# Patient Record
Sex: Male | Born: 1935
Health system: Southern US, Community
[De-identification: ages and names within clinical notes are randomized; demographics above are authoritative.]

## PROBLEM LIST (undated history)

## (undated) DIAGNOSIS — K219 Gastro-esophageal reflux disease without esophagitis: Secondary | ICD-10-CM

## (undated) DIAGNOSIS — F32A Depression, unspecified: Secondary | ICD-10-CM

## (undated) DIAGNOSIS — E039 Hypothyroidism, unspecified: Secondary | ICD-10-CM

## (undated) DIAGNOSIS — H269 Unspecified cataract: Secondary | ICD-10-CM

## (undated) DIAGNOSIS — F329 Major depressive disorder, single episode, unspecified: Secondary | ICD-10-CM

## (undated) DIAGNOSIS — Z7189 Other specified counseling: Secondary | ICD-10-CM

## (undated) DIAGNOSIS — E785 Hyperlipidemia, unspecified: Secondary | ICD-10-CM

## (undated) DIAGNOSIS — C73 Malignant neoplasm of thyroid gland: Secondary | ICD-10-CM

## (undated) HISTORY — PX: OTHER SURGICAL HISTORY: SHX169

## (undated) HISTORY — DX: Major depressive disorder, single episode, unspecified: F32.9

## (undated) HISTORY — DX: Unspecified cataract: H26.9

## (undated) HISTORY — PX: COLONOSCOPY W/ POLYPECTOMY: SHX1380

## (undated) HISTORY — DX: Hyperlipidemia, unspecified: E78.5

## (undated) HISTORY — PX: CATARACT EXTRACTION, BILATERAL: SHX1313

## (undated) HISTORY — DX: Other specified counseling: Z71.89

## (undated) HISTORY — DX: Malignant neoplasm of thyroid gland: C73

---

## 1998-06-12 ENCOUNTER — Ambulatory Visit (HOSPITAL_COMMUNITY): Admission: RE | Admit: 1998-06-12 | Discharge: 1998-06-12 | Payer: Self-pay | Admitting: Internal Medicine

## 1999-12-08 HISTORY — PX: THYROIDECTOMY: SHX17

## 2000-02-02 ENCOUNTER — Ambulatory Visit (HOSPITAL_COMMUNITY): Admission: RE | Admit: 2000-02-02 | Discharge: 2000-02-02 | Payer: Self-pay | Admitting: Internal Medicine

## 2000-02-02 ENCOUNTER — Encounter: Payer: Self-pay | Admitting: Internal Medicine

## 2000-02-05 ENCOUNTER — Encounter (INDEPENDENT_AMBULATORY_CARE_PROVIDER_SITE_OTHER): Payer: Self-pay | Admitting: Specialist

## 2000-02-05 ENCOUNTER — Ambulatory Visit (HOSPITAL_COMMUNITY): Admission: RE | Admit: 2000-02-05 | Discharge: 2000-02-05 | Payer: Self-pay | Admitting: Internal Medicine

## 2000-02-05 ENCOUNTER — Encounter: Payer: Self-pay | Admitting: Internal Medicine

## 2000-02-20 ENCOUNTER — Ambulatory Visit (HOSPITAL_COMMUNITY): Admission: RE | Admit: 2000-02-20 | Discharge: 2000-02-20 | Payer: Self-pay

## 2000-02-24 ENCOUNTER — Encounter (INDEPENDENT_AMBULATORY_CARE_PROVIDER_SITE_OTHER): Payer: Self-pay | Admitting: Specialist

## 2000-02-25 ENCOUNTER — Inpatient Hospital Stay (HOSPITAL_COMMUNITY): Admission: AD | Admit: 2000-02-25 | Discharge: 2000-02-26 | Payer: Self-pay

## 2000-04-23 ENCOUNTER — Ambulatory Visit (HOSPITAL_COMMUNITY): Admission: RE | Admit: 2000-04-23 | Discharge: 2000-04-23 | Payer: Self-pay | Admitting: Internal Medicine

## 2000-04-23 ENCOUNTER — Encounter (HOSPITAL_BASED_OUTPATIENT_CLINIC_OR_DEPARTMENT_OTHER): Payer: Self-pay | Admitting: Internal Medicine

## 2000-04-27 ENCOUNTER — Encounter (HOSPITAL_BASED_OUTPATIENT_CLINIC_OR_DEPARTMENT_OTHER): Payer: Self-pay | Admitting: Internal Medicine

## 2000-04-27 ENCOUNTER — Ambulatory Visit (HOSPITAL_COMMUNITY): Admission: RE | Admit: 2000-04-27 | Discharge: 2000-04-27 | Payer: Self-pay | Admitting: Internal Medicine

## 2001-08-30 ENCOUNTER — Encounter (INDEPENDENT_AMBULATORY_CARE_PROVIDER_SITE_OTHER): Payer: Self-pay | Admitting: *Deleted

## 2001-08-30 ENCOUNTER — Ambulatory Visit (HOSPITAL_COMMUNITY): Admission: RE | Admit: 2001-08-30 | Discharge: 2001-08-30 | Payer: Self-pay | Admitting: Gastroenterology

## 2004-08-28 ENCOUNTER — Encounter: Payer: Self-pay | Admitting: Internal Medicine

## 2005-06-02 ENCOUNTER — Ambulatory Visit: Payer: Self-pay | Admitting: Internal Medicine

## 2005-06-05 ENCOUNTER — Ambulatory Visit: Payer: Self-pay | Admitting: Internal Medicine

## 2005-09-25 ENCOUNTER — Ambulatory Visit: Payer: Self-pay | Admitting: Internal Medicine

## 2005-10-09 ENCOUNTER — Ambulatory Visit: Payer: Self-pay | Admitting: Internal Medicine

## 2005-12-21 ENCOUNTER — Ambulatory Visit: Payer: Self-pay | Admitting: Internal Medicine

## 2006-06-14 ENCOUNTER — Ambulatory Visit: Payer: Self-pay | Admitting: Internal Medicine

## 2006-06-21 ENCOUNTER — Ambulatory Visit: Payer: Self-pay | Admitting: Internal Medicine

## 2006-12-13 ENCOUNTER — Ambulatory Visit: Payer: Self-pay | Admitting: Internal Medicine

## 2006-12-13 LAB — CONVERTED CEMR LAB
PSA: 1.16 ng/mL (ref 0.10–4.00)
TSH: 0.05 microintl units/mL — ABNORMAL LOW (ref 0.35–5.50)

## 2006-12-23 ENCOUNTER — Encounter: Payer: Self-pay | Admitting: Internal Medicine

## 2006-12-27 ENCOUNTER — Ambulatory Visit: Payer: Self-pay | Admitting: Internal Medicine

## 2007-06-22 ENCOUNTER — Ambulatory Visit: Payer: Self-pay | Admitting: Internal Medicine

## 2007-06-30 ENCOUNTER — Encounter (INDEPENDENT_AMBULATORY_CARE_PROVIDER_SITE_OTHER): Payer: Self-pay | Admitting: *Deleted

## 2007-06-30 LAB — CONVERTED CEMR LAB
ALT: 27 units/L (ref 0–53)
AST: 31 units/L (ref 0–37)
Cholesterol: 120 mg/dL (ref 0–200)
Glucose, Bld: 109 mg/dL — ABNORMAL HIGH (ref 70–99)
HDL: 35.9 mg/dL — ABNORMAL LOW (ref >39.0)
LDL Cholesterol: 68 mg/dL (ref 0–99)
TSH: 0.03 microintl units/mL — ABNORMAL LOW (ref 0.35–5.50)
Total CHOL/HDL Ratio: 3.3
Triglycerides: 83 mg/dL (ref 0–149)
VLDL: 17 mg/dL (ref 0–40)

## 2007-10-25 ENCOUNTER — Telehealth (INDEPENDENT_AMBULATORY_CARE_PROVIDER_SITE_OTHER): Payer: Self-pay | Admitting: *Deleted

## 2007-11-11 ENCOUNTER — Ambulatory Visit: Payer: Self-pay | Admitting: Internal Medicine

## 2007-11-13 LAB — CONVERTED CEMR LAB
ALT: 32 units/L (ref 0–53)
AST: 35 units/L (ref 0–37)
Glucose, Bld: 117 mg/dL — ABNORMAL HIGH (ref 70–99)
TSH: 0.05 microintl units/mL — ABNORMAL LOW (ref 0.35–5.50)

## 2007-11-14 ENCOUNTER — Encounter (INDEPENDENT_AMBULATORY_CARE_PROVIDER_SITE_OTHER): Payer: Self-pay | Admitting: *Deleted

## 2007-11-21 ENCOUNTER — Ambulatory Visit: Payer: Self-pay | Admitting: Internal Medicine

## 2007-11-21 DIAGNOSIS — C73 Malignant neoplasm of thyroid gland: Secondary | ICD-10-CM | POA: Insufficient documentation

## 2007-11-21 DIAGNOSIS — E785 Hyperlipidemia, unspecified: Secondary | ICD-10-CM | POA: Insufficient documentation

## 2008-03-05 ENCOUNTER — Telehealth: Payer: Self-pay | Admitting: Internal Medicine

## 2008-03-05 ENCOUNTER — Telehealth (INDEPENDENT_AMBULATORY_CARE_PROVIDER_SITE_OTHER): Payer: Self-pay | Admitting: *Deleted

## 2008-03-06 ENCOUNTER — Telehealth (INDEPENDENT_AMBULATORY_CARE_PROVIDER_SITE_OTHER): Payer: Self-pay | Admitting: *Deleted

## 2008-05-14 ENCOUNTER — Ambulatory Visit: Payer: Self-pay | Admitting: Internal Medicine

## 2008-05-14 LAB — CONVERTED CEMR LAB
Bilirubin Urine: NEGATIVE
Blood in Urine, dipstick: NEGATIVE
Glucose, Urine, Semiquant: NEGATIVE
Protein, U semiquant: NEGATIVE
Urobilinogen, UA: 0.2
pH: 5

## 2008-05-16 ENCOUNTER — Telehealth (INDEPENDENT_AMBULATORY_CARE_PROVIDER_SITE_OTHER): Payer: Self-pay | Admitting: *Deleted

## 2008-05-21 ENCOUNTER — Ambulatory Visit: Payer: Self-pay | Admitting: Internal Medicine

## 2008-05-21 LAB — CONVERTED CEMR LAB
AST: 26 units/L (ref 0–37)
Basophils Absolute: 0 10*3/uL (ref 0.0–0.1)
Basophils Relative: 0.8 % (ref 0.0–1.0)
Bilirubin, Direct: 0.1 mg/dL (ref 0.0–0.3)
Chloride: 105 meq/L (ref 96–112)
Cholesterol: 137 mg/dL (ref 0–200)
Creatinine, Ser: 0.9 mg/dL (ref 0.4–1.5)
Eosinophils Absolute: 0.2 10*3/uL (ref 0.0–0.7)
GFR calc non Af Amer: 88 mL/min
HDL goal, serum: 40 mg/dL
HDL: 33.5 mg/dL — ABNORMAL LOW (ref >39.0)
LDL Cholesterol: 83 mg/dL (ref 0–99)
MCHC: 34.6 g/dL (ref 30.0–36.0)
MCV: 93.9 fL (ref 78.0–100.0)
Neutrophils Relative %: 48.7 % (ref 43.0–77.0)
Platelets: 188 10*3/uL (ref 150–400)
Potassium: 4.4 meq/L (ref 3.5–5.1)
RDW: 12.3 % (ref 11.5–14.6)
Sodium: 143 meq/L (ref 135–145)
TSH: 0.02 microintl units/mL — ABNORMAL LOW (ref 0.35–5.50)
Total Bilirubin: 0.9 mg/dL (ref 0.3–1.2)
Triglycerides: 103 mg/dL (ref 0–149)
VLDL: 21 mg/dL (ref 0–40)

## 2008-06-19 ENCOUNTER — Telehealth (INDEPENDENT_AMBULATORY_CARE_PROVIDER_SITE_OTHER): Payer: Self-pay | Admitting: *Deleted

## 2008-07-23 ENCOUNTER — Ambulatory Visit: Payer: Self-pay | Admitting: Internal Medicine

## 2008-07-30 ENCOUNTER — Encounter (INDEPENDENT_AMBULATORY_CARE_PROVIDER_SITE_OTHER): Payer: Self-pay | Admitting: *Deleted

## 2008-07-30 LAB — CONVERTED CEMR LAB: Hgb A1c MFr Bld: 5.7 % (ref 4.6–6.0)

## 2008-12-28 ENCOUNTER — Encounter: Payer: Self-pay | Admitting: Internal Medicine

## 2008-12-28 ENCOUNTER — Ambulatory Visit: Payer: Self-pay | Admitting: Family Medicine

## 2009-01-14 ENCOUNTER — Encounter (INDEPENDENT_AMBULATORY_CARE_PROVIDER_SITE_OTHER): Payer: Self-pay | Admitting: *Deleted

## 2009-04-19 ENCOUNTER — Telehealth (INDEPENDENT_AMBULATORY_CARE_PROVIDER_SITE_OTHER): Payer: Self-pay | Admitting: *Deleted

## 2009-05-09 ENCOUNTER — Ambulatory Visit: Payer: Self-pay | Admitting: Internal Medicine

## 2009-05-09 LAB — CONVERTED CEMR LAB
Glucose, Urine, Semiquant: NEGATIVE
Protein, U semiquant: 30
Urobilinogen, UA: 0.2
WBC Urine, dipstick: NEGATIVE

## 2009-05-13 ENCOUNTER — Ambulatory Visit: Payer: Self-pay | Admitting: Internal Medicine

## 2009-05-13 ENCOUNTER — Encounter (INDEPENDENT_AMBULATORY_CARE_PROVIDER_SITE_OTHER): Payer: Self-pay | Admitting: *Deleted

## 2009-05-13 LAB — CONVERTED CEMR LAB: OCCULT 3: NEGATIVE

## 2009-05-15 LAB — CONVERTED CEMR LAB
Albumin: 4.1 g/dL (ref 3.5–5.2)
Alkaline Phosphatase: 31 units/L — ABNORMAL LOW (ref 39–117)
Basophils Absolute: 0 10*3/uL (ref 0.0–0.1)
Bilirubin, Direct: 0.1 mg/dL (ref 0.0–0.3)
Calcium: 9.3 mg/dL (ref 8.4–10.5)
GFR calc non Af Amer: 87.96 mL/min (ref >60)
HCT: 46.7 % (ref 39.0–52.0)
HDL: 35.7 mg/dL — ABNORMAL LOW (ref >39.00)
LDL Cholesterol: 79 mg/dL (ref 0–99)
Lymphs Abs: 1.8 10*3/uL (ref 0.7–4.0)
MCV: 95.5 fL (ref 78.0–100.0)
Monocytes Absolute: 0.6 10*3/uL (ref 0.1–1.0)
Platelets: 153 10*3/uL (ref 150.0–400.0)
RDW: 12.1 % (ref 11.5–14.6)
Sodium: 140 meq/L (ref 135–145)
Total CHOL/HDL Ratio: 4
Triglycerides: 149 mg/dL (ref 0.0–149.0)

## 2009-05-20 ENCOUNTER — Ambulatory Visit: Payer: Self-pay | Admitting: Internal Medicine

## 2009-05-20 ENCOUNTER — Telehealth: Payer: Self-pay | Admitting: Internal Medicine

## 2009-05-20 DIAGNOSIS — R9431 Abnormal electrocardiogram [ECG] [EKG]: Secondary | ICD-10-CM

## 2009-05-20 DIAGNOSIS — R7301 Impaired fasting glucose: Secondary | ICD-10-CM

## 2009-05-20 DIAGNOSIS — Z85850 Personal history of malignant neoplasm of thyroid: Secondary | ICD-10-CM

## 2009-05-20 DIAGNOSIS — Z86010 Personal history of colonic polyps: Secondary | ICD-10-CM

## 2009-05-21 ENCOUNTER — Encounter: Payer: Self-pay | Admitting: Internal Medicine

## 2009-05-22 ENCOUNTER — Ambulatory Visit: Payer: Self-pay | Admitting: Internal Medicine

## 2009-05-22 ENCOUNTER — Encounter (INDEPENDENT_AMBULATORY_CARE_PROVIDER_SITE_OTHER): Payer: Self-pay | Admitting: *Deleted

## 2009-05-27 ENCOUNTER — Encounter (INDEPENDENT_AMBULATORY_CARE_PROVIDER_SITE_OTHER): Payer: Self-pay | Admitting: *Deleted

## 2009-06-11 ENCOUNTER — Telehealth (INDEPENDENT_AMBULATORY_CARE_PROVIDER_SITE_OTHER): Payer: Self-pay | Admitting: *Deleted

## 2009-06-11 ENCOUNTER — Ambulatory Visit: Payer: Self-pay | Admitting: Internal Medicine

## 2009-06-12 ENCOUNTER — Encounter: Payer: Self-pay | Admitting: Cardiovascular Disease

## 2009-06-12 ENCOUNTER — Ambulatory Visit: Payer: Self-pay

## 2009-06-19 ENCOUNTER — Encounter (INDEPENDENT_AMBULATORY_CARE_PROVIDER_SITE_OTHER): Payer: Self-pay | Admitting: *Deleted

## 2009-07-10 ENCOUNTER — Ambulatory Visit: Payer: Self-pay | Admitting: Internal Medicine

## 2009-10-22 ENCOUNTER — Ambulatory Visit: Payer: Self-pay | Admitting: Internal Medicine

## 2009-10-23 ENCOUNTER — Encounter: Payer: Self-pay | Admitting: Internal Medicine

## 2009-11-22 ENCOUNTER — Encounter: Payer: Self-pay | Admitting: Internal Medicine

## 2009-12-09 ENCOUNTER — Encounter: Payer: Self-pay | Admitting: Internal Medicine

## 2009-12-23 ENCOUNTER — Encounter: Payer: Self-pay | Admitting: Internal Medicine

## 2009-12-27 ENCOUNTER — Encounter: Payer: Self-pay | Admitting: Internal Medicine

## 2010-05-27 ENCOUNTER — Telehealth (INDEPENDENT_AMBULATORY_CARE_PROVIDER_SITE_OTHER): Payer: Self-pay | Admitting: *Deleted

## 2010-06-13 ENCOUNTER — Ambulatory Visit: Payer: Self-pay | Admitting: Internal Medicine

## 2010-06-13 LAB — CONVERTED CEMR LAB
ALT: 25 units/L (ref 0–53)
AST: 31 units/L (ref 0–37)
BUN: 18 mg/dL (ref 6–23)
Basophils Relative: 0.7 % (ref 0.0–3.0)
Bilirubin Urine: NEGATIVE
CO2: 25 meq/L (ref 19–32)
Chloride: 109 meq/L (ref 96–112)
Cholesterol: 182 mg/dL (ref 0–200)
Creatinine, Ser: 0.7 mg/dL (ref 0.4–1.5)
Eosinophils Absolute: 0.2 10*3/uL (ref 0.0–0.7)
Eosinophils Relative: 3.3 % (ref 0.0–5.0)
Glucose, Bld: 115 mg/dL — ABNORMAL HIGH (ref 70–99)
Glucose, Urine, Semiquant: NEGATIVE
Hemoglobin: 16.1 g/dL (ref 13.0–17.0)
Ketones, urine, test strip: NEGATIVE
Lymphocytes Relative: 38.2 % (ref 12.0–46.0)
MCHC: 34.3 g/dL (ref 30.0–36.0)
Monocytes Relative: 11.4 % (ref 3.0–12.0)
Neutro Abs: 2.3 10*3/uL (ref 1.4–7.7)
PSA: 1.22 ng/mL (ref 0.10–4.00)
RBC: 4.83 M/uL (ref 4.22–5.81)
Specific Gravity, Urine: 1.02
Total Protein: 6.8 g/dL (ref 6.0–8.3)
Triglycerides: 151 mg/dL — ABNORMAL HIGH (ref 0.0–149.0)
Urobilinogen, UA: 0.2
WBC: 5 10*3/uL (ref 4.5–10.5)

## 2010-06-17 ENCOUNTER — Encounter: Payer: Self-pay | Admitting: Internal Medicine

## 2010-06-18 ENCOUNTER — Encounter (INDEPENDENT_AMBULATORY_CARE_PROVIDER_SITE_OTHER): Payer: Self-pay | Admitting: *Deleted

## 2010-06-18 ENCOUNTER — Ambulatory Visit: Payer: Self-pay | Admitting: Internal Medicine

## 2010-06-18 LAB — CONVERTED CEMR LAB
OCCULT 1: NEGATIVE
OCCULT 3: NEGATIVE

## 2010-06-20 ENCOUNTER — Ambulatory Visit: Payer: Self-pay | Admitting: Internal Medicine

## 2011-01-08 NOTE — Progress Notes (Signed)
Summary: lab [phone]  Phone Note Call from Patient   Summary of Call: patient is scheduled for cpx [id number] - lab [phone] - need lab order .Marland KitchenOkey Regal Spring  May 27, 2010 9:34 AM   Follow-up for Phone Call         CBCD,BMP,HEP,LIPID,TSH,PSA,STOOL CARDS, U-DIP V70.0,272.4,995.20  Same as listed in phone note from 04/19/2009 (requesting labs order for CPX labs)  **Make sure patient is aware that if he only has MEDICARE he risk being charged for having labs prior to appointment** Follow-up by: Shonna Chock,  May 27, 2010 10:52 AM  Additional Follow-up for Phone Call Additional follow up Details #1::        labb order added to appt .Marland KitchenOkey Regal Spring  May 27, 2010 11:29 AM

## 2011-01-08 NOTE — Letter (Signed)
Summary: [facility]    Imported By: Lanelle Bal 01/06/2010 11:31:55  _____________________________________________________________________  External Attachment:    Type:   Image     Comment:   External Document

## 2011-01-08 NOTE — Assessment & Plan Note (Signed)
Summary: yearly check/cbs   Vital Signs:  Patient profile:   77 year old male Height:      70.75 inches Weight:      210 pounds BMI:     29.60 Temp:     98.3 degrees F oral Pulse rate:   68 / minute Resp:     14 per minute BP sitting:   122 / 74  (left arm) Cuff size:   large  Vitals Entered By: Shonna Chock CMA (June 20, 2010 2:06 PM) CC: Yearly, Lipid Management Comments **Patient refused to updat vaccines: TDap and Pneumonia was recommended**   CC:  Yearly and Lipid Management.  History of Present Illness: Here for Medicare AWV: 1.Risk factors based on Past M, S, F history:PMH of thyroid cancer;dyslipidemia. Chart updated & labs reviewed 2.Physical Activities: see Entry data 3.Depression/mood:denied  4.Hearing:whisper @ 6 ft 5.ADL's: no limitations 6.Fall Risk: none 7.Home Safety: no issues 8.Height, weight, &visual acuity:wall chart read w/o lenses; seen annually for unilateral Macular Degeneration 9.Counseling:none requested  10.Labs ordered based on risk factors: labs reviewed 11.Referral Coordination: none requested 12 Care Plan: see Instructions 13.Cognitive Assessment: oriented X 3, normal memory & recall; serial subtraction of 7 normal Hyperlipidemia Follow-Up      He also  presents for Hyperlipidemia follow-up.  Compliance with medications (by patient report) has been near 100%.  Dietary compliance has been good.  Adjunctive measures currently used by the patient include ASA.                                                                                                                                   Complete ROS for thyroid issues reviewed; PMH of thyroid cancer  Lipid Management History:      Positive NCEP/ATP III risk factors include male age 33 years old or older and HDL cholesterol less than 40.  Negative NCEP/ATP III risk factors include non-diabetic, no family history for ischemic heart disease, non-tobacco-user status, non-hypertensive, no ASHD  (atherosclerotic heart disease), no prior stroke/TIA, and no history of aortic aneurysm.     Preventive Screening-Counseling & Management  Alcohol-Tobacco     Alcohol drinks/day: 1-2     Alcohol type: wine     Smoking Status: quit > 6 months     Year Quit: 1963  Caffeine-Diet-Exercise     Caffeine use/day: 2-4 cups     Diet Comments: none     Does Patient Exercise: yes     Type of exercise: walking     Exercise (avg: min/session): >60     Times/week: <3  Hep-HIV-STD-Contraception     Dental Visit-last 6 months yes     Sun Exposure-Excessive: no  Safety-Violence-Falls     Seat Belt Use: yes     Firearms in the Home: firearms in the home     Firearm Counseling: he has been trained     Psychologist, sport and exercise: yes     Violence in  the Home: no risk noted     Sexual Abuse: no     Fall Risk: none      Sexual History:  currently monogamous.        Drug Use:  never.        Blood Transfusions:  no.        Travel History:  no foreign travel.    Allergies (verified): No Known Drug Allergies  Past History:  Past Medical History: elevated liver enzymes on viamin  A, PMH of   Thyroid Cancer  2001;Annual TSH goal = approx  0.2 for adequate suppression Annual thyroglolbulin antibodies Hyperlipidemia: NMR 08/2004: LDL 181(2753/2118), HDL 25,TG 166. LDL goal =< 100. Fr[place]ham Study LDL goal = < 130. Colonic polyps, hx of Abnormal EKG 2010; negative Nuclear Stress Test 06/2009  Past Surgical History: thyroid cancer, thyroidectomy 2001; S/P RAI fractures  of maxilla  post trauma(fall) colonoscopy polyps, tics09/2002, 08/2004; 2008 , Dr Kinnie Scales (due 2013) fracture of arm as child  Family History: Father: ? bladder cancer, CAD, angioplasty Mother: [place],HTN Siblings: bro: bladder tumor  Social History: Smoking Status:  quit > 6 months Caffeine use/day:  2-4 cups Dental Care w/in 6 mos.:  yes Sun Exposure-Excessive:  no Seat Belt Use:  yes Fall Risk:  none Blood  Transfusions:  no Drug Use:  never Sexual History:  currently monogamous  Review of Systems General:  Complains of sleep disorder; denies chills, fever, sweats, and weight loss; Some difficulty going to sleep. Sleep Hygiene reviewed.. Eyes:  Denies blurring, double vision, and vision loss-both eyes. ENT:  Denies difficulty swallowing, hoarseness, and sore throat. CV:  Denies chest pain or discomfort, leg cramps with exertion, palpitations, swelling of feet, and swelling of hands. Resp:  Denies cough, excessive snoring, hypersomnolence, morning headaches, shortness of breath, and sputum productive. GI:  Denies abdominal pain, bloody stools, dark tarry stools, and indigestion. GU:  Denies discharge, dysuria, and hematuria. MS:  Denies low back pain, mid back pain, and thoracic pain. Derm:  Denies changes in nail beds, dryness, and hair loss. Neuro:  Denies numbness and tingling. Psych:  Denies anxiety, depression, easily angered, easily tearful, and irritability. Endo:  Denies cold intolerance, excessive hunger, excessive thirst, excessive urination, and heat intolerance. Heme:  Denies abnormal bruising and bleeding. Allergy:  Denies itching eyes and sneezing.  Physical Exam  General:  Appears younger than age,well-nourished; alert,appropriate and cooperative throughout examination Head:  Normocephalic and atraumatic without obvious abnormalities. No apparent alopecia Eyes:  No corneal or conjunctival inflammation noted.No lid lag.Perrla. Funduscopic exam benign, without hemorrhages, exudates or papilledema. Vision grossly normal. Ears:  External ear exam shows no significant lesions or deformities.  Otoscopic examination reveals clear canals, tympanic membranes are intact bilaterally without bulging, retraction, inflammation or discharge. Hearing is grossly normal bilaterally. Nose:  External nasal examination shows no deformity or inflammation. Nasal mucosa are pink and moist without  lesions or exudates. Mouth:  Oral mucosa and oropharynx without lesions or exudates.  Teeth in good repair. Neck:  No deformities, masses, or tenderness noted.Thyroid surgically absent Lungs:  Normal respiratory effort, chest expands symmetrically. Lungs are clear to auscultation, no crackles or wheezes. Heart:  Normal rate and regular rhythm. S1 and S2 normal without gallop, murmur, click, rub .S4 Abdomen:  Bowel sounds positive,abdomen soft and non-tender without masses, organomegaly or hernias noted. Rectal:  No external abnormalities noted. Normal sphincter tone. No rectal masses or tenderness. Genitalia:  Testes bilaterally descended without nodularity, tenderness or masses. No scrotal masses  or lesions. No penis lesions or urethral discharge. L varicocele.   Prostate:  Prostate gland firm and smooth, no enlargement, nodularity, tenderness, mass, asymmetry or induration. Pulses:  R and L carotid,radial,dorsalis pedis and posterior tibial pulses are full and equal bilaterally Extremities:  No clubbing, cyanosis, edema, or deformity noted with normal full range of motion of all joints.   Neurologic:  alert & oriented X3, gait normal, and DTRs symmetrical and normal.   Skin:  Rash over buttocks Cervical Nodes:  No lymphadenopathy noted Axillary Nodes:  No palpable lymphadenopathy Psych:  memory intact for recent and remote, normally interactive, good eye contact, not anxious appearing, and not depressed appearing.     Impression & Recommendations:  Problem # 1:  PREVENTIVE HEALTH CARE (ICD-V70.0)  Orders: Subsequent annual wellness visit with prevention plan (V4259)  Problem # 2:  THYROID CANCER, HX OF (ICD-V10.87)  Problem # 3:  HYPERLIPIDEMIA (ICD-272.4)  His updated medication list for this problem includes:    Crestor 20 Mg Tabs (Rosuvastatin calcium) .Marland Kitchen... 1 qd  Orders: EKG w/ Interpretation (93000)  Problem # 4:  NONSPECIFIC ABNORMAL ELECTROCARDIOGRAM  (ICD-794.31)  Orders: EKG w/ Interpretation (93000): stable EKG ; clinically asymptomatic;negative Nuclear Stress Test,PMH of  Problem # 5:  COLONIC POLYPS, HX OF (ICD-V12.72) as per Dr Kinnie Scales  Complete Medication List: 1)  Synthroid 150 Mcg Tabs (Levothyroxine sodium) .Marland Kitchen.. 1 by mouth once daily except none on sat 2)  Basa  .... Qd 3)  Crestor 20 Mg Tabs (Rosuvastatin calcium) .Marland Kitchen.. 1 qd  Lipid Assessment/Plan:      Based on NCEP/ATP III, the patient's risk factor category is "0-1 risk factors".  The patient's lipid goals are as follows: Total cholesterol goal is 200; LDL cholesterol goal is 90; HDL cholesterol goal is 40; Triglyceride goal is 150.  His LDL cholesterol goal has been met.    Patient Instructions: 1)  It is important that you exercise regularly at least 20 minutes 5 times a week. If you develop chest pain, have severe difficulty breathing, or feel very tired , stop exercising immediately and seek medical attention.Take an Aspirin every day. Thyroglobulin antibodies need to be done annually with TSH. Prescriptions: CRESTOR 20 MG  TABS (ROSUVASTATIN CALCIUM) 1 qd  #90 x 3   Entered and Authorized by:   Marga Melnick MD   Signed by:   Marga Melnick MD on 06/20/2010   Method used:   Print then Give to Patient   RxID:   [id number] SYNTHROID 150 MCG  TABS (LEVOTHYROXINE SODIUM) 1 by mouth once daily EXCEPT none on Sat  #90 x 3   Entered and Authorized by:   Marga Melnick MD   Signed by:   Marga Melnick MD on 06/20/2010   Method used:   Print then Give to Patient   RxID:   [id number]

## 2011-01-08 NOTE — Letter (Signed)
Summary: [facility] [place]   Imported By: Lanelle Bal 12/19/2009 09:05:07  _____________________________________________________________________  External Attachment:    Type:   Image     Comment:   External Document

## 2011-01-08 NOTE — Progress Notes (Signed)
Summary: lab appt -[id number]  Phone Note Call from Patient Call back at Home Phone [phone]   Caller: Patient Summary of Call: need lab order - patient wife said patient gets labs once a year ---  Initial call taken by: Okey Regal Spring,  Apr 19, 2009 4:22 PM  Follow-up for Phone Call        ** PLEASE HAVE PATIENT SCHEDULE YEARLY CHECK-UP**  Lab Order: Dean Pruitt, U-DIP V70.0,272.4,995.20 Dean Pruitt  Apr 22, 2009 9:19 AM   Additional Follow-up for Phone Call Additional follow up Details #1::        when i spoke with wife i told her patient needed to schedule cpx -  she said no he just gets labs -  called to schedule lab appt spoke with patient he also said he didnt need cpx -  lab is scheduled [id number] - cpx [id number] Additional Follow-up by: Okey Regal Spring,  Apr 22, 2009 3:00 PM

## 2011-01-08 NOTE — Letter (Signed)
Summary: [facility] [place]   Imported By: Lanelle Bal 01/06/2010 11:32:42  _____________________________________________________________________  External Attachment:    Type:   Image     Comment:   External Document

## 2011-01-08 NOTE — Letter (Signed)
Summary: Results Follow up Letter  [facility] [place]  [address] [place], [place] [address]   Phone: [phone]  Fax: [phone]    06/18/2010 MRN: [id number]  Dean Pruitt [address] [place], [place]  [address]  Dear Dean Pruitt,  The following are the results of your recent test(s):  Test         Result    Pap Smear:        Normal _____  Not Normal _____ Comments: ______________________________________________________ Cholesterol: LDL(Bad cholesterol):         Your goal is less than:         HDL (Good cholesterol):       Your goal is more than: Comments:  ______________________________________________________ Mammogram:        Normal _____  Not Normal _____ Comments:  ___________________________________________________________________ Hemoccult:        Normal _X____  Not normal _______ Comments:    _____________________________________________________________________ Other Tests:    We routinely do not discuss normal results over the telephone.  If you desire a copy of the results, or you have any questions about this information we can discuss them at your next office visit.   Sincerely,

## 2011-04-24 NOTE — Discharge Summary (Signed)
[place]. [facility]  Patient:    Dean Pruitt                      MRN: [id number] Adm. Date:  [id number] Disc. Date: [id number] Attending:  Gennie Alma CC:         Titus Dubin. Alwyn Ren, M.D. [facility]             Robert E. Cheryll Cockayne, M.D.                           Discharge Summary  FINAL DIAGNOSES: 1. 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 2. Intraoperative devascularization of left superior parathyroid gland. 3. Hematoma of neck due to postoperative bleeding, postoperative complication.  HISTORY OF PRESENT ILLNESS:  This 77 year old male patient, who was in excellent health, recently had noticed a nodule on the left lobe of his thyroid when he was palpating his own neck.  He was seen by Dr. Alwyn Ren.  An ultrasound scan of the thyroid revealed two nodules of the left lobe of the thyroid, and radioactive scan revealed the nodules to be cold nodules.  They were biopsied by fine needle aspirate, and this revealed atypical cells compatible with papillary carcinoma of the thyroid.  Physical examination of the neck revealed a hard nodule in the left lobe of the thyroid in the lower pole that felt like it was approximately 1.5 cm in diameter.  HOSPITAL COURSE:  On the day of admission, the patient underwent a total thyroidectomy.  He also had an intraoperative devascularization of the left superior parathyroid gland in order to completely remove the left lobe of the thyroid.  The left superior parathyroid gland was autotransplanted to the left sternocleidomastoid muscle.  The patient tolerated the operation very well.  However, in the 24 hours postoperatively, he developed a large hematoma of his neck which increased in size and began to have airway obstruction.  He was taken back to the operating room emergently on February 25, 2000, for evacuation of a large neck hematoma and re-exploration of the thyroidectomy wound.  He made a dramatic  recovery after that procedure, and was able to be discharged on the next day, at which time there was no further bleeding, and the patient was feeling much better.  His voice was husky, but not hoarse.  He was discharged to further care as an outpatient.  CONDITION ON DISCHARGE:  Much improved.  MEDICATIONS: 1. Tylox 1 every six hours p.r.n. for pain. 2. Tums 500 q.i.d. 3. He also was given a prescription for Synthroid 0.125 mg daily.  FOLLOW-UP:  He was to return to our office on March 01, 2000, for further follow-up. DD:  03/26/00 TD:  03/27/00 Job: [id number]

## 2011-04-24 NOTE — Op Note (Signed)
[place]. [facility]  Patient:    Dean Pruitt, Dean Pruitt                      MRN: [id number] Proc. Date: 02/25/00 Adm. Date:  [id number] Disc. Date: [id number] Attending:  Gennie Alma CC:         Titus Dubin. Alwyn Ren, M.D. [facility]                           Operative Report  CCS# [id number]  PREOPERATIVE DIAGNOSIS:  Hematoma of neck wound, status post total thyroidectomy.  POSTOPERATIVE DIAGNOSIS:  Hematoma of neck wound, status post total thyroidectomy.  OPERATION PERFORMED:  Evacuation of hematoma of neck, re-exploration thyroidectomy wound.  SURGEON:  Milus Mallick, M.D.  ASSISTANT:  Dr. Ezzard Standing.  ANESTHESIA:  General endotracheal.  INDICATIONS FOR PROCEDURE:  This 77 year old male patient underwent a total thyroidectomy yesterday for a T3 carcinoma of the left lobe of the thyroid. The patient tolerated the surgery well and when seen on daily rounds this morning, he patient was complaining of difficulty in swallowing.  He had a mild to moderate  hematoma but no compromise of his breathing and appeared to be quite stable. Over the course of the morning and early afternoon, he developed swelling of his neck and began to have sensation of difficulty breathing.  He was therefore taken emergently to the operating room.  DESCRIPTION OF PROCEDURE:  The patient was brought to the operating room and his neck was prepared and draped.  He was kept in an upright position.  Under local  anesthesia with 1% Xylocaine with 1:200,000 parts of epinephrine, approximately  10 cc, the skin staples were removed from a low collar incision.  Next the sutures were removed from the platysma layer thereby exposing the middle cervical fascia. The middle cervical fascia was tense and straining to disrupt due to increased pressure in the visceral compartment of the neck.  The strap muscles were sutured in the midline.  These sutures were removed at which time, there was  an outpouring of clot and immediate relief of the patient in terms of his ability to breathe. At this point satisfactory general endotracheal anesthesia was initiated.  The patients neck was reprepared and draped in the usual fashion for thyroidectomy nd surgeon assistant scrubbed and gowned.  The strap muscles distracted and the left side was approached first.  Multiple clots were evacuated.  There was an area of active bleeding adjacent to the left side of the trachea and was probably a branch of the inferior thyroid artery.  It was clipped and controlled.  The left side f his neck was irrigated with sterile saline solution until clear.  Next, clots were evacuated from the right paratracheal space and there was no active bleeding on the right side. The neck was copiously irrigated and observed over 10 to 15 minute period and there was no bleeding that occurred.  A half-inch Penrose drain was bivalved and placed on either side of the trachea and brought out the center of the incision.  The strap muscles were approximated in the midline with interrupted sutures of 4-0 Vicryl.  The platysma layer was reapproximated with interrupted sutures of 4-0 Vicryl and the skin was reapproximated with the generic skin stapler 35-W.  Sterile dressings applied.  Estimated blood loss for this procedure was negligible but at least 200 to 300 c of clots were evacuated.  The patient  tolerated the procedure well and left the  operating room in satisfactory condition. DD:  02/25/00 TD:  02/26/00 Job: [id number]

## 2011-04-24 NOTE — Op Note (Signed)
[place]  Patient:    Dean Pruitt, Dean Pruitt                      MRN: [id number] Proc. Date: 02/24/00 Adm. Date:  [id number] Attending:  Gennie Alma CC:         Titus Dubin. Alwyn Ren, M.D. [facility]                           Operative Report  [facility] [place] NUMBER [phone]  PREOPERATIVE DIAGNOSIS:  Massive left lobe of thyroid, probable papillary carcinoma of thyroid.  POSTOPERATIVE DIAGNOSES: 1. Massive left lobe of thyroid, probable papillary carcinoma of thyroid. 2. Devascularized left superior parathyroid gland intraoperatively.  OPERATION:  Total thyroidectomy.  Auto transplantation of left superior parathyroid gland to the left sternocleidomastoid muscle.  SURGEON:  Milus Mallick, M.D.  ASSISTANT:  Rose Phi. Maple Hudson, M.D.  ANESTHESIA:  General endotracheal.  DESCRIPTION OF PROCEDURE:  Under adequate general endotracheal anesthesia, the patients neck was prepared and draped in the usual fashion after his neck was extended over a shoulder roll.  A low collar incision was made and carried down  through the platysma muscle. Superior and inferior platysmal flaps were developed exposing the middle cervical fascia.  A Mahorner self retaining retractor was inserted.  The middle cervical fascia was incised longitudinally in the midline and it was seen immediately that the patient had a large rock hard tumor in the left lobe of the thyroid that was invading the sternothyroid muscle.  The sternohyoid muscle was divided to expose the sternothyroid muscle.  The sternothyroid muscle then was divided above and below its area of attachment to the left lobe of the  thyroid.  The mass in the left lobe of the thyroid was approximately 3 cm in diameter.  Dissection was begun laterally.  The lower pole attachments of the left thyroid  lobe were divided over small hemoclips.  This allowed the lobe to be rotated medially and anteriorly.  The left  inferior parathyroid gland was identified and the dissection proceeded allowing the left inferior parathyroid gland to be vital. Next, the superior pole was approached and the superior pole vessels were controlled over ligatures of 2-0 silk and clip.  The stay side was left with a -0 silk ligature and a clip on it.  The specimen side had just a ligature on it. s we began to mobilize the left lobe of the thyroid, there was a very large area f adipose tissue that was attached to it that came up with it.  It was very unusual in appearance and we were concerned that it might have some lymph nodes in it.  When we explored it, we found a small normal appearing parathyroid gland.  This was devitalized however.  A small portion was sent for frozen section study which confirmed parathyroid tissue and the remainder was saved in saline.  The left recurrent laryngeal nerve was identified and the inferior thyroid artery was divided over small hemoclips taking care not to injure the recurrent laryngeal nerve.  The left lobe was then partially dissected off of the trachea but we found that the tumor appeared to be fairly adherent to the trachea in the superior portion.  For this reason, we then decided to do a total thyroidectomy and leave this portion of the thyroid dissection for the final maneuver when the area was  best visualized.  We then moved  to the right lobe of the thyroid and controlled the superior pole vessels with 2-0 silk ligature and clip.  The right inferior parathyroid gland was found also in a sea of adipose tissue and it was not devitalized.  The inferior pole attachments were divided over small clips.  The  right recurrent laryngeal nerve was identified and the lobe was dissected off of the trachea above it.  The right superior parathyroid gland was not definitely identified.  We then began the dissection of the entire thyroid gland off of the anterior aspect of the  trachea and this was done in some instances over small clips and then other instances just with Bovie electrocoagulation.  This was accomplished and the entire gland was removed from the operative field.  There was no evidence of tracheomalacia or injury of the anterior wall of the trachea that we could determine and it was completely clear of thyroid tissue at the completion of her surgery.  We saw no evidence of any enlarged lymph nodes.  The operative site was irrigated with sterile saline solution until clear.  A few small bleeding small veins were clipped with small hemoclips.  Hemostasis was ascertained.  The left sternohyoid muscle was repaired with interrupted horizontal mattress sutures of 4-0 Vicryl. It was then sutured to the right sternohyoid muscle with interrupted sutures of 4-0 Vicryl.  Next, the left superior parathyroid gland was diced into five small pieces which were inserted into an intramuscular pocket in the left sternocleidomastoid muscle. The pocket was closed with a figure-of-eight suture of 4-0 Vicryl.  The platysmal layer was reapproximated with interrupted sutures of 4-0 Vicryl and the skin was approximated with the generic skin stapler 35-W. Sterile dressing as applied.  Estimated blood loss for the procedure was approximately 200 cc.  The  patient tolerated the procedure well and left the operating room in satisfactory condition. DD:  02/24/00 TD:  02/25/00 Job: [id number]

## 2011-06-20 ENCOUNTER — Encounter: Payer: Self-pay | Admitting: Internal Medicine

## 2011-06-22 ENCOUNTER — Ambulatory Visit (INDEPENDENT_AMBULATORY_CARE_PROVIDER_SITE_OTHER): Payer: Medicare Other | Admitting: Internal Medicine

## 2011-06-22 ENCOUNTER — Encounter: Payer: Self-pay | Admitting: Internal Medicine

## 2011-06-22 VITALS — BP 126/78 | HR 57 | Temp 98.2°F | Resp 14 | Ht 71.25 in | Wt 202.2 lb

## 2011-06-22 DIAGNOSIS — Z85850 Personal history of malignant neoplasm of thyroid: Secondary | ICD-10-CM

## 2011-06-22 DIAGNOSIS — H3530 Unspecified macular degeneration: Secondary | ICD-10-CM

## 2011-06-22 DIAGNOSIS — E785 Hyperlipidemia, unspecified: Secondary | ICD-10-CM

## 2011-06-22 DIAGNOSIS — Z860100 Personal history of colon polyps, unspecified: Secondary | ICD-10-CM

## 2011-06-22 DIAGNOSIS — C73 Malignant neoplasm of thyroid gland: Secondary | ICD-10-CM

## 2011-06-22 DIAGNOSIS — R9431 Abnormal electrocardiogram [ECG] [EKG]: Secondary | ICD-10-CM

## 2011-06-22 DIAGNOSIS — Z0000 Encounter for general adult medical examination without abnormal findings: Secondary | ICD-10-CM

## 2011-06-22 LAB — BASIC METABOLIC PANEL WITH GFR
BUN: 20 mg/dL (ref 6–23)
CO2: 28 meq/L (ref 19–32)
Calcium: 9.3 mg/dL (ref 8.4–10.5)
Chloride: 100 meq/L (ref 96–112)
Creatinine, Ser: 0.9 mg/dL (ref 0.4–1.5)
GFR: 86.34 mL/min
Glucose, Bld: 110 mg/dL — ABNORMAL HIGH (ref 70–99)
Potassium: 4.5 meq/L (ref 3.5–5.1)
Sodium: 137 meq/L (ref 135–145)

## 2011-06-22 LAB — CBC WITH DIFFERENTIAL/PLATELET
Basophils Absolute: 0 10*3/uL (ref 0.0–0.1)
Eosinophils Absolute: 0.1 10*3/uL (ref 0.0–0.7)
Lymphocytes Relative: 32.7 % (ref 12.0–46.0)
MCHC: 33.9 g/dL (ref 30.0–36.0)
Neutrophils Relative %: 52.3 % (ref 43.0–77.0)
Platelets: 176 10*3/uL (ref 150.0–400.0)
RDW: 13.2 % (ref 11.5–14.6)

## 2011-06-22 LAB — HEPATIC FUNCTION PANEL
Alkaline Phosphatase: 42 U/L (ref 39–117)
Bilirubin, Direct: 0.1 mg/dL (ref 0.0–0.3)
Total Bilirubin: 1.5 mg/dL — ABNORMAL HIGH (ref 0.3–1.2)

## 2011-06-22 LAB — LIPID PANEL: Total CHOL/HDL Ratio: 4

## 2011-06-22 LAB — TSH: TSH: 0.14 u[IU]/mL — ABNORMAL LOW (ref 0.35–5.50)

## 2011-06-22 NOTE — Progress Notes (Signed)
Subjective:    Patient ID: Dean Pruitt, male    DOB: 15-May-1934, 77 y.o.   MRN: [id number]  HPI Medicare Wellness Visit:  The following psychosocial & medical history were reviewed as required by Medicare.   Social history: caffeine: 2 cups/ day , alcohol:  1 wine/ day ,  tobacco use : quit 1960  & exercise :2-3X / week < 30 min.   Home & personal  safety / fall risk:no issues , activities of daily living: no limitations , seatbelt use : yes , and smoke alarm employment : yes .  Power of Attorney/Living Will status : yes  Vision ( as recorded per Nurse) & Hearing  evaluation :  Wall chart read @ 6 ft;hearing slightly reduced to whisper on L. Orientation :oriented X3 , memory & recall :normal, spelling or math testing: good,and mood & affect : normal . Depression / anxiety: denied Travel history : [place] , immunization status :shingles needed , transfusion history:  no, and preventive health surveillance ( colonoscopies, BMD , etc as per protocol/ SOC): up to date, Dental care:  annually . Chart reviewed &  Updated. Active issues reviewed & addressed.       Review of Systems Patient reports no   hearing changes,anorexia, weight change, fever ,adenopathy, persistant / recurrent hoarseness, swallowing issues, chest pain,palpitations, edema,persistant / recurrent cough, hemoptysis, dyspnea(rest, exertional, paroxysmal nocturnal), gastrointestinal  bleeding (melena, rectal bleeding), abdominal pain, excessive heart burn, GU symptoms( dysuria, hematuria, pyuria, voiding/incontinence  issues) syncope, focal weakness, memory loss,numbness & tingling, skin/hair/nail changes, abnormal bruising/bleeding,or  musculoskeletal symptoms/signs.      Objective:   Physical Exam Gen.: Healthy and well-nourished in appearance. Alert, appropriate and cooperative throughout exam. Head: Normocephalic without obvious abnormalities;  no alopecia . Appears younger than age Eyes: No corneal or conjunctival  inflammation noted. Pupils equal round reactive to light and accommodation. Fundal exam is benign without hemorrhages, exudate, papilledema. Extraocular motion intact. Vision grossly normal. Ears: External  ear exam reveals no significant lesions or deformities. Canals clear .TMs normal. Hearing is grossly normal bilaterally. Nose: External nasal exam reveals no deformity or inflammation. Nasal mucosa are pink and moist. No lesions or exudates noted. Septum  :no deviation  Mouth: Oral mucosa and oropharynx reveal no lesions or exudates. Teeth in good repair. Neck: No deformities, masses, or tenderness noted. Range of motion normal. Thyroid  Surgically absent. Lungs: Normal respiratory effort; chest expands symmetrically. Lungs are clear to auscultation without rales, wheezes, or increased work of breathing. Heart:Slow rate and regular  rhythm. Normal S1 and S2. No gallop, click, or rub. S4 ; no murmur. Abdomen: Bowel sounds normal; abdomen soft and nontender. No masses, organomegaly or hernias noted. Genitalia/DRE: Varicocele suggested on the right. There is asymmetry of the prostate; the left lobe is small and the right is upper limits of normal. There is no induration or nodularity present.   .                                                                                   Musculoskeletal/extremities: No deformity or scoliosis noted of  the thoracic or lumbar spine. No clubbing, cyanosis, edema, or deformity  noted. Range of motion  normal .Tone & strength  normal.Joints normal. Nail health  good. Vascular: Carotid, radial artery, dorsalis pedis and  posterior tibial pulses are full and equal. No bruits present. Neurologic: Alert and oriented x3. Deep tendon reflexes symmetrical and normal.          Skin: Intact without suspicious lesions or rashes. Lymph: No cervical, axillary, or inguinal lymphadenopathy present. Psych: Mood and affect are normal. Normally interactive                                                                                          Assessment & Plan:  #1 Medicare Wellness Exam; criteria met ; data entered #2 Problem List reviewed ; Assessment/ Recommendations made Plan: see Orders

## 2011-06-22 NOTE — Patient Instructions (Signed)
Preventive Health Care: Exercise at least 30-45 minutes a day,  3-4 days a week.  Eat a low-fat diet with lots of fruits and vegetables, up to 7-9 servings per day. Consume less than 40 grams of sugar per day from foods & drinks with High Fructose Corn Sugar as #2,3 or # 4 on label.   

## 2011-06-23 LAB — HEMOGLOBIN A1C: Hgb A1c MFr Bld: 5.9 % (ref 4.6–6.5)

## 2011-06-23 LAB — THYROGLOBULIN LEVEL: Thyroglobulin: 3.8 ng/mL (ref 0.0–55.0)

## 2011-07-06 ENCOUNTER — Other Ambulatory Visit: Payer: Self-pay | Admitting: Internal Medicine

## 2011-07-06 MED ORDER — LEVOTHYROXINE SODIUM 150 MCG PO TABS: 150.0000 ug | ORAL_TABLET | Freq: Every day | ORAL | Status: DC

## 2011-07-09 ENCOUNTER — Other Ambulatory Visit: Payer: Self-pay | Admitting: *Deleted

## 2011-07-09 MED ORDER — ROSUVASTATIN CALCIUM 20 MG PO TABS: 20.0000 mg | ORAL_TABLET | Freq: Every day | ORAL | Status: DC

## 2011-09-03 ENCOUNTER — Telehealth: Payer: Self-pay | Admitting: Internal Medicine

## 2011-09-03 MED ORDER — DESONIDE 0.05 % EX LOTN: TOPICAL_LOTION | Freq: Two times a day (BID) | CUTANEOUS | Status: DC | PRN

## 2011-09-03 NOTE — Telephone Encounter (Signed)
Pt already made aware per Okey Regal that Rx sent in.

## 2011-09-03 NOTE — Telephone Encounter (Signed)
Patient has poison ivy - he wants rx for verdeso - [facility] - offered appt for this afternoon patient said he has too much to do today

## 2011-09-03 NOTE — Telephone Encounter (Signed)
Desonide lotion 0.05% 60 grams, apply bid prn

## 2011-11-23 ENCOUNTER — Other Ambulatory Visit: Payer: Self-pay | Admitting: Internal Medicine

## 2012-02-18 ENCOUNTER — Telehealth: Payer: Self-pay | Admitting: Internal Medicine

## 2012-02-18 MED ORDER — ZOLPIDEM TARTRATE 10 MG PO TABS: ORAL_TABLET | ORAL | Status: DC

## 2012-02-18 NOTE — Telephone Encounter (Signed)
#   10 , 1 every 3rd days prn only

## 2012-02-18 NOTE — Telephone Encounter (Signed)
Refill for Ambien 10MG  TAB [facility]O Last dispensed 7.15.11 No qty listed

## 2012-02-18 NOTE — Telephone Encounter (Signed)
Rx sent 

## 2012-02-18 NOTE — Telephone Encounter (Signed)
Dr.Hopper please advise, Ambien is not on med list (I checked centricity)

## 2012-06-22 ENCOUNTER — Encounter: Payer: Self-pay | Admitting: Internal Medicine

## 2012-06-22 ENCOUNTER — Ambulatory Visit (INDEPENDENT_AMBULATORY_CARE_PROVIDER_SITE_OTHER): Payer: Medicare Other | Admitting: Internal Medicine

## 2012-06-22 VITALS — BP 110/72 | HR 71 | Temp 97.8°F | Resp 12 | Ht 70.08 in | Wt 198.6 lb

## 2012-06-22 DIAGNOSIS — R9431 Abnormal electrocardiogram [ECG] [EKG]: Secondary | ICD-10-CM

## 2012-06-22 DIAGNOSIS — R7309 Other abnormal glucose: Secondary | ICD-10-CM

## 2012-06-22 DIAGNOSIS — Z86010 Personal history of colonic polyps: Secondary | ICD-10-CM

## 2012-06-22 DIAGNOSIS — Z85850 Personal history of malignant neoplasm of thyroid: Secondary | ICD-10-CM

## 2012-06-22 DIAGNOSIS — E785 Hyperlipidemia, unspecified: Secondary | ICD-10-CM

## 2012-06-22 DIAGNOSIS — Z0000 Encounter for general adult medical examination without abnormal findings: Secondary | ICD-10-CM

## 2012-06-22 LAB — HEPATIC FUNCTION PANEL
AST: 31 U/L (ref 0–37)
Albumin: 4.6 g/dL (ref 3.5–5.2)
Alkaline Phosphatase: 38 U/L — ABNORMAL LOW (ref 39–117)
Total Protein: 7.2 g/dL (ref 6.0–8.3)

## 2012-06-22 LAB — TSH: TSH: 0.07 u[IU]/mL — ABNORMAL LOW (ref 0.35–5.50)

## 2012-06-22 LAB — CBC WITH DIFFERENTIAL/PLATELET
Basophils Relative: 0.6 % (ref 0.0–3.0)
Eosinophils Absolute: 0.1 10*3/uL (ref 0.0–0.7)
HCT: 48.1 % (ref 39.0–52.0)
Hemoglobin: 15.9 g/dL (ref 13.0–17.0)
Lymphocytes Relative: 30.1 % (ref 12.0–46.0)
MCHC: 33 g/dL (ref 30.0–36.0)
Monocytes Relative: 12.1 % — ABNORMAL HIGH (ref 3.0–12.0)
Neutro Abs: 2.8 10*3/uL (ref 1.4–7.7)
RBC: 5.01 Mil/uL (ref 4.22–5.81)

## 2012-06-22 LAB — BASIC METABOLIC PANEL
CO2: 27 mEq/L (ref 19–32)
Calcium: 9.5 mg/dL (ref 8.4–10.5)
Potassium: 3.8 mEq/L (ref 3.5–5.1)
Sodium: 137 mEq/L (ref 135–145)

## 2012-06-22 LAB — HEMOGLOBIN A1C: Hgb A1c MFr Bld: 5.9 % (ref 4.6–6.5)

## 2012-06-22 NOTE — Patient Instructions (Addendum)
Preventive Health Care: Exercise at least 30-45 minutes a day,  3-4 days a week.  Eat a low-fat diet with lots of fruits and vegetables, up to 7-9 servings per day.  Consume less than 40 grams of sugar per day from foods & drinks with High Fructose Corn Sugar as #1,2,3 or # 4 on label. As per the Standard of Care , screening Colonoscopy recommended @ 50 & every 5-10 years thereafter . More frequent monitor would be dictated by family history or findings @ Colonoscopy . Please try to go on My Chart within the next 24 hours to allow me to release the results directly to you.

## 2012-06-22 NOTE — Progress Notes (Signed)
Subjective:    Patient ID: Dean Pruitt, male    DOB: [birth year], 78 y.o.   MRN: [id number]  HPI Medicare Wellness Visit:  The following psychosocial & medical history were reviewed as required by Medicare.   Social history: caffeine: 2-3 cups/ day of coffee , alcohol:  7 wine/ week ,  tobacco use : quit 1960  & exercise : yardwork ,golf.   Home & personal  safety / fall risk: no issues, activities of daily living: no limitations, seatbelt use : yes, and smoke alarm employment : yes .  Power of Attorney/Living Will status : in place  Vision ( as recorded per Nurse) & Hearing  evaluation : Ophth exam 7/15; cataract surgery 7/19.No hearing evaluation Orientation :oriented X 3 , memory & recall :good,  math testing: good,and mood & affect : normal . Depression / anxiety: denied Travel history : [place] 1999 , immunization status :Shingles needed , transfusion history:  no, and preventive health surveillance ( colonoscopies, BMD , etc as per protocol/ [facility]): colonoscopy ? Due 2013, Dental care:  Seen annually . Chart reviewed &  Updated. Active issues reviewed & addressed.      Review of Systems HYPERLIPIDEMIA: Medications: Compliance- yes Chest pain, palpitations-no       Dyspnea- no Lightheadedness,Syncope- no   Edema- no Abd pain, bowel changes-no   Muscle aches- no  FASTING HYPERGLYCEMIA, PMH of:  Polyuria/phagia/dipsia- no       Visual problems- only related to cataracts             Objective:   Physical Exam Gen.: Healthy and well-nourished in appearance. Alert, appropriate and cooperative throughout exam.Appears younger than stated age  Head: Normocephalic without obvious abnormalities  Eyes: No corneal or conjunctival inflammation noted. Pupils equal round reactive to light and accommodation. Fundal exam is benign without hemorrhages, exudate, papilledema. Extraocular motion intact. Vision grossly normal. Ears: External  ear exam reveals no significant lesions or  deformities. Canals clear .TMs normal. Hearing is grossly normal bilaterally. Nose: External nasal exam reveals no deformity or inflammation. Nasal mucosa are pink and moist. No lesions or exudates noted.   Mouth: Oral mucosa and oropharynx reveal no lesions or exudates. Teeth in good repair. Neck: No deformities, masses, or tenderness noted. Range of motion normal. Thyroid absent surgically. Lungs: Normal respiratory effort; chest expands symmetrically. Lungs are clear to auscultation without rales, wheezes, or increased work of breathing. Heart: Normal rate and rhythm. Normal S1 and S2. No gallop, click, or rub. S4 with slurring; no murmur. Abdomen: Bowel sounds normal; abdomen soft and nontender. No masses, organomegaly . Ventral  hernia noted. Genitalia/DRE: Genitalia normal except for left varices . Minimal asymmetry; right lobe slightly larger than the left. No nodules or induration present. Musculoskeletal/extremities: No deformity or scoliosis noted of  the thoracic or lumbar spine. No clubbing, cyanosis, edema, or deformity noted. Range of motion  normal .Tone & strength  normal.Joints normal. Nail health  good. Vascular: Carotid, radial artery, dorsalis pedis and  posterior tibial pulses are full and equal. No bruits present. Neurologic: Alert and oriented x3. Deep tendon reflexes symmetrical and normal.          Skin: Intact without suspicious lesions or rashes. Lymph: No cervical, axillary, or inguinal lymphadenopathy present. Psych: Mood and affect are normal. Normally interactive  Assessment & Plan:  #1 Medicare Wellness Exam; criteria met ; data entered #2 Problem List reviewed ; Assessment/ Recommendations made Plan: see Orders

## 2012-06-24 ENCOUNTER — Encounter: Payer: Self-pay | Admitting: Internal Medicine

## 2012-06-27 ENCOUNTER — Other Ambulatory Visit: Payer: Self-pay | Admitting: *Deleted

## 2012-06-27 MED ORDER — LEVOTHYROXINE SODIUM 150 MCG PO TABS: 150.0000 ug | ORAL_TABLET | Freq: Every day | ORAL | Status: DC

## 2012-06-27 MED ORDER — ROSUVASTATIN CALCIUM 20 MG PO TABS: 20.0000 mg | ORAL_TABLET | Freq: Every day | ORAL | Status: DC

## 2012-06-27 NOTE — Telephone Encounter (Signed)
Pt wife left VM stating that Pt just had his annual done and they access result on my chart and would now like to know how to go about getting refills on his medication.  Called Pt wife back and advise her that we normally ask that Pt contact there pharmacy so that they can send [place] a request. Pt wife states that it is not a refill request it is a new Rx. Tried to explain to Pt wife that it is a refill request because it is a continuation of med. Pt wife was very vocal that this is not what she needed Pt wife was not willing to listen to what was being explain to her and continue to talk and stating she would contact [facility] because it has never been done this way in the past so this must be something new like the my chart.  Pt wife was advise that Rx will be mailed once Alfonse Flavors has signed Rx.

## 2013-01-21 ENCOUNTER — Other Ambulatory Visit: Payer: Self-pay

## 2013-01-30 ENCOUNTER — Telehealth: Payer: Self-pay | Admitting: Internal Medicine

## 2013-01-30 MED ORDER — ZOLPIDEM TARTRATE 10 MG PO TABS: ORAL_TABLET | ORAL | Status: DC

## 2013-01-30 NOTE — Telephone Encounter (Signed)
[facility] Ambien is not currently on medication list, although previously rx'ed, please advise

## 2013-01-30 NOTE — Telephone Encounter (Signed)
refill Ambien 10MG  #10 take one every 3-days PRN, last fill 3.14.13

## 2013-01-30 NOTE — Telephone Encounter (Signed)
RX manually faxed  

## 2013-01-30 NOTE — Telephone Encounter (Signed)
OK # 10 , 1 q 3rd night prn only

## 2013-06-23 ENCOUNTER — Encounter: Payer: Self-pay | Admitting: Lab

## 2013-06-26 ENCOUNTER — Ambulatory Visit (INDEPENDENT_AMBULATORY_CARE_PROVIDER_SITE_OTHER): Payer: Medicare Other | Admitting: Internal Medicine

## 2013-06-26 ENCOUNTER — Encounter: Payer: Self-pay | Admitting: Internal Medicine

## 2013-06-26 VITALS — BP 126/88 | HR 61 | Temp 97.8°F | Resp 12 | Ht 71.0 in | Wt 207.0 lb

## 2013-06-26 DIAGNOSIS — E785 Hyperlipidemia, unspecified: Secondary | ICD-10-CM

## 2013-06-26 DIAGNOSIS — Z86010 Personal history of colonic polyps: Secondary | ICD-10-CM

## 2013-06-26 DIAGNOSIS — Z0000 Encounter for general adult medical examination without abnormal findings: Secondary | ICD-10-CM

## 2013-06-26 DIAGNOSIS — Z85850 Personal history of malignant neoplasm of thyroid: Secondary | ICD-10-CM

## 2013-06-26 LAB — LIPID PANEL
Cholesterol: 147 mg/dL (ref 0–200)
Triglycerides: 145 mg/dL (ref 0.0–149.0)

## 2013-06-26 LAB — CBC WITH DIFFERENTIAL/PLATELET
Basophils Relative: 0.4 % (ref 0.0–3.0)
Eosinophils Relative: 2.9 % (ref 0.0–5.0)
HCT: 47.5 % (ref 39.0–52.0)
Hemoglobin: 16 g/dL (ref 13.0–17.0)
Lymphs Abs: 2 10*3/uL (ref 0.7–4.0)
MCV: 98.1 fl (ref 78.0–100.0)
Monocytes Absolute: 0.7 10*3/uL (ref 0.1–1.0)
Neutro Abs: 2.4 10*3/uL (ref 1.4–7.7)
Platelets: 179 10*3/uL (ref 150.0–400.0)
RBC: 4.84 Mil/uL (ref 4.22–5.81)
WBC: 5.2 10*3/uL (ref 4.5–10.5)

## 2013-06-26 LAB — HEPATIC FUNCTION PANEL
ALT: 24 U/L (ref 0–53)
Albumin: 4.3 g/dL (ref 3.5–5.2)
Total Protein: 7 g/dL (ref 6.0–8.3)

## 2013-06-26 LAB — BASIC METABOLIC PANEL
BUN: 13 mg/dL (ref 6–23)
Chloride: 107 mEq/L (ref 96–112)
Potassium: 4.1 mEq/L (ref 3.5–5.1)

## 2013-06-26 MED ORDER — ROSUVASTATIN CALCIUM 20 MG PO TABS: 20.0000 mg | ORAL_TABLET | Freq: Every day | ORAL | Status: DC

## 2013-06-26 MED ORDER — ZOLPIDEM TARTRATE 10 MG PO TABS: ORAL_TABLET | ORAL | Status: DC

## 2013-06-26 MED ORDER — LEVOTHYROXINE SODIUM 150 MCG PO TABS: 150.0000 ug | ORAL_TABLET | Freq: Every day | ORAL | Status: DC

## 2013-06-26 NOTE — Patient Instructions (Addendum)
Preventive Health Care: Exercise at least 30-45 minutes a day,  3-4 days a week.  Eat a low-fat diet with lots of fruits and vegetables, up to 7-9 servings per day. This would eliminate the need for vitamin supplements. Consume less than 40 grams of sugar (preferably ZERO) per day from foods & drinks with High Fructose Corn Sugar as #1,2,3 or # 4 on label. Share results with all non [facility] medical staff seen  

## 2013-06-26 NOTE — Progress Notes (Signed)
Subjective:    Patient ID: Dean Pruitt, male    DOB: 1934-02-11, 79 y.o.   MRN: [id number]  HPI  Medicare Wellness Visit:  Psychosocial & medical history were reviewed as required by Medicare (abuse,antisocial behavioral risks,firearm risk).  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 Limitations of activities of daily living:no Seatbelt  and smoke alarm use:yes Power of Attorney/Living Will status : in place Ophthalmology exam status :current Hearing evaluation status:not current Orientation :oriented X 3 Memory & recall :good Spelling testing:good Active depression / anxiety:denied Foreign travel history : 2004 [place]  Immunization status for Shingles /Flu/ PNA/ tetanus : he does not take shots (risks discussed) Transfusion history:  no Preventive health surveillance status of colonoscopy as per protocol/ [facility]:[id number] Dental care:  Every 12 mos Chart reviewed &  Updated. Active issues reviewed & addressed.      Review of Systems He is on a modified  heart healthy diet; he exercises as noted above without symptoms. Specifically he denies chest pain, palpitations, dyspnea, or claudication. Family history is negative for premature coronary disease. Advanced cholesterol testing reveals his LDL goal is less than  100.     Objective:   Physical Exam Gen.: Healthy and well-nourished in appearance. Alert, appropriate and cooperative throughout exam.Appears younger than stated age  Head: Normocephalic without obvious abnormalities;  no alopecia  Eyes: No corneal or conjunctival inflammation noted. Pupils equal round reactive to light and accommodation. Fundal exam is benign without hemorrhages, exudate, papilledema. Extraocular motion intact. Vision grossly normal with lenses Ears: External  ear exam reveals no significant lesions or deformities. Canals  clear .TMs normal. Hearing is grossly normal bilaterally. Nose: External nasal exam reveals no deformity or inflammation. Nasal mucosa are pink and moist. No lesions or exudates noted.   Mouth: Oral mucosa and oropharynx reveal no lesions or exudates. Teeth in good repair. Neck: No deformities, masses, or tenderness noted. Range of motion normal. Thyroid surgically absent. Lungs: Normal respiratory effort; chest expands symmetrically. Lungs are clear to auscultation without rales, wheezes, or increased work of breathing. Heart: Normal rate and rhythm. Normal S1 and S2. No gallop, click, or rub. S4 w/o murmur. Abdomen: Bowel sounds normal; abdomen soft and nontender. No masses, organomegaly or hernias noted. Genitalia:deferred Musculoskeletal/extremities: No deformity or scoliosis noted of  the thoracic or lumbar spine.  No clubbing, cyanosis, edema, or significant extremity  deformity noted. Range of motion normal .Tone & strength  Normal. Joints normal . Nail health good. Able to lie down & sit up w/o help. Negative SLR bilaterally Vascular: Carotid, radial artery, dorsalis pedis and  posterior tibial pulses are full and equal. No bruits present. Neurologic: Alert and oriented x3. Deep tendon reflexes symmetrical and normal.  Gait normal  including heel & toe walking .        Skin: Intact without suspicious lesions or rashes. Lymph: No cervical, axillary, or inguinal lymphadenopathy present. Psych: Mood and affect are normal. Normally interactive  Assessment & Plan:  #1 Medicare Wellness Exam; criteria met ; data entered #2 Problem List/Diagnoses reviewed Plan:  Assessments made/ Orders entered  

## 2013-06-27 ENCOUNTER — Ambulatory Visit: Payer: Medicare Other

## 2013-06-27 DIAGNOSIS — R7309 Other abnormal glucose: Secondary | ICD-10-CM

## 2013-06-27 LAB — HEMOGLOBIN A1C: Hgb A1c MFr Bld: 5.8 % (ref 4.6–6.5)

## 2013-06-29 ENCOUNTER — Encounter: Payer: Self-pay | Admitting: Internal Medicine

## 2013-06-30 ENCOUNTER — Encounter: Payer: Self-pay | Admitting: Internal Medicine

## 2013-07-12 ENCOUNTER — Other Ambulatory Visit: Payer: Self-pay

## 2013-07-14 ENCOUNTER — Encounter: Payer: Self-pay | Admitting: Internal Medicine

## 2013-10-12 ENCOUNTER — Other Ambulatory Visit: Payer: Self-pay

## 2013-10-18 ENCOUNTER — Ambulatory Visit (INDEPENDENT_AMBULATORY_CARE_PROVIDER_SITE_OTHER): Payer: Medicare Other | Admitting: Nurse Practitioner

## 2013-10-18 ENCOUNTER — Encounter: Payer: Self-pay | Admitting: Nurse Practitioner

## 2013-10-18 VITALS — BP 120/80 | HR 69 | Temp 98.1°F | Ht 71.0 in | Wt 204.2 lb

## 2013-10-18 DIAGNOSIS — J029 Acute pharyngitis, unspecified: Secondary | ICD-10-CM

## 2013-10-18 NOTE — Progress Notes (Signed)
  Subjective:    Patient ID: Dean Pruitt, male    DOB: 08/27/1934, 79 y.o.   MRN: [id number]  Sore Throat  This is a new problem. The current episode started in the past 7 days (3d). The problem has been unchanged. The pain is worse on the right side. There has been no fever. The pain is moderate (worse at night). Associated symptoms include a hoarse voice. Pertinent negatives include no abdominal pain, congestion, coughing, diarrhea, ear pain, headaches, shortness of breath, swollen glands or trouble swallowing. He has had no exposure to strep. He has tried nothing for the symptoms.      Review of Systems  Constitutional: Negative for fever, activity change, appetite change and fatigue.  HENT: Positive for hoarse voice and voice change. Negative for congestion, ear pain, postnasal drip and trouble swallowing.   Eyes: Negative for redness.  Respiratory: Negative for cough, chest tightness, shortness of breath and wheezing.   Cardiovascular: Negative for chest pain.  Gastrointestinal: Negative for nausea, abdominal pain and diarrhea.  Musculoskeletal: Negative for back pain.  Neurological: Negative for headaches.  Hematological: Negative for adenopathy.       Objective:   Physical Exam  Vitals reviewed. Constitutional: He is oriented to person, place, and time. He appears well-developed and well-nourished.  HENT:  Head: Normocephalic and atraumatic.  Right Ear: External ear normal. Tympanic membrane is retracted. Tympanic membrane is not injected and not erythematous. A middle ear effusion is present.  Left Ear: External ear normal. Tympanic membrane is retracted. Tympanic membrane is not injected and not erythematous. A middle ear effusion is present.  Mouth/Throat: Posterior oropharyngeal erythema (petechiae  posterior soft palate) present. No oropharyngeal exudate or posterior oropharyngeal edema.  Eyes: Conjunctivae are normal. Right eye exhibits no discharge. Left eye  exhibits no discharge.  Neck: Normal range of motion. Neck supple. No thyromegaly present.  Cardiovascular: Normal rate, regular rhythm and normal heart sounds.   No murmur heard. Pulmonary/Chest: Effort normal and breath sounds normal. No respiratory distress. He has no wheezes.  Lymphadenopathy:    He has no cervical adenopathy.  Neurological: He is alert and oriented to person, place, and time.  Skin: Skin is warm and dry.  Psychiatric: He has a normal mood and affect. His behavior is normal. Thought content normal.          Assessment & Plan:  1. Sore throat Petechiae soft palate - POCT rapid strep A-neg - Upper Respiratory Culture-pending See pt instructions.

## 2013-10-18 NOTE — Progress Notes (Signed)
Pre-visit discussion using our clinic review tool. No additional management support is needed unless otherwise documented below in the visit note.  

## 2013-10-18 NOTE — Patient Instructions (Signed)
Our office will call you with throat culture results. I will call in antibiotic if you have a bacterial infection. In the meantime, you may use benzocaine throat lozenges for comfort. Pleasure to meet you!

## 2013-10-19 ENCOUNTER — Telehealth: Payer: Self-pay | Admitting: *Deleted

## 2013-10-19 NOTE — Telephone Encounter (Signed)
10/19/2013  Pt wife called to see if we have received any results from strep test done at his appt 10/18/2013 with Maximino Sarin.  Please advise.  bw

## 2013-10-21 LAB — CULTURE, UPPER RESPIRATORY: Organism ID, Bacteria: NORMAL

## 2013-10-27 NOTE — Telephone Encounter (Signed)
Pt was informed of throat culture results on 10-20-13.//AB/CMA

## 2014-03-06 ENCOUNTER — Telehealth: Payer: Self-pay | Admitting: *Deleted

## 2014-03-06 ENCOUNTER — Other Ambulatory Visit: Payer: Self-pay | Admitting: Internal Medicine

## 2014-03-06 MED ORDER — TAMSULOSIN HCL 0.4 MG PO CAPS: 0.4000 mg | ORAL_CAPSULE | Freq: Every day | ORAL | Status: DC

## 2014-03-06 NOTE — Telephone Encounter (Signed)
pts wife called requesting a Flomax refill.  Medication is not on med list.  Please advise

## 2014-03-06 NOTE — Telephone Encounter (Signed)
Who Rxed this & why? Prostate & BP monitor indicated if on this agent

## 2014-03-06 NOTE — Telephone Encounter (Signed)
Spoke with [name], pts wife, she states you, Dr Linna Darner, prescribed this medication to pt in 2013 for frequent urination.  Please advise

## 2014-03-06 NOTE — Telephone Encounter (Signed)
Rx sent to [facility]

## 2014-03-06 NOTE — Telephone Encounter (Signed)
Spoke with Dean Pruitt advised Rx sent

## 2014-03-15 ENCOUNTER — Other Ambulatory Visit: Payer: Self-pay | Admitting: Internal Medicine

## 2014-03-19 NOTE — Telephone Encounter (Signed)
1 every 3 rd night prn only #10

## 2014-03-19 NOTE — Telephone Encounter (Signed)
Last ov 06/26/13

## 2014-03-22 ENCOUNTER — Other Ambulatory Visit: Payer: Self-pay

## 2014-03-22 MED ORDER — LEVOTHYROXINE SODIUM 150 MCG PO TABS: 150.0000 ug | ORAL_TABLET | Freq: Every day | ORAL | Status: DC

## 2014-03-22 MED ORDER — ROSUVASTATIN CALCIUM 20 MG PO TABS: 20.0000 mg | ORAL_TABLET | Freq: Every day | ORAL | Status: DC

## 2014-05-15 ENCOUNTER — Other Ambulatory Visit: Payer: Self-pay

## 2014-05-15 MED ORDER — TAMSULOSIN HCL 0.4 MG PO CAPS: 0.4000 mg | ORAL_CAPSULE | Freq: Every day | ORAL | Status: DC

## 2014-06-28 ENCOUNTER — Encounter: Payer: Self-pay | Admitting: Internal Medicine

## 2014-06-28 ENCOUNTER — Encounter: Payer: Self-pay | Admitting: *Deleted

## 2014-06-28 ENCOUNTER — Other Ambulatory Visit (INDEPENDENT_AMBULATORY_CARE_PROVIDER_SITE_OTHER): Payer: Medicare Other

## 2014-06-28 ENCOUNTER — Ambulatory Visit (INDEPENDENT_AMBULATORY_CARE_PROVIDER_SITE_OTHER): Payer: Medicare Other | Admitting: Internal Medicine

## 2014-06-28 ENCOUNTER — Encounter: Payer: Medicare Other | Admitting: Internal Medicine

## 2014-06-28 VITALS — BP 150/96 | HR 61 | Temp 98.0°F | Ht 70.5 in | Wt 205.4 lb

## 2014-06-28 DIAGNOSIS — E785 Hyperlipidemia, unspecified: Secondary | ICD-10-CM

## 2014-06-28 DIAGNOSIS — R030 Elevated blood-pressure reading, without diagnosis of hypertension: Secondary | ICD-10-CM

## 2014-06-28 DIAGNOSIS — Z860100 Personal history of colon polyps, unspecified: Secondary | ICD-10-CM

## 2014-06-28 DIAGNOSIS — Z85850 Personal history of malignant neoplasm of thyroid: Secondary | ICD-10-CM

## 2014-06-28 DIAGNOSIS — R7309 Other abnormal glucose: Secondary | ICD-10-CM

## 2014-06-28 DIAGNOSIS — Z0000 Encounter for general adult medical examination without abnormal findings: Secondary | ICD-10-CM

## 2014-06-28 LAB — HEPATIC FUNCTION PANEL
ALT: 32 U/L (ref 0–53)
AST: 36 U/L (ref 0–37)
Albumin: 4.4 g/dL (ref 3.5–5.2)
Alkaline Phosphatase: 39 U/L (ref 39–117)
Bilirubin, Direct: 0.1 mg/dL (ref 0.0–0.3)
TOTAL PROTEIN: 7.1 g/dL (ref 6.0–8.3)
Total Bilirubin: 1.3 mg/dL — ABNORMAL HIGH (ref 0.2–1.2)

## 2014-06-28 LAB — TSH: TSH: 0.03 u[IU]/mL — ABNORMAL LOW (ref 0.35–4.50)

## 2014-06-28 LAB — LIPID PANEL
CHOLESTEROL: 151 mg/dL (ref 0–200)
HDL: 39.6 mg/dL (ref >39.00)
LDL Cholesterol: 83 mg/dL (ref 0–99)
NonHDL: 111.4
TRIGLYCERIDES: 144 mg/dL (ref 0.0–149.0)
Total CHOL/HDL Ratio: 4
VLDL: 28.8 mg/dL (ref 0.0–40.0)

## 2014-06-28 LAB — BASIC METABOLIC PANEL
BUN: 27 mg/dL — ABNORMAL HIGH (ref 6–23)
CALCIUM: 9.5 mg/dL (ref 8.4–10.5)
CO2: 28 meq/L (ref 19–32)
Chloride: 105 mEq/L (ref 96–112)
Creatinine, Ser: 0.8 mg/dL (ref 0.4–1.5)
GFR: 97.97 mL/min (ref >60.00)
Glucose, Bld: 104 mg/dL — ABNORMAL HIGH (ref 70–99)
Potassium: 4.7 mEq/L (ref 3.5–5.1)
Sodium: 139 mEq/L (ref 135–145)

## 2014-06-28 LAB — CBC WITH DIFFERENTIAL/PLATELET
BASOS PCT: 0.4 % (ref 0.0–3.0)
Basophils Absolute: 0 10*3/uL (ref 0.0–0.1)
EOS ABS: 0.2 10*3/uL (ref 0.0–0.7)
Eosinophils Relative: 3.2 % (ref 0.0–5.0)
HEMATOCRIT: 46.9 % (ref 39.0–52.0)
Hemoglobin: 16.1 g/dL (ref 13.0–17.0)
LYMPHS ABS: 2 10*3/uL (ref 0.7–4.0)
Lymphocytes Relative: 36.9 % (ref 12.0–46.0)
MCHC: 34.4 g/dL (ref 30.0–36.0)
MCV: 94 fl (ref 78.0–100.0)
MONO ABS: 0.6 10*3/uL (ref 0.1–1.0)
Monocytes Relative: 11.7 % (ref 3.0–12.0)
NEUTROS PCT: 47.8 % (ref 43.0–77.0)
Neutro Abs: 2.6 10*3/uL (ref 1.4–7.7)
Platelets: 167 10*3/uL (ref 150.0–400.0)
RBC: 4.99 Mil/uL (ref 4.22–5.81)
RDW: 12.9 % (ref 11.5–15.5)
WBC: 5.4 10*3/uL (ref 4.0–10.5)

## 2014-06-28 LAB — HEMOGLOBIN A1C: HEMOGLOBIN A1C: 5.8 % (ref 4.6–6.5)

## 2014-06-28 NOTE — Patient Instructions (Addendum)
Your next office appointment will be determined based upon review of your pending labs . Those instructions will be transmitted to you through My Chart.   Minimal Blood Pressure Goal= AVERAGE < 140/90;  Ideal is an AVERAGE < 135/85. This AVERAGE should be calculated from @ least 5-7 BP readings taken @ different times of day on different days of week. You should not respond to isolated BP readings , but rather the AVERAGE for that week .Please bring your  blood pressure cuff to office visits to verify that it is reliable.It  can also be checked against the blood pressure device at the pharmacy. Finger or wrist cuffs are not dependable; an arm cuff is.

## 2014-06-28 NOTE — Progress Notes (Signed)
Pre visit review using our clinic review tool, if applicable. No additional management support is needed unless otherwise documented below in the visit note. 

## 2014-06-28 NOTE — Assessment & Plan Note (Signed)
Lipids, LFTs, TSH  

## 2014-06-28 NOTE — Assessment & Plan Note (Signed)
TSH 

## 2014-06-28 NOTE — Assessment & Plan Note (Signed)
TSH; goal= < 0.35

## 2014-06-28 NOTE — Progress Notes (Signed)
Subjective:    Patient ID: Dean Pruitt, male    DOB: 11-25-1934, 80 y.o.   MRN: [id number]  HPIUHC/Medicare Wellness Visit: Psychosocial and medical history were reviewed as required by Medicare (history related to abuse, antisocial behavior , firearm risk). Social history: Caffeine: 1-2 cups /day , Alcohol:7 glasses  wine /week  , Tobacco HQP:RFFM 1960 Exercise:no regular program Personal safety/fall risk:no Limitations of activities of daily living:no Seatbelt/ smoke alarm use:yes Healthcare Power of Attorney/Living Will status: UTD Ophthalmologic exam status:UTD Hearing evaluation status:not UTD Orientation: Oriented X 3 Memory and recall: good Math testing: good Depression/anxiety assessment: no Foreign travel history: remotely [place] Immunization status for influenza/pneumonia/ shingles /tetanus:No shingles or Prevnar (I don't take any I don't need! SOC discussed) Transfusion history:no Preventive health care maintenance status: Colonoscopy as per protocol/standard care:UTD Dental care:every 12 mos Chart reviewed and updated. Active issues reviewed and addressed as documented below.    Review of Systems A heart healthy diet is followed; exercise only yard work  Family history is neg for premature coronary disease. Advanced cholesterol testing reveals  LDL goal is less than 100 ; ideally < 70 . There is medication compliance with the statin.  Full dose ASA taken Specifically denied are  chest pain, palpitations, dyspnea, or claudication.  Significant abdominal symptoms, memory deficit, or myalgias not present. He has BP cuff; no monitor.    Objective:   Physical Exam Gen.: Healthy and well-nourished in appearance. Alert, appropriate and cooperative throughout exam. Appears younger than stated age  Head: Normocephalic without obvious abnormalities;  noalopecia  Eyes: No corneal or conjunctival inflammation noted. Pupils equal round reactive to light and  accommodation. Extraocular motion intact. Ptosis OD Ears: External  ear exam reveals no significant lesions or deformities. Canals clear .TMs normal. Hearing is grossly normal bilaterally. Nose: External nasal exam reveals no deformity or inflammation. Nasal mucosa are pink and moist. No lesions or exudates noted.   Mouth: Oral mucosa and oropharynx reveal no lesions or exudates. Teeth in good repair. Neck: No deformities, masses, or tenderness noted. Range of motion normal. Thyroid absent. Lungs: Normal respiratory effort; chest expands symmetrically. Lungs are clear to auscultation without rales, wheezes, or increased work of breathing. Heart: Normal rate and rhythm. Normal S1; accentuated S2. No gallop, click, or rub. No murmur. Abdomen: Bowel sounds normal; abdomen soft and nontender. No masses, organomegaly or hernias noted. Genitalia: deferred due to age                   Musculoskeletal/extremities: No deformity or scoliosis noted of  the thoracic or lumbar spine. No clubbing, cyanosis, edema, or significant extremity  deformity noted. Range of motion normal .Tone & strength normal. Hand joints normal.  Fingernail health good. Able to lie down & sit up w/o help. Negative SLR bilaterally Vascular: Carotid, radial artery, dorsalis pedis and  posterior tibial pulses are full and equal. No bruits present. Neurologic: Alert and oriented x3. Deep tendon reflexes symmetrical and normal.  Gait normal .       Skin: Intact without suspicious lesions or rashes. Lymph: No cervical, axillary lymphadenopathy present. Psych: Mood and affect are normal. Normally interactive  Assessment & Plan:  See Current Assessment & Plan in Problem List under specific DiagnosisThe labs will be reviewed and risks and options assessed. Written recommendations will be provided by mail or directly through My Chart.Further  evaluation or change in medical therapy will be directed by those results.

## 2014-06-28 NOTE — Assessment & Plan Note (Signed)
CBC

## 2014-06-28 NOTE — Assessment & Plan Note (Signed)
A1c

## 2014-06-28 NOTE — Progress Notes (Signed)
This encounter was created in error - please disregard.

## 2014-06-28 NOTE — Progress Notes (Signed)
Patient here for annual wellness visit, patient reports: Risk Factors/Conditions needing evaluation or treatment: Pt does not have any new risk factors that need evaluation.  Home Safety: Pt lives at home with his wife, in a 2 story home.  Pt reports smoke alarms and no adaptive equipment.  Other Information: Corrective lens: Pt does not have corrective lens, has regular annual eye exams. Dentures: Pt does not have dentures, has regular annual dental exams. Memory: Pt denies memory problems.  Patient's Mini Mental Score (recorded in doc. flowsheet):  Bladder:  Pt denies problems with bladder control.  BMI/Exercise: Pt does not report regular exercise.    ADL/IADL:  Pt reports independence in all ADLS/functions. Balance/Gait: Pt reports 1 falls in the past year with no injury involved. We discussed home safety and fall prevention.     Annual Wellness Visit Requirements Recorded Today In  Medical, family, social history Past Medical, Family, Social History Section  Current providers Care team  Current medications Medications  Wt, BP, Ht, BMI Vital signs  Visual acuity (welcome visit) Not recorded  Hearing assessment (welcome visit) Not recorded  Tobacco, alcohol, illicit drug use History  ADL Nurse Assessment  Depression Screening Nurse Assessment  Cognitive impairment Document Flowsheet  Mini Mental Status Document Flowsheet  Fall Risk Fall/Depression  Home Safety Progress Note  End of Life Planning (welcome visit) Social Documentation  Medicare preventative services Health Maintenance  Risk factors/conditions needing evaluation/treatment Progress Note  Personalized health advice Patient Instructions, goals, letter  Diet & Exercise Social Documentation  Emergency Contact Social Documentation  Seat Belts Social Documentation  Sun exposure/protection Social Documentation   2

## 2014-06-29 LAB — THYROGLOBULIN ANTIBODY: Thyroglobulin Ab: <20 IU/mL (ref <40.0)

## 2014-06-30 ENCOUNTER — Other Ambulatory Visit: Payer: Self-pay | Admitting: Internal Medicine

## 2014-06-30 DIAGNOSIS — Z85850 Personal history of malignant neoplasm of thyroid: Secondary | ICD-10-CM

## 2014-07-14 ENCOUNTER — Other Ambulatory Visit: Payer: Self-pay | Admitting: Internal Medicine

## 2014-09-26 ENCOUNTER — Telehealth: Payer: Self-pay | Admitting: *Deleted

## 2014-09-26 ENCOUNTER — Other Ambulatory Visit: Payer: Self-pay | Admitting: Internal Medicine

## 2014-09-26 DIAGNOSIS — R350 Frequency of micturition: Secondary | ICD-10-CM

## 2014-09-26 NOTE — Telephone Encounter (Signed)
Called pt spoke with his wife gave her md response. Once referral has been set-up they will receive call with appt, date and time...Dean Pruitt

## 2014-09-26 NOTE — Telephone Encounter (Signed)
Left msg on triage stating husband been taking generic flomax, but med is not working for him. Wanting md to rx something else. Will be going on a cruise in abt 3 weeks and he don't want to be running to the bathroom so often...Johny Chess

## 2014-09-26 NOTE — Telephone Encounter (Signed)
Recommend Urology referral; other options very costly

## 2014-10-01 ENCOUNTER — Telehealth: Payer: Self-pay | Admitting: Internal Medicine

## 2014-10-01 NOTE — Telephone Encounter (Signed)
Notified pt wife with md response. Left in cabinet for pick-up...Johny Chess

## 2014-10-01 NOTE — Telephone Encounter (Signed)
He can pick up Myrbetriq samples  Flomax is generic, no coupons

## 2014-10-01 NOTE — Telephone Encounter (Signed)
Patient was unable to get appt w/urology until 11/07/14. His spouse would like to know if Dr. Linna Darner has any suggestions for the patient to get him through his cruise. She asked about samples/coupons for flomax. I advised her we no longer carry samples, however there may be coupons available.

## 2014-10-29 ENCOUNTER — Telehealth: Payer: Self-pay | Admitting: Internal Medicine

## 2014-10-29 MED ORDER — MIRABEGRON ER 25 MG PO TB24: 25.0000 mg | ORAL_TABLET | Freq: Every day | ORAL | Status: DC

## 2014-10-29 NOTE — Telephone Encounter (Signed)
Patient 's wife advised we do note have samples. She is requesting a 30 day supply be sent to [facility]

## 2014-10-29 NOTE — Telephone Encounter (Signed)
OK but he should price it before picking it up If expensive talk to Pharmacist as to options

## 2014-10-29 NOTE — Telephone Encounter (Signed)
Patient's wife called requesting more samples of Myrbetriq. She states the pt sees Dr. Gaynelle Arabian on 12/2. She is aware we no longer accept samples.  CB# [phone]

## 2014-10-29 NOTE — Telephone Encounter (Signed)
[facility]. Patient's wife has been notified

## 2014-10-31 ENCOUNTER — Telehealth: Payer: Self-pay

## 2014-10-31 NOTE — Telephone Encounter (Signed)
Last labs and office note have been faxed to [facility] for up coming appointment

## 2014-11-21 ENCOUNTER — Other Ambulatory Visit: Payer: Self-pay | Admitting: Internal Medicine

## 2014-11-21 NOTE — Telephone Encounter (Signed)
#  10 1 q 3rd night prn

## 2014-11-22 ENCOUNTER — Other Ambulatory Visit: Payer: Self-pay | Admitting: Internal Medicine

## 2014-11-22 NOTE — Telephone Encounter (Signed)
Zolpidem has been called to [facility] on [address]

## 2014-12-24 ENCOUNTER — Other Ambulatory Visit: Payer: Self-pay

## 2014-12-24 MED ORDER — MIRABEGRON ER 25 MG PO TB24: 25.0000 mg | ORAL_TABLET | Freq: Every day | ORAL | Status: DC

## 2014-12-31 ENCOUNTER — Telehealth: Payer: Self-pay | Admitting: Internal Medicine

## 2014-12-31 NOTE — Telephone Encounter (Signed)
Pt wife called in and said that the med that was given needs PA.  Just be on look at for it.     The med used for Bladder leakage.

## 2014-12-31 NOTE — Telephone Encounter (Signed)
I spoke to patient's wife to get updated insurance info. PA form has been submitted on cover my meds

## 2015-01-02 NOTE — Telephone Encounter (Signed)
Received a call from [facility] regarding prior authorization. Additional questions need to be answered. I called Colletta [place] back and left a message with our fax number so she can fax the information needed.

## 2015-01-03 NOTE — Telephone Encounter (Signed)
Did not receive a call back or fax from [place].  Butch Penny called today [phone]) and left a message for me to call back to answer additional questions. I called Butch Penny back and completed those questions. Still waiting on response.

## 2015-01-03 NOTE — Telephone Encounter (Signed)
Received a fax from patient's insurance stating Myrbetriq 25 mg has been approved effective 01/03/15 until 12/07/15

## 2015-04-29 ENCOUNTER — Other Ambulatory Visit: Payer: Self-pay | Admitting: Internal Medicine

## 2015-05-15 ENCOUNTER — Telehealth: Payer: Self-pay | Admitting: Internal Medicine

## 2015-05-15 ENCOUNTER — Other Ambulatory Visit: Payer: Self-pay

## 2015-05-15 MED ORDER — MIRABEGRON ER 25 MG PO TB24: 25.0000 mg | ORAL_TABLET | Freq: Every day | ORAL | Status: DC

## 2015-05-15 NOTE — Telephone Encounter (Signed)
Pt wife called in and said that she wanted the script printed and mailed to her because she is going to have to mail it to [place] to get it filled.  She [facility] not [facility]  .  50 mg Tab of the Myrbetriq    Best number [phone]

## 2015-05-15 NOTE — Telephone Encounter (Signed)
Patient's last office visit was 06/2014---you ok with refilling---please advise, thanks

## 2015-05-15 NOTE — Telephone Encounter (Signed)
#  90 but needs OV for any more

## 2015-05-15 NOTE — Telephone Encounter (Signed)
Would like script for myrbetriq mailed to them.  States she is going to send to [place] to get filled.

## 2015-05-15 NOTE — Telephone Encounter (Signed)
myrbetriq rx sent to [facility]

## 2015-05-16 ENCOUNTER — Other Ambulatory Visit: Payer: Self-pay

## 2015-05-16 NOTE — Telephone Encounter (Signed)
50 mg 1/2 qd #45

## 2015-05-16 NOTE — Telephone Encounter (Signed)
Left message for wife, [name], to call tamara back---i need to talk with patient about '50mg'$  myrbetriq she is requesting

## 2015-05-16 NOTE — Telephone Encounter (Signed)
Patients '25mg'$  myrbetriq is now costing over $300, she wants to know if you could prescribe '50mg'$  instead, so that patient can order thru mail order in [place] and 1/2 pills (equally '25mg'$ ) daily, so that cost would be better---patient states she can't afford med now, but med is only med that she has found to work well with her husband----please advise, i will call her---patient wants to pick up rx here at office, it is ok to leave message on her answering machine---thanks

## 2015-05-17 ENCOUNTER — Other Ambulatory Visit: Payer: Self-pay | Admitting: Emergency Medicine

## 2015-05-17 MED ORDER — ALFUZOSIN HCL ER 10 MG PO TB24: 10.0000 mg | ORAL_TABLET | Freq: Every day | ORAL | Status: DC

## 2015-05-17 NOTE — Telephone Encounter (Signed)
Looks like patient is willing to try something else, without going through mail order from [facility] advise with these 2 meds, thanks

## 2015-05-17 NOTE — Telephone Encounter (Signed)
Patient's wife, [name] has been advised that med sent to [facility]

## 2015-05-17 NOTE — Telephone Encounter (Signed)
Rx sent to [facility] per pt request

## 2015-05-17 NOTE — Telephone Encounter (Signed)
Patient's wife called back and insurance recommended Bethanechol and Alfuzosin. Pharmacy is [facility]  on [address].

## 2015-05-17 NOTE — Telephone Encounter (Signed)
Spoke to patient's wife. She is upset that the [facility]. She asks that it be sent to [facility]. [facility]. Please send and advise me. I will call her to advise.

## 2015-05-17 NOTE — Telephone Encounter (Signed)
Medication sent to pharmacy  

## 2015-06-03 ENCOUNTER — Other Ambulatory Visit: Payer: Self-pay

## 2015-06-04 ENCOUNTER — Telehealth: Payer: Self-pay | Admitting: Internal Medicine

## 2015-06-04 ENCOUNTER — Other Ambulatory Visit: Payer: Self-pay

## 2015-06-04 MED ORDER — ALFUZOSIN HCL ER 10 MG PO TB24: 10.0000 mg | ORAL_TABLET | Freq: Every day | ORAL | Status: DC

## 2015-06-04 NOTE — Telephone Encounter (Signed)
Patient need refill of Alfuzosin Hcl Er 10 mg

## 2015-06-04 NOTE — Telephone Encounter (Signed)
Pts last OV 7/15, last refill sent 06/16. Please advise

## 2015-06-04 NOTE — Telephone Encounter (Signed)
Refill 1 month Needs OV for refills

## 2015-07-01 ENCOUNTER — Encounter: Payer: Self-pay | Admitting: Internal Medicine

## 2015-07-01 ENCOUNTER — Other Ambulatory Visit (INDEPENDENT_AMBULATORY_CARE_PROVIDER_SITE_OTHER): Payer: PPO

## 2015-07-01 ENCOUNTER — Ambulatory Visit (INDEPENDENT_AMBULATORY_CARE_PROVIDER_SITE_OTHER): Payer: PPO | Admitting: Internal Medicine

## 2015-07-01 VITALS — BP 138/92 | HR 60 | Temp 97.8°F | Resp 18 | Ht 70.0 in | Wt 210.0 lb

## 2015-07-01 DIAGNOSIS — E785 Hyperlipidemia, unspecified: Secondary | ICD-10-CM

## 2015-07-01 DIAGNOSIS — R030 Elevated blood-pressure reading, without diagnosis of hypertension: Secondary | ICD-10-CM

## 2015-07-01 DIAGNOSIS — Z86010 Personal history of colonic polyps: Secondary | ICD-10-CM

## 2015-07-01 DIAGNOSIS — Z85850 Personal history of malignant neoplasm of thyroid: Secondary | ICD-10-CM

## 2015-07-01 DIAGNOSIS — R7301 Impaired fasting glucose: Secondary | ICD-10-CM

## 2015-07-01 LAB — BASIC METABOLIC PANEL
BUN: 14 mg/dL (ref 6–23)
CHLORIDE: 104 meq/L (ref 96–112)
CO2: 27 meq/L (ref 19–32)
Calcium: 9.5 mg/dL (ref 8.4–10.5)
Creatinine, Ser: 0.97 mg/dL (ref 0.40–1.50)
GFR: 79.36 mL/min (ref >60.00)
GLUCOSE: 111 mg/dL — AB (ref 70–99)
Potassium: 4.7 mEq/L (ref 3.5–5.1)
SODIUM: 139 meq/L (ref 135–145)

## 2015-07-01 LAB — CBC WITH DIFFERENTIAL/PLATELET
BASOS ABS: 0 10*3/uL (ref 0.0–0.1)
BASOS PCT: 0.7 % (ref 0.0–3.0)
Eosinophils Absolute: 0.1 10*3/uL (ref 0.0–0.7)
Eosinophils Relative: 2.3 % (ref 0.0–5.0)
HCT: 49.4 % (ref 39.0–52.0)
Hemoglobin: 16.8 g/dL (ref 13.0–17.0)
LYMPHS ABS: 1.8 10*3/uL (ref 0.7–4.0)
Lymphocytes Relative: 33.5 % (ref 12.0–46.0)
MCHC: 33.9 g/dL (ref 30.0–36.0)
MCV: 95.5 fl (ref 78.0–100.0)
Monocytes Absolute: 0.6 10*3/uL (ref 0.1–1.0)
Monocytes Relative: 11.1 % (ref 3.0–12.0)
NEUTROS ABS: 2.8 10*3/uL (ref 1.4–7.7)
NEUTROS PCT: 52.4 % (ref 43.0–77.0)
PLATELETS: 177 10*3/uL (ref 150.0–400.0)
RBC: 5.18 Mil/uL (ref 4.22–5.81)
RDW: 12.9 % (ref 11.5–15.5)
WBC: 5.4 10*3/uL (ref 4.0–10.5)

## 2015-07-01 LAB — HEPATIC FUNCTION PANEL
ALT: 29 U/L (ref 0–53)
AST: 33 U/L (ref 0–37)
Albumin: 4.3 g/dL (ref 3.5–5.2)
Alkaline Phosphatase: 37 U/L — ABNORMAL LOW (ref 39–117)
BILIRUBIN DIRECT: 0.2 mg/dL (ref 0.0–0.3)
BILIRUBIN TOTAL: 1 mg/dL (ref 0.2–1.2)
Total Protein: 6.5 g/dL (ref 6.0–8.3)

## 2015-07-01 LAB — LIPID PANEL
CHOLESTEROL: 161 mg/dL (ref 0–200)
HDL: 43.3 mg/dL (ref >39.00)
LDL CALC: 95 mg/dL (ref 0–99)
NONHDL: 117.7
Total CHOL/HDL Ratio: 4
Triglycerides: 115 mg/dL (ref 0.0–149.0)
VLDL: 23 mg/dL (ref 0.0–40.0)

## 2015-07-01 LAB — HEMOGLOBIN A1C: Hgb A1c MFr Bld: 5.7 % (ref 4.6–6.5)

## 2015-07-01 LAB — TSH: TSH: 2.03 u[IU]/mL (ref 0.35–4.50)

## 2015-07-01 MED ORDER — ROSUVASTATIN CALCIUM 20 MG PO TABS: 20.0000 mg | ORAL_TABLET | Freq: Every day | ORAL | Status: DC

## 2015-07-01 NOTE — Assessment & Plan Note (Signed)
CBC and differential

## 2015-07-01 NOTE — Assessment & Plan Note (Signed)
Blood pressure goals reviewed. BMET 

## 2015-07-01 NOTE — Progress Notes (Signed)
Pre visit review using our clinic review tool, if applicable. No additional management support is needed unless otherwise documented below in the visit note. 

## 2015-07-01 NOTE — Assessment & Plan Note (Signed)
A1c

## 2015-07-01 NOTE — Progress Notes (Signed)
   Subjective:    Patient ID: Dean Pruitt, male    DOB: 05-10-1934, 81 y.o.   MRN: [id number]  HPI The patient is here to assess status of active health conditions.  PMH, FH, & Social History reviewed & updated.No change in [place] as recorded.  He has been compliant with his medicines without adverse effects. He wishes to have generic Crestor Rxed because of cost. He is on modified heart healthy, low-salt diet. Usually he walks 3 miles per day 2-3 times per week if it is not excessively hot. He also plays golf & does yardwork. None of these activities are associated with cardiopulmonary symptoms.  He's taking his thyroid daily except none on Tuesdays and Thursdays. He states he does vary how he takes it intermittently.  Colonoscopy was performed 2014 by Dr. Earlean Shawl. He would be due 2019. He has no active GI symptoms.  He has macular degeneration and is followed annually by his ophthalmologist.  He saw urologist last year. Generic Uroxatral is as effective Myrbetriq but cost only $4 compared to over $300 a month.  He has nocturia on average once nightly or more . He also has cold intolerance.    Review of Systems  Chest pain, palpitations, tachycardia, exertional dyspnea, paroxysmal nocturnal dyspnea, claudication or edema are absent. No unexplained weight loss, abdominal pain, significant dyspepsia, dysphagia, melena, rectal bleeding, or persistently small caliber stools. Dysuria, pyuria, hematuria, frequency, or polyuria are denied. Change in hair, skin, nails denied. No bowel changes of constipation or diarrhea. No intolerance to heat     Objective:   Physical Exam  Pertinent or positive findings include: Pattern alopecia is present. Bilateral ptosis is noted. Tympanic membranes are dull. Thyroid is surgically absent. He has a small-moderately sized ventral hernia. He has mild crepitus in the knees. Genitourinary evaluation was deferred to Dr. Gaynelle Arabian.  General appearance  :adequately nourished; in no distress.  Eyes: No conjunctival inflammation or scleral icterus is present.  Oral exam:  Lips and gums are healthy appearing.There is no oropharyngeal erythema or exudate noted. Dental hygiene is good.  Heart:  Normal rate and regular rhythm. S1 and S2 normal without gallop, murmur, click, rub or other extra sounds    Lungs:Chest clear to auscultation; no wheezes, rhonchi,rales ,or rubs present.No increased work of breathing.   Abdomen: bowel sounds normal, soft and non-tender without masses, organomegaly or hernias noted.  No guarding or rebound.  Vascular : all pulses equal ; no bruits present.  Skin:Warm & dry.  Intact without suspicious lesions or rashes ; no tenting or jaundice   Lymphatic: No lymphadenopathy is noted about the head, neck, axilla   Neuro: Strength, tone & DTRs normal.        Assessment & Plan:  See Current Assessment & Plan in Problem List under specific Diagnosis

## 2015-07-01 NOTE — Assessment & Plan Note (Addendum)
He states that usually takes thyroid 1 pill except none on Tuesdays and Thursdays.  TSH and thyroglobulin antibodies

## 2015-07-01 NOTE — Patient Instructions (Signed)
  Your next office appointment will be determined based upon review of your pending labs  . Those written interpretation of the lab results and instructions will be transmitted to you by My Chart  Critical results will be called.   Followup as needed for any active or acute issue. Please report any significant change in your symptoms.  Minimal Blood Pressure Goal= AVERAGE < 140/90;  Ideal is an AVERAGE < 135/85. This AVERAGE should be calculated from @ least 5-7 BP readings taken @ different times of day on different days of week. You should not respond to isolated BP readings , but rather the AVERAGE for that week .Please bring your  blood pressure cuff to office visits to verify that it is reliable.It  can also be checked against the blood pressure device at the pharmacy. Finger or wrist cuffs are not dependable; an arm cuff is.  Colonoscopy due 2019

## 2015-07-01 NOTE — Assessment & Plan Note (Signed)
Lipids, LFTs, TSH   Generic Crestor

## 2015-07-02 ENCOUNTER — Other Ambulatory Visit: Payer: Self-pay | Admitting: Internal Medicine

## 2015-07-02 DIAGNOSIS — Z85850 Personal history of malignant neoplasm of thyroid: Secondary | ICD-10-CM

## 2015-07-02 LAB — THYROGLOBULIN ANTIBODY

## 2015-07-22 ENCOUNTER — Other Ambulatory Visit: Payer: Self-pay | Admitting: Emergency Medicine

## 2015-07-22 MED ORDER — ALFUZOSIN HCL ER 10 MG PO TB24: 10.0000 mg | ORAL_TABLET | Freq: Every day | ORAL | Status: DC

## 2015-07-30 ENCOUNTER — Telehealth: Payer: Self-pay | Admitting: *Deleted

## 2015-07-30 MED ORDER — ZOLPIDEM TARTRATE 10 MG PO TABS: ORAL_TABLET | ORAL | Status: DC

## 2015-07-30 NOTE — Telephone Encounter (Signed)
Left msg on triage stating [facility] needing a new rx for [facility]pidem [facility] [address]...Dean Pruitt

## 2015-07-30 NOTE — Telephone Encounter (Signed)
#  10; no refill 1 q 3rd night prn only

## 2015-07-30 NOTE — Telephone Encounter (Signed)
Called [facility] had to leave on pharmacy vm. notified pt wife med has been call in to [facility]...Dean Pruitt

## 2015-10-12 ENCOUNTER — Other Ambulatory Visit: Payer: Self-pay | Admitting: Internal Medicine

## 2015-10-14 ENCOUNTER — Other Ambulatory Visit: Payer: Self-pay | Admitting: Emergency Medicine

## 2015-10-14 MED ORDER — LEVOTHYROXINE SODIUM 150 MCG PO TABS: ORAL_TABLET | ORAL | Status: DC

## 2015-12-17 ENCOUNTER — Encounter: Payer: Self-pay | Admitting: Internal Medicine

## 2015-12-17 ENCOUNTER — Ambulatory Visit (INDEPENDENT_AMBULATORY_CARE_PROVIDER_SITE_OTHER): Payer: PPO | Admitting: Internal Medicine

## 2015-12-17 VITALS — BP 138/90 | HR 73 | Temp 97.8°F | Ht 70.0 in | Wt 211.0 lb

## 2015-12-17 DIAGNOSIS — R030 Elevated blood-pressure reading, without diagnosis of hypertension: Secondary | ICD-10-CM

## 2015-12-17 DIAGNOSIS — R7301 Impaired fasting glucose: Secondary | ICD-10-CM | POA: Diagnosis not present

## 2015-12-17 DIAGNOSIS — R05 Cough: Secondary | ICD-10-CM | POA: Insufficient documentation

## 2015-12-17 DIAGNOSIS — R059 Cough, unspecified: Secondary | ICD-10-CM | POA: Insufficient documentation

## 2015-12-17 MED ORDER — HYDROCODONE-HOMATROPINE 5-1.5 MG/5ML PO SYRP: 5.0000 mL | ORAL_SOLUTION | Freq: Four times a day (QID) | ORAL | Status: DC | PRN

## 2015-12-17 MED ORDER — LEVOFLOXACIN 250 MG PO TABS: 250.0000 mg | ORAL_TABLET | Freq: Every day | ORAL | Status: DC

## 2015-12-17 NOTE — Assessment & Plan Note (Signed)
stable overall by history and exam, recent data reviewed with pt, and pt to continue medical treatment as before,  to f/u any worsening symptoms or concerns Lab Results  Component Value Date   HGBA1C 5.7 07/01/2015

## 2015-12-17 NOTE — Patient Instructions (Addendum)
Please take all new medication as prescribed - the antibiotic, and cough medicine as needed  Please continue all other medications as before, and refills have been done if requested.  Please have the pharmacy call with any other refills you may need.  Please keep your appointments with your specialists as you may have planned  Please return in 3 months, or sooner if needed

## 2015-12-17 NOTE — Assessment & Plan Note (Signed)
Mild to mod, c/w sinusitis acute,m for antibx course,  to f/u any worsening symptoms or concerns

## 2015-12-17 NOTE — Assessment & Plan Note (Signed)
Borderline today, cont to monitor closely, BP Readings from Last 3 Encounters:  12/17/15 138/90  07/01/15 138/92  06/28/14 150/96

## 2015-12-17 NOTE — Progress Notes (Signed)
Subjective:    Patient ID: Dean Pruitt, male    DOB: 1934-03-19, 82 y.o.   MRN: [id number]  HPI  Here with 2-3 days acute onset fever, facial pain, pressure, headache, general weakness and malaise, and greenish d/c, with mild ST and cough, but pt denies chest pain, wheezing, increased sob or doe, orthopnea, PND, increased LE swelling, palpitations, dizziness or syncope.   Pt denies polydipsia, polyuria.  Pt denies new neurological symptoms such as new headache, or facial or extremity weakness or numbness Past Medical History  Diagnosis Date  . Thyroid cancer [facility])     PMH of; on supressive therapy  . Hyperlipidemia   . Cataract    Past Surgical History  Procedure Laterality Date  . Colon polyps  2002 & 2005    negative 2008; Dr Earlean Shawl  . Thyroidectomy  2001    S/P RAI  . Cataract extraction, bilateral  2013 & 2014    Dr Celene Squibb    reports that he quit smoking about 57 years ago. He has never used smokeless tobacco. He reports that he drinks about 4.2 oz of alcohol per week. He reports that he does not use illicit drugs. family history includes Alzheimer's disease in his mother; Cancer in his father; Heart disease in his father; Hypertension in his mother; Other in his brother. There is no history of Diabetes or Stroke. No Known Allergies Current Outpatient Prescriptions on File Prior to Visit  Medication Sig Dispense Refill  . alfuzosin (UROXATRAL) 10 MG 24 hr tablet Take 1 tablet (10 mg total) by mouth daily with breakfast. 90 tablet 1  . aspirin 325 MG tablet Take 81 mg by mouth daily.     Marland Kitchen levothyroxine (SYNTHROID, LEVOTHROID) 150 MCG tablet 07/02/15 : 1 qd EXCEPT 1/2 on Tues & Thurs 90 tablet 1  . rosuvastatin (CRESTOR) 20 MG tablet Take 1 tablet (20 mg total) by mouth daily. 90 tablet 3  . zolpidem (AMBIEN) 10 MG tablet TAKE 1 TABLET BY MOUTH EVERY 3RD NIGHT AS NEEDED 10 tablet 0  . tamsulosin (FLOMAX) 0.4 MG CAPS capsule Take 1 capsule (0.4 mg total) by mouth daily.  (Patient not taking: Reported on 12/17/2015) 90 capsule 0   No current facility-administered medications on file prior to visit.    Review of Systems  Constitutional: Negative for unusual diaphoresis or night sweats HENT: Negative for ringing in ear or discharge Eyes: Negative for double vision or worsening visual disturbance.  Respiratory: Negative for choking and stridor.   Gastrointestinal: Negative for vomiting or other signifcant bowel change Genitourinary: Negative for hematuria or change in urine volume.  Musculoskeletal: Negative for other MSK pain or swelling Skin: Negative for color change and worsening wound.  Neurological: Negative for tremors and numbness other than noted  Psychiatric/Behavioral: Negative for decreased concentration or agitation other than above       Objective:   Physical Exam BP 138/90 mmHg  Pulse 73  Temp(Src) 97.8 F (36.6 C) (Oral)  Ht '5\' 10"'$  (1.778 m)  Wt 211 lb (95.709 kg)  BMI 30.28 kg/m2  SpO2 96% VS noted, mild ill Constitutional: Pt appears in no significant distress HENT: Head: NCAT.  Right Ear: External ear normal.  Left Ear: External ear normal.  Bilat tm's with mild erythema.  Max sinus areas mild tender.  Pharynx with mild erythema, no exudate Eyes: . Pupils are equal, round, and reactive to light. Conjunctivae and EOM are normal Neck: Normal range of motion. Neck supple.  Cardiovascular: Normal  rate and regular rhythm.   Pulmonary/Chest: Effort normal and breath sounds without rales or wheezing.  Neurological: Pt is alert. Not confused , motor grossly intact Skin: Skin is warm. No rash, no LE edema Psychiatric: Pt behavior is normal. No agitation.     Assessment & Plan:

## 2015-12-17 NOTE — Progress Notes (Signed)
Pre visit review using our clinic review tool, if applicable. No additional management support is needed unless otherwise documented below in the visit note. 

## 2015-12-18 NOTE — Addendum Note (Signed)
Addended by: Biagio Borg on: 12/18/2015 08:46 PM   Modules accepted: Miquel Dunn

## 2015-12-31 ENCOUNTER — Ambulatory Visit (INDEPENDENT_AMBULATORY_CARE_PROVIDER_SITE_OTHER)
Admission: RE | Admit: 2015-12-31 | Discharge: 2015-12-31 | Disposition: A | Discharge status: Home / Self Care | Payer: PPO | Source: Ambulatory Visit | Attending: Internal Medicine | Admitting: Internal Medicine

## 2015-12-31 ENCOUNTER — Ambulatory Visit (INDEPENDENT_AMBULATORY_CARE_PROVIDER_SITE_OTHER): Payer: PPO | Admitting: Internal Medicine

## 2015-12-31 ENCOUNTER — Encounter: Payer: Self-pay | Admitting: Internal Medicine

## 2015-12-31 VITALS — BP 132/82 | HR 65 | Temp 97.8°F | Resp 20 | Wt 212.0 lb

## 2015-12-31 DIAGNOSIS — R7301 Impaired fasting glucose: Secondary | ICD-10-CM

## 2015-12-31 DIAGNOSIS — R05 Cough: Secondary | ICD-10-CM

## 2015-12-31 DIAGNOSIS — R059 Cough, unspecified: Secondary | ICD-10-CM

## 2015-12-31 DIAGNOSIS — R030 Elevated blood-pressure reading, without diagnosis of hypertension: Secondary | ICD-10-CM | POA: Diagnosis not present

## 2015-12-31 MED ORDER — HYDROCODONE-HOMATROPINE 5-1.5 MG/5ML PO SYRP: 5.0000 mL | ORAL_SOLUTION | Freq: Four times a day (QID) | ORAL | Status: DC | PRN

## 2015-12-31 MED ORDER — AZITHROMYCIN 250 MG PO TABS
ORAL_TABLET | ORAL | Status: DC
Start: 2015-12-31 — End: 2016-01-23

## 2015-12-31 NOTE — Progress Notes (Signed)
Subjective:    Patient ID: Dean Pruitt, male    DOB: 07-14-1934, 82 y.o.   MRN: [id number]  HPI  Here with acute onset mild to mod 2-3 days ST, HA, general weakness and malaise, with prod cough greenish sputum, but Pt denies chest pain, increased sob or doe, wheezing, orthopnea, PND, increased LE swelling, palpitations, dizziness or syncope.   Pt denies polydipsia, polyuria.  Pt denies new neurological symptoms such as new headache, or facial or extremity weakness or numbness Past Medical History  Diagnosis Date  . Thyroid cancer [facility])     PMH of; on supressive therapy  . Hyperlipidemia   . Cataract    Past Surgical History  Procedure Laterality Date  . Colon polyps  2002 & 2005    negative 2008; Dr Earlean Shawl  . Thyroidectomy  2001    S/P RAI  . Cataract extraction, bilateral  2013 & 2014    Dr Celene Squibb    reports that he quit smoking about 57 years ago. He has never used smokeless tobacco. He reports that he drinks about 4.2 oz of alcohol per week. He reports that he does not use illicit drugs. family history includes Alzheimer's disease in his mother; Cancer in his father; Heart disease in his father; Hypertension in his mother; Other in his brother. There is no history of Diabetes or Stroke. No Known Allergies Current Outpatient Prescriptions on File Prior to Visit  Medication Sig Dispense Refill  . alfuzosin (UROXATRAL) 10 MG 24 hr tablet Take 1 tablet (10 mg total) by mouth daily with breakfast. 90 tablet 1  . aspirin 325 MG tablet Take 81 mg by mouth daily.     Marland Kitchen levothyroxine (SYNTHROID, LEVOTHROID) 150 MCG tablet 07/02/15 : 1 qd EXCEPT 1/2 on Tues & Thurs 90 tablet 1  . omeprazole (PRILOSEC) 20 MG capsule Take 20 mg by mouth daily.    . rosuvastatin (CRESTOR) 20 MG tablet Take 1 tablet (20 mg total) by mouth daily. 90 tablet 3  . zolpidem (AMBIEN) 10 MG tablet TAKE 1 TABLET BY MOUTH EVERY 3RD NIGHT AS NEEDED 10 tablet 0   No current facility-administered medications on  file prior to visit.   Review of Systems  Constitutional: Negative for unusual diaphoresis or night sweats HENT: Negative for ringing in ear or discharge Eyes: Negative for double vision or worsening visual disturbance.  Respiratory: Negative for choking and stridor.   Gastrointestinal: Negative for vomiting or other signifcant bowel change Genitourinary: Negative for hematuria or change in urine volume.  Musculoskeletal: Negative for other MSK pain or swelling Skin: Negative for color change and worsening wound.  Neurological: Negative for tremors and numbness other than noted  Psychiatric/Behavioral: Negative for decreased concentration or agitation other than above       Objective:   Physical Exam BP 132/82 mmHg  Pulse 65  Temp(Src) 97.8 F (36.6 C) (Oral)  Resp 20  Wt 212 lb (96.163 kg)  SpO2 97% VS noted, mild ill Constitutional: Pt appears in no significant distress HENT: Head: NCAT.  Right Ear: External ear normal.  Left Ear: External ear normal.  Bilat tm's with mild erythema.  Max sinus areas non tender.  Pharynx with mild erythema, no exudate Eyes: . Pupils are equal, round, and reactive to light. Conjunctivae and EOM are normal Neck: Normal range of motion. Neck supple.  Cardiovascular: Normal rate and regular rhythm.   Pulmonary/Chest: Effort normal and breath sounds without rales or wheezing.  Neurological: Pt is alert. Not  confused , motor grossly intact Skin: Skin is warm. No rash, no LE edema Psychiatric: Pt behavior is normal. No agitation.     Assessment & Plan:

## 2015-12-31 NOTE — Progress Notes (Signed)
Pre visit review using our clinic review tool, if applicable. No additional management support is needed unless otherwise documented below in the visit note. 

## 2015-12-31 NOTE — Patient Instructions (Signed)
Please take all new medication as prescribed - the antibiotic  Please continue all other medications as before, including the cough medicine  Please have the pharmacy call with any other refills you may need..  Please keep your appointments with your specialists as you may have planned  Please go to the XRAY Department in the Basement (go straight as you get off the elevator) for the x-ray testing  You will be contacted by phone if any changes need to be made immediately.  Otherwise, you will receive a letter about your results with an explanation, but please check with MyChart first.  Please remember to sign up for MyChart if you have not done so, as this will be important to you in the future with finding out test results, communicating by private email, and scheduling acute appointments online when needed.

## 2016-01-04 NOTE — Assessment & Plan Note (Signed)
stable overall by history and exam, recent data reviewed with pt, and pt to continue medical treatment as before,  to f/u any worsening symptoms or concerns Lab Results  Component Value Date   HGBA1C 5.7 07/01/2015   Pt to call for onset polys or cbg > 200 with illness

## 2016-01-04 NOTE — Assessment & Plan Note (Addendum)
Mild to mod, c/w bronchitis vs pna, for cxr, for antibx course,  to f/u any worsening symptoms or concerns 

## 2016-01-04 NOTE — Assessment & Plan Note (Signed)
stable overall by history and exam, recent data reviewed with pt, and pt to continue medical treatment as before,  to f/u any worsening symptoms or concerns BP Readings from Last 3 Encounters:  12/31/15 132/82  12/17/15 138/90  07/01/15 138/92

## 2016-01-13 ENCOUNTER — Telehealth: Payer: Self-pay | Admitting: Internal Medicine

## 2016-01-13 NOTE — Telephone Encounter (Signed)
Please advise 

## 2016-01-13 NOTE — Telephone Encounter (Signed)
Pt's wife called stating Dr. Linna Darner had him on mirabegron ER (MYRBETRIQ) 25 MG TB24 tablet, then it became too expensive so he was put on another medication. She states its not working well and wants to go back on the Myrbetriq. She's wondering if there is a way to put him on 50 mg so he could maybe take every other day or split them in half to make them last longer.  Pharmacy is [facility] on [address]  Pt is aware Dr. Jenny Reichmann is out today and it may be a couple days

## 2016-01-15 ENCOUNTER — Telehealth: Payer: Self-pay | Admitting: Internal Medicine

## 2016-01-15 DIAGNOSIS — R413 Other amnesia: Secondary | ICD-10-CM

## 2016-01-15 MED ORDER — MIRABEGRON ER 50 MG PO TB24: ORAL_TABLET | ORAL | Status: DC

## 2016-01-15 NOTE — Telephone Encounter (Signed)
Please advise 

## 2016-01-15 NOTE — Telephone Encounter (Signed)
Pt's wife called stating he has been having some memory lapses and his mother died from Alzheimer's. She's wanting him to go to a neurologist and she was recommended to Dr. Willeen Niece. She was hoping you can send a referral over.  She's afraid if he come's to see you first he will back out of going to see her. Please advise

## 2016-01-15 NOTE — Telephone Encounter (Signed)
[place] for the 50 mg -   I am not sure if this can be taken as half tablet, I would consult pharmacy

## 2016-01-15 NOTE — Telephone Encounter (Signed)
Referral done

## 2016-01-16 NOTE — Telephone Encounter (Signed)
Pt informed

## 2016-01-23 ENCOUNTER — Telehealth: Payer: Self-pay | Admitting: *Deleted

## 2016-01-23 MED ORDER — AZITHROMYCIN 250 MG PO TABS: ORAL_TABLET | ORAL | Status: DC

## 2016-01-23 NOTE — Telephone Encounter (Signed)
Notified pt with md response.../lmb 

## 2016-01-23 NOTE — Telephone Encounter (Signed)
Left msg on triage stating husband had pneumonia last month, and don't want him to get it again. He still have some chest congestion and cough symptoms. Would like to have another Zpac...Johny Chess

## 2016-01-23 NOTE — Telephone Encounter (Signed)
Ok this time only, but would need to see if worsens again

## 2016-02-13 ENCOUNTER — Ambulatory Visit (INDEPENDENT_AMBULATORY_CARE_PROVIDER_SITE_OTHER): Payer: PPO | Admitting: Neurology

## 2016-02-13 ENCOUNTER — Encounter: Payer: Self-pay | Admitting: Neurology

## 2016-02-13 ENCOUNTER — Other Ambulatory Visit (INDEPENDENT_AMBULATORY_CARE_PROVIDER_SITE_OTHER): Payer: PPO

## 2016-02-13 VITALS — BP 102/72 | HR 95 | Ht 71.0 in | Wt 203.0 lb

## 2016-02-13 DIAGNOSIS — R4189 Other symptoms and signs involving cognitive functions and awareness: Secondary | ICD-10-CM | POA: Diagnosis not present

## 2016-02-13 LAB — VITAMIN B12: Vitamin B-12: 326 pg/mL (ref 211–911)

## 2016-02-13 NOTE — Progress Notes (Signed)
NEUROLOGY CONSULTATION NOTE  Dean Pruitt MRN: [id number] DOB: 11/03/1934  Referring provider: Dr. Jenny Reichmann Primary care provider: Dr. Jenny Reichmann  Reason for consult:  Memory problems  HISTORY OF PRESENT ILLNESS: Dean Pruitt is a 82 year old rightt-handed male with hyperlipidemia, elevated blood pressure and history of thyroid cancer s/p thyroidectomy who presents for memory deficits.  History obtained by patient and patient's wife.  His wife started noticing problems with memory about 6 or 7 months ago.  He recognizes memory problems too, but is not as concerned as his wife.  He started to become disoriented driving on familiar routes.  From church, he would be unsure how to get to known restaurants.  He had trouble driving to his daughter's house, as well.  Once in a while, hew would misplace things, such as his checkbook.  Over the span of a few months, he forgot to pay some pills three times.  He has since set up auto-payments.  This past year, he began to struggle with paying his taxes.  He sets up his pillbox for the week every week, which works out well.  He once saw a close friend from church whom he hasn't seen for several years.  He recognized him but couldn't remember his name.  Sleeping is poor.  He drinks 2 glasses of wine a day.  He does not exercise.  He denies depression, but he has some family stressors.  He has two adult children who are ill and he has a grandson with drug problems.   He is a Programmer, systems. His mother had Alzheimer's disease.   Labs from July:  TSH 2.03  PAST MEDICAL HISTORY: Past Medical History  Diagnosis Date  . Thyroid cancer [facility])     PMH of; on supressive therapy  . Hyperlipidemia   . Cataract     PAST SURGICAL HISTORY: Past Surgical History  Procedure Laterality Date  . Colon polyps  2002 & 2005    negative 2008; Dr Earlean Shawl  . Thyroidectomy  2001    S/P RAI  . Cataract extraction, bilateral  2013 & 2014    Dr Celene Squibb     MEDICATIONS: Current Outpatient Prescriptions on File Prior to Visit  Medication Sig Dispense Refill  . levothyroxine (SYNTHROID, LEVOTHROID) 150 MCG tablet 07/02/15 : 1 qd EXCEPT 1/2 on Tues & Thurs 90 tablet 1  . mirabegron ER (MYRBETRIQ) 50 MG TB24 tablet 1/2- 1 tab by mouth daily 90 tablet 3  . omeprazole (PRILOSEC) 20 MG capsule Take 20 mg by mouth daily.    . rosuvastatin (CRESTOR) 20 MG tablet Take 1 tablet (20 mg total) by mouth daily. 90 tablet 3  . zolpidem (AMBIEN) 10 MG tablet TAKE 1 TABLET BY MOUTH EVERY 3RD NIGHT AS NEEDED (Patient taking differently: Take 1/2 tablet as needed) 10 tablet 0   No current facility-administered medications on file prior to visit.    ALLERGIES: No Known Allergies  FAMILY HISTORY: Family History  Problem Relation Age of Onset  . Alzheimer's disease Mother   . Hypertension Mother   . Heart disease Father     CAD and angioplasty in 50s  . Cancer Father     Bladder  . Diabetes Neg Hx   . Stroke Neg Hx   . Other Brother     valvular heart disease    SOCIAL HISTORY: Drinks 2 glasses of wine daily He does not exercise  REVIEW OF SYSTEMS: Constitutional: No fevers, chills, or sweats, no  generalized fatigue, change in appetite Eyes: No visual changes, double vision, eye pain Ear, nose and throat: No hearing loss, ear pain, nasal congestion, sore throat Cardiovascular: No chest pain, palpitations Respiratory:  No shortness of breath at rest or with exertion, wheezes GastrointestinaI: No nausea, vomiting, diarrhea, abdominal pain, fecal incontinence Genitourinary:  No dysuria, urinary retention or frequency Musculoskeletal:  No neck pain, back pain Integumentary: No rash, pruritus, skin lesions Neurological: as above Psychiatric: No depression, insomnia, anxiety Endocrine: No palpitations, fatigue, diaphoresis, mood swings, change in appetite, change in weight, increased thirst Hematologic/Lymphatic:  No anemia, purpura,  petechiae. Allergic/Immunologic: no itchy/runny eyes, nasal congestion, recent allergic reactions, rashes  PHYSICAL EXAM: Filed Vitals:   02/13/16 1326  BP: 102/72  Pulse: 95   General: No acute distress.  Patient appears well-groomed.  Head:  Normocephalic/atraumatic Eyes:  fundi unremarkable, without vessel changes, exudates, hemorrhages or papilledema. Neck: supple, no paraspinal tenderness, full range of motion Back: No paraspinal tenderness Heart: regular rate and rhythm Lungs: Clear to auscultation bilaterally. Vascular: No carotid bruits. Neurological Exam: Mental status: alert and oriented to person, place, and time, delayed recall poor, remote memory intact, fund of knowledge intact, attention and concentration intact, speech fluent and not dysarthric, language intact.  Able to complete the trail making test but unable to correctly copy a cube or draw a clock. Montreal Cognitive Assessment  02/13/2016  Visuospatial/ Executive (0/5) 2  Naming (0/3) 3  Attention: Read list of digits (0/2) 2  Attention: Read list of letters (0/1) 1  Attention: Serial 7 subtraction starting at 100 (0/3) 3  Language: Repeat phrase (0/2) 2  Language : Fluency (0/1) 0  Abstraction (0/2) 0  Delayed Recall (0/5) 0  Orientation (0/6) 6  Total 19  Adjusted Score (based on education) 20   Cranial nerves: CN I: not tested CN II: pupils equal, round and reactive to light, visual fields intact, fundi unremarkable, without vessel changes, exudates, hemorrhages or papilledema. CN III, IV, VI:  full range of motion, no nystagmus, no ptosis CN V: facial sensation intact CN VII: upper and lower face symmetric CN VIII: hearing intact CN IX, X: gag intact, uvula midline CN XI: sternocleidomastoid and trapezius muscles intact CN XII: tongue midline Bulk & Tone: normal, no fasciculations. Motor:  5/5 throughout  Sensation: temperature and vibration sensation intact. Deep Tendon Reflexes:  2+ throughout,  toes downgoing.  Finger to nose testing:  Without dysmetria.  Heel to shin:  Without dysmetria.  Gait:  Normal station and stride.  Able to turn and tandem walk. Romberg negative.  IMPRESSION: Cognitive impairment, given the problems with managing finances and some disorientation while driving familiar routes, consider early-stage Alzheimer's  PLAN: 1.  Will check MRI of brain and B12 2.  If unremarkable, will send for formal neuropsychological testing 3.  Follow up after testing.  Thank you for allowing me to take part in the care of this patient.  Metta Clines, DO  CC:  Cathlean Cower, MD

## 2016-02-13 NOTE — Patient Instructions (Signed)
1.  First we will check B12 level and MRI of brain 2.  If unremarkable, will send you for neuropsychological testing 3.  Follow up after testing.

## 2016-02-14 ENCOUNTER — Telehealth: Payer: Self-pay

## 2016-02-14 NOTE — Telephone Encounter (Signed)
-----   Message from Pieter Partridge, DO sent at 02/13/2016  6:58 PM EST ----- B12 level is okay

## 2016-02-14 NOTE — Telephone Encounter (Signed)
B12 results sent to pt via Mychart.

## 2016-02-26 ENCOUNTER — Ambulatory Visit (HOSPITAL_COMMUNITY)
Admission: RE | Admit: 2016-02-26 | Discharge: 2016-02-26 | Disposition: A | Discharge status: Home / Self Care | Payer: PPO | Source: Ambulatory Visit | Attending: Neurology | Admitting: Neurology

## 2016-02-26 DIAGNOSIS — Z9889 Other specified postprocedural states: Secondary | ICD-10-CM | POA: Insufficient documentation

## 2016-02-26 DIAGNOSIS — R9089 Other abnormal findings on diagnostic imaging of central nervous system: Secondary | ICD-10-CM | POA: Diagnosis not present

## 2016-02-26 DIAGNOSIS — R4189 Other symptoms and signs involving cognitive functions and awareness: Secondary | ICD-10-CM | POA: Insufficient documentation

## 2016-02-26 DIAGNOSIS — I10 Essential (primary) hypertension: Secondary | ICD-10-CM | POA: Diagnosis not present

## 2016-02-27 ENCOUNTER — Telehealth: Payer: Self-pay

## 2016-02-27 DIAGNOSIS — R413 Other amnesia: Secondary | ICD-10-CM

## 2016-02-27 NOTE — Telephone Encounter (Signed)
-----   Message from Pieter Partridge, DO sent at 02/27/2016  7:31 AM EDT ----- MRI of brain is unrevealing overall for specific cause of cognitive problems.  I would recommend proceeding with neuropsychological testing, as we discussed.

## 2016-02-27 NOTE — Telephone Encounter (Signed)
Wife aware! Order placed. Added to list for neuropsy. Testing.

## 2016-03-13 ENCOUNTER — Telehealth: Payer: Self-pay | Admitting: Neurology

## 2016-03-13 NOTE — Telephone Encounter (Signed)
PT has a question in regards to an appointment with the neuro phycologist/Dawn [phone]

## 2016-03-13 NOTE — Telephone Encounter (Signed)
Wife aware that we have not yet started to schedule those exams.

## 2016-04-02 ENCOUNTER — Ambulatory Visit (INDEPENDENT_AMBULATORY_CARE_PROVIDER_SITE_OTHER): Payer: PPO | Admitting: Psychology

## 2016-04-02 DIAGNOSIS — R413 Other amnesia: Secondary | ICD-10-CM | POA: Diagnosis not present

## 2016-04-02 DIAGNOSIS — R4189 Other symptoms and signs involving cognitive functions and awareness: Secondary | ICD-10-CM | POA: Diagnosis not present

## 2016-04-02 NOTE — Progress Notes (Signed)
NEUROPSYCHOLOGICAL INTERVIEW (CPT: [id number])  Name: Dean Pruitt Date of Birth: March 14, 1934 Date of Interview: 04/02/2016  Reason for Referral:  Dean Pruitt is a 82 y.o. male who is referred for neuropsychological evaluation by Dr. Tomi Likens of [facility] due to memory concerns. This patient is accompanied in the office by his spouse who supplements the history.  History of Presenting Problem:  Dean Pruitt denied having any concerns about his memory or cognitive abilities. He reported that any changes he has noticed he attributes to normal aging. Meanwhile, his wife reported concerns about his memory. She reported forgetfulness for recent conversations and events. The patient admitted that this does happen. The patient and his wife denied language (expressive and receptive) deficits, inattention or distractibility. They denied executive dysfunction. They reported a change in his driving ability characterized by difficulty "judging distances".   The patient's wife reported she became concerned about the patient's memory in December 2016/January 2017. She noted that the patient was sick with pneumonia at that time. It took him over 4 weeks to recover. Once he recovered from the pneumonia, she felt his memory did improve.   His wife also noted that she and the patient have been under a great deal of stress for the past two years, as they have two adult children who have serious medical conditions. Additionally, they have a grandson with chronic substance use problems. The patient's wife thinks that he seems stressed and depressed. She reported that he is more easily aggravated. Dean Pruitt denies depression but admits that he does feel "disturbed" by his stressors frequently. He denied suicidal ideation or intention. No imminent risk of self-harm was identified.  Dean Pruitt reported poor sleep with chronic insomnia. He reported he does not take medication for sleep. He reported it takes  him a long time to fall asleep, probably because he has too much "whirling" in his mind.   An MRI of the brain was completed on 02/26/2016. Per the MRI report, there was no acute intracranial abnormality. Generalized cerebral volume loss, within normal limits for age, was noted. Mild for age nonspecific cerebral white matter signal changes were also noted.   Current Functioning: Dean Pruitt lives with his wife in their own home. He is retired (since 1999). He continues to drive but admitted to more difficulty with directions as well as trouble judging distances. He uses his GPS more often. He denied any problems with managing his medications. His wife noted that he does not take them as prescribed (reported that he is supposed to take them at different times throughout the day but that he takes them all at one time). Dean Pruitt denied any difficulty managing appointments. He also takes care of all the finances, including for their rental properties, and he does find this overwhelming.   Psychiatric History: Denied history of depression, anxiety, other MH disorder Denied history of MH treatment Denied history of substance dependence/treatment  Social History: Born/Raised: [employer] Education: Some college (approximately one year) Occupational history: Press photographer, retired 1999 Marital history: Married to his current wife for 22 years. The patient has two adult children from a previous marriage. Alcohol/Tobacco/Substances: He reported that he drinks a couple glasses of wine most nights. Denied history of heavy drinking. Denied substance abuse.  Medical History: Past Medical History  Diagnosis Date  . Thyroid cancer [facility])     PMH of; on supressive therapy  . Hyperlipidemia   . Cataract     Current Medications:  Outpatient Encounter Prescriptions  as of 04/02/2016  Medication Sig  . levothyroxine (SYNTHROID, LEVOTHROID) 150 MCG tablet 07/02/15 : 1 qd EXCEPT 1/2 on Tues & Thurs  . mirabegron  ER (MYRBETRIQ) 50 MG TB24 tablet 1/2- 1 tab by mouth daily  . omeprazole (PRILOSEC) 20 MG capsule Take 20 mg by mouth daily.  . rosuvastatin (CRESTOR) 20 MG tablet Take 1 tablet (20 mg total) by mouth daily.  Marland Kitchen zolpidem (AMBIEN) 10 MG tablet TAKE 1 TABLET BY MOUTH EVERY 3RD NIGHT AS NEEDED (Patient taking differently: Take 1/2 tablet as needed)   No facility-administered encounter medications on file as of 04/02/2016.    Behavioral Observations:   Appearance: Neat, appropriately dressed Gait: Ambulated independently, no gait abnormalities observed Speech: Fluent; normal rate, rhythm and volume Thought process: Linear, goal directed Affect: Full, euthymic, appropriate Task persistence: Good Interpersonal: Pleasant, appropriate Orientation: Oriented to person, place and most aspects of time; stated year as 2018  TESTING: There was medical necessity to proceed with neuropsychological assessment as the results will be used to aid in differential diagnosis and clinical decision-making and to inform specific treatment recommendations. Per the patient, his wife and medical records reviewed, there has been a change in cognitive functioning and a reasonable suspicion of dementia.  Following the clinical interview, the patient completed 75 minutes of neuropsychological testing.   Total face to face time spent in clinical interview: 30 minutes (CPT: [phone]) Total face to face time spent administering neuropsychological tests: 75 minutes  PLAN: The patient will be scheduled for a follow-up session with this provider at which time his test performances and my impressions and treatment recommendations will be reviewed in detail.   Full neuropsychological evaluation report to follow.

## 2016-04-14 ENCOUNTER — Ambulatory Visit (INDEPENDENT_AMBULATORY_CARE_PROVIDER_SITE_OTHER): Payer: PPO | Admitting: Psychology

## 2016-04-14 DIAGNOSIS — R413 Other amnesia: Secondary | ICD-10-CM

## 2016-04-14 DIAGNOSIS — F4323 Adjustment disorder with mixed anxiety and depressed mood: Secondary | ICD-10-CM

## 2016-04-14 NOTE — Progress Notes (Signed)
NEUROPSYCHOLOGICAL EVALUATION   Name:    Dean Pruitt  Date of Birth:   Feb 06, 1934 Date of Evaluation:  04/02/2016        Background Information:  Reason for Referral:  Dean Pruitt is a 81 y.o. male who is referred for neuropsychological evaluation by Dr. Tomi Likens of [facility] due to memory concerns. This patient is accompanied in the office by his spouse who supplements the history.  History of Presenting Problem:  Dean Pruitt denied having any concerns about his memory or cognitive abilities. He reported that any changes he has noticed he attributes to normal aging. Meanwhile, his wife reported concerns about his memory. She reported forgetfulness for recent conversations and events. The patient admitted that this does happen. The patient and his wife denied language (expressive and receptive) deficits, inattention or distractibility. They denied executive dysfunction. They reported a change in his driving ability characterized by difficulty "judging distances".   The patient's wife reported she became concerned about the patient's memory in December 2016/January 2017. She noted that the patient was sick with pneumonia at that time. It took him over 4 weeks to recover. Once he recovered from the pneumonia, she felt his memory did improve.   His wife also noted that she and the patient have been under a great deal of stress for the past two years, as they have two adult children who have serious medical conditions. Additionally, they have a grandson with chronic substance use problems. The patient's wife thinks that he seems stressed and depressed. She reported that he is more easily aggravated. Dean Pruitt denies depression but admits that he does feel "disturbed" by his stressors frequently. He denied suicidal ideation or intention. No imminent risk of self-harm was identified.  Dean Pruitt reported poor sleep with chronic insomnia. He reported he does not take  medication for sleep. He reported it takes him a long time to fall asleep, probably because he has too much "whirling" in his mind.   An MRI of the brain was completed on 02/26/2016. Per the MRI report, there was no acute intracranial abnormality. Generalized cerebral volume loss, within normal limits for age, was noted. Mild for age nonspecific cerebral white matter signal changes were also noted.  Current Functioning: Dean Pruitt lives with his wife in their own home. He is retired (since 1999). He continues to drive but admitted to more difficulty with directions as well as trouble judging distances. He uses his GPS more often. He denied any problems with managing his medications. His wife noted that he does not take them as prescribed (reported that he is supposed to take them at different times throughout the day but that he takes them all at one time). Dean Pruitt denied any difficulty managing appointments. He also takes care of all the finances, including for their rental properties, and he does find this overwhelming.   Psychiatric History: Denied history of depression, anxiety, other MH disorder Denied history of MH treatment Denied history of substance dependence/treatment  Medical History:  Past Medical History  Diagnosis Date  . Thyroid cancer [facility])     PMH of; on supressive therapy  . Hyperlipidemia   . Cataract    Current medications:  Outpatient Encounter Prescriptions as of 04/14/2016  Medication Sig  . levothyroxine (SYNTHROID, LEVOTHROID) 150 MCG tablet 07/02/15 : 1 qd EXCEPT 1/2 on Tues & Thurs  . mirabegron ER (MYRBETRIQ) 50 MG TB24 tablet 1/2- 1 tab by mouth daily  . omeprazole (PRILOSEC)  20 MG capsule Take 20 mg by mouth daily.  . rosuvastatin (CRESTOR) 20 MG tablet Take 1 tablet (20 mg total) by mouth daily.  Marland Kitchen zolpidem (AMBIEN) 10 MG tablet TAKE 1 TABLET BY MOUTH EVERY 3RD NIGHT AS NEEDED (Patient taking differently: Take 1/2 tablet as needed)   No  facility-administered encounter medications on file as of 04/14/2016.   Social History: Born/Raised: [employer] Education: Some college (approximately one year) Occupational history: Sales, retired 1999 Marital history: Married to his current wife for 56 years. The patient has two adult children from a previous marriage. Alcohol/Tobacco/Substances: He reported that he drinks a couple glasses of wine most nights. Denied history of heavy drinking. Denied substance abuse.  Current Examination:  Behavioral Observations:  Appearance: Neat, appropriately dressed Gait: Ambulated independently, no gait abnormalities observed Speech: Fluent; normal rate, rhythm and volume Thought process: Linear, goal directed Affect: Full, euthymic, appropriate Task persistence: Good Interpersonal: Pleasant, appropriate Orientation: Oriented to person, place and most aspects of time; stated year as 2018  Tests Administered: . Test of Premorbid Functioning (TOPF) . Repeatable Battery for the Assessment of Neuropsychological Status (RBANS) Form A: Figure Copy and Recall subtests, Story Memory and Recall subtests . Wechsler Adult Intelligence Scale-Fourth Edition (WAIS-IV): Similarities, Coding and Digit Span subtests . Neuropsychological Assessment Battery (NAB) Language Module, Form 1: Naming Subtest . Controlled Oral Word Association Test (COWAT) . Trail Making Test A and B . [employer] Verbal Learning Test - 2nd Edition (CVLT-2) Short Form . Clock drawing test . Geriatric Depression Scale (GDS) 15 Item . Generalized Anxiety Disorder - 7 item screener (GAD-7)  Test Results: Note: Standardized scores are presented only for use by appropriately trained professionals and to allow for any future test-retest comparison. These scores should not be interpreted without consideration of all the information that is contained in the rest of the report. The most recent standardization samples from the test publisher  or other sources were used whenever possible to derive standard scores; scores were corrected for age, gender, ethnicity and education when available.   Test Scores:  Test Name Standardized Score Descriptor  TOPF SS=101 Average  WAIS-IV Subtests    Similarities ss=11 Average  Coding ss=9 Average  Digit Span Forward ss=13 High average  Digit Span Backward ss=10 Average  RBANS Subtests    Figure Copy Z=-1.0 Low average  Figure Recall Z=-1.31 Low average  Story Memory Z=0.72 High average  Story Recall Z=-0.91 Low average  CVLT-II Scores    Trial 1 Z=0 Average  Trial 4 Z=0 Average  Trials 1-4 total T=51 Average  SD Free Recall Z=-1.0 Low average  LD Free Recall Z=0 Average  LD Cued Recall Z=0 Average  Recognition Discriminability (8/9 hits, 0 false positives) Z=1.0 High average  NAB Naming T=59 High average  COWAT-FAS T=38 Low average  COWAT-Animals T=33 Borderline impaired  Trail Making Test A 0 errors T=55 Average  Trail Making Test B 2 errors T=34 Borderline impaired  Clock Drawing  Impaired   GDS-15  WNL (0/15)  GAD-7  WNL (4/21)   Description of Test Results:  Premorbid verbal intellectual abilities were estimated to have been within the average range based on a test of word reading. Psychomotor processing speed was average. Auditory attention and working memory were high average to average, respectively. Visual-spatial construction was low average. Qualitatively, he omitted some details. Language abilities were variable. Specifically, confrontation naming was high average while semantic verbal fluency was borderline impaired. With regard to verbal memory, encoding and acquisition  of non-contextual information (i.e., word list) was average across four learning trials. After a brief distracter task, free recall was low average. After a delay, both free and cued recall were average. Performance on a yes/no recognition task was high average. On another verbal memory test, encoding  and acquisition of contextual auditory information (i.e., short story) was high average across two learning trials. After a delay, free recall was low average. With regard to non-verbal memory, delayed free recall of visual information was low average. Executive functioning was somewhat variable. Mental flexibility and set-shifting were borderline impaired on Trails B. Verbal fluency with phonemic search restrictions was low average. Performance on a clock drawing task was impaired due to incorrect time placement. Meanwhile, verbal abstract reasoning was average. On self-report questionnaires, the patient's responses were not indicative of clinically significant depression or anxiety at the present time.   Clinical Impressions: Non-amnestic mild cognitive impairment (MCI). Results of the current cognitive evaluation are largely within normal limits, with most areas of function consistent with estimated premorbid intellectual abilities. However, there is evidence of mild executive dysfunction (e.g., mental flexibility and set shifting; verbal fluency; clock drawing). His cognitive profile is not consistent with hippocampal consolidation dysfunction or medial temporal lobe involvement at the present time. Additionally, his cognitive profile and current level of functioning do not warrant a diagnosis of dementia. Based on the history provided, it appears that psychosocial stressors and recent illness have likely contributed to cognitive impairment in daily life. However, I do think there is underlying MCI. Given the mild executive dysfunction noted on this evaluation, a subcortical etiology is most likely. His recent brain MRI did reveal mild white matter signal changes which could represent mild microvascular ischemia and could be the etiology of his MCI.  Recommendations: Based on the findings of the present evaluation, the following recommendations are offered:  1. Optimal control of vascular risk factors is  encouraged in order to reduce the risk of further cognitive decline.  2. Stress management: The patient may benefit from seeing a therapist to address family stress as well as sleep issues. He may also benefit from antidepressant (SSRI) to assist in depression/anxiety related to stressors. At his feedback appointment, the patient reported that he is interested in medication but declined a referral to a therapist.  3. Sleep hygiene is encouraged in order to better manage insomnia, which could also be contributing to cognitive deficits in daily life. 4. The patient should continue to participate in activities which provide mental stimulation and social interaction in order to maintain QOL and maximize cognitive functioning. 5. Neuropsychological re-assessment in one year (or sooner if there is a change in cognition or behavior) is recommended in order to monitor cognitive status, track any progression of symptoms and further assist with treatment planning.   A total of six hours (6 units of CPT [id number]) were spent reviewing medical records, administrating and scoring neuropsychological tests, interpreting test results, preparing this report and providing results to the patient and his wife.     Thank you for your referral of Dean Pruitt. Please feel free to contact me if you have any questions or concerns regarding this report.

## 2016-04-14 NOTE — Progress Notes (Signed)
   Neuropsychology Feedback Appointment  Dean Pruitt and his wife returned for a feedback appointment today to review the results of his recent neuropsychological evaluation with this provider. 30 minutes face-to-face time was spent reviewing his test results, my impressions and my recommendations as detailed in his report. Education was provided about strategies to maintain brain health, as well as the impact of stress on cognitive functioning. The patient and his wife were given the opportunity to ask questions, and I did my best to answer these to their satisfaction. The patient is interested in starting antidepressant medication and will discuss this with Dr. Tomi Likens at his next neurology appointment.  Total time spent on this patient's case: 90791x1 unit; 96118x6 units including medical record review, interpretation, report writing, and feedback.

## 2016-04-22 ENCOUNTER — Ambulatory Visit (INDEPENDENT_AMBULATORY_CARE_PROVIDER_SITE_OTHER): Payer: PPO | Admitting: Neurology

## 2016-04-22 ENCOUNTER — Encounter: Payer: Self-pay | Admitting: Neurology

## 2016-04-22 VITALS — BP 118/84 | HR 74 | Ht 71.0 in | Wt 202.0 lb

## 2016-04-22 DIAGNOSIS — F4323 Adjustment disorder with mixed anxiety and depressed mood: Secondary | ICD-10-CM

## 2016-04-22 DIAGNOSIS — G3184 Mild cognitive impairment, so stated: Secondary | ICD-10-CM | POA: Diagnosis not present

## 2016-04-22 MED ORDER — ESCITALOPRAM OXALATE 10 MG PO TABS: 10.0000 mg | ORAL_TABLET | Freq: Every day | ORAL | Status: DC

## 2016-04-22 NOTE — Progress Notes (Signed)
NEUROLOGY FOLLOW UP OFFICE NOTE  [place] [id number]  HISTORY OF PRESENT ILLNESS: Dean Pruitt is a 82 year old rightt-handed male with hyperlipidemia, elevated blood pressure and history of thyroid cancer s/p thyroidectomy who follows up for cognitive impairment.  UPDATE: B12 from 02/13/16 was 326. MRI of brain from 02/26/16 revealed age-related generalized cerebral volume loss and mild nonspecific white matter changes.  Dean Pruitt underwent neuropsychological testing on 04/14/16.  Results were overall within normal limits, however there was evidence of mild executive dysfunction, as demonstrated with abnormalities in mental flexibility and set shifting, verbal fluency and clock drawing.  Although stress was likely the primary cause of his memory problems, these findings also suggest an underlying non-amnestic mild cognitive impairment.    HISTORY: His wife started noticing problems with memory about 6 or 7 months ago.  He recognizes memory problems too, but is not as concerned as his wife.  He started to become disoriented driving on familiar routes.  From church, he would be unsure how to get to known restaurants.  He had trouble driving to his daughter's house, as well.  Once in a while, hew would misplace things, such as his checkbook.  Over the span of a few months, he forgot to pay some pills three times.  He has since set up auto-payments.  This past year, he began to struggle with paying his taxes.  He sets up his pillbox for the week every week, which works out well.  He once saw a close friend from church whom he hasn't seen for several years.  He recognized him but couldn't remember his name.  Sleeping is poor.  He drinks 2 glasses of wine a day.  He does not exercise.  He denies depression, but he has some family stressors.  He has two adult children who are ill and he has a grandson with drug problems.   He is a Programmer, systems. His mother had Alzheimer's  disease.  PAST MEDICAL HISTORY: Past Medical History  Diagnosis Date  . Thyroid cancer [facility])     PMH of; on supressive therapy  . Hyperlipidemia   . Cataract     MEDICATIONS: Current Outpatient Prescriptions on File Prior to Visit  Medication Sig Dispense Refill  . levothyroxine (SYNTHROID, LEVOTHROID) 150 MCG tablet 07/02/15 : 1 qd EXCEPT 1/2 on Tues & Thurs 90 tablet 1  . omeprazole (PRILOSEC) 20 MG capsule Take 20 mg by mouth daily.    . rosuvastatin (CRESTOR) 20 MG tablet Take 1 tablet (20 mg total) by mouth daily. 90 tablet 3  . zolpidem (AMBIEN) 10 MG tablet TAKE 1 TABLET BY MOUTH EVERY 3RD NIGHT AS NEEDED (Patient taking differently: Take 1/2 tablet as needed) 10 tablet 0   No current facility-administered medications on file prior to visit.    ALLERGIES: No Known Allergies  FAMILY HISTORY: Family History  Problem Relation Age of Onset  . Alzheimer's disease Mother   . Hypertension Mother   . Heart disease Father     CAD and angioplasty in 42s  . Cancer Father     Bladder  . Diabetes Neg Hx   . Stroke Neg Hx   . Other Brother     valvular heart disease    SOCIAL HISTORY: Married Former smoker 1 glass of wine a night  REVIEW OF SYSTEMS: Constitutional: No fevers, chills, or sweats, no generalized fatigue, change in appetite Eyes: No visual changes, double vision, eye pain Ear, nose and throat:  No hearing loss, ear pain, nasal congestion, sore throat Cardiovascular: No chest pain, palpitations Respiratory:  No shortness of breath at rest or with exertion, wheezes GastrointestinaI: No nausea, vomiting, diarrhea, abdominal pain, fecal incontinence Genitourinary:  No dysuria, urinary retention or frequency Musculoskeletal:  No neck pain, back pain Integumentary: No rash, pruritus, skin lesions Neurological: as above Psychiatric: No depression, insomnia, anxiety Endocrine: No palpitations, fatigue, diaphoresis, mood swings, change in appetite, change in  weight, increased thirst Hematologic/Lymphatic:  No purpura, petechiae. Allergic/Immunologic: no itchy/runny eyes, nasal congestion, recent allergic reactions, rashes  PHYSICAL EXAM: Filed Vitals:   04/22/16 1310  BP: 118/84  Pulse: 74   General: No acute distress.  Patient appears well-groomed.  normal body habitus.  IMPRESSION: Non-amnestic mild cognitive impairment Adjustment disorder with depressed mood  PLAN: 1.  Routine exercise 2.  Mediterranean diet 3.  Improve sleep hygiene (info provided) 4.  Lexapro '10mg'$  at bedtime for mood and to help sleep 5.  Crestor 6.  Follow up in 6 months.  Repeat neuropsych testing in one year  22 minutes spent face to face with patient, 100% spent discussing diagnosis, test results and plan.  Metta Clines, DO  CC:  Billey Gosling, MD

## 2016-04-22 NOTE — Patient Instructions (Addendum)
You don't have dementia/Alzheimer's.  Stress is probably playing a major role.  1.  To help with stress and sleep, we will start escitalopram '10mg'$  at bedtime.  2.  Start routine exercise.  Continue playing golf.  However, go out for a walk on days that you do not play golf. 3.  Try to follow the sleep hygiene sheet provided. 4.  Recommend Mediterranean diet    Why follow it? Research shows. . Those who follow the Mediterranean diet have a reduced risk of heart disease  . The diet is associated with a reduced incidence of Parkinson's and Alzheimer's diseases . People following the diet may have longer life expectancies and lower rates of chronic diseases  . The Dietary Guidelines for Americans recommends the Mediterranean diet as an eating plan to promote health and prevent disease  What Is the Mediterranean Diet?  . Healthy eating plan based on typical foods and recipes of Mediterranean-style cooking . The diet is primarily a plant based diet; these foods should make up a majority of meals   Starches - Plant based foods should make up a majority of meals - They are an important sources of vitamins, minerals, energy, antioxidants, and fiber - Choose whole grains, foods high in fiber and minimally processed items  - Typical grain sources include wheat, oats, barley, corn, brown rice, bulgar, farro, millet, polenta, couscous  - Various types of beans include chickpeas, lentils, fava beans, black beans, white beans   Fruits  Veggies - Large quantities of antioxidant rich fruits & veggies; 6 or more servings  - Vegetables can be eaten raw or lightly drizzled with oil and cooked  - Vegetables common to the traditional Mediterranean Diet include: artichokes, arugula, beets, broccoli, brussel sprouts, cabbage, carrots, celery, collard greens, cucumbers, eggplant, kale, leeks, lemons, lettuce, mushrooms, okra, onions, peas, peppers, potatoes, pumpkin, radishes, rutabaga, shallots, spinach, sweet  potatoes, turnips, zucchini - Fruits common to the Mediterranean Diet include: apples, apricots, avocados, cherries, clementines, dates, figs, grapefruits, grapes, melons, nectarines, oranges, peaches, pears, pomegranates, strawberries, tangerines  Fats - Replace butter and margarine with healthy oils, such as olive oil, canola oil, and tahini  - Limit nuts to no more than a handful a day  - Nuts include walnuts, almonds, pecans, pistachios, pine nuts  - Limit or avoid candied, honey roasted or heavily salted nuts - Olives are central to the [employer] - can be eaten whole or used in a variety of dishes   Meats Protein - Limiting red meat: no more than a few times a month - When eating red meat: choose lean cuts and keep the portion to the size of deck of cards - Eggs: approx. 0 to 4 times a week  - Fish and lean poultry: at least 2 a week  - Healthy protein sources include, chicken, [place], lean beef, lamb - Increase intake of seafood such as tuna, salmon, trout, mackerel, shrimp, scallops - Avoid or limit high fat processed meats such as sausage and bacon  Dairy - Include moderate amounts of low fat dairy products  - Focus on healthy dairy such as fat free yogurt, skim milk, low or reduced fat cheese - Limit dairy products higher in fat such as whole or 2% milk, cheese, ice cream  Alcohol - Moderate amounts of red wine is ok  - No more than 5 oz daily for women (all ages) and men older than age 67  - No more than 10 oz of wine daily for men  younger than 30  Other - Limit sweets and other desserts  - Use herbs and spices instead of salt to flavor foods  - Herbs and spices common to the traditional Mediterranean Diet include: basil, bay leaves, chives, cloves, cumin, fennel, garlic, lavender, marjoram, mint, oregano, parsley, pepper, rosemary, sage, savory, sumac, tarragon, thyme   It's not just a diet, it's a lifestyle:  . The Mediterranean diet includes lifestyle factors typical of  those in the region  . Foods, drinks and meals are best eaten with others and savored . Daily physical activity is important for overall good health . This could be strenuous exercise like running and aerobics . This could also be more leisurely activities such as walking, housework, yard-work, or taking the stairs . Moderation is the key; a balanced and healthy diet accommodates most foods and drinks . Consider portion sizes and frequency of consumption of certain foods   Meal Ideas & Options:  . Breakfast:  o Whole wheat toast or whole wheat English muffins with peanut butter & hard boiled egg o Steel cut oats topped with apples & cinnamon and skim milk  o Fresh fruit: banana, strawberries, melon, berries, peaches  o Smoothies: strawberries, bananas, greek yogurt, peanut butter o Low fat greek yogurt with blueberries and granola  o Egg white omelet with spinach and mushrooms o Breakfast couscous: whole wheat couscous, apricots, skim milk, cranberries  . Sandwiches:  o Hummus and grilled vegetables (peppers, zucchini, squash) on whole wheat bread   o Grilled chicken on whole wheat pita with lettuce, tomatoes, cucumbers or tzatziki  o Tuna salad on whole wheat bread: tuna salad made with greek yogurt, olives, red peppers, capers, green onions o Garlic rosemary lamb pita: lamb sauted with garlic, rosemary, salt & pepper; add lettuce, cucumber, greek yogurt to pita - flavor with lemon juice and black pepper  . Seafood:  o Mediterranean grilled salmon, seasoned with garlic, basil, parsley, lemon juice and black pepper o Shrimp, lemon, and spinach whole-grain pasta salad made with low fat greek yogurt  o Seared scallops with lemon orzo  o Seared tuna steaks seasoned salt, pepper, coriander topped with tomato mixture of olives, tomatoes, olive oil, minced garlic, parsley, green onions and cappers  . Meats:  o Herbed greek chicken salad with kalamata olives, cucumber, feta  o Red bell peppers  stuffed with spinach, bulgur, lean ground beef (or lentils) & topped with feta   o Kebabs: skewers of chicken, tomatoes, onions, zucchini, squash  o [place] burgers: made with red onions, mint, dill, lemon juice, feta cheese topped with roasted red peppers . Vegetarian o Cucumber salad: cucumbers, artichoke hearts, celery, red onion, feta cheese, tossed in olive oil & lemon juice  o Hummus and whole grain pita points with a greek salad (lettuce, tomato, feta, olives, cucumbers, red onion) o Lentil soup with celery, carrots made with vegetable broth, garlic, salt and pepper  o Tabouli salad: parsley, bulgur, mint, scallions, cucumbers, tomato, radishes, lemon juice, olive oil, salt and pepper. 5.  Follow up with me in 6 months.  I would like you to see Dr. Si Raider in one year and then follow up with me again soon afterwards.

## 2016-05-12 ENCOUNTER — Other Ambulatory Visit: Payer: Self-pay

## 2016-05-12 MED ORDER — ESCITALOPRAM OXALATE 10 MG PO TABS: 10.0000 mg | ORAL_TABLET | Freq: Every day | ORAL | Status: DC

## 2016-06-13 ENCOUNTER — Other Ambulatory Visit: Payer: Self-pay | Admitting: Internal Medicine

## 2016-07-02 ENCOUNTER — Encounter: Payer: PPO | Admitting: Internal Medicine

## 2016-07-03 ENCOUNTER — Encounter: Payer: Self-pay | Admitting: Internal Medicine

## 2016-07-03 ENCOUNTER — Ambulatory Visit (INDEPENDENT_AMBULATORY_CARE_PROVIDER_SITE_OTHER): Payer: PPO | Admitting: Internal Medicine

## 2016-07-03 ENCOUNTER — Other Ambulatory Visit (INDEPENDENT_AMBULATORY_CARE_PROVIDER_SITE_OTHER): Payer: PPO

## 2016-07-03 VITALS — BP 104/72 | HR 74 | Temp 98.2°F | Resp 16 | Wt 189.0 lb

## 2016-07-03 DIAGNOSIS — Z0000 Encounter for general adult medical examination without abnormal findings: Secondary | ICD-10-CM | POA: Diagnosis not present

## 2016-07-03 DIAGNOSIS — R7301 Impaired fasting glucose: Secondary | ICD-10-CM | POA: Diagnosis not present

## 2016-07-03 DIAGNOSIS — M791 Myalgia, unspecified site: Secondary | ICD-10-CM

## 2016-07-03 DIAGNOSIS — Z23 Encounter for immunization: Secondary | ICD-10-CM

## 2016-07-03 DIAGNOSIS — E785 Hyperlipidemia, unspecified: Secondary | ICD-10-CM

## 2016-07-03 DIAGNOSIS — G3184 Mild cognitive impairment, so stated: Secondary | ICD-10-CM | POA: Diagnosis not present

## 2016-07-03 DIAGNOSIS — Z85850 Personal history of malignant neoplasm of thyroid: Secondary | ICD-10-CM

## 2016-07-03 DIAGNOSIS — F329 Major depressive disorder, single episode, unspecified: Secondary | ICD-10-CM | POA: Insufficient documentation

## 2016-07-03 DIAGNOSIS — F4321 Adjustment disorder with depressed mood: Secondary | ICD-10-CM

## 2016-07-03 DIAGNOSIS — F32A Depression, unspecified: Secondary | ICD-10-CM | POA: Insufficient documentation

## 2016-07-03 LAB — COMPREHENSIVE METABOLIC PANEL
ALBUMIN: 4.2 g/dL (ref 3.5–5.2)
ALT: 22 U/L (ref 0–53)
AST: 34 U/L (ref 0–37)
Alkaline Phosphatase: 72 U/L (ref 39–117)
BUN: 14 mg/dL (ref 6–23)
CALCIUM: 9.7 mg/dL (ref 8.4–10.5)
CHLORIDE: 102 meq/L (ref 96–112)
CO2: 28 mEq/L (ref 19–32)
Creatinine, Ser: 0.92 mg/dL (ref 0.40–1.50)
GFR: 84.15 mL/min (ref >60.00)
Glucose, Bld: 100 mg/dL — ABNORMAL HIGH (ref 70–99)
POTASSIUM: 4.3 meq/L (ref 3.5–5.1)
SODIUM: 136 meq/L (ref 135–145)
Total Bilirubin: 1.3 mg/dL — ABNORMAL HIGH (ref 0.2–1.2)
Total Protein: 6.9 g/dL (ref 6.0–8.3)

## 2016-07-03 LAB — CBC WITH DIFFERENTIAL/PLATELET
BASOS PCT: 0.3 % (ref 0.0–3.0)
Basophils Absolute: 0 10*3/uL (ref 0.0–0.1)
EOS ABS: 0.1 10*3/uL (ref 0.0–0.7)
Eosinophils Relative: 1 % (ref 0.0–5.0)
HEMATOCRIT: 48.5 % (ref 39.0–52.0)
HEMOGLOBIN: 16.5 g/dL (ref 13.0–17.0)
Lymphocytes Relative: 24.1 % (ref 12.0–46.0)
Lymphs Abs: 1.5 10*3/uL (ref 0.7–4.0)
MCHC: 34 g/dL (ref 30.0–36.0)
MCV: 92.7 fl (ref 78.0–100.0)
MONO ABS: 0.6 10*3/uL (ref 0.1–1.0)
Monocytes Relative: 10.2 % (ref 3.0–12.0)
NEUTROS PCT: 64.4 % (ref 43.0–77.0)
Neutro Abs: 4.1 10*3/uL (ref 1.4–7.7)
Platelets: 203 10*3/uL (ref 150.0–400.0)
RBC: 5.23 Mil/uL (ref 4.22–5.81)
RDW: 13.1 % (ref 11.5–15.5)
WBC: 6.4 10*3/uL (ref 4.0–10.5)

## 2016-07-03 LAB — LIPID PANEL
CHOLESTEROL: 144 mg/dL (ref 0–200)
HDL: 39.3 mg/dL (ref >39.00)
LDL CALC: 86 mg/dL (ref 0–99)
NonHDL: 105.14
TRIGLYCERIDES: 95 mg/dL (ref 0.0–149.0)
Total CHOL/HDL Ratio: 4
VLDL: 19 mg/dL (ref 0.0–40.0)

## 2016-07-03 LAB — SEDIMENTATION RATE: Sed Rate: 1 mm/hr (ref 0–20)

## 2016-07-03 LAB — CK: Total CK: 34 U/L (ref 7–232)

## 2016-07-03 LAB — TSH: TSH: 0.28 u[IU]/mL — ABNORMAL LOW (ref 0.35–4.50)

## 2016-07-03 MED ORDER — OMEPRAZOLE 20 MG PO CPDR: 20.0000 mg | DELAYED_RELEASE_CAPSULE | Freq: Every day | ORAL | 3 refills | Status: DC

## 2016-07-03 MED ORDER — LEVOTHYROXINE SODIUM 150 MCG PO TABS: ORAL_TABLET | ORAL | 3 refills | Status: DC

## 2016-07-03 NOTE — Patient Instructions (Addendum)
  Mr. Stimpson , Thank you for taking time to come for your Medicare Wellness Visit. I appreciate your ongoing commitment to your health goals. Please review the following plan we discussed and let me know if I can assist you in the future.   These are the goals we discussed: Goals    None      This is a list of the screening recommended for you and due dates:  Health Maintenance  Topic Date Due  . Tetanus Vaccine  07/22/1955  . Shingles Vaccine  07/21/1996  . Pneumonia vaccines (1 of 2 - PCV13) Given today  . Flu Shot  07/07/2016     Test(s) ordered today. Your results will be released to [place] (or called to you) after review, usually within 72hours after test completion. If any changes need to be made, you will be notified at that same time.  All other Health Maintenance issues reviewed.   All recommended immunizations and age-appropriate screenings are up-to-date or discussed.  prevnar vaccine administered today.   Medications reviewed and updated.  Changes include trying the crestor 20 mg every other day to see if that helps to decease your leg pain.  We can adjust the dose if needed if it helps.   Your prescription(s) have been submitted to your pharmacy. Please take as directed and contact our office if you believe you are having problem(s) with the medication(s).   Please followup in one year for a physical, sooner if needed

## 2016-07-03 NOTE — Progress Notes (Signed)
Pre visit review using our clinic review tool, if applicable. No additional management support is needed unless otherwise documented below in the visit note. 

## 2016-07-03 NOTE — Progress Notes (Signed)
Subjective:    Patient ID: Dean Pruitt, male    DOB: 29-May-1934, 82 y.o.   MRN: [id number]  HPI Here for medicare wellness exam.   He has joint and muscle pain in his legs and wonders if it is related to the crestor.  He wonders if he can try decreasing the dose.   He has intentionally lost weight.  He has decrease how much he is eating.   I have personally reviewed and have noted 1.The patient's medical and social history 2.Their use of alcohol, tobacco or illicit drugs 3.Their current medications and supplements 4.The patient's functional ability including ADL's, fall risks, home safety risks and                 hearing or visual impairment. 5.Diet and physical activities 6.Evidence for depression or mood disorders 7.Care team reviewed and updated  - neurology - Dr Tomi Likens   Are there smokers in your home (other than you)? No  Risk Factors Exercise:  Yard work, typically walks, but only in good weather Dietary issues discussed: eats a lot soup, well balanced  Cardiac risk factors: advanced age, hyperlipidemia  Depression Screen  Have you felt down, depressed or hopeless? No  Have you felt little interest or pleasure in doing things?  No  Activities of Daily Living In your present state of health, do you have any difficulty performing the following activities?:  Driving? No Managing money?  No Feeding yourself? No Getting from bed to chair? No Climbing a flight of stairs? No Preparing food and eating?: No Bathing or showering? No Getting dressed: No Getting to/using the toilet? No Moving around from place to place: No In the past year have you fallen or had a near fall?: No   Are you sexually active?  No  Do you have more than one partner?  N/A  Hearing Difficulties: No Do you often ask people to speak up or repeat themselves? No Do you experience ringing or noises in your ears? No Do you  have difficulty understanding soft or whispered voices? No Vision:              Any change in vision:  No              Up to date with eye exam: Up to date  Memory:  Do you feel that you have a problem with memory? Yes, already seeing neurology    Advanced Directives have been discussed with the patient? Yes   Medications and allergies reviewed with patient and updated if appropriate.  Patient Active Problem List   Diagnosis Date Noted  . Myalgia 07/04/2016  . Mild cognitive impairment 07/03/2016  . Adjustment disorder with depressed mood 07/03/2016  . Frequency of urination 09/26/2014  . Macular degeneration 06/22/2011  . Fasting hyperglycemia 05/20/2009  . NONSPECIFIC ABNORMAL ELECTROCARDIOGRAM 05/20/2009  . THYROID CANCER, HX OF 05/20/2009  . History of colonic polyps 05/20/2009  . Hyperlipidemia 11/21/2007    Current Outpatient Prescriptions on File Prior to Visit  Medication Sig Dispense Refill  . escitalopram (LEXAPRO) 10 MG tablet Take 1 tablet (10 mg total) by mouth at bedtime. 90 tablet 1  . rosuvastatin (CRESTOR) 20 MG tablet Take 1 tablet (20 mg total) by mouth daily. 90 tablet 3   No current facility-administered medications on file prior to visit.     Past Medical History:  Diagnosis Date  . Cataract   . Hyperlipidemia   . Thyroid cancer ([place])  PMH of; on supressive therapy    Past Surgical History:  Procedure Laterality Date  . CATARACT EXTRACTION, BILATERAL  2013 & 2014   Dr Celene Squibb  . colon polyps  2002 & 2005   negative 2008; Dr Earlean Shawl  . THYROIDECTOMY  2001   S/P RAI    Social History   Social History  . Marital status: Married    Spouse name: N/A  . Number of children: 2  . Years of education: N/A   Occupational History  . Retired    Social History Main Topics  . Smoking status: Former Smoker    Quit date: 12/07/1958  . Smokeless tobacco: Never Used     Comment: smoked 1956-1960, up 1 ppd  . Alcohol use 8.4 oz/week    14 Glasses  of wine per week  . Drug use: No  . Sexual activity: Not Asked   Other Topics Concern  . None   Social History Narrative   Exercise:  Yard work, walks on occasion                   Family History  Problem Relation Age of Onset  . Alzheimer's disease Mother   . Hypertension Mother   . Heart disease Father     CAD and angioplasty in 51s  . Cancer Father     Bladder  . Other Brother     valvular heart disease  . Diabetes Neg Hx   . Stroke Neg Hx     Review of Systems  Constitutional: Negative for appetite change, chills, fatigue, fever and unexpected weight change (intentional weight loss - has cut down on portions).  HENT: Negative for hearing loss and tinnitus.   Eyes: Negative for visual disturbance.  Respiratory: Negative for cough, shortness of breath and wheezing.   Cardiovascular: Negative for chest pain, palpitations and leg swelling.  Gastrointestinal: Negative for abdominal pain, blood in stool, constipation, diarrhea and nausea.       Gerd controlled; occasional fecal incontinence  Genitourinary: Negative for dysuria and hematuria.  Musculoskeletal:       Pain in knees and muscles in legs  Skin: Negative for color change and rash.  Neurological: Negative for dizziness, light-headedness and headaches.  Psychiatric/Behavioral: Negative for dysphoric mood. The patient is not nervous/anxious.        Objective:   Vitals:   07/03/16 1531  BP: 104/72  Pulse: 74  Resp: 16  Temp: 98.2 F (36.8 C)   Filed Weights   07/03/16 1531  Weight: 189 lb (85.7 kg)   Body mass index is 26.36 kg/m.   Physical Exam Constitutional: He appears well-developed and well-nourished. No distress.  HENT:  Head: Normocephalic and atraumatic.  Right Ear: External ear normal.  Left Ear: External ear normal.  Mouth/Throat: Oropharynx is clear and moist.  Normal ear canals and TM b/l  Eyes: Conjunctivae and EOM are normal.  Neck: Neck supple. No tracheal deviation present.  No thyroid present.  No carotid bruit  Cardiovascular: Normal rate, regular rhythm, normal heart sounds and intact distal pulses.   No murmur heard. Pulmonary/Chest: Effort normal and breath sounds normal. No respiratory distress. He has no wheezes. He has no rales.  Abdominal: Soft. Bowel sounds are normal. He exhibits no distension. There is no tenderness.  Genitourinary: deferred  Musculoskeletal: He exhibits no edema.  Lymphadenopathy:    He has no cervical adenopathy.  Skin: Skin is warm and dry. He is not diaphoretic.  Psychiatric: He has a  normal mood and affect. His behavior is normal.         Assessment & Plan:   Wellness Exam: Immunizations prevnar today Colonoscopy    No longer needed at his age Eye exam  Up to date  Hearing loss  - none Memory concerns/difficulties - yes, following with neurology Independent of ADLs  -  fully Stressed the importance of regular exercise   Patient received copy of preventative screening tests/immunizations recommended for the next 5-10 years.

## 2016-07-03 NOTE — Assessment & Plan Note (Signed)
Taking lexapro at night - prescribed by Dr Tomi Likens

## 2016-07-04 DIAGNOSIS — M791 Myalgia, unspecified site: Secondary | ICD-10-CM | POA: Insufficient documentation

## 2016-07-04 NOTE — Assessment & Plan Note (Signed)
And legs, also associated with knee pain He is concerned this may be related to the statin-we'll decrease dose Check CMP, CBC, ESR, CK

## 2016-07-04 NOTE — Assessment & Plan Note (Signed)
Cancer was over 10 years ago, no signs of recurrence Will adjust medication if needed

## 2016-07-04 NOTE — Assessment & Plan Note (Signed)
Currently taking Crestor 20 mg daily He does experience like muscle pain and knee pain-wonders if this is related to the Crestor Check lipid panel now, check CMP, ESR Decrease Crestor to 20 mg every other day-may need to use further to see if it is causing the muscle pain

## 2016-07-04 NOTE — Assessment & Plan Note (Signed)
Has lost weight so this is likely not an issue Check CMP today-we will add A1c if needed

## 2016-07-04 NOTE — Assessment & Plan Note (Signed)
Following with neurology

## 2016-07-05 ENCOUNTER — Other Ambulatory Visit: Payer: Self-pay | Admitting: Internal Medicine

## 2016-07-05 MED ORDER — LEVOTHYROXINE SODIUM 150 MCG PO TABS: ORAL_TABLET | ORAL | 3 refills | Status: DC

## 2016-07-07 ENCOUNTER — Telehealth: Payer: Self-pay | Admitting: Emergency Medicine

## 2016-07-07 MED ORDER — LEVOTHYROXINE SODIUM 150 MCG PO TABS: ORAL_TABLET | ORAL | 1 refills | Status: DC

## 2016-07-07 MED ORDER — LEVOTHYROXINE SODIUM 75 MCG PO TABS: ORAL_TABLET | ORAL | 0 refills | Status: DC

## 2016-07-07 NOTE — Telephone Encounter (Signed)
Yes, that's good. Thanks.  Med list updated

## 2016-07-07 NOTE — Telephone Encounter (Signed)
Pts wife requested two prescriptions of levothyroxine so pt would not have to cut tablets in haldf. Rxs sent to [facility].

## 2016-07-07 NOTE — Telephone Encounter (Signed)
Pts wife called back to inform that pt was already taking 1/2 tab of 150 mcg 3 times and week. Informed that he should change to 4 times a week. Is this okay?

## 2016-07-15 DIAGNOSIS — R197 Diarrhea, unspecified: Secondary | ICD-10-CM | POA: Diagnosis not present

## 2016-07-25 ENCOUNTER — Other Ambulatory Visit: Payer: Self-pay | Admitting: Internal Medicine

## 2016-07-27 NOTE — Telephone Encounter (Signed)
States needs medication changed to 90 day supply with '150mg'$  of levothyroxine.  Patient cant tell the difference between the 150 and '75mg'$ .  Patient will split the 150 in half.  Can call wife at [place] at [phone] with questions.

## 2016-08-13 ENCOUNTER — Ambulatory Visit (INDEPENDENT_AMBULATORY_CARE_PROVIDER_SITE_OTHER): Payer: PPO | Admitting: Internal Medicine

## 2016-08-13 ENCOUNTER — Ambulatory Visit (INDEPENDENT_AMBULATORY_CARE_PROVIDER_SITE_OTHER)
Admission: RE | Admit: 2016-08-13 | Discharge: 2016-08-13 | Disposition: A | Discharge status: Home / Self Care | Payer: PPO | Source: Ambulatory Visit | Attending: Internal Medicine | Admitting: Internal Medicine

## 2016-08-13 ENCOUNTER — Encounter: Payer: Self-pay | Admitting: Internal Medicine

## 2016-08-13 VITALS — BP 106/84 | HR 64 | Temp 97.8°F | Resp 16 | Wt 184.0 lb

## 2016-08-13 DIAGNOSIS — M79675 Pain in left toe(s): Secondary | ICD-10-CM

## 2016-08-13 DIAGNOSIS — M79672 Pain in left foot: Secondary | ICD-10-CM | POA: Diagnosis not present

## 2016-08-13 DIAGNOSIS — M7989 Other specified soft tissue disorders: Secondary | ICD-10-CM | POA: Diagnosis not present

## 2016-08-13 NOTE — Progress Notes (Signed)
Pre visit review using our clinic review tool, if applicable. No additional management support is needed unless otherwise documented below in the visit note. 

## 2016-08-13 NOTE — Assessment & Plan Note (Signed)
Toe injury after fall 3 weeks ago Check xrays to rule out fracture Symptoms are improving slowly and most likely will continue to improve

## 2016-08-13 NOTE — Patient Instructions (Signed)
Have your xray done today and we will call you with the results.

## 2016-08-13 NOTE — Progress Notes (Signed)
Subjective:    Patient ID: Dean Pruitt, male    DOB: 1934-11-06, 82 y.o.   MRN: [id number]  HPI He is here for an acute visit.   Left toe pain:  Three weeks ago he was mowing the lawn and his left second toe got stuck in the ground and he fell forward.  It was swollen and painful and those symptoms have improved.  He now only has mild pain.  He denies numbness/tingling.  He can move the toe, but has difficulty lifting the toe upward.  This has caused him to fall a couple of times.  He thought his symptoms would have improved quicker than they have.   He did get bruising on his face when he fell and some other minor injuries which have improved.  He denies back pain, left pain/numbness/tingling or weakness.  His foot and ankle are fine and he denies any decreased ROM or pain in them.         Medications and allergies reviewed with patient and updated if appropriate.  Patient Active Problem List   Diagnosis Date Noted  . Myalgia 07/04/2016  . Mild cognitive impairment 07/03/2016  . Adjustment disorder with depressed mood 07/03/2016  . Frequency of urination 09/26/2014  . Macular degeneration 06/22/2011  . Fasting hyperglycemia 05/20/2009  . NONSPECIFIC ABNORMAL ELECTROCARDIOGRAM 05/20/2009  . THYROID CANCER, HX OF 05/20/2009  . History of colonic polyps 05/20/2009  . Hyperlipidemia 11/21/2007    Current Outpatient Prescriptions on File Prior to Visit  Medication Sig Dispense Refill  . escitalopram (LEXAPRO) 10 MG tablet Take 1 tablet (10 mg total) by mouth at bedtime. 90 tablet 1  . levothyroxine (SYNTHROID, LEVOTHROID) 150 MCG tablet TAKE 1 TABLET (150 mcg) BY MOUTH 3 days a week, 75 mcg 4 days a week 45 tablet 1  . rosuvastatin (CRESTOR) 20 MG tablet Take 1 tablet (20 mg total) by mouth daily. 90 tablet 3  . omeprazole (PRILOSEC) 20 MG capsule Take 1 capsule (20 mg total) by mouth daily. (Patient not taking: Reported on 08/13/2016) 90 capsule 3   No current  facility-administered medications on file prior to visit.     Past Medical History:  Diagnosis Date  . Cataract   . Hyperlipidemia   . Thyroid cancer ([place])    PMH of; on supressive therapy    Past Surgical History:  Procedure Laterality Date  . CATARACT EXTRACTION, BILATERAL  2013 & 2014   Dr Celene Squibb  . colon polyps  2002 & 2005   negative 2008; Dr Earlean Shawl  . THYROIDECTOMY  2001   S/P RAI    Social History   Social History  . Marital status: Married    Spouse name: N/A  . Number of children: 2  . Years of education: N/A   Occupational History  . Retired    Social History Main Topics  . Smoking status: Former Smoker    Quit date: 12/07/1958  . Smokeless tobacco: Never Used     Comment: smoked 1956-1960, up 1 ppd  . Alcohol use 8.4 oz/week    14 Glasses of wine per week  . Drug use: No  . Sexual activity: Not on file   Other Topics Concern  . Not on file   Social History Narrative   Exercise:  Yard work, walks on occasion                   Family History  Problem Relation Age of Onset  .  Alzheimer's disease Mother   . Hypertension Mother   . Heart disease Father     CAD and angioplasty in 47s  . Cancer Father     Bladder  . Other Brother     valvular heart disease  . Diabetes Neg Hx   . Stroke Neg Hx     Review of Systems  Musculoskeletal: Negative for back pain and myalgias.  Neurological: Negative for weakness and numbness.       Objective:   Vitals:   08/13/16 1127  BP: 106/84  Pulse: 64  Resp: 16  Temp: 97.8 F (36.6 C)   Filed Weights   08/13/16 1127  Weight: 184 lb (83.5 kg)   Body mass index is 25.66 kg/m.   Physical Exam  Constitutional: He appears well-developed and well-nourished. No distress.  HENT:  Bruising right orbit  Musculoskeletal: He exhibits no edema.  Left second toe with mild bruising, mild swelling at base, tenderness around MTP, no deformity  Skin: He is not diaphoretic.          Assessment &  Plan:   See Problem List for Assessment and Plan of chronic medical problems.

## 2016-09-02 ENCOUNTER — Other Ambulatory Visit: Payer: Self-pay | Admitting: Emergency Medicine

## 2016-09-02 ENCOUNTER — Telehealth: Payer: Self-pay | Admitting: Internal Medicine

## 2016-09-02 DIAGNOSIS — E785 Hyperlipidemia, unspecified: Secondary | ICD-10-CM

## 2016-09-02 MED ORDER — ROSUVASTATIN CALCIUM 20 MG PO TABS: 20.0000 mg | ORAL_TABLET | Freq: Every day | ORAL | 3 refills | Status: DC

## 2016-09-02 NOTE — Telephone Encounter (Signed)
Pt wife called in very upset that a refill was suppose to be e-scribe to [place] on Monday.  I did not see any med was e scribe .  Pt needs refill TODAY because he is going out of town and does not have enough to make it.  Will you please send this to [facility]    rosuvastatin (CRESTOR) 20 MG tablet [[id number]]

## 2016-09-02 NOTE — Telephone Encounter (Signed)
Refill has been sent.  °

## 2016-09-30 ENCOUNTER — Encounter: Payer: Self-pay | Admitting: Internal Medicine

## 2016-09-30 ENCOUNTER — Ambulatory Visit (INDEPENDENT_AMBULATORY_CARE_PROVIDER_SITE_OTHER): Payer: PPO | Admitting: Internal Medicine

## 2016-09-30 ENCOUNTER — Other Ambulatory Visit (INDEPENDENT_AMBULATORY_CARE_PROVIDER_SITE_OTHER): Payer: PPO

## 2016-09-30 VITALS — BP 130/84 | HR 65 | Temp 97.9°F | Resp 16 | Ht 71.0 in | Wt 187.0 lb

## 2016-09-30 DIAGNOSIS — M21372 Foot drop, left foot: Secondary | ICD-10-CM | POA: Diagnosis not present

## 2016-09-30 DIAGNOSIS — R7303 Prediabetes: Secondary | ICD-10-CM

## 2016-09-30 DIAGNOSIS — Z85850 Personal history of malignant neoplasm of thyroid: Secondary | ICD-10-CM

## 2016-09-30 DIAGNOSIS — E785 Hyperlipidemia, unspecified: Secondary | ICD-10-CM

## 2016-09-30 DIAGNOSIS — M7072 Other bursitis of hip, left hip: Secondary | ICD-10-CM | POA: Diagnosis not present

## 2016-09-30 LAB — COMPREHENSIVE METABOLIC PANEL
ALK PHOS: 78 U/L (ref 39–117)
ALT: 20 U/L (ref 0–53)
AST: 28 U/L (ref 0–37)
Albumin: 4 g/dL (ref 3.5–5.2)
BUN: 10 mg/dL (ref 6–23)
CO2: 28 meq/L (ref 19–32)
Calcium: 9.4 mg/dL (ref 8.4–10.5)
Chloride: 103 mEq/L (ref 96–112)
Creatinine, Ser: 0.81 mg/dL (ref 0.40–1.50)
GFR: 97.4 mL/min (ref >60.00)
GLUCOSE: 99 mg/dL (ref 70–99)
POTASSIUM: 4 meq/L (ref 3.5–5.1)
SODIUM: 139 meq/L (ref 135–145)
TOTAL PROTEIN: 6.8 g/dL (ref 6.0–8.3)
Total Bilirubin: 1.2 mg/dL (ref 0.2–1.2)

## 2016-09-30 LAB — LIPID PANEL
CHOL/HDL RATIO: 3
Cholesterol: 147 mg/dL (ref 0–200)
HDL: 46.8 mg/dL (ref >39.00)
LDL Cholesterol: 80 mg/dL (ref 0–99)
NONHDL: 100.45
Triglycerides: 101 mg/dL (ref 0.0–149.0)
VLDL: 20.2 mg/dL (ref 0.0–40.0)

## 2016-09-30 LAB — T4, FREE: FREE T4: 1.28 ng/dL (ref 0.60–1.60)

## 2016-09-30 LAB — HEMOGLOBIN A1C: Hgb A1c MFr Bld: 5.4 % (ref 4.6–6.5)

## 2016-09-30 LAB — TSH: TSH: 0.35 u[IU]/mL (ref 0.35–4.50)

## 2016-09-30 MED ORDER — MELOXICAM 7.5 MG PO TABS: 7.5000 mg | ORAL_TABLET | Freq: Every day | ORAL | 2 refills | Status: DC

## 2016-09-30 NOTE — Assessment & Plan Note (Addendum)
meloxicam 7.'5mg'$  daily - advised him to take with food, can increase dose if needed Stop if he develops stomach upset/GERD Deferred PT Will have him f/u with Dr Tamala Julian

## 2016-09-30 NOTE — Patient Instructions (Signed)
Take the meloxicam with food daily.  Consider PT for the hip.   An appointment was made for Dr Tamala Julian for further evaluation of your left foot weakness and left hip pain.    Hip Bursitis Bursitis is a swelling and soreness (inflammation) of a fluid-filled sac (bursa). This sac overlies and protects the joints.  CAUSES   Injury.  Overuse of the muscles surrounding the joint.  Arthritis.  Gout.  Infection.  Cold weather.  Inadequate warm-up and conditioning prior to activities. The cause may not be known.  SYMPTOMS   Mild to severe irritation.  Tenderness and swelling over the outside of the hip.  Pain with motion of the hip.  If the bursa becomes infected, a fever may be present. Redness, tenderness, and warmth will develop over the hip. Symptoms usually lessen in 3 to 4 weeks with treatment, but can come back. TREATMENT If conservative treatment does not work, your caregiver may advise draining the bursa and injecting cortisone into the area. This may speed up the healing process. This may also be used as an initial treatment of choice. HOME CARE INSTRUCTIONS   Apply ice to the affected area for 15-20 minutes every 3 to 4 hours while awake for the first 2 days. Put the ice in a plastic bag and place a towel between the bag of ice and your skin.  Rest the painful joint as much as possible, but continue to put the joint through a normal range of motion at least 4 times per day. When the pain lessens, begin normal, slow movements and usual activities to help prevent stiffness of the hip.  Only take over-the-counter or prescription medicines for pain, discomfort, or fever as directed by your caregiver.  Use crutches to limit weight bearing on the hip joint, if advised.  Elevate your painful hip to reduce swelling. Use pillows for propping and cushioning your legs and hips.  Gentle massage may provide comfort and decrease swelling. SEEK IMMEDIATE MEDICAL CARE IF:   Your  pain increases even during treatment, or you are not improving.  You have a fever.  You have heat and inflammation over the involved bursa.  You have any other questions or concerns. MAKE SURE YOU:   Understand these instructions.  Will watch your condition.  Will get help right away if you are not doing well or get worse.   This information is not intended to replace advice given to you by your health care provider. Make sure you discuss any questions you have with your health care provider.   Document Released: 05/15/2002 Document Revised: 02/15/2012 Document Reviewed: 06/25/2015 Elsevier Interactive Patient Education [employer].

## 2016-09-30 NOTE — Progress Notes (Signed)
Subjective:    Patient ID: Dean Pruitt, male    DOB: 15-Feb-1934, 82 y.o.   MRN: [id number]  HPI He is here for an acute visit today for hip and leg pain.   Left hip pain:  He has had Intermittent left hip pain for a while, but it got worse after his accident with his toe 6 weeks ago. He sometimes has pain when laying on his left side or pushing on the lateral aspect of his hip. He denies any pain with walking or going upstairs.   Weakness in left leg:  6 weeks ago he tripped when mowing the lawn and injured one of his left toes. We did an x-ray at that time it was not broken. It was swollen and on occasion it still swells a little. It has improved. Since then he has had some weakness in his left leg, more specifically in the foot itself. He has difficulty lifting his foot completely and he has to concentrate when he walks to lift it. His ankle flexion is weak. He denies pain or weakness anyplace else in the leg. He denies any numbness or tingling.  He occasionally has left knee pain, but denies any swelling. The pain is not new.    Medications and allergies reviewed with patient and updated if appropriate.  Patient Active Problem List   Diagnosis Date Noted  . Toe pain, left 08/13/2016  . Myalgia 07/04/2016  . Mild cognitive impairment 07/03/2016  . Adjustment disorder with depressed mood 07/03/2016  . Frequency of urination 09/26/2014  . Macular degeneration 06/22/2011  . Fasting hyperglycemia 05/20/2009  . NONSPECIFIC ABNORMAL ELECTROCARDIOGRAM 05/20/2009  . THYROID CANCER, HX OF 05/20/2009  . History of colonic polyps 05/20/2009  . Hyperlipidemia 11/21/2007    Current Outpatient Prescriptions on File Prior to Visit  Medication Sig Dispense Refill  . escitalopram (LEXAPRO) 10 MG tablet Take 1 tablet (10 mg total) by mouth at bedtime. 90 tablet 1  . levothyroxine (SYNTHROID, LEVOTHROID) 150 MCG tablet TAKE 1 TABLET (150 mcg) BY MOUTH 3 days a week, 75 mcg 4 days a week  45 tablet 1  . omeprazole (PRILOSEC) 20 MG capsule Take 1 capsule (20 mg total) by mouth daily. 90 capsule 3  . rosuvastatin (CRESTOR) 20 MG tablet Take 1 tablet (20 mg total) by mouth daily. 90 tablet 3   No current facility-administered medications on file prior to visit.     Past Medical History:  Diagnosis Date  . Cataract   . Hyperlipidemia   . Thyroid cancer ([place])    PMH of; on supressive therapy    Past Surgical History:  Procedure Laterality Date  . CATARACT EXTRACTION, BILATERAL  2013 & 2014   Dr Celene Squibb  . colon polyps  2002 & 2005   negative 2008; Dr Earlean Shawl  . THYROIDECTOMY  2001   S/P RAI    Social History   Social History  . Marital status: Married    Spouse name: N/A  . Number of children: 2  . Years of education: N/A   Occupational History  . Retired    Social History Main Topics  . Smoking status: Former Smoker    Quit date: 12/07/1958  . Smokeless tobacco: Never Used     Comment: smoked 1956-1960, up 1 ppd  . Alcohol use 8.4 oz/week    14 Glasses of wine per week  . Drug use: No  . Sexual activity: Not on file   Other Topics Concern  .  Not on file   Social History Narrative   Exercise:  Yard work, walks on occasion                   Family History  Problem Relation Age of Onset  . Alzheimer's disease Mother   . Hypertension Mother   . Heart disease Father     CAD and angioplasty in 66s  . Cancer Father     Bladder  . Other Brother     valvular heart disease  . Diabetes Neg Hx   . Stroke Neg Hx     Review of Systems  Musculoskeletal: Positive for arthralgias. Negative for back pain, joint swelling and myalgias.       No muscle spasms  Skin: Negative for color change and rash.  Neurological: Positive for weakness (left leg). Negative for numbness.       Objective:   Vitals:   09/30/16 1111  BP: 130/84  Pulse: 65  Resp: 16  Temp: 97.9 F (36.6 C)   Filed Weights   09/30/16 1111  Weight: 187 lb (84.8 kg)   Body  mass index is 26.08 kg/m.   Physical Exam  Constitutional: He appears well-developed and well-nourished. No distress.  Musculoskeletal:  No lumbar spine deformity or lower back pain, pain with palpation of lateral left hip, no proximal left leg weakness, weakness with left foot flexion  Neurological:  Normal sensation LLE          Assessment & Plan:   See Problem List for Assessment and Plan of chronic medical problems.

## 2016-09-30 NOTE — Assessment & Plan Note (Signed)
Decreased crestor to every other day in July  Recheck lipid, cmp

## 2016-09-30 NOTE — Assessment & Plan Note (Signed)
Due for labs - ordered

## 2016-09-30 NOTE — Progress Notes (Signed)
Pre visit review using our clinic review tool, if applicable. No additional management support is needed unless otherwise documented below in the visit note. 

## 2016-09-30 NOTE — Assessment & Plan Note (Signed)
?   secondary to injury 6 weeks ago - stubbed toe while moving lawn Weakness with left foot flexion No improvement Will refer to Dr Tamala Julian for further evaluation

## 2016-10-04 ENCOUNTER — Encounter: Payer: Self-pay | Admitting: Internal Medicine

## 2016-10-06 DIAGNOSIS — R350 Frequency of micturition: Secondary | ICD-10-CM | POA: Diagnosis not present

## 2016-10-06 DIAGNOSIS — R351 Nocturia: Secondary | ICD-10-CM | POA: Diagnosis not present

## 2016-10-06 DIAGNOSIS — N3941 Urge incontinence: Secondary | ICD-10-CM | POA: Diagnosis not present

## 2016-10-06 DIAGNOSIS — N401 Enlarged prostate with lower urinary tract symptoms: Secondary | ICD-10-CM | POA: Diagnosis not present

## 2016-10-06 DIAGNOSIS — N3281 Overactive bladder: Secondary | ICD-10-CM | POA: Diagnosis not present

## 2016-10-10 NOTE — Progress Notes (Signed)
[name] [address], [place] [address] Phone: [phone] Subjective:    I'm seeing this patient by the request  of:  [name], MD  CC: Foot pain left hip pain  [id number]  Dean Pruitt is a 82 y.o. male coming in with complaint of foot pain or weakness. Patient states unfortunately he did have an injury going on 2 months ago. Patient did trip causing patient did injure his leg. Was signed by primary care provider that did have x-rays. X-rays at patient's foot did not show any bony abnormality. This was independently visualized by me. Since then patient states that he is almost noticed that he has a weakness of the foot. Patient denies though any true pain that stops him from activity.Patient states though that it feels like he is going to trip over his foot at all times.  Patient was also complaining of left hip pain. This is something going on before the fall. Seems to have increased since then. Was given oral anti-inflammatories by primary care provider. Patient states sometimes laying on the left side especially at night can be painful. Seems to stay fairly localized. Does not feel that this is associated with this leg. Patient states though that sometimes it can hurt all reviewed down to his knee. Affecting the way he walks a regular basis. Feels like he is having weakness. Patient also feels like he can trip at any point.    Past Medical History:  Diagnosis Date  . Cataract   . Hyperlipidemia   . Thyroid cancer ([place])    PMH of; on supressive therapy   Past Surgical History:  Procedure Laterality Date  . CATARACT EXTRACTION, BILATERAL  2013 & 2014   Dr Celene Squibb  . colon polyps  2002 & 2005   negative 2008; Dr Earlean Shawl  . THYROIDECTOMY  2001   S/P RAI   Social History   Social History  . Marital status: Married    Spouse name: N/A  . Number of children: 2  . Years of education: N/A   Occupational History  . Retired     Social History Main Topics  . Smoking status: Former Smoker    Quit date: 12/07/1958  . Smokeless tobacco: Never Used     Comment: smoked 1956-1960, up 1 ppd  . Alcohol use 8.4 oz/week    14 Glasses of wine per week  . Drug use: No  . Sexual activity: Not Asked   Other Topics Concern  . None   Social History Narrative   Exercise:  Yard work, walks on occasion                  No Known Allergies Family History  Problem Relation Age of Onset  . Alzheimer's disease Mother   . Hypertension Mother   . Heart disease Father     CAD and angioplasty in 60s  . Cancer Father     Bladder  . Other Brother     valvular heart disease  . Diabetes Neg Hx   . Stroke Neg Hx     Past medical history, social, surgical and family history all reviewed in electronic medical record.  No pertanent information unless stated regarding to the chief complaint.   Review of Systems:Review of systems updated and as accurate as of 10/12/16  No headache, visual changes, nausea, vomiting, diarrhea, constipation, dizziness, abdominal pain, skin rash, fevers, chills, night sweats, weight loss, swollen lymph nodes, body  aches, joint swelling, muscle aches, chest pain, shortness of breath, mood changes.   Objective  Blood pressure 118/82, pulse 64, height '5\' 10"'$  (1.778 m), weight 187 lb 6.4 oz (85 kg). Systems examined below as of 10/12/16   General: No apparent distress alert and oriented x3 mood and affect normal, dressed appropriately.  HEENT: Pupils equal, extraocular movements intact  Respiratory: Patient's speak in full sentences and does not appear short of breath  Cardiovascular: No lower extremity edema, non tender, no erythema  Skin: Warm dry intact with no signs of infection or rash on extremities or on axial skeleton.  Abdomen: Soft nontender  Neuro: Cranial nerves II through XII are intact, neurovascularly intact in all extremities with 2+ DTRs and 2+ pulses.  Lymph: No lymphadenopathy of  posterior or anterior cervical chain or axillae bilaterally.  Gait normal with good balance and coordination.  MSK:  Non tender with full range of motion and good stability and symmetric strength and tone of shoulders, elbows, wrist, , knee and ankles bilaterally. Mild arthritic changes of multiple joints JWL:KHVF ROM IR: 45 Deg, ER: 20 Deg, Flexion: 120 Deg, Extension: 100 Deg, Abduction: 45 Deg, Adduction: 45 Deg Strength IR: 5/5, ER: 5/5, Flexion: 5/5, Extension: 5/5, Abduction: 5/5, Adduction: 5/5 Pelvic alignment unremarkable to inspection and palpation. Standing hip rotation and gait without trendelenburg sign / unsteadiness. Greater trochanter without tenderness to palpation. No tenderness over piriformis and greater trochanter. No pain with FABER or FADIR. No SI joint tenderness and normal minimal SI movement. Patient does have atrophy of calf compared to contralateral side. Approximately quarter inch less in diameter compared to the right side  Foot exam shows patient does have a rigid midfoot bilaterally. Mild splaying between the first and second toes of the left leg. Patient does have weakness with dorsiflexion of the foot compared to the contralateral side.   Impression and Recommendations:     This case required medical decision making of moderate complexity.      Note: This dictation was prepared with Dragon dictation along with smaller phrase technology. Any transcriptional errors that result from this process are unintentional.

## 2016-10-12 ENCOUNTER — Encounter: Payer: Self-pay | Admitting: Family Medicine

## 2016-10-12 ENCOUNTER — Ambulatory Visit (INDEPENDENT_AMBULATORY_CARE_PROVIDER_SITE_OTHER): Payer: PPO | Admitting: Family Medicine

## 2016-10-12 ENCOUNTER — Ambulatory Visit (INDEPENDENT_AMBULATORY_CARE_PROVIDER_SITE_OTHER)
Admission: RE | Admit: 2016-10-12 | Discharge: 2016-10-12 | Disposition: A | Discharge status: Home / Self Care | Payer: PPO | Source: Ambulatory Visit | Attending: Family Medicine | Admitting: Family Medicine

## 2016-10-12 VITALS — BP 118/82 | HR 64 | Ht 70.0 in | Wt 187.4 lb

## 2016-10-12 DIAGNOSIS — M5416 Radiculopathy, lumbar region: Secondary | ICD-10-CM | POA: Insufficient documentation

## 2016-10-12 DIAGNOSIS — M21372 Foot drop, left foot: Secondary | ICD-10-CM | POA: Diagnosis not present

## 2016-10-12 DIAGNOSIS — M545 Low back pain: Secondary | ICD-10-CM | POA: Diagnosis not present

## 2016-10-12 DIAGNOSIS — S3992XA Unspecified injury of lower back, initial encounter: Secondary | ICD-10-CM | POA: Diagnosis not present

## 2016-10-12 MED ORDER — GABAPENTIN 100 MG PO CAPS: 200.0000 mg | ORAL_CAPSULE | Freq: Every day | ORAL | 3 refills | Status: DC

## 2016-10-12 MED ORDER — PREDNISONE 20 MG PO TABS: 40.0000 mg | ORAL_TABLET | Freq: Every day | ORAL | 0 refills | Status: DC

## 2016-10-12 NOTE — Patient Instructions (Signed)
Good to see you.  Xray downstairs today  If pain Ice 20 minutes 2 times daily. Usually after activity and before bed. Prednisone daily for 5 days.  Gabapentin '200mg'$  at night.  If groggy in AM go down to '100mg'$   Stay active.  Consider a heel lift in the toe of your shoe to help See me again in 7-10 days

## 2016-10-12 NOTE — Assessment & Plan Note (Signed)
Concern patient is having more of a lumbar radiculopathy causing weakness on the left side. Has caused some mild atrophy of the calf compared to the contralateral side. L5-S1 distribution would be accurate. X-rays ordered today, prednisone and gabapentin. Follow-up again in 7-10 days. Forcing symptoms advance imaging will be warranted. Patient knows when to seek medical attention.

## 2016-10-12 NOTE — Assessment & Plan Note (Signed)
I'm concerned that this left foot drop is secondary to more of a back issue. Patient is having weakness noted. Patient given prednisone and gabapentin and warned of potential side effects. We discussed adjusting shoes and how this can help with the symptoms. We discussed that advance imaging also may be warranted if patient continues to have weakness. Patient is in agreement with plan and will see me again in the next 7-10 days for further evaluation.

## 2016-10-13 ENCOUNTER — Other Ambulatory Visit: Payer: Self-pay | Admitting: *Deleted

## 2016-10-13 DIAGNOSIS — I714 Abdominal aortic aneurysm, without rupture, unspecified: Secondary | ICD-10-CM

## 2016-10-21 ENCOUNTER — Ambulatory Visit
Admission: RE | Admit: 2016-10-21 | Discharge: 2016-10-21 | Disposition: A | Discharge status: Home / Self Care | Payer: PPO | Source: Ambulatory Visit | Attending: Family Medicine | Admitting: Family Medicine

## 2016-10-21 DIAGNOSIS — I714 Abdominal aortic aneurysm, without rupture, unspecified: Secondary | ICD-10-CM

## 2016-10-22 ENCOUNTER — Other Ambulatory Visit: Payer: Self-pay | Admitting: Internal Medicine

## 2016-10-22 ENCOUNTER — Encounter: Payer: Self-pay | Admitting: Internal Medicine

## 2016-10-22 ENCOUNTER — Telehealth: Payer: Self-pay | Admitting: Internal Medicine

## 2016-10-22 DIAGNOSIS — J90 Pleural effusion, not elsewhere classified: Secondary | ICD-10-CM

## 2016-10-22 DIAGNOSIS — I714 Abdominal aortic aneurysm, without rupture, unspecified: Secondary | ICD-10-CM | POA: Insufficient documentation

## 2016-10-22 DIAGNOSIS — R222 Localized swelling, mass and lump, trunk: Secondary | ICD-10-CM

## 2016-10-22 NOTE — Telephone Encounter (Signed)
Call patient.  Let him know I spoke to Dr Tamala Julian regarding his recent abdominal US - there is fluid around his left lung and this needs to be evaluated further with a  Ct scan.  I have ordered this.  Let me know if he has any questions.

## 2016-10-23 ENCOUNTER — Ambulatory Visit (INDEPENDENT_AMBULATORY_CARE_PROVIDER_SITE_OTHER)
Admission: RE | Admit: 2016-10-23 | Discharge: 2016-10-23 | Disposition: A | Discharge status: Home / Self Care | Payer: PPO | Source: Ambulatory Visit | Attending: Internal Medicine | Admitting: Internal Medicine

## 2016-10-23 DIAGNOSIS — J90 Pleural effusion, not elsewhere classified: Secondary | ICD-10-CM

## 2016-10-23 MED ORDER — IOPAMIDOL (ISOVUE-300) INJECTION 61%
80.0000 mL | Freq: Once | INTRAVENOUS | Status: AC | PRN
  Administered 2016-10-23: 80 mL via INTRAVENOUS

## 2016-10-23 NOTE — Telephone Encounter (Signed)
Received call from [facility] radiology, report is up. Please advise.

## 2016-10-24 NOTE — Telephone Encounter (Signed)
Please call him - he has fluid around the left lung  - this may or may not be causing some shortness of breath.  He also had a nodule in his left lung.  This may be from an old infection,but there is a concern for possible cancer.  This needs to be followed up.  I would like him to see pulmonary upstairs for follow up - I have ordered a referral for their opinion regarding follow up.

## 2016-10-26 ENCOUNTER — Ambulatory Visit: Payer: PPO | Admitting: Neurology

## 2016-10-26 NOTE — Telephone Encounter (Signed)
Tried contacting pt, unable to LVM.   Friday 10/23/16, Dean Amsler, NP talked to pts wife as she insisted that she know the results before the weekend. States that if something happened to pt over the weekend that it would be on our office.

## 2016-10-27 ENCOUNTER — Encounter: Payer: Self-pay | Admitting: Internal Medicine

## 2016-10-27 ENCOUNTER — Ambulatory Visit (INDEPENDENT_AMBULATORY_CARE_PROVIDER_SITE_OTHER): Payer: PPO | Admitting: Internal Medicine

## 2016-10-27 VITALS — BP 142/88 | HR 65 | Ht 71.0 in | Wt 188.4 lb

## 2016-10-27 DIAGNOSIS — J90 Pleural effusion, not elsewhere classified: Secondary | ICD-10-CM

## 2016-10-27 NOTE — Assessment & Plan Note (Signed)
Asymptomatic chronic L effusion may date back to prior to jan 2017 ? Related to acute pulmonary infection then and if so likely just a late parapneumonic process but now has assoc LLL Atx and so next step is tap dry to see what the fluid looks like and see if the LLL re-expands.  Discussed in detail all the  indications, usual  risks and alternatives  relative to the benefits with patient who agrees to proceed with w/u as outlined    Total time devoted to counseling  = 35/89mreview case with pt/ wfe discussion of options/alternatives/ personally creating written instructions  in presence of pt  then going over those specific  Instructions directly with the pt including how to use all of the meds but in particular covering each new medication in detail and the difference between the maintenance/automatic meds and the prns using an action plan format for the latter.

## 2016-10-27 NOTE — Progress Notes (Signed)
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 AAA > u/s done 10/21/16 c/w Large Left effusion >  CT chest 10/23/16  > L effusion and referred to pulmonary clinic 10/27/2016 by Dr Quay Burow     10/27/2016 1st [facility] office visit/ Frimet Durfee   Chief Complaint  Patient presents with  . PULMONARY CONSULT    Referred by Dr Quay Burow for pulm nodule/pleural effusion. Recent CT Chest w/contrast 10/23/16.    last cxr 12/31/15 and showed elevation of L HD but no def effusion with no comparison studies - at that point had complained of ST, HA, gen weakness and malaise with green sputum rx  > rx by Dr Jenny Reichmann with zpak No symptoms whatsoever since that  cxr dated 12/31/15  then and no symptoms since.  Specifically no cp/cough/ sob at any time. Most concerned about new foot drop on L since fell but no back pain  No obvious day to day or daytime variability or assoc excess/ purulent sputum or mucus plugs or hemoptysis or cp or chest tightness, subjective wheeze or overt sinus  symptoms. No unusual exp hx or h/o childhood pna/ asthma or knowledge of premature birth.  Sleeping ok without nocturnal  or early am exacerbation  of respiratory  c/o's or need for noct saba. Also denies any obvious fluctuation of symptoms with weather or environmental changes or other aggravating or alleviating factors except as outlined above   Current Medications, Allergies, Complete Past Medical History, Past Surgical History, Family History, and Social History were reviewed in [employer] record.        Review of Systems  Constitutional: Negative for fever and unexpected weight change.  HENT: Negative for congestion, dental problem, ear pain,  nosebleeds, postnasal drip, rhinorrhea, sinus pressure, sneezing, sore throat and trouble swallowing.   Eyes: Negative for redness and itching.  Respiratory: Negative for cough, chest tightness, shortness of breath and wheezing.   Cardiovascular: Negative for palpitations and leg swelling.  Gastrointestinal: Negative for nausea and vomiting.       Acid heartburn  Genitourinary: Negative for dysuria.  Musculoskeletal: Negative for joint swelling.  Skin: Negative for rash.  Neurological: Negative for headaches.  Hematological: Does not bruise/bleed easily.  Psychiatric/Behavioral: Positive for dysphoric mood. The patient is not nervous/anxious.        Objective:   Physical Exam  Wt Readings from Last 3 Encounters:  10/27/16 188 lb 6.4 oz (85.5 kg)  10/12/16 187 lb 6.4 oz (85 kg)  09/30/16 187 lb (84.8 kg)    Vital signs reviewed - Note on arrival 02 sats  96% on RA     HEENT: nl dentition, turbinates, and oropharynx. Nl external ear canals without cough reflex   NECK :  without JVD/Nodes/TM/ nl carotid upstrokes bilaterally   LUNGS: no acc muscle use,  Nl contour chest which is clear to A and P bilaterally without cough on insp or exp maneuvers   CV:  RRR  no s3 or murmur or increase in P2, no edema   ABD:  soft and nontender with nl inspiratory excursion in the supine position. No bruits or organomegaly, bowel sounds nl  MS:  Nl gait/ ext warm without deformities, calf tenderness, cyanosis or clubbing No obvious joint restrictions   SKIN: warm and dry without lesions    NEURO:  alert, approp, nl sensorium with  Weakness L foot dorsiflexion, can't fully flex against gravity           Assessment & Plan:

## 2016-10-27 NOTE — Patient Instructions (Signed)
Please see patient coordinator before you leave today  to schedule Left thoracentesis

## 2016-11-02 ENCOUNTER — Telehealth: Payer: Self-pay | Admitting: Internal Medicine

## 2016-11-02 NOTE — Progress Notes (Signed)
[name] [address], [place] [address] Phone: [phone] Subjective:    I'm seeing this patient by the request  of:  [name], MD  CC: Foot pain left hip pain f/u   [id number]  Dean Pruitt is a 82 y.o. male coming in with complaint of foot pain or weakness. Patient was found to have some weakness of the lower extremity and there is concern that this is coming from the lumbar back. Patient was given prednisone and gabapentin. Patient did have x-rays of the lumbar spine done. X-rays were independently visualized by me. Patient was found to have mild to moderate degenerative disc disease and mild facet arthropathy at L4-S1. Patient also was found to have what appeared to be an abdominal aortic aneurysm measuring approximately 3.7 cm. Patient was sent for an abdominal ultrasound. Abdominal aneurysm seemed to be stable. Patient though did have a left pleural effusion. And I reviewed the chart patient had this pleural effusion for 10 months. Was sent for a CT of the chest. CT chest with contrast was also reviewed by me. Patient was found to have a moderate left pleural effusion as well as what appeared to be a nodule approximately 10 mm within the effusion of the left lobe. Patient is scheduled to have a paracentesis Which she had this morning.  Patient states he has noticed some improvement in leg strength. Unfortunately no improvement in the foot. Patient states any continues to be tripping from time to time over the foot. Feels like that has not made any significant improvement. Still denies any significant back pain with it. Patient denies any fevers, chills, any abnormal weight loss     Past Medical History:  Diagnosis Date  . Cataract   . Hyperlipidemia   . Thyroid cancer ([place])    PMH of; on supressive therapy   Past Surgical History:  Procedure Laterality Date  . CATARACT EXTRACTION, BILATERAL  2013 & 2014   Dr Celene Squibb  .  colon polyps  2002 & 2005   negative 2008; Dr Earlean Shawl  . THYROIDECTOMY  2001   S/P RAI   Social History   Social History  . Marital status: Married    Spouse name: N/A  . Number of children: 2  . Years of education: N/A   Occupational History  . Retired    Social History Main Topics  . Smoking status: Former Smoker    Packs/day: 1.00    Types: Cigarettes    Quit date: 12/07/1958  . Smokeless tobacco: Never Used     Comment: smoked 1956-1960, up 1 ppd  . Alcohol use 8.4 oz/week    14 Glasses of wine per week  . Drug use: No  . Sexual activity: Not Asked   Other Topics Concern  . None   Social History Narrative   Exercise:  Yard work, walks on occasion                  No Known Allergies Family History  Problem Relation Age of Onset  . Alzheimer's disease Mother   . Hypertension Mother   . Heart disease Father     CAD and angioplasty in 51s  . Cancer Father     Bladder  . Other Brother     valvular heart disease  . Diabetes Neg Hx   . Stroke Neg Hx     Past medical history, social, surgical and family history all reviewed in electronic medical  record.  No pertanent information unless stated regarding to the chief complaint.   Review of Systems:Review of systems updated and as accurate as of 11/03/16  No headache, visual changes, nausea, vomiting, diarrhea, constipation, dizziness, abdominal pain, skin rash, fevers, chills, night sweats, weight loss, swollen lymph nodes,  chest pain, shortness of breath, mood changes.   Objective  Blood pressure 138/88, pulse (!) 58, height 5' 10.5" (1.791 m), weight 184 lb (83.5 kg), SpO2 98 %. Systems examined below as of 11/03/16   General: No apparent distress alert and oriented x3 mood and affect normal, dressed appropriately.  HEENT: Pupils equal, extraocular movements intact  Respiratory: Patient's speak in full sentences and does not appear short of breath  Cardiovascular: No lower extremity edema, non tender, no  erythema  Skin: Warm dry intact with no signs of infection or rash on extremities or on axial skeleton.  Abdomen: Soft nontender  Neuro: Cranial nerves II through XII are intact, neurovascularly intact in all extremities with +1 DTRs on the left sign of the Achilles compared to the contralateral side Lymph: No lymphadenopathy of posterior or anterior cervical chain or axillae bilaterally.  Gait normal with good balance and coordination.  MSK:  Non tender with full range of motion and good stability and symmetric strength and tone of shoulders, elbows, wrist, , knee bilaterally. Mild arthritic changes of multiple joints UVQ:QUIV ROM IR: 45 Deg, ER: 20 Deg, Flexion: 120 Deg, Extension: 100 Deg, Abduction: 45 Deg, Adduction: 45 Deg Strength IR: 5/5, ER: 5/5, Flexion: 5/5, Extension: 5/5, Abduction: 5/5, Adduction: 5/5 Pelvic alignment unremarkable to inspection and palpation. Standing hip rotation and gait without trendelenburg sign / unsteadiness. Greater trochanter without tenderness to palpation. No tenderness over piriformis and greater trochanter. No pain with FABER or FADIR. No SI joint tenderness and normal minimal SI movement. Patient does have atrophy of calf compared to contralateral side. Approximately quarter inch less in diameter compared to the right side  Foot exam shows patient does have a rigid midfoot bilaterally. Mild splaying between the first and second toes of the left leg. Significant weakness with dorsiflexion of the foot compared to the contralateral sign still noted in the fin anything potentially worse.   Impression and Recommendations:     This case required medical decision making of moderate complexity.      Note: This dictation was prepared with Dragon dictation along with smaller phrase technology. Any transcriptional errors that result from this process are unintentional.

## 2016-11-02 NOTE — Telephone Encounter (Signed)
Attempted to contact pt. No answer, no option to leave a message. Will try back.  

## 2016-11-02 NOTE — Telephone Encounter (Signed)
Patient wife called back - very anxious - she would like to be called on [phone] -pr

## 2016-11-02 NOTE — Telephone Encounter (Signed)
Explained ca is in ddx but many other possible causes so rather than go over each one we only need to focus on the one he has and the first step in the w/u is to tap as rec at [facility]

## 2016-11-02 NOTE — Telephone Encounter (Signed)
Spoke with pt's wife. She was very anxious on the phone and was abrasive. She has several questions for MW about the pt. Pt's wife was mad that it has taken this long to get [place] on the phone. She wants to speak to MW only.  MW - can you please call the pt's wife at [phone]? Thanks.

## 2016-11-03 ENCOUNTER — Ambulatory Visit (INDEPENDENT_AMBULATORY_CARE_PROVIDER_SITE_OTHER): Payer: PPO | Admitting: Family Medicine

## 2016-11-03 ENCOUNTER — Ambulatory Visit (HOSPITAL_COMMUNITY)
Admission: RE | Admit: 2016-11-03 | Discharge: 2016-11-03 | Disposition: A | Discharge status: Home / Self Care | Payer: PPO | Source: Ambulatory Visit | Attending: General Surgery | Admitting: General Surgery

## 2016-11-03 ENCOUNTER — Ambulatory Visit (HOSPITAL_COMMUNITY)
Admission: RE | Admit: 2016-11-03 | Discharge: 2016-11-03 | Disposition: A | Discharge status: Home / Self Care | Payer: PPO | Source: Ambulatory Visit | Attending: Internal Medicine | Admitting: Internal Medicine

## 2016-11-03 ENCOUNTER — Encounter: Payer: Self-pay | Admitting: Family Medicine

## 2016-11-03 VITALS — BP 138/88 | HR 58 | Ht 70.5 in | Wt 184.0 lb

## 2016-11-03 DIAGNOSIS — Z9889 Other specified postprocedural states: Secondary | ICD-10-CM | POA: Insufficient documentation

## 2016-11-03 DIAGNOSIS — R846 Abnormal cytological findings in specimens from respiratory organs and thorax: Secondary | ICD-10-CM | POA: Diagnosis not present

## 2016-11-03 DIAGNOSIS — J90 Pleural effusion, not elsewhere classified: Secondary | ICD-10-CM

## 2016-11-03 DIAGNOSIS — M5416 Radiculopathy, lumbar region: Secondary | ICD-10-CM

## 2016-11-03 DIAGNOSIS — M21372 Foot drop, left foot: Secondary | ICD-10-CM

## 2016-11-03 DIAGNOSIS — J9811 Atelectasis: Secondary | ICD-10-CM | POA: Diagnosis not present

## 2016-11-03 LAB — LACTATE DEHYDROGENASE, PLEURAL OR PERITONEAL FLUID: LD FL: 97 U/L — AB (ref 3–23)

## 2016-11-03 LAB — BODY FLUID CELL COUNT WITH DIFFERENTIAL
Eos, Fluid: 0 %
LYMPHS FL: 31 %
Monocyte-Macrophage-Serous Fluid: 66 % (ref 50–90)
Neutrophil Count, Fluid: 3 % (ref 0–25)
Total Nucleated Cell Count, Fluid: 1074 cu mm — ABNORMAL HIGH (ref 0–1000)

## 2016-11-03 LAB — PROTEIN, BODY FLUID: TOTAL PROTEIN, FLUID: 3.7 g/dL

## 2016-11-03 LAB — GLUCOSE, SEROUS FLUID: GLUCOSE FL: 90 mg/dL

## 2016-11-03 MED ORDER — GABAPENTIN 300 MG PO CAPS: ORAL_CAPSULE | ORAL | 3 refills | Status: DC

## 2016-11-03 NOTE — Procedures (Signed)
Ultrasound-guided diagnostic and therapeutic left thoracentesis performed yielding 1.6 liters of yellow colored fluid. No immediate complications. Follow-up chest x-ray pending.      Dean Pruitt E 10:30 AM 11/03/2016

## 2016-11-03 NOTE — Assessment & Plan Note (Signed)
Patient is having worsening left foot drop. I do not believe the patient has made any significant improvement with the prednisone. Patient's nasal the gabapentin which may have helped some of the leg strength but very minimal. At this time I do feel that further imaging is warranted with patient having a history of thyroid cancer, pleural effusion recently with the potential nodule. Patient will being sent him for an MRI with and without contrast to see if there is any potential mass or nerve root impingement. Depending on findings we'll consider an epidural steroid injection or nerve root injection if this will be beneficial. Otherwise further treatment options we will discuss once more imaging is noted. In the interim patient will increase his gabapentin 300 mg with him not having any side effects. Patient denies any significant pain just is concern of the weakness.  Spent  25 minutes with patient face-to-face and had greater than 50% of counseling including as described above in assessment and plan.

## 2016-11-03 NOTE — Patient Instructions (Addendum)
Good to see you  Ice is your friend  Increase gabapentin to '300mg'$  nightly  Stay active and watch the stregnth, anything worsening give me a call  I will be in contact with you after the MRI.

## 2016-11-04 LAB — TRIGLYCERIDES, BODY FLUIDS: TRIGLYCERIDES FL: 26 mg/dL

## 2016-11-05 ENCOUNTER — Ambulatory Visit (INDEPENDENT_AMBULATORY_CARE_PROVIDER_SITE_OTHER): Payer: PPO | Admitting: Neurology

## 2016-11-05 ENCOUNTER — Other Ambulatory Visit (INDEPENDENT_AMBULATORY_CARE_PROVIDER_SITE_OTHER): Payer: PPO

## 2016-11-05 ENCOUNTER — Encounter: Payer: Self-pay | Admitting: Neurology

## 2016-11-05 VITALS — BP 140/78 | HR 71 | Ht 71.0 in | Wt 184.0 lb

## 2016-11-05 DIAGNOSIS — R413 Other amnesia: Secondary | ICD-10-CM

## 2016-11-05 DIAGNOSIS — F4323 Adjustment disorder with mixed anxiety and depressed mood: Secondary | ICD-10-CM | POA: Diagnosis not present

## 2016-11-05 DIAGNOSIS — N3281 Overactive bladder: Secondary | ICD-10-CM | POA: Diagnosis not present

## 2016-11-05 DIAGNOSIS — M21372 Foot drop, left foot: Secondary | ICD-10-CM | POA: Diagnosis not present

## 2016-11-05 LAB — VITAMIN B12: VITAMIN B 12: 196 pg/mL — AB (ref 211–911)

## 2016-11-05 NOTE — Progress Notes (Signed)
NEUROLOGY FOLLOW UP OFFICE NOTE  [place] [id number]  HISTORY OF PRESENT ILLNESS: Dean Pruitt is an 82 year old right-handed male with hyperlipidemia, elevated blood pressure and history of thyroid cancer s/p thyroidectomy who follows up for cognitive impairment.   UPDATE:  For mood, he was started on Lexapro '10mg'$  daily.  His wife has noticed improvement in that he is less irritable.  He still notes memory problems.  He is being evaluated by Sports Medicine for foot drop since August.  MRI of lumbar spine is ordered with plan to consider NCV-EMG if unrevealing.   HISTORY: His wife started noticing problems with memory about 6 or 7 months ago.  He recognizes memory problems too, but is not as concerned as his wife.  He started to become disoriented driving on familiar routes.  From church, he would be unsure how to get to known restaurants.  He had trouble driving to his daughter's house, as well.  Once in a while, hew would misplace things, such as his checkbook.  Over the span of a few months, he forgot to pay some pills three times.  He has since set up auto-payments.  This past year, he began to struggle with paying his taxes.  He sets up his pillbox for the week every week, which works out well.  He once saw a close friend from church whom he hasn't seen for several years.  He recognized him but couldn't remember his name.  Sleeping is poor.  He drinks 2 glasses of wine a day.  He does not exercise.  He denies depression, but he has some family stressors.  He has two adult children who are ill and he has a grandson with drug problems.   He underwent neuropsychological testing on 04/14/16.  Results were overall within normal limits, however there was evidence of mild executive dysfunction, as demonstrated with abnormalities in mental flexibility and set shifting, verbal fluency and clock drawing.  Although stress was likely the primary cause of his memory problems, these findings  also suggest an underlying non-amnestic mild cognitive impairment.    B12 from 02/13/16 was 326. MRI of brain from 02/26/16 revealed age-related generalized cerebral volume loss and mild nonspecific white matter changes.   He is a Programmer, systems. His mother had Alzheimer's disease.  PAST MEDICAL HISTORY: Past Medical History:  Diagnosis Date  . Cataract   . Hyperlipidemia   . Thyroid cancer [facility])    PMH of; on supressive therapy    MEDICATIONS: Current Outpatient Prescriptions on File Prior to Visit  Medication Sig Dispense Refill  . aspirin 81 MG tablet Take 81 mg by mouth daily.    Marland Kitchen escitalopram (LEXAPRO) 10 MG tablet Take 1 tablet (10 mg total) by mouth at bedtime. 90 tablet 1  . gabapentin (NEURONTIN) 300 MG capsule nightly 30 capsule 3  . levothyroxine (SYNTHROID, LEVOTHROID) 150 MCG tablet TAKE 1 TABLET (150 mcg) BY MOUTH 3 days a week, 75 mcg 4 days a week 45 tablet 1  . meloxicam (MOBIC) 7.5 MG tablet Take 1 tablet (7.5 mg total) by mouth daily. 30 tablet 2  . omeprazole (PRILOSEC) 20 MG capsule Take 1 capsule (20 mg total) by mouth daily. 90 capsule 3  . Probiotic Product (PROBIOTIC PO) Take 1 tablet by mouth daily.    . rosuvastatin (CRESTOR) 20 MG tablet Take 1 tablet (20 mg total) by mouth daily. (Patient taking differently: Take 20 mg by mouth every other day. ) 90  tablet 3   No current facility-administered medications on file prior to visit.     ALLERGIES: No Known Allergies  FAMILY HISTORY: Family History  Problem Relation Age of Onset  . Alzheimer's disease Mother   . Hypertension Mother   . Heart disease Father     CAD and angioplasty in 62s  . Cancer Father     Bladder  . Other Brother     valvular heart disease  . Diabetes Neg Hx   . Stroke Neg Hx     SOCIAL HISTORY: Social History   Social History  . Marital status: Married    Spouse name: N/A  . Number of children: 2  . Years of education: N/A   Occupational History  . Retired     Social History Main Topics  . Smoking status: Former Smoker    Packs/day: 1.00    Types: Cigarettes    Quit date: 12/07/1958  . Smokeless tobacco: Never Used     Comment: smoked 1956-1960, up 1 ppd  . Alcohol use 8.4 oz/week    14 Glasses of wine per week  . Drug use: No  . Sexual activity: Not on file   Other Topics Concern  . Not on file   Social History Narrative   Exercise:  Yard work, walks on occasion                   REVIEW OF SYSTEMS: Constitutional: No fevers, chills, or sweats, no generalized fatigue, change in appetite Eyes: No visual changes, double vision, eye pain Ear, nose and throat: No hearing loss, ear pain, nasal congestion, sore throat Cardiovascular: No chest pain, palpitations Respiratory:  No shortness of breath at rest or with exertion, wheezes GastrointestinaI: No nausea, vomiting, diarrhea, abdominal pain, fecal incontinence Genitourinary:  No dysuria, urinary retention or frequency Musculoskeletal:  No neck pain, back pain Integumentary: No rash, pruritus, skin lesions Neurological: as above Psychiatric: No depression, insomnia, anxiety Endocrine: No palpitations, fatigue, diaphoresis, mood swings, change in appetite, change in weight, increased thirst Hematologic/Lymphatic:  No purpura, petechiae. Allergic/Immunologic: no itchy/runny eyes, nasal congestion, recent allergic reactions, rashes  PHYSICAL EXAM: Vitals:   11/05/16 1055  BP: 140/78  Pulse: 71   General: No acute distress.  Patient appears well-groomed.  normal body habitus. Head:  Normocephalic/atraumatic Eyes:  Fundi examined but not visualized Neck: supple, no paraspinal tenderness, full range of motion Heart:  Regular rate and rhythm Lungs:  Clear to auscultation bilaterally Back: No paraspinal tenderness Neurological Exam: alert and oriented to person, place, and time. Attention span and concentration intact, recent and remote memory intact, fund of knowledge intact.   Speech fluent and not dysarthric, language intact.   MMSE - Mini Mental State Exam 11/05/2016 04/22/2016  Not completed: - Refused  Orientation to time 4 -  Orientation to Place 4 -  Registration 3 -  Attention/ Calculation 4 -  Recall 0 -  Language- name 2 objects 2 -  Language- repeat 1 -  Language- follow 3 step command 3 -  Language- read & follow direction 1 -  Write a sentence 1 -  Copy design 1 -  Total score 24 -   CN II-XII intact. Bulk and tone normal.  He has weakness of left ankle dorsiflexion and eversion.  Inversion and other muscle strength 5/5 is intact throughout.  Sensation to light touch  intact.  Deep tendon reflexes 2+ throughout.  Finger to nose testing intact.    IMPRESSION: 1.  Adjustment disorder with depressed mood/ Non-amnestic mild cognitive impairment 2.  Left foot drop, being evaluated by Dr. Tamala Julian. L5 radiculopathy possible but also consider common peroneal mononeuropathy (weak ankle dorsiflexion and eversion with spared ankle inversion).  MRI of lumbar spine pending.  Agree that if MRI is not revealing for L5 radiculopathy, then NCV-EMG would be next step.  In meantime, I advised not to cross legs when sitting.   PLAN: 1.  Continue Lexapro '10mg'$  daily.  Recommended counseling, but he is not ready for that. 2.  Recheck B12 level 3.  Repeat neurocognitive testing in 6 months with follow up afterwards. 4.  Mediterranean diet, social interaction, mental stimulation  25 minutes spent face to face with patient, over 50% spent counseling.  Metta Clines, DO  CC:  Billey Gosling, MD

## 2016-11-05 NOTE — Patient Instructions (Addendum)
1.  Continue Lexapro '10mg'$  daily 2.  Mediterranean diet  Mediterranean Diet A Mediterranean diet refers to food and lifestyle choices that are based on the traditions of countries located on the [employer]. This way of eating has been shown to help prevent certain conditions and improve outcomes for people who have chronic diseases, like kidney disease and heart disease. What are tips for following this plan? Lifestyle  Cook and eat meals together with your family, when possible.  Drink enough fluid to keep your urine clear or pale yellow.  Be physically active every day. This includes:  Aerobic exercise like running or swimming.  Leisure activities like gardening, walking, or housework.  Get 7-8 hours of sleep each night.  If recommended by your health care provider, drink red wine in moderation. This means 1 glass a day for nonpregnant women and 2 glasses a day for men. A glass of wine equals 5 oz (150 mL). Reading food labels  Check the serving size of packaged foods. For foods such as rice and pasta, the serving size refers to the amount of cooked product, not dry.  Check the total fat in packaged foods. Avoid foods that have saturated fat or trans fats.  Check the ingredients list for added sugars, such as corn syrup. Shopping  At the grocery store, buy most of your food from the areas near the walls of the store. This includes:  Fresh fruits and vegetables (produce).  Grains, beans, nuts, and seeds. Some of these may be available in unpackaged forms or large amounts (in bulk).  Fresh seafood.  Poultry and eggs.  Low-fat dairy products.  Buy whole ingredients instead of prepackaged foods.  Buy fresh fruits and vegetables in-season from local farmers markets.  Buy frozen fruits and vegetables in resealable bags.  If you do not have access to quality fresh seafood, buy precooked frozen shrimp or canned fish, such as tuna, salmon, or sardines.  Buy small  amounts of raw or cooked vegetables, salads, or olives from the deli or salad bar at your store.  Stock your pantry so you always have certain foods on hand, such as olive oil, canned tuna, canned tomatoes, rice, pasta, and beans. Cooking  Cook foods with extra-virgin olive oil instead of using butter or other vegetable oils.  Have meat as a side dish, and have vegetables or grains as your main dish. This means having meat in small portions or adding small amounts of meat to foods like pasta or stew.  Use beans or vegetables instead of meat in common dishes like chili or lasagna.  Experiment with different cooking methods. Try roasting or broiling vegetables instead of steaming or sauteing them.  Add frozen vegetables to soups, stews, pasta, or rice.  Add nuts or seeds for added healthy fat at each meal. You can add these to yogurt, salads, or vegetable dishes.  Marinate fish or vegetables using olive oil, lemon juice, garlic, and fresh herbs. Meal planning  Plan to eat 1 vegetarian meal one day each week. Try to work up to 2 vegetarian meals, if possible.  Eat seafood 2 or more times a week.  Have healthy snacks readily available, such as:  Vegetable sticks with hummus.  Greek yogurt.  Fruit and nut trail mix.  Eat balanced meals throughout the week. This includes:  Fruit: 2-3 servings a day  Vegetables: 4-5 servings a day  Low-fat dairy: 2 servings a day  Fish, poultry, or lean meat: 1 serving a day  Beans and legumes: 2 or more servings a week  Nuts and seeds: 1-2 servings a day  Whole grains: 6-8 servings a day  Extra-virgin olive oil: 3-4 servings a day  Limit red meat and sweets to only a few servings a month What are my food choices?  Mediterranean diet  Recommended  Grains: Whole-grain pasta. Brown rice. Bulgar wheat. Polenta. Couscous. Whole-wheat bread. [place].  Vegetables: Artichokes. Beets. Broccoli. Cabbage. Carrots. Eggplant. Green  beans. Chard. Kale. Spinach. Onions. Leeks. Peas. Squash. Tomatoes. Peppers. Radishes.  Fruits: Apples. Apricots. Avocado. Berries. Bananas. Cherries. Dates. Figs. Grapes. Lemons. Melon. Oranges. Peaches. Plums. Pomegranate.  Meats and other protein foods: Beans. Almonds. Sunflower seeds. Pine nuts. Peanuts. [place]. Salmon. Scallops. Shrimp. [place]. Tilapia. Clams. Oysters. Eggs.  Dairy: Low-fat milk. Cheese. Greek yogurt.  Beverages: Water. Red wine. Herbal tea.  Fats and oils: Extra virgin olive oil. Avocado oil. Grape seed oil.  Sweets and desserts: [place] yogurt with honey. Baked apples. Poached pears. Trail mix.  Seasoning and other foods: Basil. Cilantro. Coriander. Cumin. Mint. Parsley. Sage. Rosemary. Tarragon. Garlic. Oregano. Thyme. Pepper. Balsalmic vinegar. Tahini. Hummus. Tomato sauce. Olives. Mushrooms.  Limit these  Grains: Prepackaged pasta or rice dishes. Prepackaged cereal with added sugar.  Vegetables: Deep fried potatoes (french fries).  Fruits: Fruit canned in syrup.  Meats and other protein foods: Beef. Pork. Lamb. Poultry with skin. Hot dogs. Berniece Salines.  Dairy: Ice cream. Sour cream. Whole milk.  Beverages: Juice. Sugar-sweetened soft drinks. Beer. Liquor and spirits.  Fats and oils: Butter. Canola oil. Vegetable oil. Beef fat (tallow). Lard.  Sweets and desserts: Cookies. Cakes. Pies. Candy.  Seasoning and other foods: Mayonnaise. Premade sauces and marinades.  The items listed may not be a complete list. Talk with your dietitian about what dietary choices are right for you. Summary  The Mediterranean diet includes both food and lifestyle choices.  Eat a variety of fresh fruits and vegetables, beans, nuts, seeds, and whole grains.  Limit the amount of red meat and sweets that you eat.  Talk with your health care provider about whether it is safe for you to drink red wine in moderation. This means 1 glass a day for nonpregnant women and 2 glasses a day for  men. A glass of wine equals 5 oz (150 mL). This information is not intended to replace advice given to you by your health care provider. Make sure you discuss any questions you have with your health care provider. Document Released: 07/16/2016 Document Revised: 08/18/2016 Document Reviewed: 07/16/2016 Elsevier Interactive Patient Education  2017 [employer]. 5. DO NOT CROSS YOUR LEGS 6.  Consider speaking to a therapist 7.  Repeat testing with Dr. Si Raider in 6 months.  Follow up with me afterwards. 8.  I want to repeat B12 as well.

## 2016-11-06 ENCOUNTER — Telehealth: Payer: Self-pay

## 2016-11-06 ENCOUNTER — Other Ambulatory Visit: Payer: Self-pay | Admitting: Family Medicine

## 2016-11-06 DIAGNOSIS — Z77018 Contact with and (suspected) exposure to other hazardous metals: Secondary | ICD-10-CM

## 2016-11-06 NOTE — Telephone Encounter (Signed)
-----   Message from Pieter Partridge, DO sent at 11/06/2016  7:10 AM EST ----- B12 level is low.  Even though it is not the only cause of his memory problems, low B12 does contribute to memory problems.  I recommend treating with B12 shots:  1063mg daily for 7 days, then weekly for 4 weeks, then monthly for one year).

## 2016-11-06 NOTE — Telephone Encounter (Signed)
Message sent via mychart, will call on Monday if no response.

## 2016-11-06 NOTE — Progress Notes (Signed)
Spoke with pt and notified of results per Dr. Wert. Pt verbalized understanding and denied any questions. 

## 2016-11-09 ENCOUNTER — Ambulatory Visit
Admission: RE | Admit: 2016-11-09 | Discharge: 2016-11-09 | Disposition: A | Discharge status: Home / Self Care | Payer: PPO | Source: Ambulatory Visit | Attending: Family Medicine | Admitting: Family Medicine

## 2016-11-09 ENCOUNTER — Telehealth: Payer: Self-pay | Admitting: Internal Medicine

## 2016-11-09 DIAGNOSIS — Z01818 Encounter for other preprocedural examination: Secondary | ICD-10-CM | POA: Diagnosis not present

## 2016-11-09 DIAGNOSIS — M5416 Radiculopathy, lumbar region: Secondary | ICD-10-CM

## 2016-11-09 DIAGNOSIS — Z77018 Contact with and (suspected) exposure to other hazardous metals: Secondary | ICD-10-CM

## 2016-11-09 DIAGNOSIS — M21372 Foot drop, left foot: Secondary | ICD-10-CM

## 2016-11-09 DIAGNOSIS — M5126 Other intervertebral disc displacement, lumbar region: Secondary | ICD-10-CM | POA: Diagnosis not present

## 2016-11-09 MED ORDER — GADOBENATE DIMEGLUMINE 529 MG/ML IV SOLN
17.0000 mL | Freq: Once | INTRAVENOUS | Status: AC | PRN
  Administered 2016-11-09: 17 mL via INTRAVENOUS

## 2016-11-09 NOTE — Telephone Encounter (Signed)
Spoke with pt's wife, aware of results/recs.  Pt's wife states that pt has no recollection of the conversation between himself and Magda Paganini but notes that he is being evaluated by neuro for memory issues.  Verified appt on 12/15 with pt's wife.  Nothing further needed at this time.

## 2016-11-09 NOTE — Telephone Encounter (Signed)
Dean Pruitt already called on 12/1 : they are not specific and next step is to see if comes back or not. If doesn't come back we're done with w/u and if it does come back we need to cross that bridge when we come to it - nothing else to offer over the phone

## 2016-11-09 NOTE — Telephone Encounter (Signed)
Spoke with pt wife is very concerned about thoracentesis results from 11-03-16. Pt wife is very upset and concerned about these results as she was expecting to here from [place] on Friday.  MW please advise. Thanks.

## 2016-11-09 NOTE — Telephone Encounter (Signed)
Wife (anne) called worried about results, want to know if husband is ok, have to leave to pick up prescriptions and groceries....contact # [phone].Mearl Latin

## 2016-11-11 NOTE — Telephone Encounter (Signed)
Left message on machine for pt to return call to the office.  

## 2016-11-15 NOTE — Progress Notes (Signed)
[name] [place], [place] [address] Phone: [phone] Subjective:    I'm seeing this patient by the request  of:  [name], MD  CC: Foot pain left hip pain f/u   GOT:[id number]  Dean Pruitt is a 82 y.o. male coming in with complaint of foot pain or weakness. Patient was found to have some weakness of the lower extremity and there is concern that this is coming from the lumbar back. Patient was given prednisone and gabapentin. Patient did have x-rays of the lumbar spine done. X-rays were independently visualized by me. Patient was found to have mild to moderate degenerative disc disease and mild facet arthropathy at L4-S1. Patient also was found to have what appeared to be an abdominal aortic aneurysm measuring approximately 3.7 cm. Patient was sent for an abdominal ultrasound. Abdominal aneurysm seemed to be stable. Patient though did have a left pleural effusion. And I reviewed the chart patient had this pleural effusion for 10 months. Was sent for a CT of the chest. CT chest with contrast was also reviewed by me. Patient was found to have a moderate left pleural effusion as well as what appeared to be a nodule approximately 10 mm within the effusion of the left lobe. Patient did have a paracentesis. Awaiting to see if there is a accumulation. Patient is seen pulmonologist in 4 days.  Patient states he has noticed some improvement in leg strength. Continued to have weakness of the foot. MRI of the lumbar spine was taken 11/09/2016. This was independently visualized by me. Patient's does have a large epidural cyst expansion of the sacral canal and remodeling of S2 and S3 from a tarlov cyst. This appears large enough that they could cause some nerve compression.  11/16/16 update-  Continue with same symptoms, no improvement and no worsening.     Past Medical History:  Diagnosis Date  . Cataract   . Hyperlipidemia   . Thyroid cancer  ([facility])    PMH of; on supressive therapy   Past Surgical History:  Procedure Laterality Date  . CATARACT EXTRACTION, BILATERAL  2013 & 2014   Dr Celene Squibb  . colon polyps  2002 & 2005   negative 2008; Dr Earlean Shawl  . THYROIDECTOMY  2001   S/P RAI   Social History   Social History  . Marital status: Married    Spouse name: N/A  . Number of children: 2  . Years of education: N/A   Occupational History  . Retired    Social History Main Topics  . Smoking status: Former Smoker    Packs/day: 1.00    Types: Cigarettes    Quit date: 12/07/1958  . Smokeless tobacco: Never Used     Comment: smoked 1956-1960, up 1 ppd  . Alcohol use 8.4 oz/week    14 Glasses of wine per week  . Drug use: No  . Sexual activity: Not Asked   Other Topics Concern  . None   Social History Narrative   Exercise:  Yard work, walks on occasion                  No Known Allergies Family History  Problem Relation Age of Onset  . Alzheimer's disease Mother   . Hypertension Mother   . Heart disease Father     CAD and angioplasty in 42s  . Cancer Father     Bladder  . Other Brother     valvular heart  disease  . Diabetes Neg Hx   . Stroke Neg Hx     Past medical history, social, surgical and family history all reviewed in electronic medical record.  No pertanent information unless stated regarding to the chief complaint.   Review of Systems:Review of systems updated and as accurate as of 11/16/16 Review of Systems: No headache, visual changes, nausea, vomiting, diarrhea, constipation, dizziness, abdominal pain, skin rash, fevers, chills, night sweats, weight loss, swollen lymph nodes, , chest pain, shortness of breath, mood changes.    Objective  Blood pressure 118/82, pulse 67, height 5' 10.5" (1.791 m), weight 185 lb (83.9 kg), SpO2 96 %.   Systems examined below as of 11/16/16 General: NAD A&O x3 mood, affect normal  HEENT: Pupils equal, extraocular movements intact no nystagmus Respiratory:  not short of breath at rest or with speaking Cardiovascular: No lower extremity edema, non tender Skin: Warm dry intact with no signs of infection or rash on extremities or on axial skeleton. Abdomen: Soft nontender, no masses Neuro: Cranial nerves  intact, neurovascularly intact in all extremities with 2+ DTRs and 2+ pulses. Lymph: No lymphadenopathy appreciated today  Gait mild antalgicgait MSK:  Non tender with full range of motion and good stability and symmetric strength and tone of shoulders, elbows, wrist, , knee bilaterally. Mild arthritic changes of multiple joints RPZ:PSUG ROM IR: 25 Deg, ER: 20 Deg, Flexion: 120 Deg, Extension: 100 Deg, Abduction: 45 Deg, Adduction: 45 Deg Strength IR: 5/5, ER: 5/5, Flexion: 5/5, Extension: 5/5, Abduction: 545, Adduction: 4/5 Pelvic alignment unremarkable to inspection and palpation. Standing hip rotation and gait without trendelenburg sign / unsteadiness. Greater trochanter without tenderness to palpation. No tenderness over piriformis and greater trochanter. + faber left +  No SI joint tenderness and normal minimal SI movement. Patient does have atrophy of calf compared to contralateral side.   Foot exam shows patient does have a rigid midfoot bilaterally.    Impression and Recommendations:     This case required medical decision making of moderate complexity.      Note: This dictation was prepared with Dragon dictation along with smaller phrase technology. Any transcriptional errors that result from this process are unintentional.

## 2016-11-16 ENCOUNTER — Telehealth: Payer: Self-pay | Admitting: Neurology

## 2016-11-16 ENCOUNTER — Encounter: Payer: Self-pay | Admitting: Family Medicine

## 2016-11-16 ENCOUNTER — Ambulatory Visit (INDEPENDENT_AMBULATORY_CARE_PROVIDER_SITE_OTHER): Payer: PPO | Admitting: Family Medicine

## 2016-11-16 VITALS — BP 118/82 | HR 67 | Ht 70.5 in | Wt 185.0 lb

## 2016-11-16 DIAGNOSIS — M21372 Foot drop, left foot: Secondary | ICD-10-CM

## 2016-11-16 DIAGNOSIS — M5416 Radiculopathy, lumbar region: Secondary | ICD-10-CM | POA: Diagnosis not present

## 2016-11-16 NOTE — Telephone Encounter (Signed)
PT called in regards to B 12/Dawn CB# [phone]

## 2016-11-16 NOTE — Patient Instructions (Addendum)
Good to see you  We will do an epidural and see what type of response we get.  We will also send you to neurosurgery to discuss options.  Ice is your friend.  See mg again 2 weeks after injection  Happy holidays!

## 2016-11-16 NOTE — Assessment & Plan Note (Signed)
And does have findings consistent with nerve impingement. Patient will attempt an epidural first. Patient is also referred to neurosurgery secondary to the positioning of this. I do not know how much patient will respond to this. We discussed icing regimen and home exercises. Patient will continue to be active otherwise. Follow-up with me again in 2 weeks after the epidural and see how patient does. Continue the gabapentin.  Spent  25 minutes with patient face-to-face and had greater than 50% of counseling including as described above in assessment and plan.

## 2016-11-16 NOTE — Telephone Encounter (Signed)
Spoke with pt, he stated his wife called. She is not home. He will have her call back if she still has questions.

## 2016-11-17 ENCOUNTER — Ambulatory Visit (INDEPENDENT_AMBULATORY_CARE_PROVIDER_SITE_OTHER): Payer: PPO

## 2016-11-17 DIAGNOSIS — E538 Deficiency of other specified B group vitamins: Secondary | ICD-10-CM

## 2016-11-17 MED ORDER — CYANOCOBALAMIN 1000 MCG/ML IJ SOLN: 1000.0000 ug | INTRAMUSCULAR | 0 refills | Status: DC

## 2016-11-17 MED ORDER — CYANOCOBALAMIN 1000 MCG/ML IJ SOLN
1000.0000 ug | Freq: Once | INTRAMUSCULAR | Status: AC
  Administered 2016-11-17: 1000 ug via INTRAMUSCULAR

## 2016-11-17 MED ORDER — "NEEDLE (DISP) 25G X 1-1/2"" MISC": 1.0000 | 0 refills | Status: DC

## 2016-11-17 MED ORDER — "SYRINGE 25G X 1-1/2"" 3 ML MISC": 1.0000 | 0 refills | Status: DC

## 2016-11-18 ENCOUNTER — Ambulatory Visit (INDEPENDENT_AMBULATORY_CARE_PROVIDER_SITE_OTHER): Payer: PPO

## 2016-11-18 DIAGNOSIS — E538 Deficiency of other specified B group vitamins: Secondary | ICD-10-CM | POA: Diagnosis not present

## 2016-11-18 MED ORDER — CYANOCOBALAMIN 1000 MCG/ML IJ SOLN
1000.0000 ug | Freq: Once | INTRAMUSCULAR | Status: AC
  Administered 2016-11-18: 1000 ug via INTRAMUSCULAR

## 2016-11-19 ENCOUNTER — Ambulatory Visit (INDEPENDENT_AMBULATORY_CARE_PROVIDER_SITE_OTHER): Payer: PPO

## 2016-11-19 DIAGNOSIS — E538 Deficiency of other specified B group vitamins: Secondary | ICD-10-CM

## 2016-11-19 MED ORDER — CYANOCOBALAMIN 1000 MCG/ML IJ SOLN
1000.0000 ug | Freq: Once | INTRAMUSCULAR | Status: AC
  Administered 2016-11-19: 1000 ug via INTRAMUSCULAR

## 2016-11-20 ENCOUNTER — Telehealth: Payer: Self-pay | Admitting: Internal Medicine

## 2016-11-20 ENCOUNTER — Ambulatory Visit (INDEPENDENT_AMBULATORY_CARE_PROVIDER_SITE_OTHER): Payer: PPO

## 2016-11-20 ENCOUNTER — Encounter: Payer: Self-pay | Admitting: Internal Medicine

## 2016-11-20 ENCOUNTER — Ambulatory Visit (INDEPENDENT_AMBULATORY_CARE_PROVIDER_SITE_OTHER): Payer: PPO | Admitting: Internal Medicine

## 2016-11-20 ENCOUNTER — Ambulatory Visit (INDEPENDENT_AMBULATORY_CARE_PROVIDER_SITE_OTHER)
Admission: RE | Admit: 2016-11-20 | Discharge: 2016-11-20 | Disposition: A | Discharge status: Home / Self Care | Payer: PPO | Source: Ambulatory Visit | Attending: Internal Medicine | Admitting: Internal Medicine

## 2016-11-20 VITALS — BP 118/70 | HR 81 | Ht 70.5 in | Wt 192.0 lb

## 2016-11-20 DIAGNOSIS — J90 Pleural effusion, not elsewhere classified: Secondary | ICD-10-CM | POA: Diagnosis not present

## 2016-11-20 DIAGNOSIS — E538 Deficiency of other specified B group vitamins: Secondary | ICD-10-CM | POA: Diagnosis not present

## 2016-11-20 MED ORDER — CYANOCOBALAMIN 1000 MCG/ML IJ SOLN
1000.0000 ug | Freq: Once | INTRAMUSCULAR | Status: AC
  Administered 2016-11-20: 1000 ug via INTRAMUSCULAR

## 2016-11-20 NOTE — Progress Notes (Signed)
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 AAA > u/s done 10/21/16 c/w Large Left effusion >  CT chest 10/23/16  > L effusion and referred to pulmonary clinic 10/27/2016 by Dr Quay Burow    History of Present Illness  10/27/2016 1st [facility] office visit/ Liyat Faulkenberry   Chief Complaint  Patient presents with  . PULMONARY CONSULT    Referred by Dr Quay Burow for pulm nodule/pleural effusion. Recent CT Chest w/contrast 10/23/16.    last cxr 12/31/15 and showed elevation of L HD but no def effusion with no comparison studies - at that point had complained of ST, HA, gen weakness and malaise with green sputum rx  > rx by Dr Jenny Reichmann with zpak No symptoms whatsoever since that  cxr dated 12/31/15  then and no symptoms since.  Specifically no cp/cough/ sob at any time. Most concerned about new foot drop on L since fell but no back pain rec  Left thoracentesis  done 11/03/2016 >  1.6 liter exudate, wbc 1074 L > P, glucose nl,  Cyt> atypical mesothelial cells     11/20/2016  f/u ov/Agripina Guyette re: recurrent L effusions  ? Etiology  Chief Complaint  Patient presents with  . Follow-up    feels good today, no complaints   no orthopnea/ no pleuritic cp/ no doe   No obvious day to day or daytime variability or assoc chronic cough or cp or chest tightness, subjective wheeze or overt sinus or hb symptoms. No unusual exp hx or h/o childhood pna/ asthma or knowledge of premature birth.  Sleeping ok without nocturnal  or early am exacerbation  of respiratory  c/o's or need for noct saba. Also denies any obvious fluctuation of symptoms with weather or environmental changes or other aggravating or alleviating factors except as outlined  above   Current Medications, Allergies, Complete Past Medical History, Past Surgical History, Family History, and Social History were reviewed in [employer] record.  ROS  The following are not active complaints unless bolded sore throat, dysphagia, dental problems, itching, sneezing,  nasal congestion or excess/ purulent secretions, ear ache,   fever, chills, sweats, unintended wt loss, classically pleuritic or exertional cp, hemoptysis,  orthopnea pnd or leg swelling, presyncope, palpitations, abdominal pain, anorexia, nausea, vomiting, diarrhea  or change in bowel or bladder habits, change in stools or urine, dysuria,hematuria,  rash, arthralgias, visual complaints, headache, numbness, weakness or ataxia or problems with walking or coordination,  change in mood/affect or memory.                 Objective:   Physical Exam   .11/20/2016    192  10/27/16 188 lb 6.4 oz (85.5 kg)  10/12/16 187 lb 6.4 oz (85 kg)  09/30/16 187 lb (84.8 kg)    Vital signs reviewed  - Note on arrival 02 sats  95% on RA     HEENT: nl dentition, turbinates, and oropharynx. Nl external ear canals without cough reflex   NECK :  without JVD/Nodes/TM/ nl carotid upstrokes bilaterally   LUNGS: no acc muscle use,  Nl contour chest with decreased bs and dullness at  L base and 1/3 up    CV:  RRR  no s3 or murmur or increase in P2, no edema   ABD:  soft and nontender with nl inspiratory excursion in the supine position. No bruits or organomegaly, bowel sounds nl  MS:  Nl gait/ ext warm without deformities, calf tenderness, cyanosis or clubbing No obvious joint restrictions   SKIN: warm and dry without lesions    NEURO:  alert, approp, nl sensorium with  Weakness L foot dorsiflexion, can't fully flex against gravity     CXR PA and Lateral:   11/20/2016 :    I personally reviewed images and agree with radiology impression as follows:    Recurrent left pleural effusion.        Assessment & Plan:

## 2016-11-20 NOTE — Telephone Encounter (Signed)
Spoke with Dean Pruitt She states calling to give call report: recurrent left pleural effusion  MW already aware as this was discussed at ov  Nothing further needed

## 2016-11-20 NOTE — Assessment & Plan Note (Signed)
See CT chest 10/23/16  - Thoracentesis 11/03/2016 >  1.6 liter exudate, wbc 1074 L > P, glucose nl,  Cyt> atypical mesothelial cells  Most likely this is malignant and most likely source is lung though has not had PET scan yet  Since the holidays are upon [place] an his main concern is a back issue (which does not appear to represent a malignancy) rec retap p holidays for repeat t centesis then refer to T surgery   Discussed in detail all the  indications, usual  risks and alternatives  relative to the benefits with patient/wife who agree  to proceed with conservative f/u as outlined    I had an extended discussion with the patient/wife reviewing all relevant studies completed to date and  lasting 15 to 20 minutes of a 25 minute visit    Each maintenance medication was reviewed in detail including most importantly the difference between maintenance and prns and under what circumstances the prns are to be triggered using an action plan format that is not reflected in the computer generated alphabetically organized AVS.    Please see AVS for unique instructions that I personally wrote and verbalized to the the pt in detail and then reviewed with pt  by my nurse highlighting any  changes in therapy recommended at today's visit to their plan of care.

## 2016-11-20 NOTE — Patient Instructions (Signed)
Call if worse breathing lying flat or with exertion and we'll arrange repeat thoracentesis at that point and go from there as far as whether to refer to thoracic surgery depending on the findings

## 2016-11-23 ENCOUNTER — Ambulatory Visit (INDEPENDENT_AMBULATORY_CARE_PROVIDER_SITE_OTHER): Payer: PPO

## 2016-11-23 DIAGNOSIS — E538 Deficiency of other specified B group vitamins: Secondary | ICD-10-CM

## 2016-11-23 MED ORDER — CYANOCOBALAMIN 1000 MCG/ML IJ SOLN
1000.0000 ug | Freq: Once | INTRAMUSCULAR | Status: AC
  Administered 2016-11-23: 1000 ug via INTRAMUSCULAR

## 2016-11-24 ENCOUNTER — Ambulatory Visit (INDEPENDENT_AMBULATORY_CARE_PROVIDER_SITE_OTHER): Payer: PPO

## 2016-11-24 DIAGNOSIS — E538 Deficiency of other specified B group vitamins: Secondary | ICD-10-CM | POA: Diagnosis not present

## 2016-11-24 MED ORDER — CYANOCOBALAMIN 1000 MCG/ML IJ SOLN
1000.0000 ug | Freq: Once | INTRAMUSCULAR | Status: AC
  Administered 2016-11-24: 1000 ug via INTRAMUSCULAR

## 2016-11-25 ENCOUNTER — Telehealth: Payer: Self-pay | Admitting: Family Medicine

## 2016-11-25 ENCOUNTER — Ambulatory Visit (INDEPENDENT_AMBULATORY_CARE_PROVIDER_SITE_OTHER): Payer: PPO

## 2016-11-25 DIAGNOSIS — E538 Deficiency of other specified B group vitamins: Secondary | ICD-10-CM | POA: Diagnosis not present

## 2016-11-25 MED ORDER — CYANOCOBALAMIN 1000 MCG/ML IJ SOLN
1000.0000 ug | Freq: Once | INTRAMUSCULAR | Status: AC
  Administered 2016-11-25: 1000 ug via INTRAMUSCULAR

## 2016-11-25 NOTE — Telephone Encounter (Signed)
Patients wife [name] called to ask that you call her. She states that she spoke to [facility] and they are not telling her they are doing the same things that dr Tamala Julian advised. She thinks that they did not get all the right orders. Please give her a call.

## 2016-11-25 NOTE — Telephone Encounter (Signed)
Discussed with pt, made aware that [facility] has the correct orders. Pt understood.

## 2016-11-26 ENCOUNTER — Ambulatory Visit (INDEPENDENT_AMBULATORY_CARE_PROVIDER_SITE_OTHER): Payer: PPO

## 2016-11-26 DIAGNOSIS — E538 Deficiency of other specified B group vitamins: Secondary | ICD-10-CM

## 2016-11-26 MED ORDER — CYANOCOBALAMIN 1000 MCG/ML IJ SOLN
1000.0000 ug | Freq: Once | INTRAMUSCULAR | Status: AC
  Administered 2016-11-26: 1000 ug via INTRAMUSCULAR

## 2016-12-03 ENCOUNTER — Ambulatory Visit
Admission: RE | Admit: 2016-12-03 | Discharge: 2016-12-03 | Disposition: A | Discharge status: Home / Self Care | Payer: PPO | Source: Ambulatory Visit | Attending: Family Medicine | Admitting: Family Medicine

## 2016-12-03 ENCOUNTER — Ambulatory Visit (INDEPENDENT_AMBULATORY_CARE_PROVIDER_SITE_OTHER): Payer: PPO | Admitting: *Deleted

## 2016-12-03 ENCOUNTER — Telehealth: Payer: Self-pay | Admitting: Internal Medicine

## 2016-12-03 DIAGNOSIS — E538 Deficiency of other specified B group vitamins: Secondary | ICD-10-CM

## 2016-12-03 DIAGNOSIS — M5416 Radiculopathy, lumbar region: Secondary | ICD-10-CM

## 2016-12-03 DIAGNOSIS — J90 Pleural effusion, not elsewhere classified: Secondary | ICD-10-CM

## 2016-12-03 DIAGNOSIS — M47817 Spondylosis without myelopathy or radiculopathy, lumbosacral region: Secondary | ICD-10-CM | POA: Diagnosis not present

## 2016-12-03 MED ORDER — METHYLPREDNISOLONE ACETATE 40 MG/ML INJ SUSP (RADIOLOG
120.0000 mg | Freq: Once | INTRAMUSCULAR | Status: AC
  Administered 2016-12-03: 120 mg via EPIDURAL

## 2016-12-03 MED ORDER — IOPAMIDOL (ISOVUE-M 200) INJECTION 41%
1.0000 mL | Freq: Once | INTRAMUSCULAR | Status: AC
  Administered 2016-12-03: 1 mL via EPIDURAL

## 2016-12-03 MED ORDER — CYANOCOBALAMIN 1000 MCG/ML IJ SOLN
1000.0000 ug | Freq: Once | INTRAMUSCULAR | Status: AC
  Administered 2016-12-03: 1000 ug via INTRAMUSCULAR

## 2016-12-03 NOTE — Telephone Encounter (Signed)
Spoke with pt's wife .  She is calling to get pt scheduled for repeat thoracentesis now that holidays are over. Please advise.

## 2016-12-03 NOTE — Telephone Encounter (Signed)
That's fine > send to IR at [facility] for u/s guided t centesis for cytology only - if having worse symptoms (can't lie back) need to expedite otherwise wait until next week

## 2016-12-03 NOTE — Telephone Encounter (Signed)
Called and spoke with pts wife and she is aware of the order that has been placed and if the pt gets worse, take him to the ER for eval.

## 2016-12-03 NOTE — Discharge Instructions (Signed)

## 2016-12-08 ENCOUNTER — Ambulatory Visit (HOSPITAL_COMMUNITY)
Admission: RE | Admit: 2016-12-08 | Discharge: 2016-12-08 | Disposition: A | Discharge status: Home / Self Care | Payer: PPO | Source: Ambulatory Visit | Attending: Student | Admitting: Student

## 2016-12-08 ENCOUNTER — Ambulatory Visit (HOSPITAL_COMMUNITY)
Admission: RE | Admit: 2016-12-08 | Discharge: 2016-12-08 | Disposition: A | Discharge status: Home / Self Care | Payer: PPO | Source: Ambulatory Visit | Attending: Internal Medicine | Admitting: Internal Medicine

## 2016-12-08 DIAGNOSIS — I700 Atherosclerosis of aorta: Secondary | ICD-10-CM | POA: Diagnosis not present

## 2016-12-08 DIAGNOSIS — J90 Pleural effusion, not elsewhere classified: Secondary | ICD-10-CM | POA: Insufficient documentation

## 2016-12-08 DIAGNOSIS — J9811 Atelectasis: Secondary | ICD-10-CM | POA: Diagnosis not present

## 2016-12-08 DIAGNOSIS — Z9889 Other specified postprocedural states: Secondary | ICD-10-CM

## 2016-12-08 DIAGNOSIS — R846 Abnormal cytological findings in specimens from respiratory organs and thorax: Secondary | ICD-10-CM | POA: Diagnosis not present

## 2016-12-08 MED ORDER — LIDOCAINE HCL (PF) 1 % IJ SOLN
INTRAMUSCULAR | Status: AC
  Filled 2016-12-08: qty 10

## 2016-12-08 NOTE — Procedures (Signed)
Successful US guided diagnostic and therapeutic left thoracentesis. Yielded 1.8 L of yellow, clear fluid. Pt tolerated procedure well. No immediate complications.  Specimen was sent for labs. CXR ordered.  Docia Barrier PA-C 12/08/2016 3:38 PM

## 2016-12-09 ENCOUNTER — Telehealth: Payer: Self-pay | Admitting: Internal Medicine

## 2016-12-09 NOTE — Telephone Encounter (Signed)
One week with cxr

## 2016-12-09 NOTE — Telephone Encounter (Signed)
Spoke with pt's wife (dpr on file), aware of recs.  Ov scheduled for next Friday with TP.  Nothing further needed.

## 2016-12-09 NOTE — Telephone Encounter (Signed)
Spoke with pt. He wanted to know when MW wanted him to follow up after his thoracentesis. Thoracentesis was yesterday.  MW - please advise. Thanks.

## 2016-12-10 ENCOUNTER — Ambulatory Visit (INDEPENDENT_AMBULATORY_CARE_PROVIDER_SITE_OTHER): Payer: PPO

## 2016-12-10 DIAGNOSIS — E538 Deficiency of other specified B group vitamins: Secondary | ICD-10-CM | POA: Diagnosis not present

## 2016-12-10 MED ORDER — CYANOCOBALAMIN 1000 MCG/ML IJ SOLN
1000.0000 ug | Freq: Once | INTRAMUSCULAR | Status: AC
  Administered 2016-12-10: 1000 ug via INTRAMUSCULAR

## 2016-12-10 NOTE — Progress Notes (Signed)
Spoke with pt and notified of results per Dr. Wert. Pt verbalized understanding and denied any questions. 

## 2016-12-15 DIAGNOSIS — R350 Frequency of micturition: Secondary | ICD-10-CM | POA: Diagnosis not present

## 2016-12-15 DIAGNOSIS — N3941 Urge incontinence: Secondary | ICD-10-CM | POA: Diagnosis not present

## 2016-12-17 ENCOUNTER — Ambulatory Visit (INDEPENDENT_AMBULATORY_CARE_PROVIDER_SITE_OTHER): Payer: PPO | Admitting: *Deleted

## 2016-12-17 DIAGNOSIS — E538 Deficiency of other specified B group vitamins: Secondary | ICD-10-CM | POA: Diagnosis not present

## 2016-12-17 MED ORDER — CYANOCOBALAMIN 1000 MCG/ML IJ SOLN
1000.0000 ug | Freq: Once | INTRAMUSCULAR | Status: AC
  Administered 2016-12-17: 1000 ug via INTRAMUSCULAR

## 2016-12-17 MED ORDER — CYANOCOBALAMIN 1000 MCG/ML IJ SOLN: 1000.0000 ug | INTRAMUSCULAR | 0 refills | Status: DC

## 2016-12-17 NOTE — Progress Notes (Signed)
Patient here for B12 injection. Injected Cyanocobalamin '1000mg'$  into R Deltoid. Band-Aid applied. Patient tolerated well.

## 2016-12-18 ENCOUNTER — Ambulatory Visit (INDEPENDENT_AMBULATORY_CARE_PROVIDER_SITE_OTHER): Payer: PPO | Admitting: Adult Health

## 2016-12-18 ENCOUNTER — Ambulatory Visit (INDEPENDENT_AMBULATORY_CARE_PROVIDER_SITE_OTHER)
Admission: RE | Admit: 2016-12-18 | Discharge: 2016-12-18 | Disposition: A | Discharge status: Home / Self Care | Payer: PPO | Source: Ambulatory Visit | Attending: Adult Health | Admitting: Adult Health

## 2016-12-18 ENCOUNTER — Encounter: Payer: Self-pay | Admitting: Adult Health

## 2016-12-18 ENCOUNTER — Other Ambulatory Visit: Payer: Self-pay

## 2016-12-18 VITALS — BP 110/60 | HR 70 | Temp 97.9°F | Ht 71.0 in | Wt 186.6 lb

## 2016-12-18 DIAGNOSIS — J90 Pleural effusion, not elsewhere classified: Secondary | ICD-10-CM

## 2016-12-18 NOTE — Patient Instructions (Signed)
Follow up in 4 weeks with chest xray with Dr. Melvyn Novas   Please contact office for sooner follow up if symptoms do not improve or worsen or seek emergency care

## 2016-12-18 NOTE — Progress Notes (Signed)
$'@Patient'R$  ID: Dean Pruitt, male    DOB: [birth year], 83 y.o.   MRN: [id number]  Chief Complaint  Patient presents with  . 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 AAA > u/s done 10/21/16 c/w Large Left effusion >  CT chest 10/23/16  > L effusion and referred to pulmonary clinic 10/27/2016 by Dr Quay Burow  TEST   Thoracentesis 11/03/2016 =  1.6 liter exudate, wbc 1074 L > P, glucose nl,  Cyt> atypical mesothelial cells - Repeat Tcentesis 12/08/16 =  1.8 liter> THE ATYPICAL CELLS ARE FAVORED TO BE REACTIVE MESOTHELIAL CELLS.  12/18/16 Follow up : Left effusion  Pt returns for 1 month follow up for recurrent pleural effusion . He was seen for pulmonary consult 10/27/16 for pleural effusion noted on CT chest during workup for AAA. She was set up for thoracentesis 11/28 with 1.6L fluid removed, atypical mesothelial cells. Follow up chest xray showed reaccumulation of lfuid . He was set up for repeat throacentesis on 1/2 w/ 1.8 L removed, cytology showed atypical cells c/w reactive mesothelial cells.  Pt says he has no chest pain , pleuritic sx or cough .  Chest xray today shows only small left effusion , possibly decreased slightly .     No Known Allergies  Immunization History  Administered Date(s) Administered  . Pneumococcal Conjugate-13 07/03/2016    Past Medical History:  Diagnosis Date  . Cataract   . Hyperlipidemia   . Thyroid cancer ([place])    PMH of; on supressive therapy    Tobacco History: History  Smoking Status  . Former Smoker  . Packs/day: 1.00  . Types: Cigarettes  . Quit date: 12/07/1958  Smokeless Tobacco  . Never Used    Comment: smoked 1956-1960, up 1 ppd   Counseling given: Not Answered   Outpatient  Encounter Prescriptions as of 12/18/2016  Medication Sig  . aspirin 81 MG tablet Take 81 mg by mouth daily.  . cyanocobalamin (,VITAMIN B-12,) 1000 MCG/ML injection Inject 1 mL (1,000 mcg total) into the muscle as directed. Inject 1 mL into the muscle, IM, daily x 6 days, weekly x 4 weeks, monthly x 1 yr  . escitalopram (LEXAPRO) 10 MG tablet Take 1 tablet (10 mg total) by mouth at bedtime.  . gabapentin (NEURONTIN) 300 MG capsule nightly  . levothyroxine (SYNTHROID, LEVOTHROID) 150 MCG tablet TAKE 1 TABLET (150 mcg) BY MOUTH 3 days a week, 75 mcg 4 days a week  . meloxicam (MOBIC) 7.5 MG tablet Take 1 tablet (7.5 mg total) by mouth daily.  . rosuvastatin (CRESTOR) 20 MG tablet Take 1 tablet (20 mg total) by mouth daily. (Patient taking differently: Take 20 mg by mouth every other day. )  . [DISCONTINUED] NEEDLE, DISP, 25 G 25G X 1-1/2" MISC 1 each by Does not apply route as directed.  . [DISCONTINUED] Syringe/Needle, Disp, (SYRINGE 3CC/25GX1-1/2") 25G X 1-1/2" 3 ML MISC 1 each by Does not apply route as directed.   No facility-administered encounter medications on file as of 12/18/2016.      Review of Systems  Constitutional:   No  weight loss, night sweats,  Fevers, chills, fatigue, or  lassitude.  HEENT:  No headaches,  Difficulty swallowing,  Tooth/dental problems, or  Sore throat,                No sneezing, itching, ear ache, nasal congestion, post nasal drip,   CV:  No chest pain,  Orthopnea, PND, swelling in lower extremities, anasarca, dizziness, palpitations, syncope.   GI  No heartburn, indigestion, abdominal pain, nausea, vomiting, diarrhea, change in bowel habits, loss of appetite, bloody stools.   Resp: No shortness of breath with exertion or at rest.  No excess mucus, no productive cough,  No non-productive cough,  No coughing up of blood.  No change in color of mucus.  No wheezing.  No chest wall deformity  Skin: no rash or lesions.  GU: no dysuria, change in color of  urine, no urgency or frequency.  No flank pain, no hematuria   MS:  No joint pain or swelling.  No decreased range of motion.  No back pain.    Physical Exam  BP 110/60   Pulse 70   Temp 97.9 F (36.6 C) (Oral)   Ht '5\' 11"'$  (1.803 m)   Wt 186 lb 9.6 oz (84.6 kg)   SpO2 95%   BMI 26.03 kg/m   GEN: A/Ox3; pleasant , NAD, elderly    HEENT:  [place]/AT,  EACs-clear, TMs-wnl, NOSE-clear, THROAT-clear, no lesions, no postnasal drip or exudate noted.   NECK:  Supple w/ fair ROM; no JVD; normal carotid impulses w/o bruits; no thyromegaly or nodules palpated; no lymphadenopathy.    RESP  Clear  P & A; w/o, wheezes/ rales/ or rhonchi. no accessory muscle use, no dullness to percussion  CARD:  RRR, no m/r/g, no peripheral edema, pulses intact, no cyanosis or clubbing.  GI:   Soft & nt; nml bowel sounds; no organomegaly or masses detected.   Musco: Warm bil, no deformities or joint swelling noted.   Neuro: alert, no focal deficits noted.    Skin: Warm, no lesions or rashes  Psych:  No change in mood or affect. No depression or anxiety.  No memory loss.  Lab Results:  CBC    Component Value Date/Time   WBC 6.4 07/03/2016 1619   RBC 5.23 07/03/2016 1619   HGB 16.5 07/03/2016 1619   HCT 48.5 07/03/2016 1619   PLT 203.0 07/03/2016 1619   MCV 92.7 07/03/2016 1619   MCHC 34.0 07/03/2016 1619   RDW 13.1 07/03/2016 1619   LYMPHSABS 1.5 07/03/2016 1619   MONOABS 0.6 07/03/2016 1619   EOSABS 0.1 07/03/2016 1619   BASOSABS 0.0 07/03/2016 1619    BMET    Component Value Date/Time   NA 139 09/30/2016 1152   K 4.0 09/30/2016 1152   CL 103 09/30/2016 1152   CO2 28 09/30/2016 1152   GLUCOSE 99 09/30/2016 1152   BUN 10 09/30/2016 1152   CREATININE 0.81 09/30/2016 1152   CALCIUM 9.4 09/30/2016 1152   GFRNONAA 111.66 06/13/2010 0815   GFRAA 107 05/14/2008 0916    BNP No results found for: BNP  ProBNP No results found for: PROBNP  Imaging: Dg Chest 1 View  Result Date:  12/08/2016 CLINICAL DATA:  Status post thoracentesis EXAM: CHEST 1 VIEW COMPARISON:  November 20, 2016 FINDINGS: Left pleural effusion is significantly smaller following thoracentesis. Small left pleural effusion remains with mild left base atelectasis. Lungs elsewhere clear. Heart is mildly enlarged with pulmonary vascularity within normal limits. There is atherosclerotic calcification in the aorta. No adenopathy. There is postoperative change at the cervical-thoracic  junction in the midline. IMPRESSION: No pneumothorax. Small residual left pleural effusion with mild left base atelectasis. Lungs elsewhere clear. Stable cardiac silhouette. There is aortic atherosclerosis. Electronically Signed   By: Lowella Grip III M.D.   On: 12/08/2016 16:30   Dg Chest 2 View  Result Date: 12/18/2016 CLINICAL DATA:  Follow-up left-sided pleural effusion. EXAM: CHEST  2 VIEW COMPARISON:  12/08/2016 FINDINGS: Normal heart size. Stable aortic tortuosity. Proximal descending aorta appears dilated in the lateral projection but no aneurysm on chest CT 10/23/2016. Small left pleural effusion with probable subpulmonic component. Pleural fluid is stable if not diminished from 12/08/2016. No edema or pneumothorax. Thyroidectomy clips. IMPRESSION: Small left pleural effusion is stable or decreased from post thoracentesis film 12/08/2016. No new abnormality. Electronically Signed   By: Monte Fantasia M.D.   On: 12/18/2016 14:31   Dg Chest 2 View  Result Date: 11/20/2016 CLINICAL DATA:  Followup pleural effusion EXAM: CHEST  2 VIEW COMPARISON:  11/03/2016 FINDINGS: Cardiac shadow is again enlarged. Increasing left pleural effusion is noted when compared with the prior post thoracentesis film. No acute bony abnormality is seen. No focal infiltrate is noted although likely left basilar atelectasis is present. IMPRESSION: Recurrent left pleural effusion. Electronically Signed   By: Inez Catalina M.D.   On: 11/20/2016 14:28   Dg  Inject Diag/thera/inc Needle/cath/plc Epi/lumb/sac W/img  Result Date: 12/03/2016 CLINICAL DATA:  Spondylosis without myelopathy. Left hip to knee pain. No back pain. FLUOROSCOPY TIME:  0 minutes 16 seconds. 33.79 micro gray meter squared PROCEDURE: The procedure, risks, benefits, and alternatives were explained to the patient. Questions regarding the procedure were encouraged and answered. The patient understands and consents to the procedure. LUMBAR EPIDURAL INJECTION: An interlaminar approach was performed on left at L5-S1. The overlying skin was cleansed and anesthetized. A 20 gauge epidural needle was advanced using loss-of-resistance technique. DIAGNOSTIC EPIDURAL INJECTION: Injection of Isovue-M 200 shows a good epidural pattern with spread above and below the level of needle placement, primarily on the left no vascular opacification is seen. THERAPEUTIC EPIDURAL INJECTION: One hundred twenty Mg of Depo-Medrol mixed with 2.5 cc 1% lidocaine were instilled. The procedure was well-tolerated, and the patient was discharged thirty minutes following the injection in good condition. COMPLICATIONS: None IMPRESSION: Technically successful epidural injection on the left at L5-S1 # 1. Electronically Signed   By: Nelson Chimes M.D.   On: 12/03/2016 11:39   US Thoracentesis Asp Pleural Space W/img Guide  Result Date: 12/08/2016 INDICATION: Patient with asymptomatic chronic left pleural effusion. Request is made for a diagnostic and therapeutic thoracentesis. EXAM: ULTRASOUND GUIDED DIAGNOSTIC AND THERAPEUTIC THORACENTESIS MEDICATIONS: 10 mL 1% lidocaine COMPLICATIONS: None immediate. PROCEDURE: An ultrasound guided thoracentesis was thoroughly discussed with the patient and questions answered. The benefits, risks, alternatives and complications were also discussed. The patient understands and wishes to proceed with the procedure. Written consent was obtained. Ultrasound was performed to localize and mark an adequate  pocket of fluid in the left chest. The area was then prepped and draped in the normal sterile fashion. 1% Lidocaine was used for local anesthesia. Under ultrasound guidance a Safe-T-Centesis catheter was introduced. After 1.2 liters of fluid obtained, Safe-T-Centesis no longer functioning. [place] of left chest demonstrated pleural fluid still present. Safe-T-Centesis removed. A 7-cm Yeuh catheter was inserted along the same track into the left chest with fluid return. The catheter was removed and a dressing applied. FINDINGS: A total of approximately 1.8 liters of yellow fluid was removed. Samples were  sent to the laboratory as requested by the clinical team. IMPRESSION: Successful ultrasound guided diagnostic and therapeutic left thoracentesis yielding 1.8 liters of pleural fluid. Read by:  Brynda Greathouse PA-C Electronically Signed   By: Jacqulynn Cadet M.D.   On: 12/08/2016 15:46     Assessment & Plan:   No problem-specific Assessment & Plan notes found for this encounter.     Rexene Edison, NP 12/18/2016

## 2016-12-22 DIAGNOSIS — G5732 Lesion of lateral popliteal nerve, left lower limb: Secondary | ICD-10-CM | POA: Diagnosis not present

## 2016-12-22 DIAGNOSIS — N3941 Urge incontinence: Secondary | ICD-10-CM | POA: Diagnosis not present

## 2016-12-22 DIAGNOSIS — R350 Frequency of micturition: Secondary | ICD-10-CM | POA: Diagnosis not present

## 2016-12-22 NOTE — Progress Notes (Deleted)
[name] [place], [place] [address] Phone: [phone] Subjective:    I'm seeing this patient by the request  of:  [name], MD  CC: Foot pain left hip pain f/u   [id number]  Dean Pruitt is a 83 y.o. male coming in with complaint of foot pain or weakness. Patient was found to have some weakness of the lower extremity and there is concern that this is coming from the lumbar back. Patient was given prednisone and gabapentin. Patient did have x-rays of the lumbar spine done. X-rays were independently visualized by me. Patient was found to have mild to moderate degenerative disc disease and mild facet arthropathy at L4-S1. Patient also was found to have what appeared to be an abdominal aortic aneurysm measuring approximately 3.7 cm. Patient was sent for an abdominal ultrasound. Abdominal aneurysm seemed to be stable. Patient though did have a left pleural effusion. And I reviewed the chart patient had this pleural effusion for 10 months. Was sent for a CT of the chest. CT chest with contrast was also reviewed by me. Patient was found to have a moderate left pleural effusion as well as what appeared to be a nodule approximately 10 mm within the effusion of the left lobe. Patient did have a paracentesis. Patient did have reaccumulation and did have a no apparent thoracentesis done on 12/08/2016. Patient did have repeat chest x-rays done 12/18/2016 that did not show any reaccumulation.   Patient states he has noticed some improvement in leg strength. Continued to have weakness of the foot. MRI of the lumbar spine was taken 11/09/2016. This was independently visualized by me. Patient's does have a large epidural cyst expansion of the sacral canal and remodeling of S2 and S3 from a tarlov cyst. This appears large enough that they could cause some nerve compression.  12/23/2016 update Patient at last exam was not making any significant  improvement and was sent for an epidural steroid injection. This was accomplished on 12/03/2016. patient states.    Past Medical History:  Diagnosis Date  . Cataract   . Hyperlipidemia   . Thyroid cancer ([place])    PMH of; on supressive therapy   Past Surgical History:  Procedure Laterality Date  . CATARACT EXTRACTION, BILATERAL  2013 & 2014   Dr Celene Squibb  . colon polyps  2002 & 2005   negative 2008; Dr Earlean Shawl  . THYROIDECTOMY  2001   S/P RAI   Social History   Social History  . Marital status: Married    Spouse name: N/A  . Number of children: 2  . Years of education: N/A   Occupational History  . Retired    Social History Main Topics  . Smoking status: Former Smoker    Packs/day: 1.00    Types: Cigarettes    Quit date: 12/07/1958  . Smokeless tobacco: Never Used     Comment: smoked 1956-1960, up 1 ppd  . Alcohol use 8.4 oz/week    14 Glasses of wine per week  . Drug use: No  . Sexual activity: Not on file   Other Topics Concern  . Not on file   Social History Narrative   Exercise:  Yard work, walks on occasion                  No Known Allergies Family History  Problem Relation Age of Onset  . Alzheimer's disease Mother   . Hypertension Mother   .  Heart disease Father     CAD and angioplasty in 47s  . Cancer Father     Bladder  . Other Brother     valvular heart disease  . Diabetes Neg Hx   . Stroke Neg Hx     Past medical history, social, surgical and family history all reviewed in electronic medical record.  No pertanent information unless stated regarding to the chief complaint.   Review of Systems:Review of systems updated and as accurate as of 12/22/16 Review of Systems: No headache, visual changes, nausea, vomiting, diarrhea, constipation, dizziness, abdominal pain, skin rash, fevers, chills, night sweats, weight loss, swollen lymph nodes, , chest pain, shortness of breath, mood changes.    Objective  There were no vitals taken for this  visit.   Systems examined below as of 12/22/16 General: NAD A&O x3 mood, affect normal  HEENT: Pupils equal, extraocular movements intact no nystagmus Respiratory: not short of breath at rest or with speaking Cardiovascular: No lower extremity edema, non tender Skin: Warm dry intact with no signs of infection or rash on extremities or on axial skeleton. Abdomen: Soft nontender, no masses Neuro: Cranial nerves  intact, neurovascularly intact in all extremities with 2+ DTRs and 2+ pulses. Lymph: No lymphadenopathy appreciated today  Gait mild antalgicgait MSK:  Non tender with full range of motion and good stability and symmetric strength and tone of shoulders, elbows, wrist, , knee bilaterally. Mild arthritic changes of multiple joints VTX:[id number] ROM IR: 25 Deg, ER: 20 Deg, Flexion: 120 Deg, Extension: 100 Deg, Abduction: 45 Deg, Adduction: 45 Deg Strength IR: 5/5, ER: 5/5, Flexion: 5/5, Extension: 5/5, Abduction: 545, Adduction: 4/5 Pelvic alignment unremarkable to inspection and palpation. Standing hip rotation and gait without trendelenburg sign / unsteadiness. Greater trochanter without tenderness to palpation. No tenderness over piriformis and greater trochanter. + faber left +  No SI joint tenderness and normal minimal SI movement. Patient does have atrophy of calf compared to contralateral side.   Foot exam shows patient does have a rigid midfoot bilaterally.    Impression and Recommendations:     This case required medical decision making of moderate complexity.      Note: This dictation was prepared with Dragon dictation along with smaller phrase technology. Any transcriptional errors that result from this process are unintentional.

## 2016-12-23 ENCOUNTER — Ambulatory Visit: Payer: PPO | Admitting: Family Medicine

## 2016-12-25 ENCOUNTER — Ambulatory Visit (INDEPENDENT_AMBULATORY_CARE_PROVIDER_SITE_OTHER): Payer: PPO

## 2016-12-25 DIAGNOSIS — E538 Deficiency of other specified B group vitamins: Secondary | ICD-10-CM

## 2016-12-25 MED ORDER — CYANOCOBALAMIN 1000 MCG/ML IJ SOLN
1000.0000 ug | Freq: Once | INTRAMUSCULAR | Status: AC
  Administered 2016-12-25: 1000 ug via INTRAMUSCULAR

## 2016-12-25 NOTE — Progress Notes (Signed)
Patient here for B12 injection. Gave Cyanocobalamin 1064mg in L Deltoid. Patient tolerated well. Band-Aid applied.

## 2016-12-25 NOTE — Assessment & Plan Note (Signed)
Recurrent left pleural effusion s/p throacentesis x 2 , cytology showed reactive cells.  CXR today is improved with no reaccumlaiton noted follow up in 4 weeks with repeat cxr .

## 2016-12-26 NOTE — Progress Notes (Signed)
Chart and office note reviewed in detail along with available xrays/ labs > agree with a/p as outlined  

## 2016-12-29 DIAGNOSIS — N3941 Urge incontinence: Secondary | ICD-10-CM | POA: Diagnosis not present

## 2016-12-29 DIAGNOSIS — R350 Frequency of micturition: Secondary | ICD-10-CM | POA: Diagnosis not present

## 2016-12-31 ENCOUNTER — Ambulatory Visit: Payer: Self-pay

## 2016-12-31 ENCOUNTER — Ambulatory Visit (INDEPENDENT_AMBULATORY_CARE_PROVIDER_SITE_OTHER): Payer: PPO | Admitting: Family Medicine

## 2016-12-31 ENCOUNTER — Encounter: Payer: Self-pay | Admitting: Family Medicine

## 2016-12-31 ENCOUNTER — Ambulatory Visit (INDEPENDENT_AMBULATORY_CARE_PROVIDER_SITE_OTHER): Payer: PPO

## 2016-12-31 VITALS — BP 122/80 | HR 71 | Ht 71.0 in | Wt 197.0 lb

## 2016-12-31 DIAGNOSIS — M21372 Foot drop, left foot: Secondary | ICD-10-CM

## 2016-12-31 DIAGNOSIS — M5416 Radiculopathy, lumbar region: Secondary | ICD-10-CM | POA: Diagnosis not present

## 2016-12-31 DIAGNOSIS — M25552 Pain in left hip: Secondary | ICD-10-CM

## 2016-12-31 DIAGNOSIS — M7062 Trochanteric bursitis, left hip: Secondary | ICD-10-CM

## 2016-12-31 DIAGNOSIS — E538 Deficiency of other specified B group vitamins: Secondary | ICD-10-CM | POA: Diagnosis not present

## 2016-12-31 MED ORDER — CYANOCOBALAMIN 1000 MCG/ML IJ SOLN: 1000.0000 ug | Freq: Once | INTRAMUSCULAR | Status: DC

## 2016-12-31 MED ORDER — CYANOCOBALAMIN 1000 MCG/ML IJ SOLN
1000.0000 ug | Freq: Once | INTRAMUSCULAR | Status: AC
  Administered 2016-12-31: 1000 ug via INTRAMUSCULAR

## 2016-12-31 NOTE — Assessment & Plan Note (Signed)
Patient was given an injection today. Tolerated the procedure well. Encourage him to continue the gabapentin with how patient is responding. We discussed icing regimen. Topical anti-inflammatory

## 2016-12-31 NOTE — Assessment & Plan Note (Signed)
Radicular symptoms to be improved after patient having the epidural. We discussed with patient if this continues to improve then we'll consider monitoring. Patient encouraged to continue to stay active. Potentially starting to increase activity.

## 2016-12-31 NOTE — Assessment & Plan Note (Signed)
Resolved at this time. The epidural did help. We will continue to monitor.

## 2016-12-31 NOTE — Patient Instructions (Signed)
Good to see you I am glad you are doing better overall.  We tried to inject the hip today and I hope it helps Stay active and start walking again.  We can repeat epidurals if needed.  See me again in 4-6 weeks.

## 2016-12-31 NOTE — Progress Notes (Signed)
Dean Pruitt, [place] [address] Phone: [phone] Subjective:    I'm seeing this patient by the request  of:  [name], MD  CC: Foot pain left hip pain f/u   [id number]  Dean Pruitt is a 83 y.o. male coming in with complaint of foot pain or weakness. Patient was found to have some weakness of the lower extremity and there is concern that this is coming from the lumbar back. Patient was given prednisone and gabapentin. Patient did have x-rays of the lumbar spine done. X-rays were independently visualized by me. Patient was found to have mild to moderate degenerative disc disease and mild facet arthropathy at L4-S1. Patient also was found to have what appeared to be an abdominal aortic aneurysm measuring approximately 3.7 cm. Patient was sent for an abdominal ultrasound. Abdominal aneurysm seemed to be stable. Patient though did have a left pleural effusion. And I reviewed the chart patient had this pleural effusion for 10 months. Was sent for a CT of the chest. CT chest with contrast was also reviewed by me. Patient was found to have a moderate left pleural effusion as well as what appeared to be a nodule approximately 10 mm within the effusion of the left lobe. Patient did have a paracentesis. Patient did have reaccumulation and did have a no apparent thoracentesis done on 12/08/2016. Patient did have repeat chest x-rays done 12/18/2016 that did not show any reaccumulation.   Patient states he has noticed some improvement in leg strength. Continued to have weakness of the foot. MRI of the lumbar spine was taken 11/09/2016. This was independently visualized by me. Patient's does have a large epidural cyst expansion of the sacral canal and remodeling of S2 and S3 from a tarlov cyst. This appears large enough that they could cause some nerve compression.  12/31/2016 update Patient at last exam was not making any significant  improvement and was sent for an epidural steroid injection. This was accomplished on 12/03/2016. patient states he did make some improvement. Patient did seem neurosurgery who stated that they do not went to do any type of intervention with the nurse pain wrapped around the cyst itself. Patient states that all the pain now seems to be localized mostly over the lateral hip. No significant pain radiating down the leg. States that the pain in the foot has completely resolved as well. Patient states that if she he did not have the pain on the lateral aspect the hip he would be doing significantly better.    Past Medical History:  Diagnosis Date  . Cataract   . Hyperlipidemia   . Thyroid cancer ([place])    PMH of; on supressive therapy   Past Surgical History:  Procedure Laterality Date  . CATARACT EXTRACTION, BILATERAL  2013 & 2014   Dr Celene Squibb  . colon polyps  2002 & 2005   negative 2008; Dr Earlean Shawl  . THYROIDECTOMY  2001   S/P RAI   Social History   Social History  . Marital status: Married    Spouse name: N/A  . Number of children: 2  . Years of education: N/A   Occupational History  . Retired    Social History Main Topics  . Smoking status: Former Smoker    Packs/day: 1.00    Types: Cigarettes    Quit date: 12/07/1958  . Smokeless tobacco: Never Used     Comment: smoked 1956-1960, up 1 ppd  . Alcohol  use 8.4 oz/week    14 Glasses of wine per week  . Drug use: No  . Sexual activity: Not Asked   Other Topics Concern  . None   Social History Narrative   Exercise:  Yard work, walks on occasion                  No Known Allergies Family History  Problem Relation Age of Onset  . Alzheimer's disease Mother   . Hypertension Mother   . Heart disease Father     CAD and angioplasty in 24s  . Cancer Father     Bladder  . Other Brother     valvular heart disease  . Diabetes Neg Hx   . Stroke Neg Hx     Past medical history, social, surgical and family history all  reviewed in electronic medical record.  No pertanent information unless stated regarding to the chief complaint.   Review of Systems: No headache, visual changes, nausea, vomiting, diarrhea, constipation, dizziness, abdominal pain, skin rash, fevers, chills, night sweats, weight loss, swollen lymph nodes, body aches,chest pain, shortness of breath, mood changes.  Mild positive muscle aches   Objective  Blood pressure 122/80, pulse 71, height '5\' 11"'$  (1.803 m), weight 197 lb (89.4 kg), SpO2 96 %.   Systems examined below as of 12/31/16 General: NAD A&O x3 mood, affect normal  HEENT: Pupils equal, extraocular movements intact no nystagmus Respiratory: not short of breath at rest or with speaking Cardiovascular: No lower extremity edema, non tender Skin: Warm dry intact with no signs of infection or rash on extremities or on axial skeleton. Abdomen: Soft nontender, no masses Neuro: Cranial nerves  intact, neurovascularly intact in all extremities with 2+ DTRs and 2+ pulses. Lymph: No lymphadenopathy appreciated today    Gait normal gait MSK:  Non tender with full range of motion and good stability and symmetric strength and tone of shoulders, elbows, wrist, , knee bilaterally. Mild arthritic changes of multiple joints Hip: Left ROM IR: 25 Deg, ER: 45 Deg, Flexion: 120 Deg, Extension: 100 Deg, Abduction: 45 Deg, Adduction: 25 Deg Strength IR: 5/5, ER: 5/5, Flexion: 5/5, Extension: 5/5, Abduction: 5/5, Adduction: 5/5 Pelvic alignment unremarkable to inspection and palpation. Standing hip rotation and gait without trendelenburg sign / unsteadiness. Moderate tenderness to palpation over the greater trochanter area. Positive Faber left No SI joint tenderness and normal minimal SI movement.   Procedure: Real-time Ultrasound Guided Injection of left  greater trochanteric bursitis secondary to patient's body habitus Device: GE Logiq Q7  Ultrasound guided injection is preferred based studies  that show increased duration, increased effect, greater accuracy, decreased procedural pain, increased response rate, and decreased cost with ultrasound guided versus blind injection.  Verbal informed consent obtained.  Time-out conducted.  Noted no overlying erythema, induration, or other signs of local infection.  Skin prepped in a sterile fashion.  Local anesthesia: Topical Ethyl chloride.  With sterile technique and under real time ultrasound guidance:  Greater trochanteric area was visualized and patient's bursa was noted. A 22-gauge 3 inch needle was inserted and 4 cc of 0.5% Marcaine and 1 cc of Kenalog 40 mg/dL was injected. Pictures taken Completed without difficulty  Pain immediately resolved suggesting accurate placement of the medication.  Advised to call if fevers/chills, erythema, induration, drainage, or persistent bleeding.  Images permanently stored and available for review in the ultrasound unit.  Impression: Technically successful ultrasound guided injection.   Impression and Recommendations:     This case  required medical decision making of moderate complexity.      Note: This dictation was prepared with Dragon dictation along with smaller phrase technology. Any transcriptional errors that result from this process are unintentional.

## 2017-01-05 DIAGNOSIS — N3941 Urge incontinence: Secondary | ICD-10-CM | POA: Diagnosis not present

## 2017-01-05 DIAGNOSIS — R350 Frequency of micturition: Secondary | ICD-10-CM | POA: Diagnosis not present

## 2017-01-14 DIAGNOSIS — R350 Frequency of micturition: Secondary | ICD-10-CM | POA: Diagnosis not present

## 2017-01-14 DIAGNOSIS — N3941 Urge incontinence: Secondary | ICD-10-CM | POA: Diagnosis not present

## 2017-01-15 ENCOUNTER — Ambulatory Visit (INDEPENDENT_AMBULATORY_CARE_PROVIDER_SITE_OTHER): Payer: PPO | Admitting: Internal Medicine

## 2017-01-15 ENCOUNTER — Ambulatory Visit (INDEPENDENT_AMBULATORY_CARE_PROVIDER_SITE_OTHER)
Admission: RE | Admit: 2017-01-15 | Discharge: 2017-01-15 | Disposition: A | Discharge status: Home / Self Care | Payer: PPO | Source: Ambulatory Visit | Attending: Internal Medicine | Admitting: Internal Medicine

## 2017-01-15 ENCOUNTER — Telehealth: Payer: Self-pay | Admitting: Internal Medicine

## 2017-01-15 ENCOUNTER — Encounter: Payer: Self-pay | Admitting: Internal Medicine

## 2017-01-15 VITALS — BP 138/76 | HR 65 | Ht 71.0 in | Wt 195.4 lb

## 2017-01-15 DIAGNOSIS — J90 Pleural effusion, not elsewhere classified: Secondary | ICD-10-CM | POA: Diagnosis not present

## 2017-01-15 NOTE — Patient Instructions (Addendum)
Please see patient coordinator before you leave today  to schedule thoracic surgery evaluation   Pulmonary follow up is as needed

## 2017-01-15 NOTE — Telephone Encounter (Signed)
Spoke with pt's wife, [name]. States that TP instructed them to call the morning of his next appointment to have someone put in an order for repeat CXR. Order has been placed per TP's last OV note. Nothing further was needed.

## 2017-01-15 NOTE — Progress Notes (Signed)
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 AAA > u/s done 10/21/16 c/w Large Left effusion >  CT chest 10/23/16  > L effusion and referred to pulmonary clinic 10/27/2016 by Dr Quay Burow    History of Present Illness  10/27/2016 1st [facility] office visit/ Dean Pruitt   Chief Complaint  Patient presents with  . PULMONARY CONSULT    Referred by Dr Quay Burow for pulm nodule/pleural effusion. Recent CT Chest w/contrast 10/23/16.    last cxr 12/31/15 and showed elevation of L HD but no def effusion with no comparison studies - at that point had complained of ST, HA, gen weakness and malaise with green sputum rx  > rx by Dr Jenny Reichmann with zpak No symptoms whatsoever since that  cxr dated 12/31/15  then and no symptoms since.  Specifically no cp/cough/ sob at any time. Most concerned about new foot drop on L since fell but no back pain rec  Left thoracentesis  done 11/03/2016 = 1.6 liter exudate, wbc 1074 L > P, glucose nl,  Cyt> atypical mesothelial cells   - Repeat L Tcentesis 12/08/16 =  1.8 liter> THE ATYPICAL CELLS ARE FAVORED TO BE REACTIVE MESOTHELIAL CELLS.   01/15/2017  f/u ov/Nandi Tonnesen re: L effusion with atypical cells  Chief Complaint  Patient presents with  . Follow-up    f/u plueral effusion, pt reposrts he is doing ok, he doesnt have a problem with SOB, lying flat or exertion    Minimal sensation of tightness with deep breath  No obvious day to day or daytime variability or assoc chronic cough or cp or  subjective wheeze or overt sinus or hb symptoms. No unusual exp hx or h/o childhood pna/ asthma or knowledge of premature birth.  Sleeping ok without nocturnal  or early am exacerbation  of respiratory  c/o's or  need for noct saba. Also denies any obvious fluctuation of symptoms with weather or environmental changes or other aggravating or alleviating factors except as outlined above   Current Medications, Allergies, Complete Past Medical History, Past Surgical History, Family History, and Social History were reviewed in [employer] record.  ROS  The following are not active complaints unless bolded sore throat, dysphagia, dental problems, itching, sneezing,  nasal congestion or excess/ purulent secretions, ear ache,   fever, chills, sweats, unintended wt loss, classically pleuritic or exertional cp, hemoptysis,  orthopnea pnd or leg swelling, presyncope, palpitations, abdominal pain, anorexia, nausea, vomiting, diarrhea  or change in bowel or bladder habits, change in stools or urine, dysuria,hematuria,  rash, arthralgias, visual complaints, headache, numbness, weakness or ataxia or problems with walking or coordination,  change in mood/affect or memory.                 Objective:   Physical Exam    01/15/2017        195        11/20/2016    192  10/27/16 188 lb 6.4 oz (85.5 kg)  10/12/16 187 lb 6.4 oz (85 kg)  09/30/16 187 lb (84.8 kg)    Vital signs reviewed  - Note on arrival 02 sats  100% on RA     HEENT: nl dentition, turbinates, and oropharynx. Nl external ear canals without cough reflex   NECK :  without JVD/Nodes/TM/ nl carotid upstrokes bilaterally   LUNGS: no acc muscle use,  Nl contour chest with  Dullness and decreased bs at L base    CV:  RRR  no s3 or murmur or increase in P2, no edema   ABD:  soft and nontender with nl inspiratory excursion in the supine position. No bruits or organomegaly, bowel sounds nl  MS:  Nl gait/ ext warm without deformities, calf tenderness, cyanosis or clubbing No obvious joint restrictions   SKIN: warm and dry without lesions    NEURO:  alert, approp, nl sensorium with  Weakness L foot dorsiflexion, can't fully flex  against gravity     CXR PA and Lateral:   01/15/2017 :    I personally reviewed images and agree with radiology impression as follows:    1. Small to moderate subpulmonic left pleural effusion, increased . 2. Increased left basilar lung opacity, favor atelectasis .       Assessment & Plan:

## 2017-01-16 NOTE — Assessment & Plan Note (Addendum)
Present since cxr  12/31/15  See CT chest 10/23/16  - Thoracentesis 11/03/2016 =  1.6 liter exudate, wbc 1074 L > P, glucose nl,  Cyt> atypical mesothelial cells - Repeat Tcentesis 12/08/16 =  1.8 liter> THE ATYPICAL CELLS ARE FAVORED TO BE REACTIVE MESOTHELIAL CELLS. -  Reaccumulating slowly as of 01/15/2017 > referred to T surgy    I had an extended final summary discussion with the patient/wife  reviewing all relevant studies completed to date and  lasting 15 to 20 minutes of a 25 minute visit on the following issues:     All the  indications, usual  risks and alternatives  relative to the benefits with patient who agrees to proceed with T surgery consideration for VATS for tissue dx and pleurodesis as he appears to be a good surgical candidate an we don't have a dx here   see avs for instructions unique to this ov and personally written by me and reviewed with pt and wife

## 2017-01-19 DIAGNOSIS — N3941 Urge incontinence: Secondary | ICD-10-CM | POA: Diagnosis not present

## 2017-01-22 ENCOUNTER — Telehealth: Payer: Self-pay | Admitting: Internal Medicine

## 2017-01-22 NOTE — Telephone Encounter (Signed)
Spoke with pt's wife. They had a question about his CXR. He pulled his CXR up on MyChart. There was a mention of surgical clips in his neck. Pt has never any surgery done his neck.  MW - please advise. Thanks.

## 2017-01-25 NOTE — Telephone Encounter (Signed)
atc pt, line rang several times with no answer, no option for vm.  Wcb.

## 2017-01-25 NOTE — Telephone Encounter (Signed)
These are very tiny and since the xrays are of the chest (and not the neck) it's hard to shed further light on them but they have not changed in position from prior studies ? Could they be from thyroid surgery > but they are harmless and do not need further w/u

## 2017-01-26 DIAGNOSIS — Z86010 Personal history of colonic polyps: Secondary | ICD-10-CM | POA: Diagnosis not present

## 2017-01-26 DIAGNOSIS — R350 Frequency of micturition: Secondary | ICD-10-CM | POA: Diagnosis not present

## 2017-01-26 DIAGNOSIS — R197 Diarrhea, unspecified: Secondary | ICD-10-CM | POA: Diagnosis not present

## 2017-01-26 DIAGNOSIS — N3941 Urge incontinence: Secondary | ICD-10-CM | POA: Diagnosis not present

## 2017-01-26 NOTE — Telephone Encounter (Signed)
Called and spoke with pts wife and she is aware of MW recs.  Nothing further is needed.

## 2017-01-28 ENCOUNTER — Ambulatory Visit (INDEPENDENT_AMBULATORY_CARE_PROVIDER_SITE_OTHER): Payer: PPO | Admitting: Family Medicine

## 2017-01-28 ENCOUNTER — Ambulatory Visit (INDEPENDENT_AMBULATORY_CARE_PROVIDER_SITE_OTHER): Payer: PPO

## 2017-01-28 ENCOUNTER — Encounter: Payer: Self-pay | Admitting: Family Medicine

## 2017-01-28 DIAGNOSIS — M7062 Trochanteric bursitis, left hip: Secondary | ICD-10-CM

## 2017-01-28 DIAGNOSIS — E538 Deficiency of other specified B group vitamins: Secondary | ICD-10-CM

## 2017-01-28 DIAGNOSIS — J90 Pleural effusion, not elsewhere classified: Secondary | ICD-10-CM | POA: Diagnosis not present

## 2017-01-28 DIAGNOSIS — M5416 Radiculopathy, lumbar region: Secondary | ICD-10-CM | POA: Diagnosis not present

## 2017-01-28 MED ORDER — CYANOCOBALAMIN 1000 MCG/ML IJ SOLN
1000.0000 ug | Freq: Once | INTRAMUSCULAR | Status: AC
  Administered 2017-01-28: 1000 ug via INTRAMUSCULAR

## 2017-01-28 NOTE — Assessment & Plan Note (Signed)
Seems doing well after the injection. We did discuss again about the possibility of repeating every 10-12 weeks if needed. Didn't do believe that some of the pain is still secondary to lumbar radiculopathy and cyst formation. We discussed we can repeat injection if necessary. We discussed icing regimen. His lungs patient is welcome follow-up as needed.

## 2017-01-28 NOTE — Assessment & Plan Note (Signed)
Stable. Continue the gabapentin. Follow-up again with me as needed. Worsening symptoms we'll consider epidural again.

## 2017-01-28 NOTE — Assessment & Plan Note (Signed)
Being followed by another provider. Increasing pleural effusion. Patient has been referred to surgery.

## 2017-01-28 NOTE — Patient Instructions (Signed)
Good to see you  I am glad you are getting better.  Ice is your friend.  See me again in 2 months if the hip starts coming back.  I am here if you need me.

## 2017-01-28 NOTE — Progress Notes (Signed)
[name] [address] [place], [place] [address] Phone: [phone] Subjective:    I'm seeing this patient by the request  of:  [name], MD  CC: Foot pain left hip pain f/u   [id number]  Dean Pruitt is a 83 y.o. male coming in with complaint of foot pain or weakness. Patient was found to have some weakness of the lower extremity and there is concern that this is coming from the lumbar back. Patient was given prednisone and gabapentin. Patient did have x-rays of the lumbar spine done. X-rays were independently visualized by me. Patient was found to have mild to moderate degenerative disc disease and mild facet arthropathy at L4-S1. Patient also was found to have what appeared to be an abdominal aortic aneurysm measuring approximately 3.7 cm. Patient was sent for an abdominal ultrasound. Abdominal aneurysm seemed to be stable. Patient though did have a left pleural effusion. And I reviewed the chart patient had this pleural effusion for 10 months. Was sent for a CT of the chest. CT chest with contrast was also reviewed by me. Patient was found to have a moderate left pleural effusion as well as what appeared to be a nodule approximately 10 mm within the effusion of the left lobe. Patient did have a paracentesis. Patient did have reaccumulation and did have a no apparent thoracentesis done on 12/08/2016. Patient did have repeat chest x-rays done 12/18/2016 that did not show any reaccumulation.   Patient states he has noticed some improvement in leg strength. Continued to have weakness of the foot. MRI of the lumbar spine was taken 11/09/2016. This was independently visualized by me. Patient's does have a large epidural cyst expansion of the sacral canal and remodeling of S2 and S3 from a tarlov cyst. This appears large enough that they could cause some nerve compression.  12/31/2016 update Patient at last exam was not making any significant  improvement and was sent for an epidural steroid injection. This was accomplished on 12/03/2016. patient states he did make some improvement. Patient did seem neurosurgery who stated that they do not went to do any type of intervention with the nurse pain wrapped around the cyst itself. Patient states that all the pain now seems to be localized mostly over the lateral hip. No significant pain radiating down the leg. States that the pain in the foot has completely resolved as well. Patient states that if she he did not have the pain on the lateral aspect the hip he would be doing significantly better.    Past Medical History:  Diagnosis Date  . Cataract   . Hyperlipidemia   . Thyroid cancer ([place])    PMH of; on supressive therapy   Past Surgical History:  Procedure Laterality Date  . CATARACT EXTRACTION, BILATERAL  2013 & 2014   Dr Celene Squibb  . colon polyps  2002 & 2005   negative 2008; Dr Earlean Shawl  . THYROIDECTOMY  2001   S/P RAI   Social History   Social History  . Marital status: Married    Spouse name: N/A  . Number of children: 2  . Years of education: N/A   Occupational History  . Retired    Social History Main Topics  . Smoking status: Former Smoker    Packs/day: 1.00    Types: Cigarettes    Quit date: 12/07/1958  . Smokeless tobacco: Never Used     Comment: smoked 1956-1960, up 1 ppd  . Alcohol  use 8.4 oz/week    14 Glasses of wine per week  . Drug use: No  . Sexual activity: Not Asked   Other Topics Concern  . None   Social History Narrative   Exercise:  Yard work, walks on occasion                  No Known Allergies Family History  Problem Relation Age of Onset  . Alzheimer's disease Mother   . Hypertension Mother   . Heart disease Father     CAD and angioplasty in 76s  . Cancer Father     Bladder  . Other Brother     valvular heart disease  . Diabetes Neg Hx   . Stroke Neg Hx     Past medical history, social, surgical and family history all  reviewed in electronic medical record.  No pertanent information unless stated regarding to the chief complaint.   Review of Systems: No headache, visual changes, nausea, vomiting, diarrhea, constipation, dizziness, abdominal pain, skin rash, fevers, chills, night sweats, weight loss, swollen lymph nodes, body aches,chest pain, shortness of breath, mood changes.  Mild positive muscle aches   Objective  Blood pressure 124/84, pulse 61, height '5\' 11"'$  (1.803 m), weight 195 lb (88.5 kg), SpO2 94 %.   Systems examined below as of 01/28/17 General: NAD A&O x3 mood, affect normal  HEENT: Pupils equal, extraocular movements intact no nystagmus Respiratory: not short of breath at rest or with speaking Cardiovascular: No lower extremity edema, non tender Skin: Warm dry intact with no signs of infection or rash on extremities or on axial skeleton. Abdomen: Soft nontender, no masses Neuro: Cranial nerves  intact, neurovascularly intact in all extremities with 2+ DTRs and 2+ pulses. Lymph: No lymphadenopathy appreciated today    Gait normal gait MSK:  Non tender with full range of motion and good stability and symmetric strength and tone of shoulders, elbows, wrist, , knee bilaterally. Mild arthritic changes of multiple joints Hip: Left ROM IR: 25 Deg, ER: 45 Deg, Flexion: 120 Deg, Extension: 100 Deg, Abduction: 45 Deg, Adduction: 25 Deg Strength IR: 5/5, ER: 5/5, Flexion: 5/5, Extension: 5/5, Abduction: 5/5, Adduction: 5/5 Pelvic alignment unremarkable to inspection and palpation. Standing hip rotation and gait without trendelenburg sign / unsteadiness. Moderate tenderness to palpation over the greater trochanter area. Positive Faber left No SI joint tenderness and normal minimal SI movement.   Procedure: Real-time Ultrasound Guided Injection of left  greater trochanteric bursitis secondary to patient's body habitus Device: GE Logiq Q7  Ultrasound guided injection is preferred based studies  that show increased duration, increased effect, greater accuracy, decreased procedural pain, increased response rate, and decreased cost with ultrasound guided versus blind injection.  Verbal informed consent obtained.  Time-out conducted.  Noted no overlying erythema, induration, or other signs of local infection.  Skin prepped in a sterile fashion.  Local anesthesia: Topical Ethyl chloride.  With sterile technique and under real time ultrasound guidance:  Greater trochanteric area was visualized and patient's bursa was noted. A 22-gauge 3 inch needle was inserted and 4 cc of 0.5% Marcaine and 1 cc of Kenalog 40 mg/dL was injected. Pictures taken Completed without difficulty  Pain immediately resolved suggesting accurate placement of the medication.  Advised to call if fevers/chills, erythema, induration, drainage, or persistent bleeding.  Images permanently stored and available for review in the ultrasound unit.  Impression: Technically successful ultrasound guided injection.   Impression and Recommendations:     This case  required medical decision making of moderate complexity.      Note: This dictation was prepared with Dragon dictation along with smaller phrase technology. Any transcriptional errors that result from this process are unintentional.         [address], [place] [address] Phone: [phone] Subjective:    I'm seeing this patient by the request  of:  [name], MD  CC: Foot pain left hip pain f/u   [id number]  Dean Pruitt is a 83 y.o. male coming in with complaint of foot pain or weakness. Patient was found to have some weakness of the lower extremity and there is concern that this is coming from the lumbar back. Patient was given prednisone and gabapentin. Patient did have x-rays of the lumbar spine done. X-rays were independently visualized by me. Patient was found to have mild to moderate  degenerative disc disease and mild facet arthropathy at L4-S1. Patient also was found to have what appeared to be an abdominal aortic aneurysm measuring approximately 3.7 cm. Patient was sent for an abdominal ultrasound. Abdominal aneurysm seemed to be stable. Patient though did have a left pleural effusion. And I reviewed the chart patient had this pleural effusion for 10 months. Was sent for a CT of the chest. CT chest with contrast was also reviewed by me. Patient was found to have a moderate left pleural effusion as well as what appeared to be a nodule approximately 10 mm within the effusion of the left lobe. Patient did have a paracentesis. Patient did have reaccumulation and did have a no apparent thoracentesis done on 12/08/2016. Patient did have repeat chest x-rays done 12/18/2016 that did not show any reaccumulation.   Patient states he has noticed some improvement in leg strength. Continued to have weakness of the foot. MRI of the lumbar spine was taken 11/09/2016. This was independently visualized by me. Patient's does have a large epidural cyst expansion of the sacral canal and remodeling of S2 and S3 from a tarlov cyst. This appears large enough that they could cause some nerve compression.  12/31/2016 update Patient at last exam was not making any significant improvement and was sent for an epidural steroid injection. This was accomplished on 12/03/2016. patient states he did make some improvement. Patient did seem neurosurgery who stated that they do not went to do any type of intervention with the nurse pain wrapped around the cyst itself. Patient states that all the pain now seems to be localized mostly over the lateral hip. No significant pain radiating down the leg. States that the pain in the foot has completely resolved as well. Patient states that if she he did not have the pain on the lateral aspect the hip he would be doing significantly better.    Past Medical History:  Diagnosis  Date  . Cataract   . Hyperlipidemia   . Thyroid cancer ([place])    PMH of; on supressive therapy   Past Surgical History:  Procedure Laterality Date  . CATARACT EXTRACTION, BILATERAL  2013 & 2014   Dr Celene Squibb  . colon polyps  2002 & 2005   negative 2008; Dr Earlean Shawl  . THYROIDECTOMY  2001   S/P RAI   Social History   Social History  . Marital status: Married    Spouse name: N/A  . Number of children: 2  . Years of education: N/A   Occupational History  . Retired    Science writer  History Main Topics  . Smoking status: Former Smoker    Packs/day: 1.00    Types: Cigarettes    Quit date: 12/07/1958  . Smokeless tobacco: Never Used     Comment: smoked 1956-1960, up 1 ppd  . Alcohol use 8.4 oz/week    14 Glasses of wine per week  . Drug use: No  . Sexual activity: Not Asked   Other Topics Concern  . None   Social History Narrative   Exercise:  Yard work, walks on occasion                  No Known Allergies Family History  Problem Relation Age of Onset  . Alzheimer's disease Mother   . Hypertension Mother   . Heart disease Father     CAD and angioplasty in 71s  . Cancer Father     Bladder  . Other Brother     valvular heart disease  . Diabetes Neg Hx   . Stroke Neg Hx     Past medical history, social, surgical and family history all reviewed in electronic medical record.  No pertanent information unless stated regarding to the chief complaint.   Review of Systems: No headache, visual changes, nausea, vomiting, diarrhea, constipation, dizziness, abdominal pain, skin rash, fevers, chills, night sweats, weight loss, swollen lymph nodes, body aches,chest pain, shortness of breath, mood changes.  Mild positive muscle aches   Objective  Blood pressure 124/84, pulse 61, height '5\' 11"'$  (1.803 m), weight 195 lb (88.5 kg), SpO2 94 %.   Systems examined below as of 01/28/17 General: NAD A&O x3 mood, affect normal  HEENT: Pupils equal, extraocular movements intact no  nystagmus Respiratory: not short of breath at rest or with speaking Cardiovascular: No lower extremity edema, non tender Skin: Warm dry intact with no signs of infection or rash on extremities or on axial skeleton. Abdomen: Soft nontender, no masses Neuro: Cranial nerves  intact, neurovascularly intact in all extremities with 2+ DTRs and 2+ pulses. Lymph: No lymphadenopathy appreciated today    Gait normal gait MSK:  Non tender with full range of motion and good stability and symmetric strength and tone of shoulders, elbows, wrist, , knee bilaterally. Mild arthritic changes of multiple joints Hip: Left ROM IR: 25 Deg, ER: 45 Deg, Flexion: 120 Deg, Extension: 100 Deg, Abduction: 45 Deg, Adduction: 25 Deg Strength IR: 5/5, ER: 5/5, Flexion: 5/5, Extension: 5/5, Abduction: 5/5, Adduction: 5/5 Pelvic alignment unremarkable to inspection and palpation. Standing hip rotation and gait without trendelenburg sign / unsteadiness. Moderate tenderness to palpation over the greater trochanter area. Positive Faber left No SI joint tenderness and normal minimal SI movement.   Procedure: Real-time Ultrasound Guided Injection of left  greater trochanteric bursitis secondary to patient's body habitus Device: GE Logiq Q7  Ultrasound guided injection is preferred based studies that show increased duration, increased effect, greater accuracy, decreased procedural pain, increased response rate, and decreased cost with ultrasound guided versus blind injection.  Verbal informed consent obtained.  Time-out conducted.  Noted no overlying erythema, induration, or other signs of local infection.  Skin prepped in a sterile fashion.  Local anesthesia: Topical Ethyl chloride.  With sterile technique and under real time ultrasound guidance:  Greater trochanteric area was visualized and patient's bursa was noted. A 22-gauge 3 inch needle was inserted and 4 cc of 0.5% Marcaine and 1 cc of Kenalog 40 mg/dL was injected.  Pictures taken Completed without difficulty  Pain immediately resolved suggesting accurate placement of  the medication.  Advised to call if fevers/chills, erythema, induration, drainage, or persistent bleeding.  Images permanently stored and available for review in the ultrasound unit.  Impression: Technically successful ultrasound guided injection.   Impression and Recommendations:     This case required medical decision making of moderate complexity.      Note: This dictation was prepared with Dragon dictation along with smaller phrase technology. Any transcriptional errors that result from this process are unintentional.         [address] [place], [place] [address] Phone: [phone] Subjective:    I'm seeing this patient by the request  of:  [name], MD  CC: Foot pain left hip pain f/u   [id number]  Dean Pruitt is a 83 y.o. male coming in with complaint of foot pain or weakness. Patient was found to have some weakness of the lower extremity and there is concern that this is coming from the lumbar back. Patient was given prednisone and gabapentin. Patient did have x-rays of the lumbar spine done. X-rays were independently visualized by me. Patient was found to have mild to moderate degenerative disc disease and mild facet arthropathy at L4-S1. Patient also was found to have what appeared to be an abdominal aortic aneurysm measuring approximately 3.7 cm. Patient was sent for an abdominal ultrasound. Abdominal aneurysm seemed to be stable. Patient though did have a left pleural effusion. And I reviewed the chart patient had this pleural effusion for 10 months. Was sent for a CT of the chest. CT chest with contrast was also reviewed by me. Patient was found to have a moderate left pleural effusion as well as what appeared to be a nodule approximately 10 mm within the effusion of the left lobe. Patient did have a  paracentesis. Patient did have reaccumulation and did have a no apparent thoracentesis done on 12/08/2016. Patient did have repeat chest x-rays done 12/18/2016 that did not show any reaccumulation.   Patient states he has noticed some improvement in leg strength. Continued to have weakness of the foot. MRI of the lumbar spine was taken 11/09/2016. This was independently visualized by me. Patient's does have a large epidural cyst expansion of the sacral canal and remodeling of S2 and S3 from a tarlov cyst. This appears large enough that they could cause some nerve compression.  12/31/2016 update Patient at last exam was not making any significant improvement and was sent for an epidural steroid injection. This was accomplished on 12/03/2016. patient states he did make some improvement. Patient did seem neurosurgery who stated that they do not went to do any type of intervention with the nurse pain wrapped around the cyst itself. Patient states that all the pain now seems to be localized mostly over the lateral hip. No significant pain radiating down the leg. States that the pain in the foot has completely resolved as well. Patient states that if she he did not have the pain on the lateral aspect the hip he would be doing significantly better.    Past Medical History:  Diagnosis Date  . Cataract   . Hyperlipidemia   . Thyroid cancer ([place])    PMH of; on supressive therapy   Past Surgical History:  Procedure Laterality Date  . CATARACT EXTRACTION, BILATERAL  2013 & 2014   Dr Celene Squibb  . colon polyps  2002 & 2005   negative 2008; Dr Earlean Shawl  . THYROIDECTOMY  2001   S/P  RAI   Social History   Social History  . Marital status: Married    Spouse name: N/A  . Number of children: 2  . Years of education: N/A   Occupational History  . Retired    Social History Main Topics  . Smoking status: Former Smoker    Packs/day: 1.00    Types: Cigarettes    Quit date: 12/07/1958  . Smokeless tobacco:  Never Used     Comment: smoked 1956-1960, up 1 ppd  . Alcohol use 8.4 oz/week    14 Glasses of wine per week  . Drug use: No  . Sexual activity: Not Asked   Other Topics Concern  . None   Social History Narrative   Exercise:  Yard work, walks on occasion                  No Known Allergies Family History  Problem Relation Age of Onset  . Alzheimer's disease Mother   . Hypertension Mother   . Heart disease Father     CAD and angioplasty in 85s  . Cancer Father     Bladder  . Other Brother     valvular heart disease  . Diabetes Neg Hx   . Stroke Neg Hx     Past medical history, social, surgical and family history all reviewed in electronic medical record.  No pertanent information unless stated regarding to the chief complaint.   Review of Systems: No headache, visual changes, nausea, vomiting, diarrhea, constipation, dizziness, abdominal pain, skin rash, fevers, chills, night sweats, weight loss, swollen lymph nodes, body aches,chest pain, shortness of breath, mood changes.  Mild positive muscle aches   Objective  Blood pressure 124/84, pulse 61, height '5\' 11"'$  (1.803 m), weight 195 lb (88.5 kg), SpO2 94 %.   Systems examined below as of 01/28/17 General: NAD A&O x3 mood, affect normal  HEENT: Pupils equal, extraocular movements intact no nystagmus Respiratory: not short of breath at rest or with speaking Cardiovascular: No lower extremity edema, non tender Skin: Warm dry intact with no signs of infection or rash on extremities or on axial skeleton. Abdomen: Soft nontender, no masses Neuro: Cranial nerves  intact, neurovascularly intact in all extremities with 2+ DTRs and 2+ pulses. Lymph: No lymphadenopathy appreciated today    Gait normal gait MSK:  Non tender with full range of motion and good stability and symmetric strength and tone of shoulders, elbows, wrist, , knee bilaterally. Mild arthritic changes of multiple joints Hip: Left ROM IR: 25 Deg, ER: 45 Deg,  Flexion: 120 Deg, Extension: 100 Deg, Abduction: 45 Deg, Adduction: 25 Deg Strength IR: 5/5, ER: 5/5, Flexion: 5/5, Extension: 5/5, Abduction: 5/5, Adduction: 5/5 Pelvic alignment unremarkable to inspection and palpation. Standing hip rotation and gait without trendelenburg sign / unsteadiness. Moderate tenderness to palpation over the greater trochanter area. Positive Faber left No SI joint tenderness and normal minimal SI movement.   Procedure: Real-time Ultrasound Guided Injection of left  greater trochanteric bursitis secondary to patient's body habitus Device: GE Logiq Q7  Ultrasound guided injection is preferred based studies that show increased duration, increased effect, greater accuracy, decreased procedural pain, increased response rate, and decreased cost with ultrasound guided versus blind injection.  Verbal informed consent obtained.  Time-out conducted.  Noted no overlying erythema, induration, or other signs of local infection.  Skin prepped in a sterile fashion.  Local anesthesia: Topical Ethyl chloride.  With sterile technique and under real time ultrasound guidance:  Greater trochanteric  area was visualized and patient's bursa was noted. A 22-gauge 3 inch needle was inserted and 4 cc of 0.5% Marcaine and 1 cc of Kenalog 40 mg/dL was injected. Pictures taken Completed without difficulty  Pain immediately resolved suggesting accurate placement of the medication.  Advised to call if fevers/chills, erythema, induration, drainage, or persistent bleeding.  Images permanently stored and available for review in the ultrasound unit.  Impression: Technically successful ultrasound guided injection.   Impression and Recommendations:     This case required medical decision making of moderate complexity.      Note: This dictation was prepared with Dragon dictation along with smaller phrase technology. Any transcriptional errors that result from this process are unintentional.           [address] [place], [place] [address] Phone: [phone] Subjective:    I'm seeing this patient by the request  of:  [name], MD  CC: Foot pain left hip pain f/u   [id number]  Dean Pruitt is a 83 y.o. male coming in with complaint of foot pain or weakness. Patient was found to have some weakness of the lower extremity and there is concern that this is coming from the lumbar back. Patient was given prednisone and gabapentin. Patient did have x-rays of the lumbar spine done. X-rays were independently visualized by me. Patient was found to have mild to moderate degenerative disc disease and mild facet arthropathy at L4-S1. Patient also was found to have what appeared to be an abdominal aortic aneurysm measuring approximately 3.7 cm. Patient was sent for an abdominal ultrasound. Abdominal aneurysm seemed to be stable. Patient though did have a left pleural effusion. And I reviewed the chart patient had this pleural effusion for 10 months. Was sent for a CT of the chest. CT chest with contrast was also reviewed by me. Patient was found to have a moderate left pleural effusion as well as what appeared to be a nodule approximately 10 mm within the effusion of the left lobe. Patient did have a paracentesis. Patient did have reaccumulation and did have a no apparent thoracentesis done on 12/08/2016. Patient did have repeat chest x-rays done 12/18/2016 that did not show any reaccumulation.   Patient states he has noticed some improvement in leg strength. Continued to have weakness of the foot. MRI of the lumbar spine was taken 11/09/2016. This was independently visualized by me. Patient's does have a large epidural cyst expansion of the sacral canal and remodeling of S2 and S3 from a tarlov cyst. This appears large enough that they could cause some nerve compression.  12/31/2016 update Patient at last exam was not making any  significant improvement and was sent for an epidural steroid injection. This was accomplished on 12/03/2016. patient states he did make some improvement. Patient did seem neurosurgery who stated that they do not went to do any type of intervention with the nurse pain wrapped around the cyst itself. Patient states that all the pain now seems to be localized mostly over the lateral hip. No significant pain radiating down the leg. States that the pain in the foot has completely resolved as well. Patient states that if she he did not have the pain on the lateral aspect the hip he would be doing significantly better.  01/28/2017 update- Patient was found to have a greater trochanter bursitis in addition to the patient's pathology of the lumbar spine. Patient was given an injection on January  25. Tolerated the procedure well. States that he is doing another 75% better. Doing relatively well at this moment. Not planning on having any type of other intervention at this point with the back. Is having still some difficulty with mild shortness of breath and monitoring his pleural effusion with another provider.    Past Medical History:  Diagnosis Date  . Cataract   . Hyperlipidemia   . Thyroid cancer ([place])    PMH of; on supressive therapy   Past Surgical History:  Procedure Laterality Date  . CATARACT EXTRACTION, BILATERAL  2013 & 2014   Dr Celene Squibb  . colon polyps  2002 & 2005   negative 2008; Dr Earlean Shawl  . THYROIDECTOMY  2001   S/P RAI   Social History   Social History  . Marital status: Married    Spouse name: N/A  . Number of children: 2  . Years of education: N/A   Occupational History  . Retired    Social History Main Topics  . Smoking status: Former Smoker    Packs/day: 1.00    Types: Cigarettes    Quit date: 12/07/1958  . Smokeless tobacco: Never Used     Comment: smoked 1956-1960, up 1 ppd  . Alcohol use 8.4 oz/week    14 Glasses of wine per week  . Drug use: No  . Sexual  activity: Not Asked   Other Topics Concern  . None   Social History Narrative   Exercise:  Yard work, walks on occasion                  No Known Allergies Family History  Problem Relation Age of Onset  . Alzheimer's disease Mother   . Hypertension Mother   . Heart disease Father     CAD and angioplasty in 3s  . Cancer Father     Bladder  . Other Brother     valvular heart disease  . Diabetes Neg Hx   . Stroke Neg Hx     Past medical history, social, surgical and family history all reviewed in electronic medical record.  No pertanent information unless stated regarding to the chief complaint.   Review of Systems: No headache, visual changes, nausea, vomiting, diarrhea, constipation, dizziness, abdominal pain, skin rash, fevers, chills, night sweats, weight loss, swollen lymph nodes, body aches,chest pain, shortness of breath, mood changes.  Mild positive muscle aches   Objective  Blood pressure 124/84, pulse 61, height '5\' 11"'$  (1.803 m), weight 195 lb (88.5 kg), SpO2 94 %.   Systems examined below as of 01/28/17 General: NAD A&O x3 mood, affect normal  HEENT: Pupils equal, extraocular movements intact no nystagmus Respiratory: not short of breath at rest or with speaking, no significant shortness of breath noted  Cardiovascular: No lower extremity edema, non tender Skin: Warm dry intact with no signs of infection or rash on extremities or on axial skeleton. Abdomen: Soft nontender, no masses Neuro: Cranial nerves  intact, neurovascularly intact in all extremities with 2+ DTRs and 2+ pulses. Lymph: No lymphadenopathy appreciated today  Gait normal coordination and balance  MSK:  Non tender with full range of motion and good stability and symmetric strength and tone of shoulders, elbows, wrist, , knee bilaterally. Mild arthritic changes of multiple joints Hip: Left ROM IR: 25 Deg, ER: 45 Deg, Flexion: 120 Deg, Extension: 100 Deg, Abduction: 45 Deg, Adduction: 25 Deg  stable from previous exam Strength IR: 5/5, ER: 5/5, Flexion: 5/5, Extension: 5/5, Abduction: 5/5, Adduction:  5/5 stable from previous exam  Pelvic alignment unremarkable to inspection and palpation. Standing hip rotation and gait without trendelenburg sign / unsteadiness.minimal tenderness over the greater trochanteric area still noted.aber left No SI joint tenderness and normal minimal SI movement.   Impression and Recommendations:     This case required medical decision making of moderate complexity.      Note: This dictation was prepared with Dragon dictation along with smaller phrase technology. Any transcriptional errors that result from this process are unintentional.

## 2017-02-01 ENCOUNTER — Ambulatory Visit (HOSPITAL_COMMUNITY)
Admission: RE | Admit: 2017-02-01 | Discharge: 2017-02-01 | Disposition: A | Discharge status: Home / Self Care | Payer: PPO | Source: Ambulatory Visit | Attending: General Surgery | Admitting: General Surgery

## 2017-02-01 ENCOUNTER — Telehealth: Payer: Self-pay | Admitting: Internal Medicine

## 2017-02-01 ENCOUNTER — Ambulatory Visit (HOSPITAL_COMMUNITY)
Admission: RE | Admit: 2017-02-01 | Discharge: 2017-02-01 | Disposition: A | Discharge status: Home / Self Care | Payer: PPO | Source: Ambulatory Visit | Attending: Internal Medicine | Admitting: Internal Medicine

## 2017-02-01 DIAGNOSIS — Z9889 Other specified postprocedural states: Secondary | ICD-10-CM

## 2017-02-01 DIAGNOSIS — R846 Abnormal cytological findings in specimens from respiratory organs and thorax: Secondary | ICD-10-CM | POA: Diagnosis not present

## 2017-02-01 DIAGNOSIS — J90 Pleural effusion, not elsewhere classified: Secondary | ICD-10-CM | POA: Diagnosis not present

## 2017-02-01 MED ORDER — LIDOCAINE HCL (PF) 1 % IJ SOLN
INTRAMUSCULAR | Status: AC
  Filled 2017-02-01: qty 10

## 2017-02-01 NOTE — Telephone Encounter (Signed)
Spoke with pt's wife. She is aware of MW's recommendation. Order has been placed for thoracentesis. Nothing further was needed.

## 2017-02-01 NOTE — Procedures (Signed)
Ultrasound-guided diagnostic and therapeutic left thoracentesis performed yielding 1.7 liters of yellow colored fluid. No immediate complications. Follow-up chest x-ray pending.       Dean Pruitt E 2:52 PM 02/01/2017

## 2017-02-01 NOTE — Telephone Encounter (Signed)
Pt wife wife called in concerned because pt is complaining of now feeling pain when he takes a deep breath, denies increase sob,wheezing, he does has some chest tightness. Wife is concerned about fluid build up. She states he is suppose to have a colonoscopy where he is suppose to be put to sleep and now that he is is experiencing this pain doesn't know if it is safe. She was requesting a ov today but you do not have any open slots nor an NP.  Dr. Melvyn Novas Please Advise of what should they do

## 2017-02-01 NOTE — Telephone Encounter (Signed)
Would rec IR tap on Left and send fluid only for cytology and keep appt to see DR Roxan Hockey 02/16/17

## 2017-02-02 DIAGNOSIS — R350 Frequency of micturition: Secondary | ICD-10-CM | POA: Diagnosis not present

## 2017-02-02 DIAGNOSIS — N3941 Urge incontinence: Secondary | ICD-10-CM | POA: Diagnosis not present

## 2017-02-04 NOTE — Progress Notes (Signed)
Spoke with pt and notified of results per Dr. Wert. Pt verbalized understanding and denied any questions. 

## 2017-02-08 ENCOUNTER — Telehealth: Payer: Self-pay | Admitting: Internal Medicine

## 2017-02-08 NOTE — Telephone Encounter (Signed)
1) try to 1st see if Dr Roxan Hockey can see him this week   2)  If can't be seen sooner  A) use melicam with meals prn pain B) if this doesn't help the pain:  Ov with all meds in hand this week since won't  Be seen until 02/16/17

## 2017-02-08 NOTE — Telephone Encounter (Signed)
Called [facility] and spoke with [place]. She is going to speak to Dr. Leonarda Salon nurse to see if they can move up the pt's appointment. Will await their call back.  Spoke with pt. He is aware of this information. Pt is aware that if we can't move up the appointment there is something else we can try.

## 2017-02-08 NOTE — Telephone Encounter (Signed)
Spoke with [employer] at [employer]. Pt's appointment has been moved up to 02/10/17 at [place] with pt's wife, [name]. She is aware of this information. Nothing further was needed.

## 2017-02-08 NOTE — Telephone Encounter (Signed)
Spoke with pt's wife, [name]. States that pt is still having issues with pain when breathing deep. The pain is localized to his left side. Pt had a thoracentesis on 02/01/17. He states that he can't tell a difference since this was done. Reports feeling like there is "air trapped" in his chest. Denies SOB, chest tightness, wheezing or fever. He has an upcoming appointment with Dr. Roxan Hockey on 02/16/17. They would like MW's recommendations in the meantime.  MW - please advise. Thanks.

## 2017-02-09 DIAGNOSIS — R350 Frequency of micturition: Secondary | ICD-10-CM | POA: Diagnosis not present

## 2017-02-09 DIAGNOSIS — N3941 Urge incontinence: Secondary | ICD-10-CM | POA: Diagnosis not present

## 2017-02-10 ENCOUNTER — Other Ambulatory Visit: Payer: Self-pay | Admitting: *Deleted

## 2017-02-10 ENCOUNTER — Encounter: Payer: Self-pay | Admitting: Thoracic Surgery (Cardiothoracic Vascular Surgery)

## 2017-02-10 ENCOUNTER — Other Ambulatory Visit: Payer: Self-pay

## 2017-02-10 ENCOUNTER — Other Ambulatory Visit: Payer: Self-pay | Admitting: Thoracic Surgery (Cardiothoracic Vascular Surgery)

## 2017-02-10 ENCOUNTER — Institutional Professional Consult (permissible substitution) (INDEPENDENT_AMBULATORY_CARE_PROVIDER_SITE_OTHER): Payer: PPO | Admitting: Thoracic Surgery (Cardiothoracic Vascular Surgery)

## 2017-02-10 ENCOUNTER — Ambulatory Visit
Admission: RE | Admit: 2017-02-10 | Discharge: 2017-02-10 | Disposition: A | Discharge status: Home / Self Care | Payer: PPO | Source: Ambulatory Visit | Attending: Thoracic Surgery (Cardiothoracic Vascular Surgery) | Admitting: Thoracic Surgery (Cardiothoracic Vascular Surgery)

## 2017-02-10 VITALS — BP 114/79 | HR 72 | Resp 20 | Ht 71.0 in | Wt 195.0 lb

## 2017-02-10 DIAGNOSIS — J90 Pleural effusion, not elsewhere classified: Secondary | ICD-10-CM

## 2017-02-10 NOTE — Progress Notes (Signed)
PCP is [name], MD Referring Provider is Tanda Rockers, MD  Chief Complaint  Patient presents with  . Pleural Effusion    Surgical eval for PleurX placement, CXR today    HPI: Mr. Buchler is an 83 year old gentleman sent for consultation regarding a recurring left pleural effusion.  Mr. Voorheis is an 83 year old man with a past medical history significant for thyroid cancer in 2001 treated with thyroidectomy and radioactive iodine. His history also includes arthritis, cataracts, and hyperlipidemia. He has been found to have a 4.2 cm ascending aneurysm during this workup.   He was in his usual state of health until around Thanksgiving that he developed a sensation of tightness in his chest when he would take a deep breath. Chest x-ray showed a left pleural effusion. CT confirmed a pleural effusion and showed a question of a lung nodule as well. There did appear to also be an infiltrate in the left lung. He had a thoracentesis done on 11/03/2016 which drained 1.5 L of serous fluid. Cytologies were negative. Analysis of the fluid was consistent with an exudate with LDH of 97 and white count of 1074. He improved symptomatically after that but his symptoms recurred around the first the year and he had another thoracentesis on 12/08/2016 where 1.8 L of fluid was drained. Cytology showed some atypical cells but these were felt to likely be reactive mesothelial cells. Again he had symptomatically relieved but over about a month's time his symptoms recurred and he recently had a third thoracentesis. Cytology was again negative. He did not experience as much relief with the third thoracentesis as he did with the first 2.  He currently has a mild sensation of tightness in his chest with deep breath but otherwise no shortness of breath. He denies any substernal chest pressure, pain or tightness. His appetite is good he has not had any significant weight loss. He's not had any recent chills. He smoked  about a pack of cigarettes daily while he was in the [employer]. He quit in 1960.  Zubrod Score: At the time of surgery this patient's most appropriate activity status/level should be described as: '[x]'$     0    Normal activity, no symptoms '[]'$     1    Restricted in physical strenuous activity but ambulatory, able to do out light work '[]'$     2    Ambulatory and capable of self care, unable to do work activities, up and about >50 % of waking hours                              '[]'$     3    Only limited self care, in bed greater than 50% of waking hours '[]'$     4    Completely disabled, no self care, confined to bed or chair '[]'$     5    Moribund   Past Medical History:  Diagnosis Date  . Cataract   . Hyperlipidemia   . Thyroid cancer ([place])    PMH of; on supressive therapy    Past Surgical History:  Procedure Laterality Date  . CATARACT EXTRACTION, BILATERAL  2013 & 2014   Dr Celene Squibb  . colon polyps  2002 & 2005   negative 2008; Dr Earlean Shawl  . THYROIDECTOMY  2001   S/P RAI    Family History  Problem Relation Age of Onset  . Alzheimer's disease Mother   .  Hypertension Mother   . Heart disease Father     CAD and angioplasty in 31s  . Cancer Father     Bladder  . Other Brother     valvular heart disease  . Diabetes Neg Hx   . Stroke Neg Hx     Social History Social History  Substance Use Topics  . Smoking status: Former Smoker    Packs/day: 1.00    Types: Cigarettes    Quit date: 12/07/1958  . Smokeless tobacco: Never Used     Comment: smoked 1956-1960, up 1 ppd  . Alcohol use 8.4 oz/week    14 Glasses of wine per week    Current Outpatient Prescriptions  Medication Sig Dispense Refill  . aspirin 81 MG tablet Take 81 mg by mouth daily.    Marland Kitchen escitalopram (LEXAPRO) 10 MG tablet Take 1 tablet (10 mg total) by mouth at bedtime. 90 tablet 1  . gabapentin (NEURONTIN) 300 MG capsule nightly 30 capsule 3  . levothyroxine (SYNTHROID, LEVOTHROID) 150 MCG tablet TAKE 1 TABLET (150  mcg) BY MOUTH 3 days a week, 75 mcg 4 days a week 45 tablet 1  . rosuvastatin (CRESTOR) 20 MG tablet Take 1 tablet (20 mg total) by mouth daily. (Patient taking differently: Take 20 mg by mouth every other day. ) 90 tablet 3   Current Facility-Administered Medications  Medication Dose Route Frequency Provider Last Rate Last Dose  . cyanocobalamin ((VITAMIN B-12)) injection 1,000 mcg  1,000 mcg Intramuscular Once Pieter Partridge, DO        No Known Allergies  Review of Systems  Constitutional: Positive for fatigue. Negative for unexpected weight change.  HENT: Negative for trouble swallowing and voice change.   Eyes: Negative for visual disturbance.  Respiratory: Positive for chest tightness (left side with deep breath).   Gastrointestinal: Positive for diarrhea. Negative for abdominal pain.  Genitourinary: Positive for difficulty urinating and frequency.  Musculoskeletal: Positive for arthralgias and joint swelling.  Neurological: Negative for seizures and syncope.       Memory loss  Hematological: Negative for adenopathy. Does not bruise/bleed easily.  Psychiatric/Behavioral: Positive for dysphoric mood.  All other systems reviewed and are negative.   BP 114/79   Pulse 72   Resp 20   Ht '5\' 11"'$  (1.803 m)   Wt 195 lb (88.5 kg)   SpO2 94%   BMI 27.20 kg/m  Physical Exam  Constitutional: He is oriented to person, place, and time. He appears well-developed and well-nourished. No distress.  HENT:  Head: Normocephalic and atraumatic.  Mouth/Throat: No oropharyngeal exudate.  Eyes: Conjunctivae and EOM are normal. No scleral icterus.  Neck: Neck supple. No thyromegaly present.  Cardiovascular: Normal rate, regular rhythm and normal heart sounds.   No murmur heard. Pulmonary/Chest: Effort normal. No respiratory distress. He has no wheezes. He has no rales.  Diminished breath sounds left base  Abdominal: Soft. He exhibits no distension. There is no tenderness.  Musculoskeletal: He  exhibits no edema.  Lymphadenopathy:    He has no cervical adenopathy.  Neurological: He is alert and oriented to person, place, and time. No cranial nerve deficit.  No focal motor deficit  Skin: Skin is warm and dry.  Vitals reviewed.    Diagnostic Tests: CT CHEST WITH CONTRAST  TECHNIQUE: Multidetector CT imaging of the chest was performed during intravenous contrast administration.  CONTRAST:  57m ISOVUE-300 IOPAMIDOL (ISOVUE-300) INJECTION 61%  COMPARISON:  12/31/2015, ultrasound the abdomen on 10/21/2016  FINDINGS: Cardiovascular: Heart size  is normal. There is extensive coronary artery calcification. Ascending aorta is 4.2 cm. No evidence for dissection. There is atherosclerotic calcifications of the thoracic aorta. Variant of normal arch anatomy. Left vertebral artery has a discrete origin.  Mediastinum/Nodes: Small mediastinal lymph nodes are identified. Precarinal node is 1.3 x 2.9 cm. Left hilar node is 1.6 x 0.6 cm.  Lungs/Pleura: Moderate left-sided pleural effusion associated with dependent atelectasis. Within the left upper lobe there is eighth pleural based nodule along the anterior medial pleural surface best seen on image 82 of series 2. This measures 9 x 11 mm. No other suspicious pleural based nodules are identified.  Upper Abdomen: Elevation of the right hemidiaphragm. Small hiatal hernia noted.  Musculoskeletal: Degenerative changes are seen in thoracic spine. Note is made of mild bilateral gynecomastia.  IMPRESSION: 1. Moderate left pleural effusion and left atelectasis. 2. **An incidental finding of potential clinical significance has been found. Left pleural based nodule measuring 10 mm. Consider one of the following in 3 months for both low-risk and high-risk individuals: (a) repeat chest CT, (b) follow-up PET-CT, or (c) tissue sampling. This recommendation follows the consensus statement: Guidelines for Management of Incidental  Pulmonary Nodules Detected on CT Images: From the Fleischner Society 2017; Radiology 2017; 284:228-243. ** 3. Coronary artery disease. 4. Elevation of the right hemidiaphragm. 5. Small hiatal hernia. 6. Gynecomastia.   Electronically Signed   By: Nolon Nations M.D.   On: 10/23/2016 11:38  CHEST  2 VIEW  COMPARISON:  02/01/2017  FINDINGS: There is small left pleural effusion with left basilar atelectasis. Right lung is clear. No pulmonary edema. Cardiomediastinal silhouette is stable. Mild degenerative changes thoracic spine are stable.  IMPRESSION: Small left pleural effusion with left basilar atelectasis. No pulmonary edema. Right lung is clear.   Electronically Signed   By: Lahoma Crocker M.D.   On: 02/10/2017 11:52 I personally reviewed the CT chest as well as the chest x-rays and concur with the findings noted above.  Impression: Mr. Sanden is an 83 year old man with a recurrent left pleural effusion that is exudative. He's had multiple negative cytology so I think malignancy is unlikely, but it has not been definitively ruled out. This also could be a post infectious or chronic inflammatory effusion. Regardless we need to try to establish an underlying cause for the effusion and try to prevent its recurrence.  I recommended that we proceed with left VATS for drainage of the effusion and pleural biopsies. We will also do a lung biopsy if there is reason to do so. We will then either place a pleural catheter or do talc pleurodesis (if malignant). I described the general nature of the operation to Mr. and Mrs. Daisey. We discussed the need for general anesthesia, the incisions to be used, the use of drainage tubes postoperatively, the expected hospital stay, and the overall recovery. I reviewed the indications, risks, benefits, and alternatives. They understand the risk include, but are not limited to death, MI, DVT, PE, stroke, bleeding, possible need for transfusion,  infection, air leak, irregular heart rhythms, as well as possibility of other unforeseeable, occasions.  He understands and accepts the risks and wishes to proceed.  I do think we should repeat a CT prior to surgery as it has been about 4 months since that was done. There was a questionable lung nodule and also an infiltrative process in the lung as well on the previous CT  Plan:  Left VATS, drainage of pleural effusion, pleural biopsies, possible lung  biopsy, possible pleural catheter placement, possible talc pleurodesis on Monday, 02/15/2017.  Melrose Nakayama, MD [facility] [phone]

## 2017-02-12 ENCOUNTER — Ambulatory Visit
Admission: RE | Admit: 2017-02-12 | Discharge: 2017-02-12 | Disposition: A | Discharge status: Home / Self Care | Payer: PPO | Source: Ambulatory Visit | Attending: Thoracic Surgery (Cardiothoracic Vascular Surgery) | Admitting: Thoracic Surgery (Cardiothoracic Vascular Surgery)

## 2017-02-12 ENCOUNTER — Ambulatory Visit (HOSPITAL_COMMUNITY)
Admission: RE | Admit: 2017-02-12 | Discharge: 2017-02-12 | Disposition: A | Discharge status: Home / Self Care | Payer: PPO | Source: Ambulatory Visit | Attending: Thoracic Surgery (Cardiothoracic Vascular Surgery) | Admitting: Thoracic Surgery (Cardiothoracic Vascular Surgery)

## 2017-02-12 ENCOUNTER — Encounter (HOSPITAL_COMMUNITY): Payer: Self-pay

## 2017-02-12 ENCOUNTER — Encounter (HOSPITAL_COMMUNITY)
Admission: RE | Admit: 2017-02-12 | Discharge: 2017-02-12 | Disposition: A | Discharge status: Home / Self Care | Payer: PPO | Source: Ambulatory Visit | Attending: Thoracic Surgery (Cardiothoracic Vascular Surgery) | Admitting: Thoracic Surgery (Cardiothoracic Vascular Surgery)

## 2017-02-12 DIAGNOSIS — Z85850 Personal history of malignant neoplasm of thyroid: Secondary | ICD-10-CM | POA: Diagnosis not present

## 2017-02-12 DIAGNOSIS — Z01812 Encounter for preprocedural laboratory examination: Secondary | ICD-10-CM | POA: Insufficient documentation

## 2017-02-12 DIAGNOSIS — J90 Pleural effusion, not elsewhere classified: Secondary | ICD-10-CM | POA: Insufficient documentation

## 2017-02-12 DIAGNOSIS — Z79899 Other long term (current) drug therapy: Secondary | ICD-10-CM | POA: Diagnosis not present

## 2017-02-12 DIAGNOSIS — Z8052 Family history of malignant neoplasm of bladder: Secondary | ICD-10-CM | POA: Diagnosis not present

## 2017-02-12 DIAGNOSIS — E890 Postprocedural hypothyroidism: Secondary | ICD-10-CM | POA: Diagnosis not present

## 2017-02-12 DIAGNOSIS — Z01818 Encounter for other preprocedural examination: Secondary | ICD-10-CM | POA: Insufficient documentation

## 2017-02-12 DIAGNOSIS — Z8249 Family history of ischemic heart disease and other diseases of the circulatory system: Secondary | ICD-10-CM | POA: Diagnosis not present

## 2017-02-12 DIAGNOSIS — Z0183 Encounter for blood typing: Secondary | ICD-10-CM

## 2017-02-12 DIAGNOSIS — I739 Peripheral vascular disease, unspecified: Secondary | ICD-10-CM | POA: Diagnosis not present

## 2017-02-12 DIAGNOSIS — Z820 Family history of epilepsy and other diseases of the nervous system: Secondary | ICD-10-CM | POA: Diagnosis not present

## 2017-02-12 DIAGNOSIS — K219 Gastro-esophageal reflux disease without esophagitis: Secondary | ICD-10-CM | POA: Diagnosis not present

## 2017-02-12 DIAGNOSIS — Z781 Physical restraint status: Secondary | ICD-10-CM | POA: Diagnosis not present

## 2017-02-12 DIAGNOSIS — R918 Other nonspecific abnormal finding of lung field: Secondary | ICD-10-CM

## 2017-02-12 DIAGNOSIS — Z9841 Cataract extraction status, right eye: Secondary | ICD-10-CM | POA: Diagnosis not present

## 2017-02-12 DIAGNOSIS — Z9842 Cataract extraction status, left eye: Secondary | ICD-10-CM | POA: Diagnosis not present

## 2017-02-12 DIAGNOSIS — E785 Hyperlipidemia, unspecified: Secondary | ICD-10-CM | POA: Diagnosis not present

## 2017-02-12 DIAGNOSIS — F05 Delirium due to known physiological condition: Secondary | ICD-10-CM | POA: Diagnosis not present

## 2017-02-12 DIAGNOSIS — R0789 Other chest pain: Secondary | ICD-10-CM | POA: Diagnosis not present

## 2017-02-12 DIAGNOSIS — I454 Nonspecific intraventricular block: Secondary | ICD-10-CM | POA: Diagnosis not present

## 2017-02-12 DIAGNOSIS — F10239 Alcohol dependence with withdrawal, unspecified: Secondary | ICD-10-CM | POA: Diagnosis not present

## 2017-02-12 DIAGNOSIS — C450 Mesothelioma of pleura: Secondary | ICD-10-CM | POA: Diagnosis not present

## 2017-02-12 DIAGNOSIS — R9431 Abnormal electrocardiogram [ECG] [EKG]: Secondary | ICD-10-CM | POA: Insufficient documentation

## 2017-02-12 DIAGNOSIS — Z87891 Personal history of nicotine dependence: Secondary | ICD-10-CM | POA: Diagnosis not present

## 2017-02-12 DIAGNOSIS — Z7982 Long term (current) use of aspirin: Secondary | ICD-10-CM | POA: Diagnosis not present

## 2017-02-12 DIAGNOSIS — R443 Hallucinations, unspecified: Secondary | ICD-10-CM | POA: Diagnosis not present

## 2017-02-12 DIAGNOSIS — N3289 Other specified disorders of bladder: Secondary | ICD-10-CM | POA: Diagnosis not present

## 2017-02-12 DIAGNOSIS — D72829 Elevated white blood cell count, unspecified: Secondary | ICD-10-CM | POA: Diagnosis not present

## 2017-02-12 HISTORY — DX: Depression, unspecified: F32.A

## 2017-02-12 HISTORY — DX: Gastro-esophageal reflux disease without esophagitis: K21.9

## 2017-02-12 HISTORY — DX: Hypothyroidism, unspecified: E03.9

## 2017-02-12 HISTORY — DX: Major depressive disorder, single episode, unspecified: F32.9

## 2017-02-12 LAB — COMPREHENSIVE METABOLIC PANEL
ALBUMIN: 3.7 g/dL (ref 3.5–5.0)
ALT: 22 U/L (ref 17–63)
ANION GAP: 7 (ref 5–15)
AST: 33 U/L (ref 15–41)
Alkaline Phosphatase: 64 U/L (ref 38–126)
BUN: 12 mg/dL (ref 6–20)
CHLORIDE: 104 mmol/L (ref 101–111)
CO2: 28 mmol/L (ref 22–32)
Calcium: 8.8 mg/dL — ABNORMAL LOW (ref 8.9–10.3)
Creatinine, Ser: 1.08 mg/dL (ref 0.61–1.24)
GFR calc Af Amer: >60 mL/min (ref >60)
GFR calc non Af Amer: >60 mL/min (ref >60)
GLUCOSE: 85 mg/dL (ref 65–99)
POTASSIUM: 4.1 mmol/L (ref 3.5–5.1)
Sodium: 139 mmol/L (ref 135–145)
Total Bilirubin: 1.2 mg/dL (ref 0.3–1.2)
Total Protein: 6.5 g/dL (ref 6.5–8.1)

## 2017-02-12 LAB — URINALYSIS, ROUTINE W REFLEX MICROSCOPIC
Bilirubin Urine: NEGATIVE
Glucose, UA: NEGATIVE mg/dL
Hgb urine dipstick: NEGATIVE
KETONES UR: NEGATIVE mg/dL
LEUKOCYTES UA: NEGATIVE
NITRITE: NEGATIVE
PH: 5 (ref 5.0–8.0)
Protein, ur: NEGATIVE mg/dL
SPECIFIC GRAVITY, URINE: 1.017 (ref 1.005–1.030)

## 2017-02-12 LAB — CBC
HCT: 49.2 % (ref 39.0–52.0)
Hemoglobin: 16.6 g/dL (ref 13.0–17.0)
MCH: 32.2 pg (ref 26.0–34.0)
MCHC: 33.7 g/dL (ref 30.0–36.0)
MCV: 95.3 fL (ref 78.0–100.0)
PLATELETS: 234 10*3/uL (ref 150–400)
RBC: 5.16 MIL/uL (ref 4.22–5.81)
RDW: 13.5 % (ref 11.5–15.5)
WBC: 9 10*3/uL (ref 4.0–10.5)

## 2017-02-12 LAB — BLOOD GAS, ARTERIAL
Acid-Base Excess: 0.3 mmol/L (ref 0.0–2.0)
Bicarbonate: 23.8 mmol/L (ref 20.0–28.0)
DRAWN BY: 470591
FIO2: 21
O2 SAT: 97.7 %
PATIENT TEMPERATURE: 98.6
PO2 ART: 183 mmHg — AB (ref 83.0–108.0)
pCO2 arterial: 33.9 mmHg (ref 32.0–48.0)
pH, Arterial: 7.459 — ABNORMAL HIGH (ref 7.350–7.450)

## 2017-02-12 LAB — PROTIME-INR
INR: 1
PROTHROMBIN TIME: 13.2 s (ref 11.4–15.2)

## 2017-02-12 LAB — TYPE AND SCREEN
ABO/RH(D): A NEG
ANTIBODY SCREEN: NEGATIVE

## 2017-02-12 LAB — ABO/RH: ABO/RH(D): A NEG

## 2017-02-12 LAB — SURGICAL PCR SCREEN
MRSA, PCR: NEGATIVE
STAPHYLOCOCCUS AUREUS: NEGATIVE

## 2017-02-12 LAB — APTT: APTT: 27 s (ref 24–36)

## 2017-02-12 NOTE — Pre-Procedure Instructions (Signed)
    [place]  02/12/2017    Your procedure is scheduled on Monday, March 12.  Report to [facility] Admitting at 9:10 AM                Your surgery or procedure is scheduled for 11:10 AM   Call this number if you have problems the morning of surgery: [phone]                Remember:  Do not eat food or drink liquids after midnight Sunday, March 11.  Take these medicines the morning of surgery with A SIP OF WATER : levothyroxine (SYNTHROID, LEVOTHROID).                 Hold Aspirin as instructed by Dr Roxan Hockey \  Do not wear jewelry, make-up or nail polish.  Do not wear lotions, powders, or perfumes, or deodorant.  Do not shave 48 hours prior to surgery.  Men may shave face and neck.  Do not bring valuables to the hospital.  [facility] is not responsible for any belongings or valuables.  Contacts, dentures or bridgework may not be worn into surgery.  Leave your suitcase in the car.  After surgery it may be brought to your room.  For patients admitted to the hospital, discharge time will be determined by your treatment team.  Special Instructions: Review  [facility] - Preparing For Surgery.Please read over the following fact sheets that you were given: [facility] Preparing For Surgery and Patient Instructions for Mupirocin Application, Pain Booklet

## 2017-02-15 ENCOUNTER — Encounter (HOSPITAL_COMMUNITY)
Admission: RE | Disposition: A | Discharge status: Home Health | Payer: Self-pay | Source: Ambulatory Visit | Attending: Thoracic Surgery (Cardiothoracic Vascular Surgery)

## 2017-02-15 ENCOUNTER — Inpatient Hospital Stay (HOSPITAL_COMMUNITY): Payer: PPO | Admitting: Certified Registered"

## 2017-02-15 ENCOUNTER — Inpatient Hospital Stay (HOSPITAL_COMMUNITY)
Admission: RE | Admit: 2017-02-15 | Discharge: 2017-02-22 | DRG: 164 | Disposition: A | Discharge status: Home Health | Payer: PPO | Source: Ambulatory Visit | Attending: Thoracic Surgery (Cardiothoracic Vascular Surgery) | Admitting: Thoracic Surgery (Cardiothoracic Vascular Surgery)

## 2017-02-15 ENCOUNTER — Encounter (HOSPITAL_COMMUNITY): Payer: Self-pay | Admitting: *Deleted

## 2017-02-15 ENCOUNTER — Inpatient Hospital Stay (HOSPITAL_COMMUNITY): Payer: PPO

## 2017-02-15 DIAGNOSIS — Z79899 Other long term (current) drug therapy: Secondary | ICD-10-CM | POA: Diagnosis not present

## 2017-02-15 DIAGNOSIS — J9811 Atelectasis: Secondary | ICD-10-CM | POA: Diagnosis not present

## 2017-02-15 DIAGNOSIS — K219 Gastro-esophageal reflux disease without esophagitis: Secondary | ICD-10-CM | POA: Diagnosis present

## 2017-02-15 DIAGNOSIS — J90 Pleural effusion, not elsewhere classified: Secondary | ICD-10-CM | POA: Diagnosis not present

## 2017-02-15 DIAGNOSIS — Z8052 Family history of malignant neoplasm of bladder: Secondary | ICD-10-CM | POA: Diagnosis not present

## 2017-02-15 DIAGNOSIS — Z9889 Other specified postprocedural states: Secondary | ICD-10-CM

## 2017-02-15 DIAGNOSIS — E890 Postprocedural hypothyroidism: Secondary | ICD-10-CM | POA: Diagnosis present

## 2017-02-15 DIAGNOSIS — R350 Frequency of micturition: Secondary | ICD-10-CM | POA: Diagnosis not present

## 2017-02-15 DIAGNOSIS — Z9842 Cataract extraction status, left eye: Secondary | ICD-10-CM | POA: Diagnosis not present

## 2017-02-15 DIAGNOSIS — E785 Hyperlipidemia, unspecified: Secondary | ICD-10-CM | POA: Diagnosis not present

## 2017-02-15 DIAGNOSIS — Z85850 Personal history of malignant neoplasm of thyroid: Secondary | ICD-10-CM | POA: Diagnosis not present

## 2017-02-15 DIAGNOSIS — J939 Pneumothorax, unspecified: Secondary | ICD-10-CM | POA: Diagnosis not present

## 2017-02-15 DIAGNOSIS — F10239 Alcohol dependence with withdrawal, unspecified: Secondary | ICD-10-CM | POA: Diagnosis present

## 2017-02-15 DIAGNOSIS — Z820 Family history of epilepsy and other diseases of the nervous system: Secondary | ICD-10-CM

## 2017-02-15 DIAGNOSIS — F05 Delirium due to known physiological condition: Secondary | ICD-10-CM | POA: Diagnosis not present

## 2017-02-15 DIAGNOSIS — Z4682 Encounter for fitting and adjustment of non-vascular catheter: Secondary | ICD-10-CM

## 2017-02-15 DIAGNOSIS — M5416 Radiculopathy, lumbar region: Secondary | ICD-10-CM | POA: Diagnosis not present

## 2017-02-15 DIAGNOSIS — J439 Emphysema, unspecified: Secondary | ICD-10-CM | POA: Diagnosis not present

## 2017-02-15 DIAGNOSIS — Z87891 Personal history of nicotine dependence: Secondary | ICD-10-CM | POA: Diagnosis not present

## 2017-02-15 DIAGNOSIS — I517 Cardiomegaly: Secondary | ICD-10-CM | POA: Diagnosis not present

## 2017-02-15 DIAGNOSIS — C459 Mesothelioma, unspecified: Secondary | ICD-10-CM

## 2017-02-15 DIAGNOSIS — D72829 Elevated white blood cell count, unspecified: Secondary | ICD-10-CM | POA: Diagnosis present

## 2017-02-15 DIAGNOSIS — C450 Mesothelioma of pleura: Secondary | ICD-10-CM

## 2017-02-15 DIAGNOSIS — I739 Peripheral vascular disease, unspecified: Secondary | ICD-10-CM | POA: Diagnosis present

## 2017-02-15 DIAGNOSIS — R443 Hallucinations, unspecified: Secondary | ICD-10-CM | POA: Diagnosis not present

## 2017-02-15 DIAGNOSIS — Z781 Physical restraint status: Secondary | ICD-10-CM

## 2017-02-15 DIAGNOSIS — I454 Nonspecific intraventricular block: Secondary | ICD-10-CM | POA: Diagnosis present

## 2017-02-15 DIAGNOSIS — Z8249 Family history of ischemic heart disease and other diseases of the circulatory system: Secondary | ICD-10-CM

## 2017-02-15 DIAGNOSIS — Z0389 Encounter for observation for other suspected diseases and conditions ruled out: Secondary | ICD-10-CM | POA: Diagnosis not present

## 2017-02-15 DIAGNOSIS — R0789 Other chest pain: Secondary | ICD-10-CM | POA: Diagnosis not present

## 2017-02-15 DIAGNOSIS — Z9841 Cataract extraction status, right eye: Secondary | ICD-10-CM | POA: Diagnosis not present

## 2017-02-15 DIAGNOSIS — N3289 Other specified disorders of bladder: Secondary | ICD-10-CM | POA: Diagnosis not present

## 2017-02-15 DIAGNOSIS — Z7982 Long term (current) use of aspirin: Secondary | ICD-10-CM

## 2017-02-15 DIAGNOSIS — C7802 Secondary malignant neoplasm of left lung: Secondary | ICD-10-CM | POA: Diagnosis not present

## 2017-02-15 DIAGNOSIS — C384 Malignant neoplasm of pleura: Secondary | ICD-10-CM | POA: Diagnosis not present

## 2017-02-15 HISTORY — PX: PLEURAL EFFUSION DRAINAGE: SHX5099

## 2017-02-15 HISTORY — PX: VIDEO ASSISTED THORACOSCOPY: SHX5073

## 2017-02-15 HISTORY — PX: LUNG BIOPSY: SHX5088

## 2017-02-15 LAB — GLUCOSE, CAPILLARY
Glucose-Capillary: 160 mg/dL — ABNORMAL HIGH (ref 65–99)
Glucose-Capillary: 170 mg/dL — ABNORMAL HIGH (ref 65–99)
Glucose-Capillary: 177 mg/dL — ABNORMAL HIGH (ref 65–99)

## 2017-02-15 LAB — GRAM STAIN

## 2017-02-15 SURGERY — VIDEO ASSISTED THORACOSCOPY
Anesthesia: General | Site: Chest | Laterality: Left

## 2017-02-15 MED ORDER — ROCURONIUM BROMIDE 50 MG/5ML IV SOSY
PREFILLED_SYRINGE | INTRAVENOUS | Status: AC
  Filled 2017-02-15: qty 5

## 2017-02-15 MED ORDER — OXYCODONE HCL 5 MG PO TABS
5.0000 mg | ORAL_TABLET | ORAL | Status: DC | PRN
  Administered 2017-02-15: 5 mg via ORAL
  Filled 2017-02-15: qty 1

## 2017-02-15 MED ORDER — LEVOTHYROXINE SODIUM 75 MCG PO TABS: 75.0000 ug | ORAL_TABLET | ORAL | Status: DC

## 2017-02-15 MED ORDER — DIPHENHYDRAMINE HCL 50 MG/ML IJ SOLN
12.5000 mg | Freq: Four times a day (QID) | INTRAMUSCULAR | Status: DC | PRN
Start: 2017-02-15 — End: 2017-02-16

## 2017-02-15 MED ORDER — ORAL CARE MOUTH RINSE
15.0000 mL | Freq: Two times a day (BID) | OROMUCOSAL | Status: DC
  Administered 2017-02-15: 15 mL via OROMUCOSAL

## 2017-02-15 MED ORDER — DEXTROSE 5 % IV SOLN
1.5000 g | Freq: Two times a day (BID) | INTRAVENOUS | Status: AC
  Administered 2017-02-15 – 2017-02-16 (×2): 1.5 g via INTRAVENOUS
  Filled 2017-02-15 (×2): qty 1.5

## 2017-02-15 MED ORDER — FENTANYL CITRATE (PF) 100 MCG/2ML IJ SOLN
INTRAMUSCULAR | Status: DC | PRN
  Administered 2017-02-15: 50 ug via INTRAVENOUS
  Administered 2017-02-15: 100 ug via INTRAVENOUS
  Administered 2017-02-15 (×4): 50 ug via INTRAVENOUS
  Administered 2017-02-15 (×2): 25 ug via INTRAVENOUS

## 2017-02-15 MED ORDER — FENTANYL CITRATE (PF) 100 MCG/2ML IJ SOLN
INTRAMUSCULAR | Status: AC
  Filled 2017-02-15: qty 4

## 2017-02-15 MED ORDER — DEXTROSE 5 % IV SOLN
1.5000 g | INTRAVENOUS | Status: AC
  Administered 2017-02-15: 1.5 g via INTRAVENOUS

## 2017-02-15 MED ORDER — ASPIRIN 81 MG PO CHEW
81.0000 mg | CHEWABLE_TABLET | Freq: Every day | ORAL | Status: DC
  Administered 2017-02-16 – 2017-02-22 (×7): 81 mg via ORAL
  Filled 2017-02-15 (×7): qty 1

## 2017-02-15 MED ORDER — PROPOFOL 10 MG/ML IV BOLUS
INTRAVENOUS | Status: DC | PRN
  Administered 2017-02-15: 20 mg via INTRAVENOUS
  Administered 2017-02-15: 150 mg via INTRAVENOUS

## 2017-02-15 MED ORDER — FENTANYL CITRATE (PF) 100 MCG/2ML IJ SOLN
INTRAMUSCULAR | Status: AC
Start: 2017-02-15 — End: 2017-02-15
  Filled 2017-02-15: qty 2

## 2017-02-15 MED ORDER — FENTANYL CITRATE (PF) 100 MCG/2ML IJ SOLN
INTRAMUSCULAR | Status: AC
  Filled 2017-02-15: qty 2

## 2017-02-15 MED ORDER — NALOXONE HCL 0.4 MG/ML IJ SOLN: 0.4000 mg | INTRAMUSCULAR | Status: DC | PRN

## 2017-02-15 MED ORDER — GABAPENTIN 300 MG PO CAPS
300.0000 mg | ORAL_CAPSULE | Freq: Every day | ORAL | Status: DC
  Administered 2017-02-16 – 2017-02-21 (×5): 300 mg via ORAL
  Filled 2017-02-15 (×5): qty 1

## 2017-02-15 MED ORDER — ACETAMINOPHEN 160 MG/5ML PO SOLN: 1000.0000 mg | Freq: Four times a day (QID) | ORAL | Status: AC

## 2017-02-15 MED ORDER — ROCURONIUM BROMIDE 100 MG/10ML IV SOLN
INTRAVENOUS | Status: DC | PRN
  Administered 2017-02-15: 50 mg via INTRAVENOUS
  Administered 2017-02-15: 20 mg via INTRAVENOUS

## 2017-02-15 MED ORDER — DEXTROSE-NACL 5-0.45 % IV SOLN
INTRAVENOUS | Status: DC
  Administered 2017-02-15 – 2017-02-16 (×2): via INTRAVENOUS

## 2017-02-15 MED ORDER — SODIUM CHLORIDE 0.9% FLUSH
10.0000 mL | Freq: Two times a day (BID) | INTRAVENOUS | Status: DC
  Administered 2017-02-15: 20 mL
  Administered 2017-02-17 – 2017-02-18 (×2): 10 mL

## 2017-02-15 MED ORDER — MIDAZOLAM HCL 2 MG/2ML IJ SOLN
1.7500 mg | Freq: Once | INTRAMUSCULAR | Status: AC
  Administered 2017-02-15: 1.75 mg via INTRAVENOUS

## 2017-02-15 MED ORDER — LACTATED RINGERS IV SOLN
INTRAVENOUS | Status: DC
  Administered 2017-02-15 (×2): via INTRAVENOUS

## 2017-02-15 MED ORDER — PANTOPRAZOLE SODIUM 40 MG PO TBEC
40.0000 mg | DELAYED_RELEASE_TABLET | Freq: Every day | ORAL | Status: DC
  Administered 2017-02-16 – 2017-02-22 (×7): 40 mg via ORAL
  Filled 2017-02-15 (×7): qty 1

## 2017-02-15 MED ORDER — ONDANSETRON HCL 4 MG/2ML IJ SOLN: 4.0000 mg | Freq: Four times a day (QID) | INTRAMUSCULAR | Status: DC | PRN

## 2017-02-15 MED ORDER — SODIUM CHLORIDE 0.9 % IV SOLN
30.0000 meq | Freq: Every day | INTRAVENOUS | Status: DC | PRN
  Filled 2017-02-15 (×2): qty 15

## 2017-02-15 MED ORDER — ACETAMINOPHEN 500 MG PO TABS
1000.0000 mg | ORAL_TABLET | Freq: Four times a day (QID) | ORAL | Status: AC
  Administered 2017-02-15 – 2017-02-19 (×10): 1000 mg via ORAL
  Filled 2017-02-15 (×10): qty 2

## 2017-02-15 MED ORDER — ENOXAPARIN SODIUM 40 MG/0.4ML ~~LOC~~ SOLN
40.0000 mg | Freq: Every day | SUBCUTANEOUS | Status: DC
  Administered 2017-02-16 – 2017-02-22 (×7): 40 mg via SUBCUTANEOUS
  Filled 2017-02-15 (×7): qty 0.4

## 2017-02-15 MED ORDER — BISACODYL 5 MG PO TBEC
10.0000 mg | DELAYED_RELEASE_TABLET | Freq: Every day | ORAL | Status: DC
  Administered 2017-02-16 – 2017-02-22 (×7): 10 mg via ORAL
  Filled 2017-02-15 (×7): qty 2

## 2017-02-15 MED ORDER — TALC (STERITALC) POWDER FOR INTRAPLEURAL USE
8.0000 g | Freq: Once | INTRAPLEURAL | Status: DC
  Filled 2017-02-15: qty 8

## 2017-02-15 MED ORDER — SODIUM CHLORIDE 0.9% FLUSH: 10.0000 mL | INTRAVENOUS | Status: DC | PRN

## 2017-02-15 MED ORDER — OXYBUTYNIN CHLORIDE 5 MG PO TABS
5.0000 mg | ORAL_TABLET | Freq: Three times a day (TID) | ORAL | Status: DC | PRN
  Administered 2017-02-15 – 2017-02-19 (×4): 5 mg via ORAL
  Filled 2017-02-15 (×5): qty 1

## 2017-02-15 MED ORDER — ALBUMIN HUMAN 5 % IV SOLN
INTRAVENOUS | Status: DC | PRN
  Administered 2017-02-15: 14:00:00 via INTRAVENOUS

## 2017-02-15 MED ORDER — LEVOTHYROXINE SODIUM 75 MCG PO TABS
150.0000 ug | ORAL_TABLET | ORAL | Status: DC
  Administered 2017-02-17 – 2017-02-22 (×5): 150 ug via ORAL
  Filled 2017-02-15 (×4): qty 2

## 2017-02-15 MED ORDER — SODIUM CHLORIDE 0.9% FLUSH: 9.0000 mL | INTRAVENOUS | Status: DC | PRN

## 2017-02-15 MED ORDER — SUGAMMADEX SODIUM 200 MG/2ML IV SOLN
INTRAVENOUS | Status: DC | PRN
  Administered 2017-02-15: 360 mg via INTRAVENOUS

## 2017-02-15 MED ORDER — FENTANYL CITRATE (PF) 100 MCG/2ML IJ SOLN
75.0000 ug | Freq: Once | INTRAMUSCULAR | Status: AC
  Administered 2017-02-15: 75 ug via INTRAVENOUS

## 2017-02-15 MED ORDER — LIDOCAINE 2% (20 MG/ML) 5 ML SYRINGE
INTRAMUSCULAR | Status: AC
  Filled 2017-02-15: qty 5

## 2017-02-15 MED ORDER — SENNOSIDES-DOCUSATE SODIUM 8.6-50 MG PO TABS
1.0000 | ORAL_TABLET | Freq: Every day | ORAL | Status: DC
  Administered 2017-02-15 – 2017-02-21 (×6): 1 via ORAL
  Filled 2017-02-15 (×7): qty 1

## 2017-02-15 MED ORDER — INSULIN ASPART 100 UNIT/ML ~~LOC~~ SOLN
0.0000 [IU] | SUBCUTANEOUS | Status: DC
  Administered 2017-02-15 (×2): 4 [IU] via SUBCUTANEOUS
  Administered 2017-02-16 (×2): 2 [IU] via SUBCUTANEOUS

## 2017-02-15 MED ORDER — 0.9 % SODIUM CHLORIDE (POUR BTL) OPTIME
TOPICAL | Status: DC | PRN
  Administered 2017-02-15: 4000 mL

## 2017-02-15 MED ORDER — TRAMADOL HCL 50 MG PO TABS
50.0000 mg | ORAL_TABLET | Freq: Four times a day (QID) | ORAL | Status: DC | PRN
  Administered 2017-02-16 (×2): 50 mg via ORAL
  Filled 2017-02-15 (×2): qty 1

## 2017-02-15 MED ORDER — DIPHENHYDRAMINE HCL 12.5 MG/5ML PO ELIX
12.5000 mg | ORAL_SOLUTION | Freq: Four times a day (QID) | ORAL | Status: DC | PRN
Start: 2017-02-15 — End: 2017-02-16

## 2017-02-15 MED ORDER — CHLORHEXIDINE GLUCONATE CLOTH 2 % EX PADS
6.0000 | MEDICATED_PAD | Freq: Every day | CUTANEOUS | Status: DC
  Administered 2017-02-16 – 2017-02-22 (×6): 6 via TOPICAL

## 2017-02-15 MED ORDER — ESCITALOPRAM OXALATE 10 MG PO TABS
10.0000 mg | ORAL_TABLET | Freq: Every day | ORAL | Status: DC
Start: 2017-02-16 — End: 2017-02-22
  Administered 2017-02-16 – 2017-02-21 (×5): 10 mg via ORAL
  Filled 2017-02-15 (×5): qty 1

## 2017-02-15 MED ORDER — FENTANYL 40 MCG/ML IV SOLN
INTRAVENOUS | Status: DC
  Administered 2017-02-15: 180 ug via INTRAVENOUS
  Administered 2017-02-15: 1000 ug via INTRAVENOUS
  Administered 2017-02-16: 100 ug via INTRAVENOUS
  Administered 2017-02-16: 60 ug via INTRAVENOUS
  Administered 2017-02-16: 20 ug via INTRAVENOUS
  Filled 2017-02-15: qty 25

## 2017-02-15 MED ORDER — ONDANSETRON HCL 4 MG/2ML IJ SOLN
INTRAMUSCULAR | Status: AC
  Filled 2017-02-15: qty 2

## 2017-02-15 MED ORDER — ROSUVASTATIN CALCIUM 20 MG PO TABS
20.0000 mg | ORAL_TABLET | Freq: Every day | ORAL | Status: DC
  Administered 2017-02-16 – 2017-02-22 (×6): 20 mg via ORAL
  Filled 2017-02-15 (×6): qty 1

## 2017-02-15 MED ORDER — EPHEDRINE SULFATE 50 MG/ML IJ SOLN
INTRAMUSCULAR | Status: DC | PRN
  Administered 2017-02-15 (×3): 10 mg via INTRAVENOUS

## 2017-02-15 MED ORDER — SUGAMMADEX SODIUM 200 MG/2ML IV SOLN
INTRAVENOUS | Status: AC
  Filled 2017-02-15: qty 2

## 2017-02-15 MED ORDER — MIDAZOLAM HCL 2 MG/2ML IJ SOLN
INTRAMUSCULAR | Status: AC
  Filled 2017-02-15: qty 2

## 2017-02-15 MED ORDER — PHENYLEPHRINE HCL 10 MG/ML IJ SOLN
INTRAVENOUS | Status: DC | PRN
  Administered 2017-02-15: 30 ug/min via INTRAVENOUS

## 2017-02-15 MED ORDER — LEVOTHYROXINE SODIUM 75 MCG PO TABS
75.0000 ug | ORAL_TABLET | ORAL | Status: DC
  Administered 2017-02-16 – 2017-02-18 (×2): 75 ug via ORAL
  Filled 2017-02-15 (×2): qty 1

## 2017-02-15 MED ORDER — DEXTROSE 5 % IV SOLN
1.5000 g | INTRAVENOUS | Status: DC
  Filled 2017-02-15: qty 1.5

## 2017-02-15 SURGICAL SUPPLY — 93 items
ADH SKN CLS APL DERMABOND .7 (GAUZE/BANDAGES/DRESSINGS) ×2
APPLICATOR COTTON TIP 6IN STRL (MISCELLANEOUS) ×3 IMPLANT
APPLIER CLIP ROT 10 11.4 M/L (STAPLE)
APR CLP MED LRG 11.4X10 (STAPLE)
BAG SPEC RTRVL LRG 6X4 10 (ENDOMECHANICALS)
CANISTER SUCT 3000ML PPV (MISCELLANEOUS) ×4 IMPLANT
CATH KIT ON Q 5IN SLV (PAIN MANAGEMENT) IMPLANT
CATH THORACIC 28FR (CATHETERS) ×2 IMPLANT
CATH THORACIC 28FR RT ANG (CATHETERS) IMPLANT
CATH THORACIC 36FR (CATHETERS) IMPLANT
CATH THORACIC 36FR RT ANG (CATHETERS) IMPLANT
CLIP APPLIE ROT 10 11.4 M/L (STAPLE) IMPLANT
CLIP TI MEDIUM 6 (CLIP) IMPLANT
CONN ST 1/4X3/8  BEN (MISCELLANEOUS) ×4
CONN ST 1/4X3/8 BEN (MISCELLANEOUS) IMPLANT
CONN Y 3/8X3/8X3/8  BEN (MISCELLANEOUS) ×2
CONN Y 3/8X3/8X3/8 BEN (MISCELLANEOUS) ×2 IMPLANT
CONT SPEC 4OZ CLIKSEAL STRL BL (MISCELLANEOUS) ×20 IMPLANT
COVER SURGICAL LIGHT HANDLE (MISCELLANEOUS) ×4 IMPLANT
DERMABOND ADVANCED (GAUZE/BANDAGES/DRESSINGS) ×2
DERMABOND ADVANCED .7 DNX12 (GAUZE/BANDAGES/DRESSINGS) ×2 IMPLANT
DRAIN CHANNEL 28F RND 3/8 FF (WOUND CARE) IMPLANT
DRAIN CHANNEL 32F RND 10.7 FF (WOUND CARE) ×4 IMPLANT
DRAPE C-ARM 42X72 X-RAY (DRAPES) ×4 IMPLANT
DRAPE LAPAROSCOPIC ABDOMINAL (DRAPES) ×4 IMPLANT
DRAPE WARM FLUID 44X44 (DRAPE) ×4 IMPLANT
ELECT BLADE 6.5 EXT (BLADE) ×2 IMPLANT
ELECT REM PT RETURN 9FT ADLT (ELECTROSURGICAL) ×4
ELECTRODE REM PT RTRN 9FT ADLT (ELECTROSURGICAL) ×2 IMPLANT
GAUZE SPONGE 4X4 12PLY STRL (GAUZE/BANDAGES/DRESSINGS) ×4 IMPLANT
GAUZE SPONGE 4X4 12PLY STRL LF (GAUZE/BANDAGES/DRESSINGS) ×6 IMPLANT
GLOVE BIO SURGEON STRL SZ 6 (GLOVE) ×6 IMPLANT
GLOVE SURG SIGNA 7.5 PF LTX (GLOVE) ×8 IMPLANT
GOWN STRL NON-REIN LRG LVL3 (GOWN DISPOSABLE) ×2 IMPLANT
GOWN STRL REUS W/ TWL LRG LVL3 (GOWN DISPOSABLE) ×4 IMPLANT
GOWN STRL REUS W/ TWL XL LVL3 (GOWN DISPOSABLE) ×2 IMPLANT
GOWN STRL REUS W/TWL LRG LVL3 (GOWN DISPOSABLE) ×8
GOWN STRL REUS W/TWL XL LVL3 (GOWN DISPOSABLE) ×4
HEMOSTAT SURGICEL 2X14 (HEMOSTASIS) IMPLANT
IV CATH 22GX1 FEP (IV SOLUTION) IMPLANT
KIT BASIN OR (CUSTOM PROCEDURE TRAY) ×4 IMPLANT
KIT PLEURX DRAIN CATH 1000ML (MISCELLANEOUS) ×4 IMPLANT
KIT PLEURX DRAIN CATH 15.5FR (DRAIN) ×4 IMPLANT
KIT ROOM TURNOVER OR (KITS) ×4 IMPLANT
KIT SUCTION CATH 14FR (SUCTIONS) ×4 IMPLANT
NS IRRIG 1000ML POUR BTL (IV SOLUTION) ×8 IMPLANT
PACK CHEST (CUSTOM PROCEDURE TRAY) ×4 IMPLANT
PACK GENERAL/GYN (CUSTOM PROCEDURE TRAY) ×4 IMPLANT
PAD ARMBOARD 7.5X6 YLW CONV (MISCELLANEOUS) ×8 IMPLANT
POUCH ENDO CATCH II 15MM (MISCELLANEOUS) IMPLANT
POUCH SPECIMEN RETRIEVAL 10MM (ENDOMECHANICALS) IMPLANT
RELOAD STAPLE 45 GOLD REG/THCK (STAPLE) IMPLANT
SEALANT PROGEL (MISCELLANEOUS) IMPLANT
SEALANT SURG COSEAL 4ML (VASCULAR PRODUCTS) IMPLANT
SEALANT SURG COSEAL 8ML (VASCULAR PRODUCTS) IMPLANT
SET DRAINAGE LINE (MISCELLANEOUS) IMPLANT
SOLUTION ANTI FOG 6CC (MISCELLANEOUS) ×4 IMPLANT
SPECIMEN JAR MEDIUM (MISCELLANEOUS) ×4 IMPLANT
SPONGE INTESTINAL PEANUT (DISPOSABLE) ×14 IMPLANT
SPONGE TONSIL 1 RF SGL (DISPOSABLE) ×4 IMPLANT
STAPLE RELOAD 45MM GOLD (STAPLE) ×8 IMPLANT
STAPLER ECHELON POWERED (MISCELLANEOUS) ×2 IMPLANT
SUT ETHILON 3 0 FSL (SUTURE) ×4 IMPLANT
SUT PROLENE 4 0 RB 1 (SUTURE) ×4
SUT PROLENE 4-0 RB1 .5 CRCL 36 (SUTURE) IMPLANT
SUT PROLENE 4-0 RB1 18X2 ARM (SUTURE) IMPLANT
SUT SILK  1 MH (SUTURE) ×6
SUT SILK 1 MH (SUTURE) ×4 IMPLANT
SUT SILK 2 0SH CR/8 30 (SUTURE) IMPLANT
SUT SILK 3 0SH CR/8 30 (SUTURE) IMPLANT
SUT VIC AB 1 CTX 36 (SUTURE)
SUT VIC AB 1 CTX36XBRD ANBCTR (SUTURE) IMPLANT
SUT VIC AB 2-0 CTX 36 (SUTURE) IMPLANT
SUT VIC AB 2-0 UR6 27 (SUTURE) IMPLANT
SUT VIC AB 3-0 MH 27 (SUTURE) IMPLANT
SUT VIC AB 3-0 X1 27 (SUTURE) ×4 IMPLANT
SUT VICRYL 2 TP 1 (SUTURE) IMPLANT
SWAB COLLECTION DEVICE MRSA (MISCELLANEOUS) IMPLANT
SYR 20CC LL (SYRINGE) IMPLANT
SYSTEM SAHARA CHEST DRAIN ATS (WOUND CARE) ×4 IMPLANT
TAPE CLOTH 4X10 WHT NS (GAUZE/BANDAGES/DRESSINGS) ×4 IMPLANT
TAPE CLOTH SURG 4X10 WHT LF (GAUZE/BANDAGES/DRESSINGS) ×6 IMPLANT
TIP APPLICATOR SPRAY EXTEND 16 (VASCULAR PRODUCTS) IMPLANT
TOWEL OR 17X24 6PK STRL BLUE (TOWEL DISPOSABLE) ×4 IMPLANT
TOWEL OR 17X26 10 PK STRL BLUE (TOWEL DISPOSABLE) ×8 IMPLANT
TRAP SPECIMEN MUCOUS 40CC (MISCELLANEOUS) IMPLANT
TRAY FOLEY W/METER SILVER 16FR (SET/KITS/TRAYS/PACK) ×4 IMPLANT
TROCAR XCEL BLADELESS 5X75MML (TROCAR) ×4 IMPLANT
TROCAR XCEL NON-BLD 5MMX100MML (ENDOMECHANICALS) IMPLANT
TUBE ANAEROBIC SPECIMEN COL (MISCELLANEOUS) IMPLANT
TUNNELER SHEATH ON-Q 11GX8 DSP (PAIN MANAGEMENT) IMPLANT
VALVE REPLACEMENT CAP (MISCELLANEOUS) IMPLANT
WATER STERILE IRR 1000ML POUR (IV SOLUTION) ×8 IMPLANT

## 2017-02-15 NOTE — Brief Op Note (Addendum)
02/15/2017  2:02 PM  PATIENT:  [place]  83 y.o. male  PRE-OPERATIVE DIAGNOSIS:  recurrent left pleural effusion  POST-OPERATIVE DIAGNOSIS:  recurrent left pleural effusion  PROCEDURE:  Procedure(s): VIDEO ASSISTED THORACOSCOPY (Left) DRAINAGE OF PLEURAL EFFUSION (Left) LUNG BIOPSY (Left)  PARIETAL PLEURECTOMY  SURGEON:  Surgeon(s) and Role:    * Melrose Nakayama, MD - Primary  PHYSICIAN ASSISTANT:  Nicholes Rough, PA-C   ANESTHESIA:   general  EBL:  Total I/O In: 1250 [I.V.:1000; IV Piggyback:250] Out: 1960 [Urine:210; PPIRJ:1884; Blood:500]  BLOOD ADMINISTERED:none  DRAINS: 2 Blake drains and one chest tube   LOCAL MEDICATIONS USED:  NONE  SPECIMEN:  Source of Specimen:  parietal pleura, lymph nodes  DISPOSITION OF SPECIMEN:  PATHOLOGY  COUNTS:  YES  PLAN OF CARE: Admit to inpatient   PATIENT DISPOSITION:  ICU - intubated and hemodynamically stable.   Delay start of Pharmacological VTE agent (>24hrs) due to surgical blood loss or risk of bleeding: yes  1 liter of amber pleural fluid. Unusual appearance of parietal pleura and portions of visceral pleura. Parietal pleura thickened and nodular with areas with a reticular pattern concerning for malignancy. Frozen - highly atypical cells suspicious for malignancy

## 2017-02-15 NOTE — Plan of Care (Signed)
Problem: Respiratory: Goal: Pain level will decrease with appropriate interventions Outcome: Not Progressing Pt with c/o pain, reeducated and encouraged PCA, using PRN med for breakthrough

## 2017-02-15 NOTE — Anesthesia Procedure Notes (Addendum)
Central Venous Catheter Insertion Performed by: Rica Koyanagi, anesthesiologist Start/End3/11/2017 10:45 AM, 02/15/2017 11:00 AM Patient location: OOR procedure area. Preanesthetic checklist: patient identified, IV checked, site marked, risks and benefits discussed, surgical consent, monitors and equipment checked, pre-op evaluation and timeout performed Position: Trendelenburg Lidocaine 1% used for infiltration and patient sedated Hand hygiene performed , maximum sterile barriers used  and Seldinger technique used Catheter size: 7.5 Fr Central line was placed.Double lumen Procedure performed using ultrasound guided technique. Ultrasound Notes:anatomy identified, needle tip was noted to be adjacent to the nerve/plexus identified and no ultrasound evidence of intravascular and/or intraneural injection Attempts: 1 Following insertion, line sutured, dressing applied and Biopatch. Post procedure assessment: blood return through all ports  Patient tolerated the procedure well with no immediate complications.

## 2017-02-15 NOTE — Progress Notes (Signed)
      [address]       [place] [address]             [phone]      S/p VATS  BP 111/82   Pulse 90   Temp 97.3 F (36.3 C) (Oral)   Resp 20   Ht 5' 10.5" (1.791 m)   Wt 198 lb (89.8 kg)   SpO2 93%   BMI 28.01 kg/m    Intake/Output Summary (Last 24 hours) at 02/15/17 2131 Last data filed at 02/15/17 2000  Gross per 24 hour  Intake             2450 ml  Output             2820 ml  Net             -370 ml   CXR OK postop  Stable early postop  C/o bladder spasms will try ditropan  Remo Lipps C. Roxan Hockey, MD [facility] [phone]

## 2017-02-15 NOTE — Anesthesia Postprocedure Evaluation (Addendum)
Anesthesia Post Note  Patient: Conn Trombetta Fehr  Procedure(s) Performed: Procedure(s) (LRB): VIDEO ASSISTED THORACOSCOPY (Left) DRAINAGE OF PLEURAL EFFUSION (Left) LUNG BIOPSY (Left)  Patient location during evaluation: PACU Anesthesia Type: General Level of consciousness: awake, sedated and oriented Pain management: satisfactory to patient Vital Signs Assessment: post-procedure vital signs reviewed and stable Respiratory status: spontaneous breathing, nonlabored ventilation, respiratory function stable and patient connected to nasal cannula oxygen Cardiovascular status: blood pressure returned to baseline and stable Postop Assessment: no signs of nausea or vomiting Anesthetic complications: no       Last Vitals:  Vitals:   02/15/17 1534 02/15/17 1547  BP:  109/81  Pulse: 88   Resp: 14   Temp:  36.7 C    Last Pain:  Vitals:   02/15/17 1547  TempSrc:   PainSc: Asleep                 Amontae Ng,JAMES TERRILL

## 2017-02-15 NOTE — Anesthesia Procedure Notes (Signed)
Procedure Name: Intubation Date/Time: 02/15/2017 11:42 AM Performed by: Lance Coon Pre-anesthesia Checklist: Patient identified, Emergency Drugs available, Suction available, Patient being monitored and Timeout performed Patient Re-evaluated:Patient Re-evaluated prior to inductionOxygen Delivery Method: Circle system utilized Preoxygenation: Pre-oxygenation with 100% oxygen Intubation Type: IV induction Ventilation: Mask ventilation without difficulty and Oral airway inserted - appropriate to patient size Laryngoscope Size: Miller and 3 Grade View: Grade I Endobronchial tube: Left and 37 Fr Number of attempts: 1 Airway Equipment and Method: Stylet Placement Confirmation: ETT inserted through vocal cords under direct vision,  positive ETCO2 and breath sounds checked- equal and bilateral Secured at: 29 cm Tube secured with: Tape Dental Injury: Teeth and Oropharynx as per pre-operative assessment

## 2017-02-15 NOTE — Interval H&P Note (Signed)
History and Physical Interval Note:  Repeat CT reviewed  02/15/2017 11:14 AM  Dean Pruitt  has presented today for surgery, with the diagnosis of recurrent left pleural effusion  The various methods of treatment have been discussed with the patient and family. After consideration of risks, benefits and other options for treatment, the patient has consented to  Procedure(s): VIDEO ASSISTED THORACOSCOPY (Left) DRAINAGE OF PLEURAL EFFUSION (Left) PLEURAL BIOPSY (Left) LUNG BIOPSY (Left) INSERTION PLEURAL DRAINAGE CATHETER (Left) TALC PLEURADESIS (Left) as a surgical intervention .  The patient's history has been reviewed, patient examined, no change in status, stable for surgery.  I have reviewed the patient's chart and labs.  Questions were answered to the patient's satisfaction.     Melrose Nakayama

## 2017-02-15 NOTE — Transfer of Care (Signed)
Immediate Anesthesia Transfer of Care Note  Patient: Dean Pruitt  Procedure(s) Performed: Procedure(s): VIDEO ASSISTED THORACOSCOPY (Left) DRAINAGE OF PLEURAL EFFUSION (Left) LUNG BIOPSY (Left)  Patient Location: PACU  Anesthesia Type:General  Level of Consciousness: awake and patient cooperative  Airway & Oxygen Therapy: Patient Spontanous Breathing and Patient connected to face mask oxygen  Post-op Assessment: Report given to RN and Post -op Vital signs reviewed and stable  Post vital signs: Reviewed and stable  Last Vitals:  Vitals:   02/15/17 1106 02/15/17 1109  BP:    Pulse: (!) 57 60  Resp: 11 (!) 31  Temp:      Last Pain:  Vitals:   02/15/17 0926  TempSrc: Oral      Patients Stated Pain Goal: 1 (04/16/01 1117)  Complications: No apparent anesthesia complications

## 2017-02-15 NOTE — H&P (View-Only) (Signed)
PCP is [name], MD Referring Provider is Tanda Rockers, MD  Chief Complaint  Patient presents with  . Pleural Effusion    Surgical eval for PleurX placement, CXR today    HPI: Dean Pruitt is an 83 year old gentleman sent for consultation regarding a recurring left pleural effusion.  Dean Pruitt is an 83 year old man with a past medical history significant for thyroid cancer in 2001 treated with thyroidectomy and radioactive iodine. His history also includes arthritis, cataracts, and hyperlipidemia. He has been found to have a 4.2 cm ascending aneurysm during this workup.   He was in his usual state of health until around Thanksgiving that he developed a sensation of tightness in his chest when he would take a deep breath. Chest x-ray showed a left pleural effusion. CT confirmed a pleural effusion and showed a question of a lung nodule as well. There did appear to also be an infiltrate in the left lung. He had a thoracentesis done on 11/03/2016 which drained 1.5 L of serous fluid. Cytologies were negative. Analysis of the fluid was consistent with an exudate with LDH of 97 and white count of 1074. He improved symptomatically after that but his symptoms recurred around the first the year and he had another thoracentesis on 12/08/2016 where 1.8 L of fluid was drained. Cytology showed some atypical cells but these were felt to likely be reactive mesothelial cells. Again he had symptomatically relieved but over about a month's time his symptoms recurred and he recently had a third thoracentesis. Cytology was again negative. He did not experience as much relief with the third thoracentesis as he did with the first 2.  He currently has a mild sensation of tightness in his chest with deep breath but otherwise no shortness of breath. He denies any substernal chest pressure, pain or tightness. His appetite is good he has not had any significant weight loss. He's not had any recent chills. He smoked  about a pack of cigarettes daily while he was in the [employer]. He quit in 1960.  Zubrod Score: At the time of surgery this patient's most appropriate activity status/level should be described as: '[x]'$     0    Normal activity, no symptoms '[]'$     1    Restricted in physical strenuous activity but ambulatory, able to do out light work '[]'$     2    Ambulatory and capable of self care, unable to do work activities, up and about >50 % of waking hours                              '[]'$     3    Only limited self care, in bed greater than 50% of waking hours '[]'$     4    Completely disabled, no self care, confined to bed or chair '[]'$     5    Moribund   Past Medical History:  Diagnosis Date  . Cataract   . Hyperlipidemia   . Thyroid cancer ([facility])    PMH of; on supressive therapy    Past Surgical History:  Procedure Laterality Date  . CATARACT EXTRACTION, BILATERAL  2013 & 2014   Dr Celene Squibb  . colon polyps  2002 & 2005   negative 2008; Dr Earlean Shawl  . THYROIDECTOMY  2001   S/P RAI    Family History  Problem Relation Age of Onset  . Alzheimer's disease Mother   .  Hypertension Mother   . Heart disease Father     CAD and angioplasty in 8s  . Cancer Father     Bladder  . Other Brother     valvular heart disease  . Diabetes Neg Hx   . Stroke Neg Hx     Social History Social History  Substance Use Topics  . Smoking status: Former Smoker    Packs/day: 1.00    Types: Cigarettes    Quit date: 12/07/1958  . Smokeless tobacco: Never Used     Comment: smoked 1956-1960, up 1 ppd  . Alcohol use 8.4 oz/week    14 Glasses of wine per week    Current Outpatient Prescriptions  Medication Sig Dispense Refill  . aspirin 81 MG tablet Take 81 mg by mouth daily.    Marland Kitchen escitalopram (LEXAPRO) 10 MG tablet Take 1 tablet (10 mg total) by mouth at bedtime. 90 tablet 1  . gabapentin (NEURONTIN) 300 MG capsule nightly 30 capsule 3  . levothyroxine (SYNTHROID, LEVOTHROID) 150 MCG tablet TAKE 1 TABLET (150  mcg) BY MOUTH 3 days a week, 75 mcg 4 days a week 45 tablet 1  . rosuvastatin (CRESTOR) 20 MG tablet Take 1 tablet (20 mg total) by mouth daily. (Patient taking differently: Take 20 mg by mouth every other day. ) 90 tablet 3   Current Facility-Administered Medications  Medication Dose Route Frequency Provider Last Rate Last Dose  . cyanocobalamin ((VITAMIN B-12)) injection 1,000 mcg  1,000 mcg Intramuscular Once Pieter Partridge, DO        No Known Allergies  Review of Systems  Constitutional: Positive for fatigue. Negative for unexpected weight change.  HENT: Negative for trouble swallowing and voice change.   Eyes: Negative for visual disturbance.  Respiratory: Positive for chest tightness (left side with deep breath).   Gastrointestinal: Positive for diarrhea. Negative for abdominal pain.  Genitourinary: Positive for difficulty urinating and frequency.  Musculoskeletal: Positive for arthralgias and joint swelling.  Neurological: Negative for seizures and syncope.       Memory loss  Hematological: Negative for adenopathy. Does not bruise/bleed easily.  Psychiatric/Behavioral: Positive for dysphoric mood.  All other systems reviewed and are negative.   BP 114/79   Pulse 72   Resp 20   Ht '5\' 11"'$  (1.803 m)   Wt 195 lb (88.5 kg)   SpO2 94%   BMI 27.20 kg/m  Physical Exam  Constitutional: He is oriented to person, place, and time. He appears well-developed and well-nourished. No distress.  HENT:  Head: Normocephalic and atraumatic.  Mouth/Throat: No oropharyngeal exudate.  Eyes: Conjunctivae and EOM are normal. No scleral icterus.  Neck: Neck supple. No thyromegaly present.  Cardiovascular: Normal rate, regular rhythm and normal heart sounds.   No murmur heard. Pulmonary/Chest: Effort normal. No respiratory distress. He has no wheezes. He has no rales.  Diminished breath sounds left base  Abdominal: Soft. He exhibits no distension. There is no tenderness.  Musculoskeletal: He  exhibits no edema.  Lymphadenopathy:    He has no cervical adenopathy.  Neurological: He is alert and oriented to person, place, and time. No cranial nerve deficit.  No focal motor deficit  Skin: Skin is warm and dry.  Vitals reviewed.    Diagnostic Tests: CT CHEST WITH CONTRAST  TECHNIQUE: Multidetector CT imaging of the chest was performed during intravenous contrast administration.  CONTRAST:  60m ISOVUE-300 IOPAMIDOL (ISOVUE-300) INJECTION 61%  COMPARISON:  12/31/2015, ultrasound the abdomen on 10/21/2016  FINDINGS: Cardiovascular: Heart size  is normal. There is extensive coronary artery calcification. Ascending aorta is 4.2 cm. No evidence for dissection. There is atherosclerotic calcifications of the thoracic aorta. Variant of normal arch anatomy. Left vertebral artery has a discrete origin.  Mediastinum/Nodes: Small mediastinal lymph nodes are identified. Precarinal node is 1.3 x 2.9 cm. Left hilar node is 1.6 x 0.6 cm.  Lungs/Pleura: Moderate left-sided pleural effusion associated with dependent atelectasis. Within the left upper lobe there is eighth pleural based nodule along the anterior medial pleural surface best seen on image 82 of series 2. This measures 9 x 11 mm. No other suspicious pleural based nodules are identified.  Upper Abdomen: Elevation of the right hemidiaphragm. Small hiatal hernia noted.  Musculoskeletal: Degenerative changes are seen in thoracic spine. Note is made of mild bilateral gynecomastia.  IMPRESSION: 1. Moderate left pleural effusion and left atelectasis. 2. **An incidental finding of potential clinical significance has been found. Left pleural based nodule measuring 10 mm. Consider one of the following in 3 months for both low-risk and high-risk individuals: (a) repeat chest CT, (b) follow-up PET-CT, or (c) Pruitt sampling. This recommendation follows the consensus statement: Guidelines for Management of Incidental  Pulmonary Nodules Detected on CT Images: From the Fleischner Society 2017; Radiology 2017; 284:228-243. ** 3. Coronary artery disease. 4. Elevation of the right hemidiaphragm. 5. Small hiatal hernia. 6. Gynecomastia.   Electronically Signed   By: Nolon Nations M.D.   On: 10/23/2016 11:38  CHEST  2 VIEW  COMPARISON:  02/01/2017  FINDINGS: There is small left pleural effusion with left basilar atelectasis. Right lung is clear. No pulmonary edema. Cardiomediastinal silhouette is stable. Mild degenerative changes thoracic spine are stable.  IMPRESSION: Small left pleural effusion with left basilar atelectasis. No pulmonary edema. Right lung is clear.   Electronically Signed   By: Lahoma Crocker M.D.   On: 02/10/2017 11:52 I personally reviewed the CT chest as well as the chest x-rays and concur with the findings noted above.  Impression: Dean Pruitt is an 83 year old man with a recurrent left pleural effusion that is exudative. He's had multiple negative cytology so I think malignancy is unlikely, but it has not been definitively ruled out. This also could be a post infectious or chronic inflammatory effusion. Regardless we need to try to establish an underlying cause for the effusion and try to prevent its recurrence.  I recommended that we proceed with left VATS for drainage of the effusion and pleural biopsies. We will also do a lung biopsy if there is reason to do so. We will then either place a pleural catheter or do talc pleurodesis (if malignant). I described the general nature of the operation to Mr. and Mrs. Pruitt. We discussed the need for general anesthesia, the incisions to be used, the use of drainage tubes postoperatively, the expected hospital stay, and the overall recovery. I reviewed the indications, risks, benefits, and alternatives. They understand the risk include, but are not limited to death, MI, DVT, PE, stroke, bleeding, possible need for transfusion,  infection, air leak, irregular heart rhythms, as well as possibility of other unforeseeable, occasions.  He understands and accepts the risks and wishes to proceed.  I do think we should repeat a CT prior to surgery as it has been about 4 months since that was done. There was a questionable lung nodule and also an infiltrative process in the lung as well on the previous CT  Plan:  Left VATS, drainage of pleural effusion, pleural biopsies, possible lung  biopsy, possible pleural catheter placement, possible talc pleurodesis on Monday, 02/15/2017.  Melrose Nakayama, MD [facility] [phone]

## 2017-02-15 NOTE — Anesthesia Preprocedure Evaluation (Signed)
Anesthesia Evaluation  Patient identified by MRN, date of birth, ID band Patient awake    History of Anesthesia Complications (+) history of anesthetic complications  Airway Mallampati: I  TM Distance: >3 FB     Dental   Pulmonary former smoker,  Pleural effusion   breath sounds clear to auscultation       Cardiovascular + Peripheral Vascular Disease   Rhythm:Regular Rate:Normal     Neuro/Psych    GI/Hepatic GERD  ,  Endo/Other  Hypothyroidism   Renal/GU      Musculoskeletal   Abdominal   Peds  Hematology   Anesthesia Other Findings   Reproductive/Obstetrics                             Anesthesia Physical Anesthesia Plan  ASA: II  Anesthesia Plan: General   Post-op Pain Management:    Induction: Intravenous  Airway Management Planned: Double Lumen EBT  Additional Equipment: Arterial line and CVP  Intra-op Plan:   Post-operative Plan: Extubation in OR  Informed Consent: I have reviewed the patients History and Physical, chart, labs and discussed the procedure including the risks, benefits and alternatives for the proposed anesthesia with the patient or authorized representative who has indicated his/her understanding and acceptance.   Dental advisory given  Plan Discussed with: CRNA  Anesthesia Plan Comments:         Anesthesia Quick Evaluation

## 2017-02-16 ENCOUNTER — Inpatient Hospital Stay (HOSPITAL_COMMUNITY): Payer: PPO

## 2017-02-16 ENCOUNTER — Encounter: Payer: PPO | Admitting: Thoracic Surgery (Cardiothoracic Vascular Surgery)

## 2017-02-16 ENCOUNTER — Encounter (HOSPITAL_COMMUNITY): Payer: Self-pay | Admitting: Thoracic Surgery (Cardiothoracic Vascular Surgery)

## 2017-02-16 LAB — BLOOD GAS, ARTERIAL
Acid-Base Excess: 0.4 mmol/L (ref 0.0–2.0)
Bicarbonate: 24.5 mmol/L (ref 20.0–28.0)
Drawn by: 398981
O2 Content: 3 L/min
O2 Saturation: 96.1 %
Patient temperature: 98.6
pCO2 arterial: 39.2 mmHg (ref 32.0–48.0)
pH, Arterial: 7.412 (ref 7.350–7.450)
pO2, Arterial: 85.8 mmHg (ref 83.0–108.0)

## 2017-02-16 LAB — CBC
HEMATOCRIT: 40.4 % (ref 39.0–52.0)
Hemoglobin: 13.5 g/dL (ref 13.0–17.0)
MCH: 31.4 pg (ref 26.0–34.0)
MCHC: 33.4 g/dL (ref 30.0–36.0)
MCV: 94 fL (ref 78.0–100.0)
Platelets: 187 10*3/uL (ref 150–400)
RBC: 4.3 MIL/uL (ref 4.22–5.81)
RDW: 13.3 % (ref 11.5–15.5)
WBC: 11.8 10*3/uL — AB (ref 4.0–10.5)

## 2017-02-16 LAB — BASIC METABOLIC PANEL
Anion gap: 7 (ref 5–15)
BUN: 17 mg/dL (ref 6–20)
CO2: 26 mmol/L (ref 22–32)
Calcium: 8.3 mg/dL — ABNORMAL LOW (ref 8.9–10.3)
Chloride: 103 mmol/L (ref 101–111)
Creatinine, Ser: 0.9 mg/dL (ref 0.61–1.24)
GFR calc Af Amer: >60 mL/min (ref >60)
GFR calc non Af Amer: >60 mL/min (ref >60)
Glucose, Bld: 151 mg/dL — ABNORMAL HIGH (ref 65–99)
Potassium: 4 mmol/L (ref 3.5–5.1)
Sodium: 136 mmol/L (ref 135–145)

## 2017-02-16 LAB — ACID FAST SMEAR (AFB)

## 2017-02-16 LAB — GLUCOSE, CAPILLARY
GLUCOSE-CAPILLARY: 145 mg/dL — AB (ref 65–99)
GLUCOSE-CAPILLARY: 117 mg/dL — AB (ref 65–99)
GLUCOSE-CAPILLARY: 120 mg/dL — AB (ref 65–99)
Glucose-Capillary: 139 mg/dL — ABNORMAL HIGH (ref 65–99)
Glucose-Capillary: 137 mg/dL — ABNORMAL HIGH (ref 65–99)

## 2017-02-16 LAB — ACID FAST SMEAR (AFB, MYCOBACTERIA): Acid Fast Smear: NEGATIVE

## 2017-02-16 MED ORDER — OXYCODONE HCL 5 MG PO TABS
5.0000 mg | ORAL_TABLET | ORAL | Status: DC | PRN
  Administered 2017-02-16: 5 mg via ORAL
  Filled 2017-02-16: qty 1

## 2017-02-16 MED ORDER — POTASSIUM CHLORIDE IN NACL 20-0.45 MEQ/L-% IV SOLN
INTRAVENOUS | Status: DC
  Administered 2017-02-16: 50 mL/h via INTRAVENOUS
  Administered 2017-02-17: 05:00:00 via INTRAVENOUS
  Filled 2017-02-16 (×2): qty 1000

## 2017-02-16 MED ORDER — INSULIN ASPART 100 UNIT/ML ~~LOC~~ SOLN
0.0000 [IU] | Freq: Three times a day (TID) | SUBCUTANEOUS | Status: DC
  Administered 2017-02-16 – 2017-02-21 (×4): 2 [IU] via SUBCUTANEOUS

## 2017-02-16 NOTE — Progress Notes (Signed)
14 ml/552mg of fentanyl wasted and witnessed by CHenrietta DineRN

## 2017-02-16 NOTE — Progress Notes (Signed)
Patient ID: Dean Pruitt, male   DOB: 22-Apr-1934, 83 y.o.   MRN: [id number]  SICU Evening Rounds:  Hemodynamically stable in sinus rhythm sats 92% on room air. Urine output ok CT output low Still a little fidgety   Awaiting bed on 4E

## 2017-02-16 NOTE — Progress Notes (Signed)
1 Day Post-Op Procedure(s) (LRB): VIDEO ASSISTED THORACOSCOPY (Left) DRAINAGE OF PLEURAL EFFUSION (Left) LUNG BIOPSY (Left) Subjective: Some confusion overnight- pulling at lines, tubes Denies pain  Objective: Vital signs in last 24 hours: Temp:  [97.3 F (36.3 C)-98.1 F (36.7 C)] 98.1 F (36.7 C) (03/13 0400) Pulse Rate:  [56-103] 74 (03/13 0800) Cardiac Rhythm: Normal sinus rhythm (03/13 0800) Resp:  [11-31] 23 (03/13 0400) BP: (98-162)/(69-108) 120/85 (03/13 0800) SpO2:  [89 %-99 %] 96 % (03/13 0800) Arterial Line BP: (90-154)/(62-95) 137/68 (03/13 0800) Weight:  [193 lb 12.6 oz (87.9 kg)-198 lb (89.8 kg)] 193 lb 12.6 oz (87.9 kg) (03/13 0530)  Hemodynamic parameters for last 24 hours:    Intake/Output from previous day: 03/12 0701 - 03/13 0700 In: 3650 [P.O.:100; I.V.:3250; IV Piggyback:300] Out: 3225 [Urine:675; Blood:500; Chest Tube:800] Intake/Output this shift: Total I/O In: 100 [I.V.:100] Out: -   General appearance: alert, cooperative and mild confusion' Neurologic: no focal deficit Heart: regular rate and rhythm Lungs: clear to auscultation bilaterally Abdomen: normal findings: soft, non-tender no air leak, serosanguinous drainage from CT  Lab Results:  Recent Labs  02/16/17 0430  WBC 11.8*  HGB 13.5  HCT 40.4  PLT 187   BMET:  Recent Labs  02/16/17 0430  NA 136  K 4.0  CL 103  CO2 26  GLUCOSE 151*  BUN 17  CREATININE 0.90  CALCIUM 8.3*    PT/INR: No results for input(s): LABPROT, INR in the last 72 hours. ABG    Component Value Date/Time   PHART 7.412 02/16/2017 0455   HCO3 24.5 02/16/2017 0455   O2SAT 96.1 02/16/2017 0455   CBG (last 3)   Recent Labs  02/15/17 1629 02/15/17 1938 02/15/17 2351  GLUCAP 170* 177* 160*    Assessment/Plan: S/P Procedure(s) (LRB): VIDEO ASSISTED THORACOSCOPY (Left) DRAINAGE OF PLEURAL EFFUSION (Left) LUNG BIOPSY (Left) Plan for transfer to step-down: see transfer orders  CV-  stable  RESP- s/p parietal pleurectomy-   Path pending  No air leak. Keep all CT to suction today  IS  NEURO- mild confusion. Will dc PCA, use PO pain meds  RENAL- creatinine and lytes OK  ENDO_ CBG elevated, change D5 1/2 NS to 1/2 NS  CBG/ SSI AC and HS  SCD + enoxaparin for DVT prophylaxis  Ambulate  Transfer to 4E when bed available   LOS: 1 day    Melrose Nakayama 02/16/2017

## 2017-02-16 NOTE — Care Management Note (Addendum)
Case Management Note  Patient Details  Name: Dean Pruitt MRN: [id number] Date of Birth: 03-06-1934  Subjective/Objective:    POD 1 VATS, drainage of pl effusion, lung bx with chest tube to suction, per MD will dc pca and transition to po. Patient lives with his wife, [name], she is great support for him at home.  He has medication coverage and a PCP.  Per Anne,if they need [place] services they would like [facility].   NCM will cont to follow for dc needs.    3/19 Gordon, BSN - patient is for dc today if, cxr ok, await pt/ot eval also.  Per RN, patient is unsteady will prob need rolling walker.  NCM will cont to follow for dc needs.              Action/Plan:   Expected Discharge Date:                  Expected Discharge Plan:  [place]  In-House Referral:     Discharge planning Services  CM Consult  Post Acute Care Choice:    Choice offered to:     DME Arranged:    DME Agency:     HH Arranged:    [place] Agency:     Status of Service:  In process, will continue to follow  If discussed at Long Length of Stay Meetings, dates discussed:    Additional Comments:  Zenon Mayo, RN 02/16/2017, 2:49 PM

## 2017-02-16 NOTE — Plan of Care (Signed)
Problem: Education: Goal: Knowledge of disease or condition will improve Outcome: Progressing Pt with intermittent confusion  Problem: Respiratory: Goal: Respiratory status will improve Outcome: Progressing Pulls 1500 on IS

## 2017-02-17 ENCOUNTER — Inpatient Hospital Stay (HOSPITAL_COMMUNITY): Payer: PPO

## 2017-02-17 DIAGNOSIS — Z9889 Other specified postprocedural states: Secondary | ICD-10-CM

## 2017-02-17 LAB — COMPREHENSIVE METABOLIC PANEL
ALT: 14 U/L — AB (ref 17–63)
AST: 23 U/L (ref 15–41)
Albumin: 3 g/dL — ABNORMAL LOW (ref 3.5–5.0)
Alkaline Phosphatase: 44 U/L (ref 38–126)
Anion gap: 7 (ref 5–15)
BILIRUBIN TOTAL: 1.1 mg/dL (ref 0.3–1.2)
BUN: 11 mg/dL (ref 6–20)
CHLORIDE: 101 mmol/L (ref 101–111)
CO2: 26 mmol/L (ref 22–32)
CREATININE: 0.81 mg/dL (ref 0.61–1.24)
Calcium: 8.7 mg/dL — ABNORMAL LOW (ref 8.9–10.3)
Glucose, Bld: 109 mg/dL — ABNORMAL HIGH (ref 65–99)
Potassium: 3.7 mmol/L (ref 3.5–5.1)
Sodium: 134 mmol/L — ABNORMAL LOW (ref 135–145)
TOTAL PROTEIN: 5.3 g/dL — AB (ref 6.5–8.1)

## 2017-02-17 LAB — URINALYSIS, COMPLETE (UACMP) WITH MICROSCOPIC
Bilirubin Urine: NEGATIVE
GLUCOSE, UA: NEGATIVE mg/dL
Ketones, ur: NEGATIVE mg/dL
Nitrite: NEGATIVE
PH: 6 (ref 5.0–8.0)
PROTEIN: NEGATIVE mg/dL
SQUAMOUS EPITHELIAL / LPF: NONE SEEN

## 2017-02-17 LAB — CBC
HEMATOCRIT: 38.5 % — AB (ref 39.0–52.0)
Hemoglobin: 12.7 g/dL — ABNORMAL LOW (ref 13.0–17.0)
MCH: 31.1 pg (ref 26.0–34.0)
MCHC: 33 g/dL (ref 30.0–36.0)
MCV: 94.4 fL (ref 78.0–100.0)
PLATELETS: 171 10*3/uL (ref 150–400)
RBC: 4.08 MIL/uL — ABNORMAL LOW (ref 4.22–5.81)
RDW: 13.3 % (ref 11.5–15.5)
WBC: 8.6 10*3/uL (ref 4.0–10.5)

## 2017-02-17 LAB — GLUCOSE, CAPILLARY
GLUCOSE-CAPILLARY: 101 mg/dL — AB (ref 65–99)
GLUCOSE-CAPILLARY: 128 mg/dL — AB (ref 65–99)
GLUCOSE-CAPILLARY: 137 mg/dL — AB (ref 65–99)
Glucose-Capillary: 92 mg/dL (ref 65–99)

## 2017-02-17 MED ORDER — LORAZEPAM 2 MG/ML IJ SOLN
2.0000 mg | INTRAMUSCULAR | Status: DC | PRN
  Administered 2017-02-17 – 2017-02-18 (×5): 2 mg via INTRAVENOUS
  Administered 2017-02-19: 3 mg via INTRAVENOUS
  Administered 2017-02-19: 2 mg via INTRAVENOUS
  Administered 2017-02-19: 3 mg via INTRAVENOUS
  Administered 2017-02-19: 2 mg via INTRAVENOUS
  Filled 2017-02-17: qty 2
  Filled 2017-02-17 (×2): qty 1
  Filled 2017-02-17 (×2): qty 2
  Filled 2017-02-17 (×5): qty 1

## 2017-02-17 MED ORDER — LORAZEPAM 2 MG/ML IJ SOLN
0.5000 mg | Freq: Four times a day (QID) | INTRAMUSCULAR | Status: DC | PRN
  Administered 2017-02-17: 0.5 mg via INTRAVENOUS
  Filled 2017-02-17: qty 1

## 2017-02-17 MED ORDER — SPIRITUS FRUMENTI
1.0000 | Freq: Every day | ORAL | Status: DC
  Administered 2017-02-18 – 2017-02-21 (×4): 1 via ORAL
  Filled 2017-02-17 (×10): qty 1

## 2017-02-17 NOTE — Progress Notes (Signed)
D/C anterior chest tube as well as right IJ. Patient tolerated procedure well. Will continue to monitor.

## 2017-02-17 NOTE — Progress Notes (Signed)
Called to patients room by tele sitter stating patient attempting to get out of bed and aggressive toward his wife, patient is now more confused, with increased hallucinations and agitation. CIWA score calculated at 14, PRN ativan of 0.5 given with no resolution in symptoms. Dr. Roxan Hockey notified and new orders received from stepdown CIWA protocol and bilateral wrist restraints. Will implement orders and continue to monitor.

## 2017-02-17 NOTE — Progress Notes (Signed)
Pt attempting to climb out of bed HR 120's claims we are holding him prisoner so that he cant do his taxes. Gave one dose of ativan IV infiltrated pt continued thrashing about sticking his foot in the bed rail to push his bottom up out of the bed new iv started and second dose of ativan given 2215 over a hour later pt now sleeping

## 2017-02-17 NOTE — Plan of Care (Signed)
Problem: Safety: Goal: Ability to remain free from injury will improve Outcome: Not Progressing Pt is hallucinating, agitated telesiter pt on ciwa protocol

## 2017-02-17 NOTE — Progress Notes (Addendum)
      [address]       [place] [address]             [phone]      2 Days Post-Op Procedure(s) (LRB): VIDEO ASSISTED THORACOSCOPY (Left) DRAINAGE OF PLEURAL EFFUSION (Left) LUNG BIOPSY (Left)   Subjective:  No specific complaints.  Wife at bedside and is concerned about hallucinations.  Patient is alert and oriented this morning.  He is pulling at his central line.  No BM  Objective: Vital signs in last 24 hours: Temp:  [97.6 F (36.4 C)-97.9 F (36.6 C)] 97.8 F (36.6 C) (03/13 2317) Pulse Rate:  [63-74] 66 (03/13 2317) Cardiac Rhythm: Normal sinus rhythm;Bundle branch block (03/14 0700) Resp:  [16] 16 (03/13 2317) BP: (107-128)/(69-85) 123/80 (03/13 2317) SpO2:  [91 %-96 %] 92 % (03/13 2317) Arterial Line BP: (137)/(68) 137/68 (03/13 0800)  Intake/Output from previous day: 03/13 0701 - 03/14 0700 In: 1870 [P.O.:720; I.V.:1150] Out: 740 [Urine:350; Chest Tube:390]  General appearance: alert, cooperative and no distress Heart: regular rate and rhythm Lungs: clear to auscultation bilaterally Abdomen: soft, non-tender; bowel sounds normal; no masses,  no organomegaly Extremities: extremities normal, atraumatic, no cyanosis or edema Wound: clean and dry  Lab Results:  Recent Labs  02/16/17 0430 02/17/17 0518  WBC 11.8* 8.6  HGB 13.5 12.7*  HCT 40.4 38.5*  PLT 187 171   BMET:  Recent Labs  02/16/17 0430 02/17/17 0518  NA 136 134*  K 4.0 3.7  CL 103 101  CO2 26 26  GLUCOSE 151* 109*  BUN 17 11  CREATININE 0.90 0.81  CALCIUM 8.3* 8.7*    PT/INR: No results for input(s): LABPROT, INR in the last 72 hours. ABG    Component Value Date/Time   PHART 7.412 02/16/2017 0455   HCO3 24.5 02/16/2017 0455   O2SAT 96.1 02/16/2017 0455   CBG (last 3)   Recent Labs  02/16/17 1200 02/16/17 1549 02/16/17 2104  GLUCAP 117* 120* 137*    Assessment/Plan: S/P Procedure(s) (LRB): VIDEO ASSISTED THORACOSCOPY (Left) DRAINAGE OF PLEURAL  EFFUSION (Left) LUNG BIOPSY (Left)  1. Chest tube- no air leak present, 390 cc output yesterday- CXR free from pneumothorax- will place chest tube to water seal and d/c posterior chest tube 2. Pulm- no acute issues, not on oxygen, continue IS 3. Neuro-post operative delirium, hallucinations- d/c all narcotics, patient is also not sleeping, takes [place] at home.. This could be contributing to hallucinations will speak with Dr. Roxan Hockey about use 4. H/O Alcohol use- per wife patient drinks 3 large glasses of wine per night... Could be suffering from mild alcohol withdrawal.. May benefit from some Ativan prn 5. CV-hemodynamically stable 6. Dispo- patient stable, d/c 1 chest tube today, place remaining to water seal, will d/c central line and place peripheral IV, delirium should resolve..will discuss ambien, alcohol withdrawal treatment with Dr. Roxan Hockey   LOS: 2 days    [place] 02/17/2017 Patient seen and examined, Will dc anterior tube as there is no air leak. Diaphragmatic and posterior tubes need to stay until drainage stops I ordered a glass of wine QHS and low dose ativan PRN for possible withdrawal. I think a lot of his confusion is sundowning C/o pain with urination- will check UA

## 2017-02-17 NOTE — Discharge Summary (Signed)
Physician Discharge Summary  Patient ID: Dean Pruitt MRN: [id number] DOB/AGE: 07/20/1934 83 y.o.  Admit date: 02/15/2017 Discharge date: 02/22/2017  Admission Diagnoses:  Patient Active Problem List   Diagnosis Date Noted  . Pleural effusion 02/15/2017  . Greater trochanteric bursitis of left hip 12/31/2016  . Pleural effusion, left 10/27/2016  . infrarenal abdominal aortic aneurysm without rupture ([place]) - [place] needed 2020 10/22/2016  . Left lumbar radiculopathy 10/12/2016  . Bursitis of left hip 09/30/2016  . Left foot drop 09/30/2016  . Toe pain, left 08/13/2016  . Myalgia 07/04/2016  . Mild cognitive impairment 07/03/2016  . Adjustment disorder with depressed mood 07/03/2016  . Frequency of urination 09/26/2014  . Macular degeneration 06/22/2011  . Fasting hyperglycemia 05/20/2009  . NONSPECIFIC ABNORMAL ELECTROCARDIOGRAM 05/20/2009  . THYROID CANCER, HX OF 05/20/2009  . History of colonic polyps 05/20/2009  . Hyperlipidemia 11/21/2007   Discharge Diagnoses: MALIGNANT MESOTHELIOMA  Patient Active Problem List   Diagnosis Date Noted  . Malignant mesothelioma of pleura ([place]) 02/19/2017  . History of lung biopsy 02/17/2017  . Pleural effusion 02/15/2017  . Greater trochanteric bursitis of left hip 12/31/2016  . Pleural effusion, left 10/27/2016  . infrarenal abdominal aortic aneurysm without rupture ([place]) - [place] needed 2020 10/22/2016  . Left lumbar radiculopathy 10/12/2016  . Bursitis of left hip 09/30/2016  . Left foot drop 09/30/2016  . Toe pain, left 08/13/2016  . Myalgia 07/04/2016  . Mild cognitive impairment 07/03/2016  . Adjustment disorder with depressed mood 07/03/2016  . Frequency of urination 09/26/2014  . Macular degeneration 06/22/2011  . Fasting hyperglycemia 05/20/2009  . NONSPECIFIC ABNORMAL ELECTROCARDIOGRAM 05/20/2009  . THYROID CANCER, HX OF 05/20/2009  . History of colonic polyps 05/20/2009  . Hyperlipidemia 11/21/2007   Discharged  Condition: good  History of Present Illness:  Dean Pruitt is an 83 yo white male with known history of Thyroid cancer treated with thyroidectomy in 2001.  He also has a history of arthritis, cataracts, and hyperlipidemia.  The patient was in his usual state of health until November, at which time he developed chest tightness when the patient would take a deep breath.  He presented for evaluation at which time CXR was obtained and showed a pleural effusion.  CT scan was also performed and showed a questionable lung nodule as well.  He underwent Thoracentesis on 11/03/2017 which drained 1.5 L of fluid and cytologies were negative.  The patients symptoms improved, but the again developed the first of the year  Repeat Thoracentesis was performed on 12/08/2016 which drained 1.8 L of fluid and and showed atypical cells.  The patient did not achieved as good of symptom relief as he did with the other drainage attempts.  He was evaluated by Dr. Melvyn Novas who ultimately felt the patient should be evaluated by Dr. Roxan Hockey for possible lung biopsy.  He was evaluated by Dr. Roxan Hockey on 02/10/2017 at which time he continued to experience mild chest tightness with deep inspiration.  He denied weight loss and he hasn't smoked since the 1960s.  At that visit it was felt the patient should have a repeat CT scan to assess his questionable pulmonary nodules.  It was also recommended the patient undergo VATs with drainage of pleural effusion and lung biopsy.  The risks and benefits of the procedure were explained to the patient and he was agreeable to proceed.  Hospital Course:   Dean Pruitt presented to [facility] on 02/15/2017.  He was taken to the operating  room and underwent Left VATs with drainage of pleural effusion, lung biopsy, and parietal pleurectomy.  He tolerated the procedure without difficulty, was extubated and taken to the SICU in stable condition.  During this stay in the SICU the patient developed post  operative delirium with hallucinations.  He attempted to pull out his chest tubes and lines.  Chest tubes were free from air leak.  He complained of bladder spasms and was started on Ditropan.  He was felt medically stable for transfer to the step down unit on POD #1.  The patient continued to have episodes of confusion and agitation overnight.  He was taken off all narcotic pain medications.  It was felt this was most likely due to sundowning.  His chest tubes remained free from air leak.  They were transitioned to water seal on POD #2.  His posterior chest tube was also removed without difficulty.  The patient has daily alcohol intake.  It was felt that he could have some minor alcohol withdrawal.  He was started on Ativan CYA protocol and a glass of wine a night.  The patients symptoms slowly improved.  He continued to have issues with sundowning throughout his stay.  He developed symptoms of dysuria.  UA was obtained and showed small leukocytosis.  Therefore, urine culture was obtained.  His chest tubes remained in place until drainage continued.  His SECOND chest tube was removed on 02/19/2017.  Follow up CXR remained stable.  His final chest tube was removed on 02/21/17.  He has been medically stable, confusion/agitation has resolved.  He is ambulating and felt medically stable for discharge home today.  Final pathology revealed malignant mesothelioma and report is pasted below.  Significant Diagnostic Studies: Pathology  1. Pleura, biopsy, Left Parietal EPITHELIOID MESOTHELIOMA 2. Pleura, biopsy, Left nodule EPITHELIOID MESOTHELIOMA 3. Pleura, peel, Left Parietal EPITHELIOID MESOTHELIOMA 4. Pleura, biopsy, Left nodule #2 EPITHELIOID MESOTHELIOMA 5. Lung, wedge biopsy/resection, Left Lower Lobe EPITHELIOID MESOTHELIOMA MICROSCOPIC METASTATIC PAPILLARY THYROID CARCINOMA (1 MM) 6. Lymph node, biopsy, 9 EPITHELIOID MESOTHELIOMA 7. Pleura, peel, Left Parietal #2 EPITHELIOID  MESOTHELIOMA  Treatments: surgery:   Left video-assisted thoracoscopy, drainage of left pleural effusion, lung biopsy, parietal pleurectomy.  Disposition:  Home  Discharge medications: Discharge Instructions    Discharge patient    Complete by:  As directed    Discharge disposition:  01-Home or Self Care   Discharge patient date:  02/22/2017     Allergies as of 02/22/2017   No Known Allergies     Medication List    TAKE these medications   ampicillin 500 MG capsule Commonly known as:  PRINCIPEN Take 1 capsule (500 mg total) by mouth every 6 (six) hours.   aspirin 81 MG tablet Take 81 mg by mouth daily.   escitalopram 10 MG tablet Commonly known as:  LEXAPRO Take 1 tablet (10 mg total) by mouth at bedtime.   gabapentin 300 MG capsule Commonly known as:  NEURONTIN nightly What changed:  how much to take  how to take this  when to take this  additional instructions   levothyroxine 150 MCG tablet Commonly known as:  SYNTHROID, LEVOTHROID TAKE 1 TABLET (150 mcg) BY MOUTH 3 days a week, 75 mcg 4 days a week What changed:  how much to take  how to take this  when to take this  additional instructions   omeprazole 20 MG capsule Commonly known as:  PRILOSEC Take 20 mg by mouth every other day.   PROBIOTIC PO  Take 2 tablets by mouth 2 (two) times daily.   rosuvastatin 20 MG tablet Commonly known as:  CRESTOR Take 1 tablet (20 mg total) by mouth daily. What changed:  when to take this   traMADol 50 MG tablet Commonly known as:  ULTRAM Take 1 tablet (50 mg total) by mouth every 12 (twelve) hours as needed for moderate pain.      Follow-up Information    Melrose Nakayama, MD Follow up on 03/09/2017.   Specialty:  Cardiothoracic Surgery Why:  Appointment is at 10:45, please get CXR at 10:15 at [facility] imaging located on first floor of our office building Contact information: [address] [place] [address] [phone]            Signed: John Giovanni 02/22/2017, 1:09 PM

## 2017-02-18 ENCOUNTER — Inpatient Hospital Stay (HOSPITAL_COMMUNITY): Payer: PPO

## 2017-02-18 LAB — GLUCOSE, CAPILLARY
GLUCOSE-CAPILLARY: 104 mg/dL — AB (ref 65–99)
GLUCOSE-CAPILLARY: 93 mg/dL (ref 65–99)
GLUCOSE-CAPILLARY: 112 mg/dL — AB (ref 65–99)
Glucose-Capillary: 102 mg/dL — ABNORMAL HIGH (ref 65–99)

## 2017-02-18 NOTE — Progress Notes (Addendum)
[address]       [place], [address]             [phone]      3 Days Post-Op Procedure(s) (LRB): VIDEO ASSISTED THORACOSCOPY (Left) DRAINAGE OF PLEURAL EFFUSION (Left) LUNG BIOPSY (Left) Subjective: Feels ok, much more calm and relaxed currently per wife  Objective: Vital signs in last 24 hours: Temp:  [97.7 F (36.5 C)-98.5 F (36.9 C)] 98.1 F (36.7 C) (03/15 0700) Pulse Rate:  [63-77] 67 (03/15 0700) Cardiac Rhythm: Normal sinus rhythm (03/15 0300) Resp:  [16-27] 16 (03/15 0700) BP: (118-128)/(73-96) 128/96 (03/15 0700) SpO2:  [93 %-99 %] 99 % (03/15 0700)  Hemodynamic parameters for last 24 hours:    Intake/Output from previous day: 03/14 0701 - 03/15 0700 In: -  Out: 525 [Urine:375; Chest Tube:150] Intake/Output this shift: No intake/output data recorded.  General appearance: alert and no distress Heart: regular rate and rhythm Lungs: clear to auscultation bilaterally Abdomen: benign Extremities: no edema Wound: incis healing well  Lab Results:  Recent Labs  02/16/17 0430 02/17/17 0518  WBC 11.8* 8.6  HGB 13.5 12.7*  HCT 40.4 38.5*  PLT 187 171   BMET:  Recent Labs  02/16/17 0430 02/17/17 0518  NA 136 134*  K 4.0 3.7  CL 103 101  CO2 26 26  GLUCOSE 151* 109*  BUN 17 11  CREATININE 0.90 0.81  CALCIUM 8.3* 8.7*    PT/INR: No results for input(s): LABPROT, INR in the last 72 hours. ABG    Component Value Date/Time   PHART 7.412 02/16/2017 0455   HCO3 24.5 02/16/2017 0455   O2SAT 96.1 02/16/2017 0455   CBG (last 3)   Recent Labs  02/17/17 1913 02/17/17 2221 02/18/17 0807  GLUCAP 128* 137* 104*    Meds Scheduled Meds: . acetaminophen  1,000 mg Oral Q6H   Or  . acetaminophen (TYLENOL) oral liquid 160 mg/5 mL  1,000 mg Oral Q6H  . aspirin  81 mg Oral Daily  . bisacodyl  10 mg Oral Daily  . Chlorhexidine Gluconate Cloth  6 each Topical Daily  . enoxaparin (LOVENOX) injection  40 mg Subcutaneous  Daily  . escitalopram  10 mg Oral QHS  . gabapentin  300 mg Oral QHS  . insulin aspart  0-15 Units Subcutaneous TID WC  . levothyroxine  150 mcg Oral Once per day on Sun Mon Wed Fri Sat   And  . levothyroxine  75 mcg Oral Once per day on Tue Thu  . pantoprazole  40 mg Oral Daily  . rosuvastatin  20 mg Oral Daily  . senna-docusate  1 tablet Oral QHS  . sodium chloride flush  10-40 mL Intracatheter Q12H  . spiritus frumenti  1 each Oral QPC supper   Continuous Infusions: . lactated ringers Stopped (02/15/17 1411)   PRN Meds:.LORazepam, oxybutynin, potassium chloride (KCL MULTIRUN) 30 mEq in 265 mL IVPB, sodium chloride flush  Xrays Dg Chest Port 1 View  Result Date: 02/18/2017 CLINICAL DATA:  Chest tube. EXAM: PORTABLE CHEST 1 VIEW COMPARISON:  02/17/2017. FINDINGS: Interim removal of right IJ line in the upper left chest tube. Two lower chest tubes are in stable position. Tiny left pneumothorax cannot be excluded. Cardiomegaly. Low lung volumes with persistent basilar atelectasis with slight improvement from prior exam. Small left pleural effusion cannot be excluded. No acute bony abnormality. Surgical clips noted in the neck. IMPRESSION: 1. Interim removal of right IJ line and upper  left chest tube. Two lower left chest tubes are in stable position . Tiny left pneumothorax cannot be excluded. 2.  Cardiomegaly. 3. Interim partial clearing of basilar atelectasis. Small left pleural effusion. Critical Value/emergent results were called by telephone at the time of interpretation on 02/18/2017 at 7:36 am to nurse Armen Pickup , who verbally acknowledged these results. Electronically Signed   By: Marcello Moores  Register   On: 02/18/2017 07:37   Dg Chest Port 1 View  Result Date: 02/17/2017 CLINICAL DATA:  Recent pleural effusion.  Chest tube placed EXAM: PORTABLE CHEST 1 VIEW COMPARISON:  February 16, 2017 FINDINGS: Chest tube positions on the left are unchanged. Central catheter tip is in the superior vena  cava. No pneumothorax. There is a minimal left pleural effusion with left base atelectasis. There is also atelectasis in the right base, stable. There is no new opacity. Heart is mildly enlarged with pulmonary vascularity within normal limits. No adenopathy. There are surgical clips in the lower cervical and upper thoracic regions. IMPRESSION: Bibasilar atelectatic change with minimal left pleural effusion. No change in chest tube positions. No pneumothorax. Stable cardiac silhouette. Electronically Signed   By: Lowella Grip III M.D.   On: 02/17/2017 08:00    Assessment/Plan: S/P Procedure(s) (LRB): VIDEO ASSISTED THORACOSCOPY (Left) DRAINAGE OF PLEURAL EFFUSION (Left) LUNG BIOPSY (Left)  1 doing better currently not agitated- wants restraints off- will order. Wife will alert nurses if he starts to get agitated and pull on things 2 250 ml from CT's yesterday, keep tubes for now, tiny pntx- no air leak, cont H20 seal 3 pulm toilet and mobilize- routine 4 hemodyn stable 5 sugars ok control 6 ativan protocol 7 Culture urine as may have early UTI  LOS: 3 days    GOLD,WAYNE E 02/18/2017 Still confused but calm with wife at bedside Will keep both tubes until drainage down Cytology showed malignant mesothelioma- informed patient and wife. She is understandably upset with the news.  Revonda Standard Roxan Hockey, MD [facility] [phone]

## 2017-02-18 NOTE — Op Note (Signed)
NAME:  Dean Pruitt, Dean Pruitt NO.:  [id number]  MEDICAL RECORD NO.:  [id number]  LOCATION:                                 FACILITY:  PHYSICIAN:  Revonda Standard. Roxan Hockey, M.D.DATE OF BIRTH:  [birth year]  DATE OF PROCEDURE:  02/15/2017 DATE OF DISCHARGE:                              OPERATIVE REPORT   PREOPERATIVE DIAGNOSIS:  Recurrent left pleural effusion.  POSTOPERATIVE DIAGNOSIS:  Recurrent left pleural effusion.  PROCEDURE:  Left video-assisted thoracoscopy, drainage of left pleural effusion, lung biopsy, parietal pleurectomy.  SURGEON:  Revonda Standard. Roxan Hockey, MD.  ASSISTANT:  Nicholes Rough, PA.  ANESTHESIA:  General.  FINDINGS:  1 L of amber pleural fluid.  Unusual appearance of the parietal pleura and portions of the visceral pleura.  Parietal pleura was thickened and nodular with areas of reticular pattern concerning for malignancy.  Frozen section of the parietal pleura showed highly atypical cells suspicious for malignancy.  Visceral pleura with similar changes over smaller areas.  CLINICAL NOTE:  Dean Pruitt is an 83 year old man with a past medical history significant for thyroid cancer and arthritis.  Around Thanksgiving, he developed sensation of tightness in his chest and a chest x-ray showed a left pleural effusion.  He had multiple thoracentesis but rapidly reaccumulated the fluid each time.  Cytologies were negative.  He was referred for consideration for surgical intervention.  Because there was no diagnosis, he was advised to undergo VATS for drainage of the effusion and pleural biopsies.  We discussed possible talc pleurodesis or pleural catheter placement depending on intraoperative findings.  The indications, risks, benefits, and alternatives were discussed in detail with the patient.  He understood and accepted the risks and agreed to proceed.  OPERATIVE NOTE:  Dean Pruitt was brought to the preoperative holding area on February 15, 2017.   Anesthesia placed a central line and an arterial blood pressure monitoring line.  Intravenous antibiotics were administered.  He was taken to the operating room, anesthetized, and intubated with a double-lumen endotracheal tube.  A Foley catheter was placed.  Sequential compression devices were placed on the calves for DVT prophylaxis.  He was placed in a right lateral decubitus position and the left chest was prepped and draped in usual sterile fashion. Single lung ventilation of the right lung was initiated and was well tolerated throughout the procedure.  An incision was made in the seventh intercostal space in the midaxillary line.  A sucker was placed into the chest and approximately a liter of amber fluid was evacuated.  This was sent for cytology and cultures.  A small working incision was made in the fifth interspace anterolaterally. This was a 5 cm incision.  No rib spreading was performed during the procedure.  A port was inserted through the original incision and the thoracoscope was advanced into the chest.  The parietal pleura was diffusely thickened.  There was the appearance of some small nodules.  Some areas had a reticulating pattern.  This pattern was also seen on some of the visceral pleura, particularly along the lingular segment and the lower lobe.  It was unclear if this was inflammatory or possibly malignant in nature.  Biopsies were taken  from the parietal pleura and sent for frozen section.  They showed some highly atypical cells suspicious for malignancy, but was not definitively diagnostic of malignancy.  Based on the findings, decision was made to perform a parietal pleurectomy for the best hope of achieving pleural symphysis. The parietal pleura came off relatively easily in rather large sheets. There was bleeding from the underlying fat and soft tissue on the chest wall.  The pleura was stripped down to the level of the aorta posteriorly.  The tissue was  sent for pathology.  A biopsy was taken using a 45 mm echelon stapler with gold cartridges.  This was taken from the lower lobe and this was sent for permanent pathology.  The chest was copiously irrigated with warm saline.  Three chest tubes were placed, 2 Blake drains, and a regular standard chest tube was placed anteriorly. The Blake drains were placed along the diaphragm and then posteriorly along the chest wall.  They were secured to skin with #1 silk sutures. The lung was reinflated and there was good re-expansion of both the upper and lower lobes.  The working incision was closed in 3 layers. Dermabond was applied.  The chest tubes were placed to suction.  The patient was placed back in a supine position.  He then was extubated in the operating room and taken to the postanesthetic care unit in good condition.     Revonda Standard Roxan Hockey, M.D.   ______________________________ Revonda Standard. Roxan Hockey, M.D.    SCH/MEDQ  D:  02/17/2017  T:  02/17/2017  Job:  [id number]

## 2017-02-19 ENCOUNTER — Inpatient Hospital Stay (HOSPITAL_COMMUNITY): Payer: PPO

## 2017-02-19 DIAGNOSIS — C450 Mesothelioma of pleura: Secondary | ICD-10-CM

## 2017-02-19 LAB — GLUCOSE, CAPILLARY
GLUCOSE-CAPILLARY: 99 mg/dL (ref 65–99)
GLUCOSE-CAPILLARY: 100 mg/dL — AB (ref 65–99)
Glucose-Capillary: 107 mg/dL — ABNORMAL HIGH (ref 65–99)
Glucose-Capillary: 117 mg/dL — ABNORMAL HIGH (ref 65–99)

## 2017-02-19 MED ORDER — HALOPERIDOL LACTATE 5 MG/ML IJ SOLN
2.0000 mg | Freq: Once | INTRAMUSCULAR | Status: AC
  Administered 2017-02-19: 2 mg via INTRAVENOUS
  Filled 2017-02-19: qty 1

## 2017-02-19 NOTE — Care Management Important Message (Signed)
Important Message  Patient Details  Name: Dean Pruitt MRN: [id number] Date of Birth: 19-Dec-1933   Medicare Important Message Given:  Yes    Nathen May 02/19/2017, 3:49 PM

## 2017-02-19 NOTE — Progress Notes (Addendum)
      [address]       [place] [address]             [phone]      4 Days Post-Op Procedure(s) (LRB): VIDEO ASSISTED THORACOSCOPY (Left) DRAINAGE OF PLEURAL EFFUSION (Left) LUNG BIOPSY (Left)   Subjective:  No new complaints.  Patient was finally resting per wife who is at bedside, she states patient did okay overnight, but wasn't until 2nd dose of Ativan that he calmed down.  She wants to take her husband home and is hopeful it can be over the weekend.    Objective: Vital signs in last 24 hours: Temp:  [97.1 F (36.2 C)-98.5 F (36.9 C)] 97.5 F (36.4 C) (03/16 0805) Pulse Rate:  [58-75] 58 (03/16 0805) Cardiac Rhythm: Normal sinus rhythm;Bundle branch block (03/15 1900) Resp:  [17-20] 17 (03/16 0805) BP: (113-140)/(75-87) 133/85 (03/16 0805) SpO2:  [93 %-97 %] 94 % (03/16 0805)  Intake/Output from previous day: 03/15 0701 - 03/16 0700 In: 60 [P.O.:60] Out: 850 [Urine:750; Chest Tube:100] Intake/Output this shift: Total I/O In: -  Out: 200 [Urine:200]  General appearance: sleeping Heart: regular rate and rhythm Lungs: diminished breath sounds bibasilar Abdomen: soft, non-tender; bowel sounds normal; no masses,  no organomegaly Wound: clean and dry  Lab Results:  Recent Labs  02/17/17 0518  WBC 8.6  HGB 12.7*  HCT 38.5*  PLT 171   BMET:  Recent Labs  02/17/17 0518  NA 134*  K 3.7  CL 101  CO2 26  GLUCOSE 109*  BUN 11  CREATININE 0.81  CALCIUM 8.7*    PT/INR: No results for input(s): LABPROT, INR in the last 72 hours. ABG    Component Value Date/Time   PHART 7.412 02/16/2017 0455   HCO3 24.5 02/16/2017 0455   O2SAT 96.1 02/16/2017 0455   CBG (last 3)   Recent Labs  02/18/17 1712 02/18/17 2117 02/19/17 0800  GLUCAP 102* 112* 99    Assessment/Plan: S/P Procedure(s) (LRB): VIDEO ASSISTED THORACOSCOPY (Left) DRAINAGE OF PLEURAL EFFUSION (Left) LUNG BIOPSY (Left)  1.  Chest tube- 100 cc output yesterday, no  air leak present- may be able to remove another chest tube today 2. Pulm- no acute issues, off oxygen, continue IS, CXR remains stable 3. Alcohol use- continue CYA protocol, and alcoholic beverage at night 4. GU- UA + for leukocytes, culture remains pending 5. Malignant Mesothelioma- will f/u with oncology as outpatient 6. Dispo- patient stable, continue CYA protocol, hopefully remove 1 chest tube today   LOS: 4 days    BARRETT, ERIN 02/19/2017 Patient seen and examined, agree with above I was planning to dc both CT today but he has drained about 175 ml since pleuravac changed at 7. Will dc diaphragmatic tube, keep posterior tube to water seal  Remo Lipps C. Roxan Hockey, MD [facility] [phone]

## 2017-02-19 NOTE — Plan of Care (Signed)
Problem: Education: Goal: Knowledge of [facility] Education information/materials will improve Outcome: Not Progressing Explained to Patient that he should call when he needed something and not get it himself and not rish pulling his chest tube or falling.

## 2017-02-19 NOTE — Discharge Instructions (Signed)
Video-Assisted Thoracic Surgery, Care After ° °This sheet gives you information about how to care for yourself after your procedure. Your health care provider may also give you more specific instructions. If you have problems or questions, contact your health care provider. °What can I expect after the procedure? °After the procedure, it is common to have: °· Some pain and soreness in your chest. °· Pain when breathing in (inhaling) and coughing. °· Constipation. °· Fatigue. °· Difficulty sleeping. ° °Follow these instructions at home: °Preventing pneumonia °· Take deep breaths or do breathing exercises as instructed by your health care provider. Doing this helps prevent lung infection (pneumonia). °· Cough frequently. Coughing may cause discomfort, but it is important to clear mucus (phlegm) and expand your lungs. If it hurts to cough, hold a pillow against your chest or place the palms of both hands on top of the incision (use splinting) when you cough. This may help relieve discomfort. °· If you were given an incentive spirometer, use it as directed. An incentive spirometer is a tool that measures how well you are filling your lungs with each breath. °· Participate in pulmonary rehabilitation as directed by your health care provider. This is a program that combines education, exercise, and support from a team of specialists. The goal is to help you heal and get back to your normal activities as soon as possible. °Medicines °· Take over-the-counter or prescription medicines only as told by your health care provider. °· If you have pain, take pain-relieving medicine before your pain becomes severe. This is important because if your pain is under control, you will be able to breathe and cough more comfortably. °· If you were prescribed an antibiotic medicine, take it as told by your health care provider. Do not stop taking the antibiotic even if you start to feel better. °Activity °· Ask your health care provider  what activities are safe for you. °· Avoid activities that use your chest muscles for at least 3-4 weeks. °· Do not lift anything that is heavier than 10 lb (4.5 kg), or the limit that your health care provider tells you, until he or she says that it is safe. °Incision care °· Follow instructions from your health care provider about how to take care of your incision(s). Make sure you: °? Wash your hands with soap and water before you change your bandage (dressing). If soap and water are not available, use hand sanitizer. °? Change your dressing as told by your health care provider. °? Leave stitches (sutures), skin glue, or adhesive strips in place. These skin closures may need to stay in place for 2 weeks or longer. If adhesive strip edges start to loosen and curl up, you may trim the loose edges. Do not remove adhesive strips completely unless your health care provider tells you to do that. °· Keep your dressing dry until it has been removed. °· Check your incision area every day for signs of infection. Check for: °? Redness, swelling, or pain. °? Fluid or blood. °? Warmth. °? Pus or a bad smell. °Bathing °· Do not take baths, swim, or use a hot tub until your health care provider approves. You may take showers. °· After your dressing has been removed, use soap and water to gently wash your incision area. Do not use anything else to clean your incision(s) unless your health care provider tells you to do this. °Driving °· Do not drive until your health care provider approves. °· Do not drive   or use heavy machinery while taking prescription pain medicine. °Eating and drinking °· Eat a healthy, balanced diet as instructed by your health care provider. A healthy diet includes plenty of fresh fruits and vegetables, whole grains, and low-fat (lean) proteins. °· Limit foods that are high in fat and processed sugars, such as fried and sweet foods. °· Drink enough fluid to keep your urine clear or pale yellow. °General  instructions °· To prevent or treat constipation while you are taking prescription pain medicine, your health care provider may recommend that you: °? Take over-the-counter or prescription medicines. °? Eat foods that are high in fiber, such as beans, fresh fruits and vegetables, and whole grains. °· Do not use any products that contain nicotine or tobacco, such as cigarettes and e-cigarettes. If you need help quitting, ask your health care provider. °· Avoid secondhand smoke. °· Wear compression stockings as told by your health care provider. These stockings help to prevent blood clots and reduce swelling in your legs. °· If you have a chest tube, care for it as instructed by your health care provider. Do not travel by airplane during the 2 weeks after your chest tube is removed, or until your health care provider says that this is safe. °· Keep all follow-up visits as told by your health care provider. This is important. °Contact a health care provider if: °· You have redness, swelling, or pain around an incision. °· You have fluid or blood coming from an incision. °· Your incision area feels warm to the touch. °· You have pus or a bad smell coming from an incision. °· You have a fever or chills. °· You have nausea or vomiting. °· You have pain that does not get better with medicine. °Get help right away if: °· You have chest pain. °· Your heart is fluttering or beating rapidly. °· You develop a rash. °· You have shortness of breath or trouble breathing. °· You are confused. °· You have trouble speaking. °· You feel weak, light-headed, or dizzy. °· You faint. °Summary °· To help prevent lung infection (pneumonia), take deep breaths or do breathing exercises as instructed by your health care provider. °· Cough frequently to clear mucus (phlegm) and expand your lungs. If it hurts to cough, hold a pillow against your chest or place the palms of both hands on top of the incision (use splinting) when you cough. °· If  you have pain, take pain-relieving medicine before your pain becomes severe. This is important because if your pain is under control, you will be able to breathe and cough more comfortably. °· Ask your health care provider what activities are safe for you. °This information is not intended to replace advice given to you by your health care provider. Make sure you discuss any questions you have with your health care provider. °Document Released: 03/20/2013 Document Revised: 11/02/2016 Document Reviewed: 11/02/2016 °Elsevier Interactive Patient Education © 2017 [employer] ° °

## 2017-02-19 NOTE — Plan of Care (Signed)
Problem: Education: Goal: Knowledge of [facility] Education information/materials will improve Outcome: Not Progressing Explained to patient when to call RN verses NT

## 2017-02-19 NOTE — Progress Notes (Signed)
Patient extremely agitated, pulling at lines and chest tube, getting out of bed. Ativan given without apparent relief. MD notified restraint order obtain as well as supplemental Haldol order. Orders carried out will closely monitor patient.

## 2017-02-19 NOTE — Progress Notes (Signed)
Removed anterior chest tube per MD order without any complications or issues. Patient reported that he felt better after the tube was removed and easily fell asleep. Patient continues to have the posterior chest tube to water seal. New dressing applied. Will continue to monitor.

## 2017-02-20 ENCOUNTER — Inpatient Hospital Stay (HOSPITAL_COMMUNITY): Payer: PPO

## 2017-02-20 LAB — URINE CULTURE: Culture: 20000 — AB

## 2017-02-20 LAB — GLUCOSE, CAPILLARY
GLUCOSE-CAPILLARY: 97 mg/dL (ref 65–99)
GLUCOSE-CAPILLARY: 124 mg/dL — AB (ref 65–99)
GLUCOSE-CAPILLARY: 115 mg/dL — AB (ref 65–99)
Glucose-Capillary: 88 mg/dL (ref 65–99)

## 2017-02-20 LAB — CULTURE, BODY FLUID-BOTTLE

## 2017-02-20 LAB — CULTURE, BODY FLUID W GRAM STAIN -BOTTLE: Culture: NO GROWTH

## 2017-02-20 MED ORDER — AMPICILLIN 500 MG PO CAPS
500.0000 mg | ORAL_CAPSULE | Freq: Four times a day (QID) | ORAL | Status: DC
  Administered 2017-02-20 – 2017-02-22 (×8): 500 mg via ORAL
  Filled 2017-02-20 (×12): qty 1

## 2017-02-20 NOTE — Progress Notes (Addendum)
      [address]       [place] [address]             [phone]      5 Days Post-Op Procedure(s) (LRB): VIDEO ASSISTED THORACOSCOPY (Left) DRAINAGE OF PLEURAL EFFUSION (Left) LUNG BIOPSY (Left) Subjective: Sleepy this morning. Received Haldol last night  Objective: Vital signs in last 24 hours: Temp:  [97.4 F (36.3 C)-98.3 F (36.8 C)] 97.6 F (36.4 C) (03/17 0700) Pulse Rate:  [58-79] 58 (03/17 0700) Cardiac Rhythm: Normal sinus rhythm (03/17 0700) Resp:  [17-21] 20 (03/17 0700) BP: (97-117)/(69-84) 102/69 (03/17 0700) SpO2:  [90 %-98 %] 94 % (03/17 0700)     Intake/Output from previous day: 03/16 0701 - 03/17 0700 In: 120 [P.O.:120] Out: 1160 [Urine:950; Chest Tube:210] Intake/Output this shift: Total I/O In: 480 [P.O.:480] Out: -   General appearance: cooperative and no distress Heart: regular rate and rhythm, S1, S2 normal, no murmur, click, rub or gallop Lungs: clear to auscultation bilaterally Abdomen: soft, non-tender; bowel sounds normal; no masses,  no organomegaly Extremities: extremities normal, atraumatic, no cyanosis or edema Wound: clean and dry  Lab Results: No results for input(s): WBC, HGB, HCT, PLT in the last 72 hours. BMET: No results for input(s): NA, K, CL, CO2, GLUCOSE, BUN, CREATININE, CALCIUM in the last 72 hours.  PT/INR: No results for input(s): LABPROT, INR in the last 72 hours. ABG    Component Value Date/Time   PHART 7.412 02/16/2017 0455   HCO3 24.5 02/16/2017 0455   O2SAT 96.1 02/16/2017 0455   CBG (last 3)   Recent Labs  02/19/17 1743 02/19/17 2129 02/20/17 0845  GLUCAP 107* 117* 88    Assessment/Plan: S/P Procedure(s) (LRB): VIDEO ASSISTED THORACOSCOPY (Left) DRAINAGE OF PLEURAL EFFUSION (Left) LUNG BIOPSY (Left)  1. Chest tube on waterseal without leak. CXR from this morning showed no pneumothorax. On room air.  2. HR, NSR and sinus bradycardia  Plan: Remove chest tube today with a CXR  to follow. CXR two view in the morning and possible discharge in the next 1-2 days. Work on ambulation this morning. Once more awake can remove foley catheter.    LOS: 5 days    Elgie Collard 02/20/2017 Patient seen and examined, agree with above Drainage down dc chest tube Sundowning continues to be an issue, hopefully will improve with removing CT Possibly home tomorrow if mental status allows  Remo Lipps C. Roxan Hockey, MD [facility] [phone]

## 2017-02-21 ENCOUNTER — Inpatient Hospital Stay (HOSPITAL_COMMUNITY): Payer: PPO

## 2017-02-21 LAB — GLUCOSE, CAPILLARY
GLUCOSE-CAPILLARY: 134 mg/dL — AB (ref 65–99)
GLUCOSE-CAPILLARY: 122 mg/dL — AB (ref 65–99)
Glucose-Capillary: 98 mg/dL (ref 65–99)
Glucose-Capillary: 137 mg/dL — ABNORMAL HIGH (ref 65–99)

## 2017-02-21 MED ORDER — TRAMADOL HCL 50 MG PO TABS: 50.0000 mg | ORAL_TABLET | Freq: Four times a day (QID) | ORAL | Status: DC | PRN

## 2017-02-21 MED ORDER — ACETAMINOPHEN 500 MG PO TABS
1000.0000 mg | ORAL_TABLET | Freq: Four times a day (QID) | ORAL | Status: DC | PRN
  Administered 2017-02-21: 1000 mg via ORAL
  Filled 2017-02-21: qty 2

## 2017-02-21 NOTE — Progress Notes (Signed)
Spouse took pt to bathroom without calling for RN, I was called by telesitter and instructed spouse to call me before getting pt up. Robet Crutchfield MyrickRN

## 2017-02-21 NOTE — Progress Notes (Signed)
Pt's spouse continues to get pt up and move him from chair to bed and back even after being told to call staff for assistance. Consuelo Pandy RN

## 2017-02-21 NOTE — Progress Notes (Signed)
6 Days Post-Op Procedure(s) (LRB): VIDEO ASSISTED THORACOSCOPY (Left) DRAINAGE OF PLEURAL EFFUSION (Left) LUNG BIOPSY (Left) Subjective: Mild incisional pain Had a much better night last night, did not require sedation  Objective: Vital signs in last 24 hours: Temp:  [97.4 F (36.3 C)-98.8 F (37.1 C)] 97.4 F (36.3 C) (03/18 0800) Pulse Rate:  [58-77] 64 (03/18 0800) Cardiac Rhythm: Sinus bradycardia (03/18 0748) Resp:  [17-25] 21 (03/18 0800) BP: (94-114)/(64-81) 114/76 (03/18 0800) SpO2:  [92 %-96 %] 96 % (03/18 0748)  Hemodynamic parameters for last 24 hours:    Intake/Output from previous day: 03/17 0701 - 03/18 0700 In: 1440 [P.O.:1440] Out: 1075 [Urine:1075] Intake/Output this shift: Total I/O In: 240 [P.O.:240] Out: -   General appearance: alert, cooperative and no distress Neurologic: intact Heart: regular rate and rhythm Lungs: clear to auscultation bilaterally Wound: clean and dry  Lab Results: No results for input(s): WBC, HGB, HCT, PLT in the last 72 hours. BMET: No results for input(s): NA, K, CL, CO2, GLUCOSE, BUN, CREATININE, CALCIUM in the last 72 hours.  PT/INR: No results for input(s): LABPROT, INR in the last 72 hours. ABG    Component Value Date/Time   PHART 7.412 02/16/2017 0455   HCO3 24.5 02/16/2017 0455   O2SAT 96.1 02/16/2017 0455   CBG (last 3)   Recent Labs  02/20/17 1701 02/20/17 2134 02/21/17 0857  GLUCAP 124* 115* 134*    Assessment/Plan: S/P Procedure(s) (LRB): VIDEO ASSISTED THORACOSCOPY (Left) DRAINAGE OF PLEURAL EFFUSION (Left) LUNG BIOPSY (Left) -  Looks much better this AM Did Ok with walk this AM, but still unsteady with turning- I think he needs one more day before DC CXR reviewed. He has a very small effusion, pneumo- will repeat CXR in AM   LOS: 6 days    Melrose Nakayama 02/21/2017

## 2017-02-22 ENCOUNTER — Inpatient Hospital Stay (HOSPITAL_COMMUNITY): Payer: PPO

## 2017-02-22 LAB — GLUCOSE, CAPILLARY
GLUCOSE-CAPILLARY: 115 mg/dL — AB (ref 65–99)
GLUCOSE-CAPILLARY: 109 mg/dL — AB (ref 65–99)

## 2017-02-22 MED ORDER — AMPICILLIN 500 MG PO CAPS: 500.0000 mg | ORAL_CAPSULE | Freq: Four times a day (QID) | ORAL | 0 refills | Status: AC

## 2017-02-22 MED ORDER — TRAMADOL HCL 50 MG PO TABS: 50.0000 mg | ORAL_TABLET | Freq: Two times a day (BID) | ORAL | 0 refills | Status: DC | PRN

## 2017-02-22 NOTE — Evaluation (Signed)
Physical Therapy Evaluation & discharge Patient Details Name: Dean Pruitt MRN: [id number] DOB: 19-Oct-1934 Today's Date: 02/22/2017   History of Present Illness  83 y/o s/p drainage of pleural effusion, VAT, lung bipsy, parietal pleurectomy (02/15/17) PMH includes arthritis, cataracts, hyperlipidemia  Clinical Impression  Pt scheduled for d/c this afternoon from hospital.  Recommend RW and 3-1 BSC, as well as HHPT.  Pt has flight of stairs to get to bedroom and would benefit from home safety eval.  Due to pending d/c, will d/c PT at this time.  If pt ends up not being d/c, please re-order PT. Thank you for the referral.    Follow Up Recommendations Home health PT;Supervision/Assistance - 24 hour    Equipment Recommendations  Rolling walker with 5" wheels;3in1 (PT)    Recommendations for Other Services       Precautions / Restrictions Precautions Precautions: Fall Restrictions Weight Bearing Restrictions: No      Mobility  Bed Mobility               General bed mobility comments: up in recliner  Transfers Overall transfer level: Needs assistance Equipment used: Rolling walker (2 wheeled) Transfers: Sit to/from Stand Sit to Stand: Supervision         General transfer comment: S for safety  Ambulation/Gait Ambulation/Gait assistance: Min guard Ambulation Distance (Feet): 120 Feet Assistive device: Rolling walker (2 wheeled) Gait Pattern/deviations: Step-through pattern     General Gait Details: Steady gait with RW. He is able to ambulate shorter distances without, but needs support for longer distance  Stairs Stairs: Yes Stairs assistance: Min guard Stair Management: One rail Right;One rail Left;Alternating pattern;Step to pattern;Forwards Number of Stairs: 10 General stair comments: Ascent with step through pattern and descent with step to pattern with close min/guard and cueing for safe technique and foot placement  Wheelchair Mobility     Modified Rankin (Stroke Patients Only)       Balance Overall balance assessment: Needs assistance   Sitting balance-Leahy Scale: Good       Standing balance-Leahy Scale: Fair                               Pertinent Vitals/Pain Pain Assessment: No/denies pain    Home Living Family/patient expects to be discharged to:: Private residence Living Arrangements: Spouse/significant other Available Help at Discharge: Available 24 hours/day;Family Type of Home: House Home Access: Stairs to enter Entrance Stairs-Rails: None Entrance Stairs-Number of Steps: 2 Home Layout: Two level Home Equipment: None      Prior Function Level of Independence: Independent               Hand Dominance        Extremity/Trunk Assessment   Upper Extremity Assessment Upper Extremity Assessment: Overall WFL for tasks assessed    Lower Extremity Assessment Lower Extremity Assessment: Overall WFL for tasks assessed       Communication   Communication: No difficulties  Cognition Arousal/Alertness: Awake/alert Behavior During Therapy: WFL for tasks assessed/performed Overall Cognitive Status: Impaired/Different from baseline Area of Impairment: Orientation;Awareness;Attention;Following commands Orientation Level: Disoriented to;Situation Current Attention Level: Focused   Following Commands: Follows one step commands with increased time;Follows one step commands consistently   Awareness: Intellectual        General Comments      Exercises     Assessment/Plan    PT Assessment All further PT needs can be met in the next venue of  care  PT Problem List Decreased strength;Decreased activity tolerance;Decreased balance;Decreased mobility       PT Treatment Interventions      PT Goals (Current goals can be found in the Care Plan section)  Acute Rehab PT Goals Patient Stated Goal: family's goal is to gt pt home in his own environment PT Goal Formulation: All  assessment and education complete, DC therapy    Frequency     Barriers to discharge Inaccessible home environment flight of stairs to bedroom and full bath    Co-evaluation               End of Session Equipment Utilized During Treatment: Gait belt Activity Tolerance: Patient tolerated treatment well Patient left: in chair;with call bell/phone within reach;with family/visitor present Nurse Communication: Mobility status PT Visit Diagnosis: Unsteadiness on feet (R26.81);Difficulty in walking, not elsewhere classified (R26.2)         Time: 7322-5672 PT Time Calculation (min) (ACUTE ONLY): 28 min   Charges:   PT Evaluation $PT Eval Moderate Complexity: 1 Procedure PT Treatments $Gait Training: 8-22 mins   PT G Codes:         Wofford Stratton LUBECK 02/22/2017, 1:39 PM

## 2017-02-22 NOTE — Progress Notes (Signed)
Patient to be discharged to home with spouse. Rolling walker delivered to bedside by [facility]. HHPT also arranged by unit CM. PIV x1 removed per protocol. Instructed/demonstrated to patient's spouse how to do dressing changes to surgical incisions. CCMD notified of patient's discharge. Discharge instructions, follow up appts, and medications reviewed. Spouse to provide transportation home.  Joellen Jersey, RN

## 2017-02-22 NOTE — Progress Notes (Addendum)
7 Days Post-Op Procedure(s) (LRB): VIDEO ASSISTED THORACOSCOPY (Left) DRAINAGE OF PLEURAL EFFUSION (Left) LUNG BIOPSY (Left) Subjective: Didn't sleep well last night Wife says he got OOB around 2 AM and didn't go back to sleep He is cooperative this morning  Objective: Vital signs in last 24 hours: Temp:  [97.6 F (36.4 C)-98.2 F (36.8 C)] 97.7 F (36.5 C) (03/19 0738) Pulse Rate:  [62-73] 63 (03/19 0738) Cardiac Rhythm: Normal sinus rhythm (03/19 0749) Resp:  [16-24] 17 (03/19 0738) BP: (96-122)/(60-84) 105/80 (03/19 0738) SpO2:  [91 %-96 %] 91 % (03/19 0738)  Hemodynamic parameters for last 24 hours:    Intake/Output from previous day: 03/18 0701 - 03/19 0700 In: 1200 [P.O.:1200] Out: 600 [Urine:600] Intake/Output this shift: Total I/O In: 360 [P.O.:360] Out: -   General appearance: alert, cooperative and no distress Neurologic: no focal weakness Heart: regular rate and rhythm Lungs: clear to auscultation bilaterally Abdomen: normal findings: soft, non-tender  Lab Results: No results for input(s): WBC, HGB, HCT, PLT in the last 72 hours. BMET: No results for input(s): NA, K, CL, CO2, GLUCOSE, BUN, CREATININE, CALCIUM in the last 72 hours.  PT/INR: No results for input(s): LABPROT, INR in the last 72 hours. ABG    Component Value Date/Time   PHART 7.412 02/16/2017 0455   HCO3 24.5 02/16/2017 0455   O2SAT 96.1 02/16/2017 0455   CBG (last 3)   Recent Labs  02/21/17 1722 02/21/17 2136 02/22/17 0737  GLUCAP 122* 137* 115*    Assessment/Plan: S/P Procedure(s) (LRB): VIDEO ASSISTED THORACOSCOPY (Left) DRAINAGE OF PLEURAL EFFUSION (Left) LUNG BIOPSY (Left) -POD # 6 pleurectomy for mesothelioma complicated by ethanol withdrawal and delirium Looks well Ambulated 3 x yesterday Has good breath sounds bilaterally- CXR hasn't been done yet Check CXR this AM- if Ok will dc home today Confusion- combination of withdrawal and sundowning- hopefully will improve  in home environment  LOS: 7 days    Melrose Nakayama 02/22/2017 CXR stable- dc home  Remo Lipps C. Roxan Hockey, MD [facility] [phone]

## 2017-02-22 NOTE — Care Management Important Message (Signed)
Important Message  Patient Details  Name: Dean Pruitt MRN: [id number] Date of Birth: [birth year]   Medicare Important Message Given:  Yes    Raea Magallon Abena 02/22/2017, 1:00 PM

## 2017-02-22 NOTE — Care Management Note (Signed)
Case Management Note  Patient Details  Name: Dean Pruitt MRN: [id number] Date of Birth: 05-06-1934  Subjective/Objective:      POD 1 VATS, drainage of pl effusion, lung bx with chest tube to suction, per MD will dc pca and transition to po. Patient lives with his wife, [name], she is great support for him at home.  He has medication coverage and a PCP.  Per Anne,if they need [place] services they would like [facility].   NCM will cont to follow for dc needs.    3/19 [place], BSN - patient is for dc today if, cxr ok, await pt/ot eval also.  Per RN, patient is unsteady will prob need rolling walker.  NCM will cont to follow for dc needs.    Per pt eval rec HHPT and rolling walker, NCM made referral to [facility] with [facility] . He will bring rolling walker to room.  Per previous note wife wants to work with [facility].  Soc will begin 24-48 hrs post dc.                        Action/Plan:   Expected Discharge Date:  02/22/17               Expected Discharge Plan:  [place]  In-House Referral:     Discharge planning Services  CM Consult  Post Acute Care Choice:  Durable Medical Equipment, Home Health Choice offered to:  Spouse  DME Arranged:  [facility] Rile rolling DME Agency:  [place] Arranged:  PT [facility] Agency:  [place]  Status of Service:  Completed, signed off  If discussed at [place] of Stay Meetings, dates discussed:    Additional Comments:  Zenon Mayo, RN 02/22/2017, 2:19 PM

## 2017-02-22 NOTE — Plan of Care (Signed)
Problem: Health Behavior/Discharge Planning: Goal: Ability to manage health-related needs will improve Outcome: Adequate for Discharge Patient will be discharged today with home health arranged by CM.

## 2017-02-25 ENCOUNTER — Ambulatory Visit (INDEPENDENT_AMBULATORY_CARE_PROVIDER_SITE_OTHER): Payer: PPO | Admitting: *Deleted

## 2017-02-25 DIAGNOSIS — N3941 Urge incontinence: Secondary | ICD-10-CM | POA: Diagnosis not present

## 2017-02-25 DIAGNOSIS — E538 Deficiency of other specified B group vitamins: Secondary | ICD-10-CM | POA: Diagnosis not present

## 2017-02-25 DIAGNOSIS — R350 Frequency of micturition: Secondary | ICD-10-CM | POA: Diagnosis not present

## 2017-02-25 MED ORDER — CYANOCOBALAMIN 1000 MCG/ML IJ SOLN
1000.0000 ug | Freq: Once | INTRAMUSCULAR | Status: AC
  Administered 2017-02-25: 1000 ug via INTRAMUSCULAR

## 2017-03-01 ENCOUNTER — Other Ambulatory Visit: Payer: Self-pay | Admitting: Thoracic Surgery (Cardiothoracic Vascular Surgery)

## 2017-03-01 ENCOUNTER — Other Ambulatory Visit: Payer: Self-pay | Admitting: *Deleted

## 2017-03-01 ENCOUNTER — Ambulatory Visit
Admission: RE | Admit: 2017-03-01 | Discharge: 2017-03-01 | Disposition: A | Discharge status: Home / Self Care | Payer: PPO | Source: Ambulatory Visit | Attending: Thoracic Surgery (Cardiothoracic Vascular Surgery) | Admitting: Thoracic Surgery (Cardiothoracic Vascular Surgery)

## 2017-03-01 ENCOUNTER — Ambulatory Visit (INDEPENDENT_AMBULATORY_CARE_PROVIDER_SITE_OTHER): Payer: PPO | Admitting: Physician Assistant

## 2017-03-01 DIAGNOSIS — J90 Pleural effusion, not elsewhere classified: Secondary | ICD-10-CM

## 2017-03-01 NOTE — Progress Notes (Signed)
[name]       [place] [address]             [phone]      Dean Pruitt is a 83 y.o. male patient who presented for a suture removal appointment.   No diagnosis found. Past Medical History:  Diagnosis Date  . Cataract   . Depression    situational  . GERD (gastroesophageal reflux disease)   . Hyperlipidemia   . Hypothyroidism   . Thyroid cancer [facility])    PMH of; on supressive therapy   No past surgical history pertinent negatives on file. Scheduled Meds: . cyanocobalamin  1,000 mcg Intramuscular Once   Current Outpatient Prescriptions on File Prior to Visit  Medication Sig Dispense Refill  . ampicillin (PRINCIPEN) 500 MG capsule Take 1 capsule (500 mg total) by mouth every 6 (six) hours. 32 capsule 0  . aspirin 81 MG tablet Take 81 mg by mouth daily.    Marland Kitchen escitalopram (LEXAPRO) 10 MG tablet Take 1 tablet (10 mg total) by mouth at bedtime. 90 tablet 1  . gabapentin (NEURONTIN) 300 MG capsule nightly (Patient taking differently: Take 300 mg by mouth at bedtime. nightly) 30 capsule 3  . levothyroxine (SYNTHROID, LEVOTHROID) 150 MCG tablet TAKE 1 TABLET (150 mcg) BY MOUTH 3 days a week, 75 mcg 4 days a week (Patient taking differently: Take 75-150 mcg by mouth See admin instructions. 150 mcg every day except Tuesday and Thursdays and takes 75 mcg (1/2 tablet)) 45 tablet 1  . omeprazole (PRILOSEC) 20 MG capsule Take 20 mg by mouth every other day.    . Probiotic Product (PROBIOTIC PO) Take 2 tablets by mouth 2 (two) times daily.    . rosuvastatin (CRESTOR) 20 MG tablet Take 1 tablet (20 mg total) by mouth daily. (Patient taking differently: Take 20 mg by mouth every other day. ) 90 tablet 3  . traMADol (ULTRAM) 50 MG tablet Take 1 tablet (50 mg total) by mouth every 12 (twelve) hours as needed for moderate pain. 28 tablet 0   Current Facility-Administered Medications on File Prior to Visit  Medication Dose Route Frequency Provider Last Rate Last  Dose  . cyanocobalamin ((VITAMIN B-12)) injection 1,000 mcg  1,000 mcg Intramuscular Once Pieter Partridge, DO        No Known Allergies Active Problems:   * No active hospital problems. *  There were no vitals taken for this visit.  Subjective: The patient is having some pain at his incision site which is preventing him from taking deep breaths. He feels his lung has filled up with fluid again.  Objective  Pulm-CTA bilaterally, diminished in the lower lobes Extremities-no edema Incision-clean and dry without drainage  CLINICAL DATA:  Pleural effusion.  Status post left-sided VA TLS.  EXAM: CHEST  2 VIEW  COMPARISON:  02/22/2017  FINDINGS: Two views study shows interval resolution of the left lung air-fluid level seen previously. There is persistent left pleural effusion with a probable posterior loculated component best seen on the lateral projection. Left base atelectatic change is stable. Right lung is clear. The cardiopericardial silhouette is within normal limits for size. The visualized bony structures of the thorax are intact.  IMPRESSION: Air-fluid level seen previously has resolved in the interval with slight interval decrease and left pleural fluid.  Lateral film suggests a residual loculated component to the fluid collection posteriorly.  Persistent atelectasis at the left base.   Electronically Signed  By: Dean Pruitt M.D.   On: 03/01/2017 14:15  Assessment & Plan  [name] presented today for a suture removal appointment. He was having some left-sided pain surrounding his incision which was inhibiting him taking a deep breath. He was fearful that his lung space was filling up with fluid. After auscultation, he did have some diminished breath sounds in the bases bilaterally therefore I sent him for a chest x-ray. Upon review of the chest x-ray he did have some small bilateral pleural effusions and a loculated air-filled pocket on the left. This  image did not appear drastically different from the image on 02/22/2017. The fluid level was certainly not something that required intervention. I explained this to the patient and he understood. I encouraged him to continue walking several times a day and to use an incentive spirometer to help deepen his breath. At this time his wife mentioned that they did not go home with an incentive spirometer, therefore I wrote a prescription for one to be picked up at the medical supply store. Unfortunately, we do not carry incentive spirometer in our office. He is to return next week for his follow-up appointment with Dr. Roxan Hockey. I do still want him to receive a follow-up chest x-ray at this time. All questions were answered to his and his wife's satisfaction. He is encouraged to call our office if he has any other questions or concerns.  Dean Pruitt 03/01/2017

## 2017-03-01 NOTE — Patient Instructions (Signed)
Please return next week for your follow-up appointment with Dr. Roxan Hockey  Increase activity as tolerated and use your incentive spirometer

## 2017-03-02 DIAGNOSIS — R350 Frequency of micturition: Secondary | ICD-10-CM | POA: Diagnosis not present

## 2017-03-02 DIAGNOSIS — N3941 Urge incontinence: Secondary | ICD-10-CM | POA: Diagnosis not present

## 2017-03-09 ENCOUNTER — Encounter: Payer: Self-pay | Admitting: Thoracic Surgery (Cardiothoracic Vascular Surgery)

## 2017-03-09 ENCOUNTER — Other Ambulatory Visit: Payer: Self-pay | Admitting: *Deleted

## 2017-03-09 ENCOUNTER — Ambulatory Visit (INDEPENDENT_AMBULATORY_CARE_PROVIDER_SITE_OTHER): Payer: Self-pay | Admitting: Thoracic Surgery (Cardiothoracic Vascular Surgery)

## 2017-03-09 ENCOUNTER — Ambulatory Visit
Admission: RE | Admit: 2017-03-09 | Discharge: 2017-03-09 | Disposition: A | Discharge status: Home / Self Care | Payer: PPO | Source: Ambulatory Visit | Attending: Thoracic Surgery (Cardiothoracic Vascular Surgery) | Admitting: Thoracic Surgery (Cardiothoracic Vascular Surgery)

## 2017-03-09 VITALS — BP 104/69 | HR 63 | Resp 16 | Ht 71.0 in | Wt 185.0 lb

## 2017-03-09 DIAGNOSIS — C450 Mesothelioma of pleura: Secondary | ICD-10-CM

## 2017-03-09 DIAGNOSIS — J90 Pleural effusion, not elsewhere classified: Secondary | ICD-10-CM

## 2017-03-09 DIAGNOSIS — C459 Mesothelioma, unspecified: Secondary | ICD-10-CM

## 2017-03-09 DIAGNOSIS — Z09 Encounter for follow-up examination after completed treatment for conditions other than malignant neoplasm: Secondary | ICD-10-CM

## 2017-03-09 NOTE — Progress Notes (Addendum)
[address]       [place] [address]             [phone]    HPI: Mr. Tennell returns for a scheduled postoperative follow-up visit.  Mr. Standish is an 83 year old gentleman who presented with a recurrent left pleural effusion. He first diagnosis around Thanksgiving. He had multiple large volume thoracenteses. Cytologies were negative.  I did a left VATS and parietal pleurectomy on 02/15/2017. His postoperative course was, complicated by delirium. He was discharged on postoperative day 7. Pathology showed malignant mesothelioma.  He is feeling well. His wife says his mental status is back to his baseline. He took tramadol one time but that caused hallucinations and he has not taken any pain medication. He denies pain and is not even taking Tylenol. He denies any shortness of breath. Past Medical History:  Diagnosis Date  . Cataract   . Depression    situational  . GERD (gastroesophageal reflux disease)   . Hyperlipidemia   . Hypothyroidism   . Thyroid cancer [facility])    PMH of; on supressive therapy    Current Outpatient Prescriptions  Medication Sig Dispense Refill  . aspirin 81 MG tablet Take 81 mg by mouth daily.    Marland Kitchen escitalopram (LEXAPRO) 10 MG tablet Take 1 tablet (10 mg total) by mouth at bedtime. 90 tablet 1  . gabapentin (NEURONTIN) 300 MG capsule nightly (Patient taking differently: Take 300 mg by mouth at bedtime. nightly) 30 capsule 3  . levothyroxine (SYNTHROID, LEVOTHROID) 150 MCG tablet TAKE 1 TABLET (150 mcg) BY MOUTH 3 days a week, 75 mcg 4 days a week (Patient taking differently: Take 75-150 mcg by mouth See admin instructions. 150 mcg every day except Tuesday and Thursdays and takes 75 mcg (1/2 tablet)) 45 tablet 1  . omeprazole (PRILOSEC) 20 MG capsule Take 20 mg by mouth every other day.    . Probiotic Product (PROBIOTIC PO) Take 1 tablet by mouth daily.     . rosuvastatin (CRESTOR) 20 MG tablet Take 1 tablet (20 mg total) by mouth daily.  (Patient taking differently: Take 20 mg by mouth every other day. ) 90 tablet 3   Current Facility-Administered Medications  Medication Dose Route Frequency Provider Last Rate Last Dose  . cyanocobalamin ((VITAMIN B-12)) injection 1,000 mcg  1,000 mcg Intramuscular Once Pieter Partridge, DO        Physical Exam BP 104/69 (BP Location: Right Arm, Patient Position: Sitting, Cuff Size: Large)   Pulse 63   Resp 16   Ht '5\' 11"'$  (1.803 m)   Wt 185 lb (83.9 kg)   SpO2 98% Comment: ON RA  BMI 25.79 kg/m  83 year old man in no acute distress Alert and oriented 3 with no focal deficits Lungs clear with equal breath as bilaterally Incisions healing well No peripheral edema  Diagnostic Tests: CHEST  2 VIEW  COMPARISON:  03/01/2017  FINDINGS: Loculated fluid along the left fissure, increased.  Mild subpleural reticulation/ scarring along the lateral left hemithorax.  No pneumothorax.  The heart is top-normal in size.  Prominent epicardial fat.  Surgical clips overlying the bilateral neck.  IMPRESSION: Small loculated left pleural effusion along the left fissure, increased.   Electronically Signed   By: Julian Hy M.D.   On: 03/09/2017 13:29 I personally reviewed the chest x-ray and concur with the findings noted above  Impression: 83 year old man who presented with a left pleural effusion and was found to have  malignant mesothelioma. He is recovering well from his thoracoscopic parietal pleurectomy.  There are no restrictions on his physical activities. He needs to wait at least another 2 weeks before attempting to drive. Appropriate precautions were discussed.  He needs a PET/CT for staging.  He needs an oncology consultation for treatment.  Plan: PET/CT Referred to [place] see Dr. Julien Nordmann regarding mesothelioma I will see him back in a month with a chest x-ray to check on his progress.  Melrose Nakayama, MD [facility] [phone]

## 2017-03-10 ENCOUNTER — Telehealth: Payer: Self-pay | Admitting: *Deleted

## 2017-03-10 DIAGNOSIS — C450 Mesothelioma of pleura: Secondary | ICD-10-CM

## 2017-03-10 NOTE — Telephone Encounter (Signed)
Oncology Nurse Navigator Documentation  Oncology Nurse Navigator Flowsheets 03/10/2017  Navigator Location [facility]  Referral date to RadOnc/MedOnc 03/09/2017  Navigator Encounter Type Telephone/I received referral on Dean Pruitt yesterday.  I updated Dr. Julien Nordmann. I called today to schedule on 03/19/17 labs at 9:00 and to see Dr. Julien Nordmann at 9:30.    Telephone Outgoing Call  Treatment Phase Pre-Tx/Tx Discussion  Barriers/Navigation Needs Coordination of Care  Interventions Coordination of Care  Coordination of Care Appts  Acuity Level 2  Acuity Level 2 Assistance expediting appointments  Time Spent with Patient 30

## 2017-03-11 DIAGNOSIS — R350 Frequency of micturition: Secondary | ICD-10-CM | POA: Diagnosis not present

## 2017-03-11 DIAGNOSIS — N3941 Urge incontinence: Secondary | ICD-10-CM | POA: Diagnosis not present

## 2017-03-15 ENCOUNTER — Other Ambulatory Visit: Payer: Self-pay | Admitting: Family Medicine

## 2017-03-17 ENCOUNTER — Encounter (HOSPITAL_COMMUNITY)
Admission: RE | Admit: 2017-03-17 | Discharge: 2017-03-17 | Disposition: A | Discharge status: Home / Self Care | Payer: PPO | Source: Ambulatory Visit | Attending: Thoracic Surgery (Cardiothoracic Vascular Surgery) | Admitting: Thoracic Surgery (Cardiothoracic Vascular Surgery)

## 2017-03-17 DIAGNOSIS — C459 Mesothelioma, unspecified: Secondary | ICD-10-CM | POA: Diagnosis not present

## 2017-03-17 LAB — FUNGUS CULTURE WITH STAIN

## 2017-03-17 LAB — FUNGAL ORGANISM REFLEX

## 2017-03-17 LAB — FUNGUS CULTURE RESULT

## 2017-03-17 LAB — GLUCOSE, CAPILLARY: Glucose-Capillary: 109 mg/dL — ABNORMAL HIGH (ref 65–99)

## 2017-03-17 MED ORDER — FLUDEOXYGLUCOSE F - 18 (FDG) INJECTION: 9.3000 | Freq: Once | INTRAVENOUS | Status: DC | PRN

## 2017-03-19 ENCOUNTER — Telehealth: Payer: Self-pay | Admitting: Internal Medicine

## 2017-03-19 ENCOUNTER — Ambulatory Visit (HOSPITAL_BASED_OUTPATIENT_CLINIC_OR_DEPARTMENT_OTHER): Payer: PPO | Admitting: Internal Medicine

## 2017-03-19 ENCOUNTER — Encounter: Payer: Self-pay | Admitting: Internal Medicine

## 2017-03-19 ENCOUNTER — Telehealth: Payer: Self-pay | Admitting: *Deleted

## 2017-03-19 ENCOUNTER — Other Ambulatory Visit (HOSPITAL_BASED_OUTPATIENT_CLINIC_OR_DEPARTMENT_OTHER): Payer: PPO

## 2017-03-19 VITALS — BP 126/82 | HR 63 | Temp 98.5°F | Resp 18 | Wt 198.3 lb

## 2017-03-19 DIAGNOSIS — F329 Major depressive disorder, single episode, unspecified: Secondary | ICD-10-CM | POA: Diagnosis not present

## 2017-03-19 DIAGNOSIS — Z7189 Other specified counseling: Secondary | ICD-10-CM

## 2017-03-19 DIAGNOSIS — C450 Mesothelioma of pleura: Secondary | ICD-10-CM

## 2017-03-19 DIAGNOSIS — Z5111 Encounter for antineoplastic chemotherapy: Secondary | ICD-10-CM

## 2017-03-19 LAB — CBC WITH DIFFERENTIAL/PLATELET
BASO%: 1 % (ref 0.0–2.0)
Basophils Absolute: 0.1 10*3/uL (ref 0.0–0.1)
EOS ABS: 0.2 10*3/uL (ref 0.0–0.5)
EOS%: 2.8 % (ref 0.0–7.0)
HEMATOCRIT: 44.4 % (ref 38.4–49.9)
HGB: 14.9 g/dL (ref 13.0–17.1)
LYMPH#: 1.8 10*3/uL (ref 0.9–3.3)
LYMPH%: 26.2 % (ref 14.0–49.0)
MCH: 32 pg (ref 27.2–33.4)
MCHC: 33.5 g/dL (ref 32.0–36.0)
MCV: 95.3 fL (ref 79.3–98.0)
MONO#: 0.6 10*3/uL (ref 0.1–0.9)
MONO%: 9.6 % (ref 0.0–14.0)
NEUT%: 60.4 % (ref 39.0–75.0)
NEUTROS ABS: 4 10*3/uL (ref 1.5–6.5)
PLATELETS: 246 10*3/uL (ref 140–400)
RBC: 4.66 10*6/uL (ref 4.20–5.82)
RDW: 13.9 % (ref 11.0–14.6)
WBC: 6.7 10*3/uL (ref 4.0–10.3)

## 2017-03-19 LAB — COMPREHENSIVE METABOLIC PANEL
ALT: 15 U/L (ref 0–55)
ANION GAP: 9 meq/L (ref 3–11)
AST: 21 U/L (ref 5–34)
Albumin: 3.5 g/dL (ref 3.5–5.0)
Alkaline Phosphatase: 82 U/L (ref 40–150)
BILIRUBIN TOTAL: 0.5 mg/dL (ref 0.20–1.20)
BUN: 22.6 mg/dL (ref 7.0–26.0)
CALCIUM: 9 mg/dL (ref 8.4–10.4)
CO2: 24 mEq/L (ref 22–29)
CREATININE: 1.1 mg/dL (ref 0.7–1.3)
Chloride: 108 mEq/L (ref 98–109)
EGFR: 64 mL/min/{1.73_m2} — AB (ref >90)
Glucose: 104 mg/dl (ref 70–140)
Potassium: 4.5 mEq/L (ref 3.5–5.1)
Sodium: 140 mEq/L (ref 136–145)
TOTAL PROTEIN: 6.6 g/dL (ref 6.4–8.3)

## 2017-03-19 MED ORDER — FOLIC ACID 1 MG PO TABS
1.0000 mg | ORAL_TABLET | Freq: Every day | ORAL | 4 refills | Status: DC
Start: 2017-03-19 — End: 2017-08-25

## 2017-03-19 MED ORDER — DEXAMETHASONE 4 MG PO TABS: ORAL_TABLET | ORAL | 1 refills | Status: DC

## 2017-03-19 MED ORDER — PROCHLORPERAZINE MALEATE 10 MG PO TABS: 10.0000 mg | ORAL_TABLET | Freq: Four times a day (QID) | ORAL | 0 refills | Status: DC | PRN

## 2017-03-19 MED ORDER — LIDOCAINE-PRILOCAINE 2.5-2.5 % EX CREA: 1.0000 "application " | TOPICAL_CREAM | CUTANEOUS | 0 refills | Status: DC | PRN

## 2017-03-19 NOTE — Telephone Encounter (Signed)
Scheduled appts per 4/13 los. New start per MM okay to push everything back to the next week due to the availability in the treatment area.

## 2017-03-19 NOTE — Telephone Encounter (Signed)
Oncology Nurse Navigator Documentation  Oncology Nurse Navigator Flowsheets 03/19/2017  Navigator Location [facility]  Referral date to RadOnc/MedOnc -  Navigator Encounter Type Telephone;Clinic/MDC/I spoke with patient and his wife today at [facility].  I updated them on next steps.  I also contacted scheduling at [place] office to get his port scheduled.   Later today, I received a notification from [place] that patient's wife was calling about the port.  I in-basket the nurse and called and left vm message that yes, patient needs a port.  I called Mr and Ms Esson to updated them.  Ms. Zapanta was crying and stated there grandson had passed away.  I listened as she explained.  I sent my condolences and asked to call me on Monday if there was something we could do for them.  I will updated Dr. Julien Nordmann and Dr. Roxan Hockey and team.  I will also reach out to chaplin to see if she can contact them at some point.   Telephone Outgoing Call  Patient Visit Type MedOnc  Treatment Phase Pre-Tx/Tx Discussion  Barriers/Navigation Needs Coordination of Care;Education  Education Other  Interventions Education  Coordination of Care Appts  Education Method Verbal  Acuity Level 2  Time Spent with Patient 71

## 2017-03-19 NOTE — Progress Notes (Signed)
START ON PATHWAY REGIMEN - Mesothelioma     A cycle is every 21 days:     Pemetrexed      Carboplatin   **Always confirm dose/schedule in your pharmacy ordering system**    Patient Characteristics: First Line AJCC M Category: M0 AJCC 8 Stage Grouping: II Current evidence of distant metastases? No AJCC T Category: T1 AJCC N Category: N1 Line of therapy: First Line Would you be surprised if this patient died  in the next year? I would NOT be surprised if this patient died in the next year  Intent of Therapy: Non-Curative / Palliative Intent, Discussed with Patient

## 2017-03-19 NOTE — Progress Notes (Signed)
[place] [phone]   [phone]  CONSULT NOTE  REFERRING PHYSICIAN: Dr. Modesto Charon.  REASON FOR CONSULTATION:  83 years old white male recently diagnosed with malignant pleural mesothelioma.  HPI Dean Pruitt is a 83 y.o. male with past medical history significant for macular degeneration, dyslipidemia, left foot drop as well as history of papillary thyroid carcinoma status post resection in 2001. The patient has no clear history for asbestos exposure. In November 2017 he started complaining of tightness in his chest. He was found on routine ultrasound of the abdomen for evaluation of abdominal aortic aneurysm to have moderately large left pleural effusion. CT scan of the chest with contrast was performed 10/21/2016 and showed moderate left pleural effusion and left atelectasis. There was also incidental finding of left pleural based nodule measuring 1.0 cm. The scan also showed a small mediastinal lymph nodes including precarinal node measuring 1.9 x 2.9 cm and left hilar node measuring 1.6 x 0.6 cm. He was seen by Dr. Melvyn Novas and on 11/03/2016 he underwent ultrasound-guided left thoracentesis with drainage of 1.6 L of pleural fluid. The cytology showed atypical cells with no malignancy identified. He also underwent another ultrasound-guided left thoracentesis on 12/08/2016 with drainage of 1.8 L of pleural fluid and again the final cytology showed atypical cells. A third ultrasound guided thoracentesis was performed on 02/01/2017 with drainage of 1.7 L of yellow fluid. The patient was then referred to Dr. Roxan Hockey and repeat CT scan of the chest on 02/12/2017 showed recurrent moderate-sized left pleural effusion with no mediastinal or hilar mass or adenopathy. There was no worrisome lung lesions. On 02/15/2017 the patient underwent left VATS with drainage of the left pleural effusion as well as left lung biopsy and parietal pleurectomy under  the care of Dr. Roxan Hockey. The final pathology [phone]) of the left parietal pleura as well as left lung nodule was consistent with epithelioid mesothelioma. There was 1 mm focus of metastatic papillary thyroid cancer and the wedge resection of the left lower lobe. A PET scan on 03/17/2017 showed hypermetabolic activity along the left pleural service and this could certainly be from recent inflammation after the surgical excision. There is also some focal nodularity along the upper pericardial margin which is hypermetabolic and suspicious for mesothelioma. There is also an upper mediastinal lymph node just below the thoracic inlet which is hypermetabolic compatible with malignant involvement. There was no involvement of the abdomen, pelvis, skeleton or neck. Dr. Roxan Hockey kindly referred the patient to me today for evaluation and recommendation regarding treatment of his condition. When seen today the patient is feeling fine with no specific complaints. He denied having any chest pain, shortness breath, cough or hemoptysis. He denied having any significant weight loss or night sweats. He has no nausea, vomiting but has occasional diarrhea with no constipation. He has no headache or visual changes. Family history significant for a mother with Alzheimer, father had bladder cancer and brother had colon cancer. The patient is married and has 2 biologic children and 2 step children. He was accompanied by his wife [name]. 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 He has a history of smoking 1 pack per day for around 10 years but quit 50 years ago. He also drank 2-3 glasses of wine every night. He has no history of drug abuse.   HPI  Past Medical History:  Diagnosis Date  . Cataract   . Depression    situational  . GERD (gastroesophageal reflux disease)   . Hyperlipidemia   . Hypothyroidism   . Thyroid cancer  ([place])    PMH of; on supressive therapy    Past Surgical History:  Procedure Laterality Date  . CATARACT EXTRACTION, BILATERAL  2013 & 2014   Dr Celene Squibb  . colon polyps  2002 & 2005   negative 2008; Dr Earlean Shawl  . COLONOSCOPY W/ POLYPECTOMY    . LUNG BIOPSY Left 02/15/2017   Procedure: LUNG BIOPSY;  Surgeon: Melrose Nakayama, MD;  Location: [facility];  Service: Thoracic;  Laterality: Left;  . PLEURAL EFFUSION DRAINAGE Left 02/15/2017   Procedure: DRAINAGE OF PLEURAL EFFUSION;  Surgeon: Melrose Nakayama, MD;  Location: [facility];  Service: Thoracic;  Laterality: Left;  . THYROIDECTOMY  2001   S/P RAI  . VIDEO ASSISTED THORACOSCOPY Left 02/15/2017   Procedure: VIDEO ASSISTED THORACOSCOPY;  Surgeon: Melrose Nakayama, MD;  Location: [facility];  Service: Thoracic;  Laterality: Left;    Family History  Problem Relation Age of Onset  . Alzheimer's disease Mother   . Hypertension Mother   . Heart disease Father     CAD and angioplasty in 13s  . Cancer Father     Bladder  . Other Brother     valvular heart disease  . Diabetes Neg Hx   . Stroke Neg Hx     Social History Social History  Substance Use Topics  . Smoking status: Former Smoker    Packs/day: 1.00    Years: 5.00    Types: Cigarettes    Quit date: 12/07/1958  . Smokeless tobacco: Never Used     Comment: smoked 1956-1960, up 1 ppd  . Alcohol use 8.4 oz/week    14 Glasses of wine per week    No Known Allergies  Current Outpatient Prescriptions  Medication Sig Dispense Refill  . aspirin 81 MG tablet Take 81 mg by mouth daily.    . diphenhydrAMINE (BENADRYL) 25 MG tablet Take 50 mg by mouth at bedtime as needed for sleep.    Marland Kitchen escitalopram (LEXAPRO) 10 MG tablet Take 1 tablet (10 mg total) by mouth at bedtime. 90 tablet 1  . gabapentin (NEURONTIN) 300 MG capsule TAKE ONE CAPSULE BY MOUTH AT NIGHT 30 capsule 3  . levothyroxine (SYNTHROID, LEVOTHROID) 150 MCG tablet TAKE 1 TABLET (150 mcg) BY MOUTH 3 days a week, 75 mcg 4  days a week (Patient taking differently: Take 75-150 mcg by mouth See admin instructions. 150 mcg every day except Tuesday and Thursdays and takes 75 mcg (1/2 tablet)) 45 tablet 1  . omeprazole (PRILOSEC) 20 MG capsule Take 20 mg by mouth every other day.    . Probiotic Product (PROBIOTIC PO) Take 1 tablet by mouth daily.     . rosuvastatin (CRESTOR) 20 MG tablet Take 1 tablet (20 mg total) by mouth daily. (Patient taking differently: Take 20 mg by mouth every other day. ) 90 tablet 3  . meloxicam (MOBIC) 7.5 MG tablet Take 7.5 mg by mouth daily.     Current Facility-Administered Medications  Medication Dose Route Frequency Provider Last Rate Last Dose  . cyanocobalamin ((VITAMIN B-12)) injection 1,000 mcg  1,000 mcg Intramuscular Once Pieter Partridge, DO       Facility-Administered Medications Ordered in Other Visits  Medication Dose Route Frequency Provider Last Rate Last Dose  . fludeoxyglucose F - 18 (FDG) injection 9.3 millicurie  9.3 millicurie Intravenous Once PRN Lorin Picket, MD        Review of Systems  Constitutional: negative Eyes: negative Ears, nose, mouth, throat, and face: negative Respiratory: negative Cardiovascular: negative Gastrointestinal: negative Genitourinary:negative Integument/breast: negative Hematologic/lymphatic: negative Musculoskeletal:negative Neurological: negative Behavioral/Psych: negative Endocrine: negative Allergic/Immunologic: negative  Physical Exam  ZOX:WRUEA, healthy, no distress, well nourished, well developed and anxious SKIN: skin color, texture, turgor are normal, no rashes or significant lesions HEAD: Normocephalic, No masses, lesions, tenderness or abnormalities EYES: normal, PERRLA, Conjunctiva are pink and non-injected EARS: External ears normal, Canals clear OROPHARYNX:no exudate, no erythema and lips, buccal mucosa, and tongue normal  NECK: supple, no adenopathy, no JVD LYMPH:  no palpable lymphadenopathy, no  hepatosplenomegaly LUNGS: clear to auscultation , and palpation HEART: regular rate & rhythm, no murmurs and no gallops ABDOMEN:abdomen soft, non-tender, normal bowel sounds and no masses or organomegaly BACK: Back symmetric, no curvature., No CVA tenderness EXTREMITIES:no joint deformities, effusion, or inflammation, no edema, no skin discoloration  NEURO: alert & oriented x 3 with fluent speech, no focal motor/sensory deficits  PERFORMANCE STATUS: ECOG 1  LABORATORY DATA: Lab Results  Component Value Date   WBC 6.7 03/19/2017   HGB 14.9 03/19/2017   HCT 44.4 03/19/2017   MCV 95.3 03/19/2017   PLT 246 03/19/2017      Chemistry      Component Value Date/Time   NA 140 03/19/2017 0909   K 4.5 03/19/2017 0909   CL 101 02/17/2017 0518   CO2 24 03/19/2017 0909   BUN 22.6 03/19/2017 0909   CREATININE 1.1 03/19/2017 0909      Component Value Date/Time   CALCIUM 9.0 03/19/2017 0909   ALKPHOS 82 03/19/2017 0909   AST 21 03/19/2017 0909   ALT 15 03/19/2017 0909   BILITOT 0.50 03/19/2017 0909       RADIOGRAPHIC STUDIES: Dg Chest 2 View  Result Date: 03/09/2017 CLINICAL DATA:  Pleural effusion, status post left VATS on 02/15/2017 EXAM: CHEST  2 VIEW COMPARISON:  03/01/2017 FINDINGS: Loculated fluid along the left fissure, increased. Mild subpleural reticulation/ scarring along the lateral left hemithorax. No pneumothorax. The heart is top-normal in size.  Prominent epicardial fat. Surgical clips overlying the bilateral neck. IMPRESSION: Small loculated left pleural effusion along the left fissure, increased. Electronically Signed   By: Julian Hy M.D.   On: 03/09/2017 13:29   Dg Chest 2 View  Result Date: 03/01/2017 CLINICAL DATA:  Pleural effusion.  Status post left-sided VA TLS. EXAM: CHEST  2 VIEW COMPARISON:  02/22/2017 FINDINGS: Two views study shows interval resolution of the left lung air-fluid level seen previously. There is persistent left pleural effusion with a  probable posterior loculated component best seen on the lateral projection. Left base atelectatic change is stable. Right lung is clear. The cardiopericardial silhouette is within normal limits for size. The visualized bony structures of the thorax are intact. IMPRESSION: Air-fluid level seen previously has resolved in the interval with slight interval decrease and left pleural fluid. Lateral film suggests a residual loculated component to the fluid collection posteriorly. Persistent atelectasis at the left base. Electronically Signed   By: Misty Stanley M.D.   On: 03/01/2017 14:15   Dg Chest 2 View  Result Date: 02/22/2017 CLINICAL DATA:  83 year old male with mesothelioma. Post surgery. Subsequent encounter. EXAM: CHEST  2 VIEW COMPARISON:  02/21/2017. FINDINGS: Similar appearance of left hydropneumothorax. Left base subsegmental atelectasis. Slight decrease in amount of left lower chest wall subcutaneous emphysema. Heart slightly  enlarged. Calcified slightly tortuous aorta. Scoliosis. Shoulder joint degenerative changes. Surgical clips thyroid region. IMPRESSION: Similar appearance of left hydropneumothorax. Left base subsegmental atelectasis. Slight decrease in degree of left lower chest wall subcutaneous emphysema. Electronically Signed   By: Genia Del M.D.   On: 02/22/2017 09:05   Dg Chest 2 View  Result Date: 02/21/2017 CLINICAL DATA:  Followup left lower lobe atelectasis. Left chest tube recently removed. EXAM: CHEST  2 VIEW COMPARISON:  Yesterday. FINDINGS: Mildly decreased atelectasis at the left lung base. Interval visualization of a small left lateral and apical pneumothorax with an interval air-fluid level overlying the left mid to upper chest. Clear right lung. Normal sized heart. Tortuous and calcified thoracic aorta. Small amount of left lateral subcutaneous emphysema, mildly increased. Surgical clips at the thoracic inlet. Lower thoracic spine degenerative changes. IMPRESSION: 1.  Interval visualization of an approximately 5-10% left hydropneumothorax. 2. Decreased left basilar atelectasis. 3. Mildly increased left subcutaneous emphysema. These results will be called to the ordering clinician or representative by the Radiologist Assistant, and communication documented in the PACS or zVision Dashboard. Electronically Signed   By: Claudie Revering M.D.   On: 02/21/2017 08:43   Nm Pet Image Initial (pi) Skull Base To Thigh  Result Date: 03/17/2017 CLINICAL DATA:  Initial treatment strategy for mesothelioma. EXAM: NUCLEAR MEDICINE PET SKULL BASE TO THIGH TECHNIQUE: 9.3 mCi F-18 FDG was injected intravenously. Full-ring PET imaging was performed from the skull base to thigh after the radiotracer. CT data was obtained and used for attenuation correction and anatomic localization. FASTING BLOOD GLUCOSE:  Value: 118 mg/dl COMPARISON:  CT chest 02/12/2017 FINDINGS: NECK No hypermetabolic lymph nodes in the neck. Bilateral carotid atherosclerotic calcifications. Prior thyroidectomy. CHEST Thin but hypermetabolic pleural rind on the right with some nodularity along the pleural border of the right pericardial tissue. Local nodularity in this vicinity measures 1.1 cm in thickness on image 76/4 and has a maximum SUV of 5.8. Hypermetabolic pleural tissue tracks along the descending thoracic aorta margin, maximum SUV in this region 5.5. Trace residual left pleural effusion. A left upper paratracheal lymph node just below the thoracic inlet in just below the thyroidectomy clips measures 0.9 cm in short axis on image 54/4, with maximum SUV 11.6, compatible with malignant involvement. No involvement of the right hemithorax identified. A right lower paratracheal node measures 1.1 cm in short axis on image 69/4 and has maximum SUV of 3.2, similar to background blood pool activity. Coronary, aortic arch, and branch vessel atherosclerotic vascular disease. Mild cardiomegaly. Faintly metabolic track along the left  lower chest wall probably relates to recent chest tube. ABDOMEN/PELVIS No abnormal hypermetabolic activity within the liver, pancreas, adrenal glands, or spleen. No hypermetabolic lymph nodes in the abdomen or pelvis. 2 mm left kidney lower pole nonobstructive renal calculus, image 137/4. Ectatic abdominal aorta at 2.9 cm. Aortoiliac atherosclerotic vascular disease. Fatty spermatic cords. Sigmoid colon diverticulosis. Mildly enlarged prostate gland. SKELETON No focal hypermetabolic activity to suggest skeletal metastasis. IMPRESSION: 1. Hypermetabolic activity along the left pleural surface. Although some of this could certainly be from recent inflammation, there is also some focal nodularity for example along the upper pericardial margin on image 76/4 which is hypermetabolic and suspicious for mesothelioma. 2. There is also an upper mediastinal lymph node, just below the thoracic inlet, which is hypermetabolic compatible with malignant involvement. 3. No involvement of the abdomen, pelvis, skeleton, or neck. 4. Other imaging findings of potential clinical significance: Atherosclerosis as noted above. Mild cardiomegaly. Ectatic  abdominal aorta. Nonobstructive left nephrolithiasis. Mildly enlarged prostate gland. Electronically Signed   By: Van Clines M.D.   On: 03/17/2017 13:31   Dg Chest Port 1 View  Result Date: 02/20/2017 CLINICAL DATA:  Encounter for LEFT-sided chest tube removal. EXAM: PORTABLE CHEST 1 VIEW COMPARISON:  Radiograph 02/20/2017 FINDINGS: LEFT chest tube removal. No pneumothorax. Small amount subcutaneous gas in the LEFT chest wall. Persistent LEFT basilar atelectasis. RIGHT lung clear IMPRESSION: No pneumothorax following chest tube removal. Persistent LEFT lower lobe atelectasis. Electronically Signed   By: Suzy Bouchard M.D.   On: 02/20/2017 15:23   Dg Chest Port 1 View  Result Date: 02/20/2017 CLINICAL DATA:  Mesothelialioma. EXAM: PORTABLE CHEST 1 VIEW COMPARISON:   02/19/2017 FINDINGS: Stable left-sided chest tube. Interval removal of left basilar chest tube. No evidence of pneumothorax. Lungs are adequately inflated. Subtle left base unchanged likely atelectasis. No definite effusion. Mild stable cardiomegaly. Remainder of the exam is unchanged. IMPRESSION: Stable minimal left base density likely atelectasis. Best left chest tube unchanged. No pneumothorax. Mild stable cardiomegaly. Electronically Signed   By: Marin Olp M.D.   On: 02/20/2017 09:12   Dg Chest Port 1 View  Result Date: 02/19/2017 CLINICAL DATA:  Lung biopsy EXAM: PORTABLE CHEST 1 VIEW COMPARISON:  Yesterday FINDINGS: Stable left chest tubes. There is no pneumothorax. Postoperative changes at the left base. Right lung clear. Low volumes. Cardiomegaly. Normal vascularity. IMPRESSION: Resolved left pneumothorax. Stable postoperative change at the left base. Electronically Signed   By: Marybelle Killings M.D.   On: 02/19/2017 08:54   Dg Chest Port 1 View  Result Date: 02/18/2017 CLINICAL DATA:  Chest tube. EXAM: PORTABLE CHEST 1 VIEW COMPARISON:  02/17/2017. FINDINGS: Interim removal of right IJ line in the upper left chest tube. Two lower chest tubes are in stable position. Tiny left pneumothorax cannot be excluded. Cardiomegaly. Low lung volumes with persistent basilar atelectasis with slight improvement from prior exam. Small left pleural effusion cannot be excluded. No acute bony abnormality. Surgical clips noted in the neck. IMPRESSION: 1. Interim removal of right IJ line and upper left chest tube. Two lower left chest tubes are in stable position . Tiny left pneumothorax cannot be excluded. 2.  Cardiomegaly. 3. Interim partial clearing of basilar atelectasis. Small left pleural effusion. Critical Value/emergent results were called by telephone at the time of interpretation on 02/18/2017 at 7:36 am to nurse Armen Pickup , who verbally acknowledged these results. Electronically Signed   By: Marcello Moores  Register    On: 02/18/2017 07:37    ASSESSMENT: This is a very pleasant 83 years old white male recently diagnosed with stage II malignant pleural mesothelioma, epithelioid type involving the left hemithorax status post left VATS with biopsy and wedge resection of the left lower lobe as well as parietal pleurectomy under the care of Dr. Roxan Hockey on 02/15/2017.  PLAN: I had a lengthy discussion with the patient and his wife today about his current disease stage, prognosis and treatment options. I personally and independently reviewed the scan images and discuss the results and showed the PET scan images to the patient and his wife. I explained to the patient that he has incurable condition and also treatment will be of palliative nature. I discussed with the patient the goals of care. I gave him the option of palliative care versus consideration of palliative systemic chemotherapy with carboplatin for AUC of 5, Alimta 500 MG/M2 and Avastin 15 MG/KG every 3 weeks. I don't think the patient would be a  good candidate for treatment with cisplatin because of the significant toxicity at his age. I discussed with the patient adverse effects of this treatment including but not limited to alopecia, myelosuppression, nausea and vomiting, peripheral neuropathy, liver or renal dysfunction in addition to the adverse effect of Avastin including risk for pulmonary hemorrhage, GI perforation, wound healing delay as well as hypertension and proteinuria.. The patient is currently on monthly vitamin B 12 injection by his primary care physician. We will not give him the additional injection at the cancer center required for the treatment with Alimta at this point. The patient would also receive prescription for Compazine 10 mg by mouth every 6 hours as needed for nausea, Decadron 4 mg by mouth twice a day, the day before, day of and day after the chemotherapy in addition to folic acid 1 mg by mouth daily. I will arrange for the  patient to have a chemotherapy education class before starting the first dose of the chemotherapy. I would also referred the patient back to Dr. Roxan Hockey for consideration of Port-A-Cath placement for IV access for chemotherapy. I gave him prescription for Emla cream to be applied to the Port-A-Cath site before treatment. He is expected to start the first dose of his chemotherapy on 03/31/2017. I would see the patient back for follow-up visit in 2 weeks for evaluation and management of any adverse effect of his treatment. For the small focus of metastatic papillary thyroid carcinoma, we'll continue to monitor him closely for now as it is no other evidence of metastatic disease from the thyroid cancer. For depression the patient will continue his current treatment with Lexapro. He was advised to call immediately if he has any concerning symptoms in the interval. The patient voices understanding of current disease status and treatment options and is in agreement with the current care plan.  All questions were answered. The patient knows to call the clinic with any problems, questions or concerns. We can certainly see the patient much sooner if necessary.  Thank you so much for allowing me to participate in the care of [facility]. I will continue to follow up the patient with you and assist in his care.  I spent 55 minutes counseling the patient face to face. The total time spent in the appointment was 80 minutes.  Disclaimer: This note was dictated with voice recognition software. Similar sounding words can inadvertently be transcribed and may not be corrected upon review.   Cissy Galbreath K. March 19, 2017, 10:58 AM

## 2017-03-20 ENCOUNTER — Encounter: Payer: Self-pay | Admitting: Internal Medicine

## 2017-03-20 DIAGNOSIS — Z5111 Encounter for antineoplastic chemotherapy: Secondary | ICD-10-CM | POA: Insufficient documentation

## 2017-03-20 DIAGNOSIS — Z7189 Other specified counseling: Secondary | ICD-10-CM

## 2017-03-20 DIAGNOSIS — F329 Major depressive disorder, single episode, unspecified: Secondary | ICD-10-CM

## 2017-03-20 HISTORY — DX: Other specified counseling: Z71.89

## 2017-03-20 HISTORY — DX: Major depressive disorder, single episode, unspecified: F32.9

## 2017-03-22 ENCOUNTER — Encounter: Payer: Self-pay | Admitting: General Practice

## 2017-03-22 ENCOUNTER — Other Ambulatory Visit: Payer: Self-pay | Admitting: *Deleted

## 2017-03-22 ENCOUNTER — Telehealth: Payer: Self-pay | Admitting: Internal Medicine

## 2017-03-22 DIAGNOSIS — C459 Mesothelioma, unspecified: Secondary | ICD-10-CM

## 2017-03-22 NOTE — Telephone Encounter (Signed)
Pt wife called wanting to try to work him in on Wednesday instead of Thursday. He will be receiving a port-a-cath on Thursday. He is supposed to be getting a shot in knee and hip for Bursitis. Could he be worked in on Wednesday? Thanks

## 2017-03-22 NOTE — Progress Notes (Signed)
[place] Spiritual Care Note  Received referral for emotional/bereavement support from thoracic navigator Hinton Dyer [place]/RN due to death of pt's grandson.  Noting permission in chart, spoke with Dean Pruitt wife Dean Pruitt this morning. Family is inundated with taxes as well as bereavement stressors; they welcome f/u call next week for deeper support. Plan to phone again Monday 4/23. She verbalized gratitude.  [place], [place], [facility] Pager [phone] Voicemail [phone]

## 2017-03-23 ENCOUNTER — Telehealth: Payer: Self-pay | Admitting: *Deleted

## 2017-03-23 ENCOUNTER — Encounter: Payer: Self-pay | Admitting: *Deleted

## 2017-03-23 ENCOUNTER — Other Ambulatory Visit: Payer: PPO

## 2017-03-23 NOTE — Telephone Encounter (Signed)
Oncology Nurse Navigator Documentation  Oncology Nurse Navigator Flowsheets 03/23/2017  Navigator Location [facility]  Navigator Encounter Type Telephone/I called to check on Dean Pruitt.  I spoke with Dean Pruitt and she states they are doing ok.  I listened as she explained. I offers support and condolences.    Telephone Outgoing Call  Treatment Phase Pre-Tx/Tx Discussion  Barriers/Navigation Needs (No Data)  Interventions Other  Acuity Level 1  Time Spent with Patient 15

## 2017-03-23 NOTE — Telephone Encounter (Signed)
Patient scheduled tomorrow 12:45pm.

## 2017-03-23 NOTE — Progress Notes (Signed)
[name] [address] [place], [place] [address] Phone: [phone] Subjective:    I'm seeing this patient by the request  of:  [name], MD  CC: Foot pain left hip pain f/u   [id number]  Dean Pruitt is a 83 y.o. male coming in with complaint of foot pain or weakness. Patient was found to have some weakness of the lower extremity and there is concern that this is coming from the lumbar back. Patient was given prednisone and gabapentin. Patient did have x-rays of the lumbar spine done. X-rays were independently visualized by me. Patient was found to have mild to moderate degenerative disc disease and mild facet arthropathy at L4-S1. Patient also was found to have what appeared to be an abdominal aortic aneurysm measuring approximately 3.7 cm. Patient was sent for an abdominal ultrasound. Abdominal aneurysm seemed to be stable. Patient though did have a left pleural effusion. And I reviewed the chart patient had this pleural effusion for 10 months. Was sent for a CT of the chest. CT chest with contrast was also reviewed by me. Patient was found to have a moderate left pleural effusion as well as what appeared to be a nodule approximately 10 mm within the effusion of the left lobe. Patient did have a paracentesis. Patient did have reaccumulation and did have a no apparent thoracentesis done on 12/08/2016. Patient did have repeat chest x-rays done 12/18/2016 that did not show any reaccumulation.   Patient states he has noticed some improvement in leg strength. Continued  03/24/2017 update-patient's was previously seen and did respond well to the greater trochanter bursitis. Patient is also responding fairly well to an epidural steroid injection previously. Patient since has been diagnosed with a mesothelioma. Patient is scheduled to have a Port-A-Cath in the near future.to have weakness of the foot. MRI of the lumbar spine was taken 11/09/2016. This was  independently visualized by me. Patient's does have a large epidural cyst expansion of the sacral canal and remodeling of S2 and S3 from a tarlov cyst. This appears large enough that they could cause some nerve compression.  12/31/2016 update Patient at last exam was not making any significant improvement and was sent for an epidural steroid injection. This was accomplished on 12/03/2016. patient states he did make some improvement. Patient did seem neurosurgery who stated that they do not went to do any type of intervention with the nurse pain wrapped around the cyst itself. Patient states that all the pain now seems to be localized mostly over the lateral hip. No significant pain radiating down the leg. States that the pain in the foot has completely resolved as well. Patient states that if she he did not have the pain on the lateral aspect the hip he would be doing significantly better.    03/24/2017 update-patient has been diagnosed mesothelioma and undergoing chemotherapy. Starting have increasing hip pain. Seems to be on the lateral aspect again. Waking him up at night. Patient is also having of left knee pain. States that some instability. Has been told previously that he does have arthritis. Patient denies any swelling. Rates the severity of pain a 6 out of 10. Patient will be undergoing chemotherapy as we've previously stated he wasn't no effusion do a feels now to help him with his no certain treatment options will not be available he is been told by his oncologist.   Past Medical History:  Diagnosis Date  . Cataract   .  Depression    situational  . GERD (gastroesophageal reflux disease)   . Goals of care, counseling/discussion 03/20/2017  . Hyperlipidemia   . Hypothyroidism   . Reactive depression 03/20/2017  . Thyroid cancer ([facility])    PMH of; on supressive therapy   Past Surgical History:  Procedure Laterality Date  . CATARACT EXTRACTION, BILATERAL  2013 & 2014   Dr Celene Squibb  .  colon polyps  2002 & 2005   negative 2008; Dr Earlean Shawl  . COLONOSCOPY W/ POLYPECTOMY    . LUNG BIOPSY Left 02/15/2017   Procedure: LUNG BIOPSY;  Surgeon: Melrose Nakayama, MD;  Location: [facility];  Service: Thoracic;  Laterality: Left;  . PLEURAL EFFUSION DRAINAGE Left 02/15/2017   Procedure: DRAINAGE OF PLEURAL EFFUSION;  Surgeon: Melrose Nakayama, MD;  Location: [facility];  Service: Thoracic;  Laterality: Left;  . THYROIDECTOMY  2001   S/P RAI  . VIDEO ASSISTED THORACOSCOPY Left 02/15/2017   Procedure: VIDEO ASSISTED THORACOSCOPY;  Surgeon: Melrose Nakayama, MD;  Location: [facility];  Service: Thoracic;  Laterality: Left;   Social History   Social History  . Marital status: Married    Spouse name: N/A  . Number of children: 2  . Years of education: N/A   Occupational History  . Retired    Social History Main Topics  . Smoking status: Former Smoker    Packs/day: 1.00    Years: 5.00    Types: Cigarettes    Quit date: 12/07/1958  . Smokeless tobacco: Never Used     Comment: smoked 1956-1960, up 1 ppd  . Alcohol use 8.4 oz/week    14 Glasses of wine per week  . Drug use: No  . Sexual activity: Not on file   Other Topics Concern  . Not on file   Social History Narrative   Exercise:  Yard work, walks on occasion                  Allergies  Allergen Reactions  . Ativan [Lorazepam] Other (See Comments)    Mental status change-"went crazy in the head"  . Tramadol Other (See Comments)    Altered mental status "crazy in the head'   Family History  Problem Relation Age of Onset  . Alzheimer's disease Mother   . Hypertension Mother   . Heart disease Father     CAD and angioplasty in 26s  . Cancer Father     Bladder  . Other Brother     valvular heart disease  . Diabetes Neg Hx   . Stroke Neg Hx     Past medical history, social, surgical and family history all reviewed in electronic medical record.  No pertanent information unless stated regarding to the chief  complaint.   Review of Systems: No headache, visual changes, nausea, vomiting, diarrhea, constipation, dizziness, abdominal pain, skin rash, fevers, chills, night sweats, weight loss, swollen lymph nodes, , chest pain, shortness of breath, mood changes.  Muscle aches and body aches   Objective  There were no vitals taken for this visit.   Systems examined below as of 03/24/17 General: NAD A&O x3 mood, affect normal  HEENT: Pupils equal, extraocular movements intact no nystagmus Respiratory: not short of breath at rest or with speaking Cardiovascular: No lower extremity edema, non tender Skin: Warm dry intact with no signs of infection or rash on extremities or on axial skeleton. Abdomen: Soft nontender, no masses Neuro: Cranial nerves  intact, neurovascularly intact in all extremities with  2+ DTRs and 2+ pulses. Lymph: No lymphadenopathy appreciated today    MSK:  Non tender with full range of motion and good stability and symmetric strength and tone of shoulders, elbows, wrist, , Ankles bilaterally. Mild arthritic changes of multiple joints Hip: Left ROM IR: 25 Deg, ER: 45 Deg, Flexion: 120 Deg, Extension: 100 Deg, Abduction: 45 Deg, Adduction: 25 Deg Strength IR: 5/5, ER: 5/5, Flexion: 5/5, Extension: 5/5, Abduction: 5/5, Adduction: 5/5 Pelvic alignment unremarkable to inspection and palpation. Standing hip rotation and gait without trendelenburg sign / unsteadiness. Severe tenderness over the greater trochanteric area on the left side. Positive Faber on the left sign. No pain with internal rotation of the hip. No SI joint tenderness and normal minimal SI movement.  Knee: Left valgus deformity noted. Large thigh to calf ratio.  Tender to palpation over medial and PF joint line.  ROM full in flexion and extension and lower leg rotation. instability with valgus force.  painful patellar compression. Patellar glide with moderate crepitus. Patellar and quadriceps tendons  unremarkable. Hamstring and quadriceps strength is normal. Contralateral knee shows mild arthritic changes.   Procedure: Real-time Ultrasound Guided Injection of left  greater trochanteric bursitis secondary to patient's body habitus Device: GE Logiq Q7  Ultrasound guided injection is preferred based studies that show increased duration, increased effect, greater accuracy, decreased procedural pain, increased response rate, and decreased cost with ultrasound guided versus blind injection.  Verbal informed consent obtained.  Time-out conducted.  Noted no overlying erythema, induration, or other signs of local infection.  Skin prepped in a sterile fashion.  Local anesthesia: Topical Ethyl chloride.  With sterile technique and under real time ultrasound guidance:  Greater trochanteric area was visualized and patient's bursa was noted. A 22-gauge 3 inch needle was inserted and 4 cc of 0.5% Marcaine and 1 cc of Kenalog 40 mg/dL was injected. Pictures taken Completed without difficulty  Pain immediately resolved suggesting accurate placement of the medication.  Advised to call if fevers/chills, erythema, induration, drainage, or persistent bleeding.  Images permanently stored and available for review in the ultrasound unit.  Impression: Technically successful ultrasound guided injection.  After informed written and verbal consent, patient was seated on exam table. Left knee was prepped with alcohol swab and utilizing anterolateral approach, patient's left knee space was injected with 4:1  marcaine 0.5%: Kenalog '40mg'$ /dL. Patient tolerated the procedure well without immediate complications.     Impression and Recommendations:     This case required medical decision making of moderate complexity.      Note: This dictation was prepared with Dragon dictation along with smaller phrase technology. Any transcriptional errors that result from this process are unintentional.

## 2017-03-24 ENCOUNTER — Ambulatory Visit (INDEPENDENT_AMBULATORY_CARE_PROVIDER_SITE_OTHER): Payer: PPO | Admitting: Family Medicine

## 2017-03-24 ENCOUNTER — Ambulatory Visit: Payer: Self-pay

## 2017-03-24 ENCOUNTER — Encounter: Payer: Self-pay | Admitting: Family Medicine

## 2017-03-24 ENCOUNTER — Encounter (HOSPITAL_COMMUNITY): Payer: Self-pay | Admitting: *Deleted

## 2017-03-24 ENCOUNTER — Ambulatory Visit (INDEPENDENT_AMBULATORY_CARE_PROVIDER_SITE_OTHER): Payer: PPO | Admitting: Neurology

## 2017-03-24 VITALS — BP 116/84 | HR 84 | Resp 16 | Wt 195.0 lb

## 2017-03-24 DIAGNOSIS — M1712 Unilateral primary osteoarthritis, left knee: Secondary | ICD-10-CM | POA: Diagnosis not present

## 2017-03-24 DIAGNOSIS — M7062 Trochanteric bursitis, left hip: Secondary | ICD-10-CM

## 2017-03-24 DIAGNOSIS — M7072 Other bursitis of hip, left hip: Secondary | ICD-10-CM | POA: Diagnosis not present

## 2017-03-24 DIAGNOSIS — E538 Deficiency of other specified B group vitamins: Secondary | ICD-10-CM

## 2017-03-24 DIAGNOSIS — M25551 Pain in right hip: Secondary | ICD-10-CM | POA: Diagnosis not present

## 2017-03-24 MED ORDER — CYANOCOBALAMIN 1000 MCG/ML IJ SOLN
1000.0000 ug | Freq: Once | INTRAMUSCULAR | Status: AC
  Administered 2017-03-24: 1000 ug via INTRAMUSCULAR

## 2017-03-24 NOTE — Progress Notes (Signed)
SDW-Pre-op call completed by both pt and pt spouse, Dean Pruitt (DPR) . Pt denies SOB, chest pain, and being under the care of a cardiologist. Pt denies having an echo and cardiac cath but spouse stated that a stress test was performed at  " [facility] a long time ago, it has been at least 4-5 years. " Pt made aware to stop taking Aspirin, vitamins, fish oil and herbal medications. Do not take any NSAIDs ie: Ibuprofen, Advil, Naproxen, BC  or any medication containing Aspirin. Spouse verbalized understanding of all pre-op instructions.

## 2017-03-24 NOTE — Assessment & Plan Note (Signed)
Given an injection today. Tolerated the procedure well. Encourage him to continue the gabapentin case there is some lumbar radiculopathy causing some of the discomfort. We discussed icing regimen, topical anti-inflammatories. Patient will follow-up with me again in 10-12 weeks.

## 2017-03-24 NOTE — Anesthesia Preprocedure Evaluation (Addendum)
Anesthesia Evaluation  Patient identified by MRN, date of birth, ID band Patient awake    Reviewed: Allergy & Precautions, NPO status , Patient's Chart, lab work & pertinent test results  Airway Mallampati: I  TM Distance: >3 FB     Dental  (+) Dental Advisory Given   Pulmonary former smoker,  Pleural effusion   breath sounds clear to auscultation       Cardiovascular + Peripheral Vascular Disease   Rhythm:Regular Rate:Normal     Neuro/Psych    GI/Hepatic GERD  ,  Endo/Other  Hypothyroidism   Renal/GU      Musculoskeletal   Abdominal   Peds  Hematology   Anesthesia Other Findings   Reproductive/Obstetrics                            Lab Results  Component Value Date   WBC 6.7 03/19/2017   HGB 14.9 03/19/2017   HCT 44.4 03/19/2017   MCV 95.3 03/19/2017   PLT 246 03/19/2017   Lab Results  Component Value Date   CREATININE 1.1 03/19/2017   BUN 22.6 03/19/2017   NA 140 03/19/2017   K 4.5 03/19/2017   CL 101 02/17/2017   CO2 24 03/19/2017    Anesthesia Physical  Anesthesia Plan  ASA: II  Anesthesia Plan: MAC   Post-op Pain Management:    Induction: Intravenous  Airway Management Planned: Natural Airway and Simple Face Mask  Additional Equipment:   Intra-op Plan:   Post-operative Plan:   Informed Consent: I have reviewed the patients History and Physical, chart, labs and discussed the procedure including the risks, benefits and alternatives for the proposed anesthesia with the patient or authorized representative who has indicated his/her understanding and acceptance.     Plan Discussed with: CRNA  Anesthesia Plan Comments:        Anesthesia Quick Evaluation

## 2017-03-24 NOTE — Progress Notes (Signed)
Pt here for B12 injection. Gave 1032mg of Cyanocobalamin in Left Deltoid. Patient tolerated well. Band-Aid applied.

## 2017-03-24 NOTE — Progress Notes (Signed)
Pre-visit discussion using our clinic review tool. No additional management support is needed unless otherwise documented below in the visit note.  

## 2017-03-24 NOTE — Assessment & Plan Note (Signed)
Injected today. Not a candidate for replacement. Could be a candidate for viscous supplementation in custom brace. He'll follow-up in 4-6 weeks

## 2017-03-25 ENCOUNTER — Ambulatory Visit (HOSPITAL_COMMUNITY): Payer: PPO

## 2017-03-25 ENCOUNTER — Encounter (HOSPITAL_COMMUNITY)
Admission: RE | Disposition: A | Discharge status: Home / Self Care | Payer: Self-pay | Source: Ambulatory Visit | Attending: Thoracic Surgery (Cardiothoracic Vascular Surgery)

## 2017-03-25 ENCOUNTER — Ambulatory Visit (HOSPITAL_COMMUNITY)
Admission: RE | Admit: 2017-03-25 | Discharge: 2017-03-25 | Disposition: A | Discharge status: Home / Self Care | Payer: PPO | Source: Ambulatory Visit | Attending: Thoracic Surgery (Cardiothoracic Vascular Surgery) | Admitting: Thoracic Surgery (Cardiothoracic Vascular Surgery)

## 2017-03-25 ENCOUNTER — Ambulatory Visit (HOSPITAL_COMMUNITY): Payer: PPO | Admitting: Anesthesiology

## 2017-03-25 ENCOUNTER — Encounter (HOSPITAL_COMMUNITY): Payer: Self-pay | Admitting: *Deleted

## 2017-03-25 ENCOUNTER — Ambulatory Visit: Payer: PPO | Admitting: Family Medicine

## 2017-03-25 DIAGNOSIS — K219 Gastro-esophageal reflux disease without esophagitis: Secondary | ICD-10-CM | POA: Diagnosis not present

## 2017-03-25 DIAGNOSIS — Z85850 Personal history of malignant neoplasm of thyroid: Secondary | ICD-10-CM | POA: Diagnosis not present

## 2017-03-25 DIAGNOSIS — R350 Frequency of micturition: Secondary | ICD-10-CM | POA: Diagnosis not present

## 2017-03-25 DIAGNOSIS — Z419 Encounter for procedure for purposes other than remedying health state, unspecified: Secondary | ICD-10-CM

## 2017-03-25 DIAGNOSIS — F329 Major depressive disorder, single episode, unspecified: Secondary | ICD-10-CM | POA: Insufficient documentation

## 2017-03-25 DIAGNOSIS — J910 Malignant pleural effusion: Secondary | ICD-10-CM | POA: Diagnosis not present

## 2017-03-25 DIAGNOSIS — Z452 Encounter for adjustment and management of vascular access device: Secondary | ICD-10-CM

## 2017-03-25 DIAGNOSIS — Z87891 Personal history of nicotine dependence: Secondary | ICD-10-CM | POA: Insufficient documentation

## 2017-03-25 DIAGNOSIS — Z7982 Long term (current) use of aspirin: Secondary | ICD-10-CM | POA: Insufficient documentation

## 2017-03-25 DIAGNOSIS — I700 Atherosclerosis of aorta: Secondary | ICD-10-CM | POA: Insufficient documentation

## 2017-03-25 DIAGNOSIS — E785 Hyperlipidemia, unspecified: Secondary | ICD-10-CM | POA: Diagnosis not present

## 2017-03-25 DIAGNOSIS — I739 Peripheral vascular disease, unspecified: Secondary | ICD-10-CM | POA: Diagnosis not present

## 2017-03-25 DIAGNOSIS — Z01818 Encounter for other preprocedural examination: Secondary | ICD-10-CM | POA: Diagnosis not present

## 2017-03-25 DIAGNOSIS — Z79899 Other long term (current) drug therapy: Secondary | ICD-10-CM | POA: Insufficient documentation

## 2017-03-25 DIAGNOSIS — C459 Mesothelioma, unspecified: Secondary | ICD-10-CM | POA: Diagnosis not present

## 2017-03-25 DIAGNOSIS — E039 Hypothyroidism, unspecified: Secondary | ICD-10-CM | POA: Diagnosis not present

## 2017-03-25 DIAGNOSIS — C450 Mesothelioma of pleura: Secondary | ICD-10-CM | POA: Insufficient documentation

## 2017-03-25 DIAGNOSIS — Z9889 Other specified postprocedural states: Secondary | ICD-10-CM | POA: Diagnosis not present

## 2017-03-25 HISTORY — PX: PORTACATH PLACEMENT: SHX2246

## 2017-03-25 LAB — CBC
HEMATOCRIT: 43 % (ref 39.0–52.0)
HEMOGLOBIN: 14.3 g/dL (ref 13.0–17.0)
MCH: 31.5 pg (ref 26.0–34.0)
MCHC: 33.3 g/dL (ref 30.0–36.0)
MCV: 94.7 fL (ref 78.0–100.0)
Platelets: 240 10*3/uL (ref 150–400)
RBC: 4.54 MIL/uL (ref 4.22–5.81)
RDW: 13.6 % (ref 11.5–15.5)
WBC: 6.7 10*3/uL (ref 4.0–10.5)

## 2017-03-25 LAB — COMPREHENSIVE METABOLIC PANEL
ALT: 13 U/L — AB (ref 17–63)
AST: 22 U/L (ref 15–41)
Albumin: 3.6 g/dL (ref 3.5–5.0)
Alkaline Phosphatase: 79 U/L (ref 38–126)
Anion gap: 11 (ref 5–15)
BILIRUBIN TOTAL: 0.6 mg/dL (ref 0.3–1.2)
BUN: 13 mg/dL (ref 6–20)
CO2: 25 mmol/L (ref 22–32)
CREATININE: 1.02 mg/dL (ref 0.61–1.24)
Calcium: 9.4 mg/dL (ref 8.9–10.3)
Chloride: 103 mmol/L (ref 101–111)
Glucose, Bld: 100 mg/dL — ABNORMAL HIGH (ref 65–99)
Potassium: 4.1 mmol/L (ref 3.5–5.1)
Sodium: 139 mmol/L (ref 135–145)
Total Protein: 6.2 g/dL — ABNORMAL LOW (ref 6.5–8.1)

## 2017-03-25 LAB — PROTIME-INR
INR: 1
PROTHROMBIN TIME: 13.2 s (ref 11.4–15.2)

## 2017-03-25 LAB — APTT: aPTT: 29 seconds (ref 24–36)

## 2017-03-25 SURGERY — INSERTION, TUNNELED CENTRAL VENOUS DEVICE, WITH PORT
Anesthesia: Monitor Anesthesia Care | Site: Neck

## 2017-03-25 MED ORDER — ACETAMINOPHEN 10 MG/ML IV SOLN: 1000.0000 mg | Freq: Once | INTRAVENOUS | Status: DC | PRN

## 2017-03-25 MED ORDER — FENTANYL CITRATE (PF) 250 MCG/5ML IJ SOLN
INTRAMUSCULAR | Status: AC
  Filled 2017-03-25: qty 5

## 2017-03-25 MED ORDER — EPHEDRINE SULFATE-NACL 50-0.9 MG/10ML-% IV SOSY
PREFILLED_SYRINGE | INTRAVENOUS | Status: DC | PRN
  Administered 2017-03-25: 5 mg via INTRAVENOUS

## 2017-03-25 MED ORDER — LIDOCAINE HCL 1 % IJ SOLN
INTRAMUSCULAR | Status: AC
  Filled 2017-03-25: qty 40

## 2017-03-25 MED ORDER — LIDOCAINE HCL 1 % IJ SOLN
INTRAMUSCULAR | Status: DC | PRN
  Administered 2017-03-25: 14 mL

## 2017-03-25 MED ORDER — OXYCODONE HCL 5 MG PO TABS: 5.0000 mg | ORAL_TABLET | Freq: Four times a day (QID) | ORAL | 0 refills | Status: DC | PRN

## 2017-03-25 MED ORDER — EPHEDRINE 5 MG/ML INJ
INTRAVENOUS | Status: AC
  Filled 2017-03-25: qty 10

## 2017-03-25 MED ORDER — FENTANYL CITRATE (PF) 100 MCG/2ML IJ SOLN
INTRAMUSCULAR | Status: DC | PRN
  Administered 2017-03-25: 25 ug via INTRAVENOUS

## 2017-03-25 MED ORDER — DEXTROSE 5 % IV SOLN
INTRAVENOUS | Status: DC | PRN
  Administered 2017-03-25: 25 ug/min via INTRAVENOUS

## 2017-03-25 MED ORDER — OXYCODONE HCL 5 MG PO TABS: 5.0000 mg | ORAL_TABLET | Freq: Four times a day (QID) | ORAL | Status: DC | PRN

## 2017-03-25 MED ORDER — HEPARIN SODIUM (PORCINE) 1000 UNIT/ML IJ SOLN
INTRAMUSCULAR | Status: AC
  Filled 2017-03-25: qty 1

## 2017-03-25 MED ORDER — DEXTROSE 5 % IV SOLN
1.5000 g | INTRAVENOUS | Status: AC
  Administered 2017-03-25: 1.5 g via INTRAVENOUS
  Filled 2017-03-25: qty 1.5

## 2017-03-25 MED ORDER — LACTATED RINGERS IV SOLN
INTRAVENOUS | Status: DC | PRN
  Administered 2017-03-25: 07:00:00 via INTRAVENOUS

## 2017-03-25 MED ORDER — 0.9 % SODIUM CHLORIDE (POUR BTL) OPTIME
TOPICAL | Status: DC | PRN
  Administered 2017-03-25: 1000 mL

## 2017-03-25 MED ORDER — PROPOFOL 10 MG/ML IV BOLUS
INTRAVENOUS | Status: AC
  Filled 2017-03-25: qty 20

## 2017-03-25 MED ORDER — PROPOFOL 500 MG/50ML IV EMUL
INTRAVENOUS | Status: DC | PRN
  Administered 2017-03-25: 50 ug/kg/min via INTRAVENOUS

## 2017-03-25 MED ORDER — FENTANYL CITRATE (PF) 100 MCG/2ML IJ SOLN: 25.0000 ug | INTRAMUSCULAR | Status: DC | PRN

## 2017-03-25 MED ORDER — PROPOFOL 10 MG/ML IV BOLUS
INTRAVENOUS | Status: DC | PRN
  Administered 2017-03-25: 10 mg via INTRAVENOUS
  Administered 2017-03-25 (×2): 20 mg via INTRAVENOUS

## 2017-03-25 MED ORDER — PROPOFOL 1000 MG/100ML IV EMUL
INTRAVENOUS | Status: AC
  Filled 2017-03-25: qty 100

## 2017-03-25 MED ORDER — HEPARIN SODIUM (PORCINE) 1000 UNIT/ML IJ SOLN
INTRAMUSCULAR | Status: DC | PRN
  Administered 2017-03-25: 2000 [IU] via INTRAVENOUS

## 2017-03-25 MED ORDER — SODIUM CHLORIDE 0.9 % IV SOLN
INTRAVENOUS | Status: DC | PRN
  Administered 2017-03-25: 500 mL

## 2017-03-25 MED ORDER — IOPAMIDOL (ISOVUE-300) INJECTION 61%
INTRAVENOUS | Status: AC
  Filled 2017-03-25: qty 50

## 2017-03-25 SURGICAL SUPPLY — 41 items
ADH SKN CLS APL DERMABOND .7 (GAUZE/BANDAGES/DRESSINGS) ×1
ADH SKN CLS LQ APL DERMABOND (GAUZE/BANDAGES/DRESSINGS) ×1
BAG DECANTER FOR FLEXI CONT (MISCELLANEOUS) ×3 IMPLANT
CANISTER SUCT 3000ML PPV (MISCELLANEOUS) ×3 IMPLANT
COVER SURGICAL LIGHT HANDLE (MISCELLANEOUS) ×3 IMPLANT
DERMABOND ADHESIVE PROPEN (GAUZE/BANDAGES/DRESSINGS) ×2
DERMABOND ADVANCED (GAUZE/BANDAGES/DRESSINGS) ×2
DERMABOND ADVANCED .7 DNX12 (GAUZE/BANDAGES/DRESSINGS) ×1 IMPLANT
DERMABOND ADVANCED .7 DNX6 (GAUZE/BANDAGES/DRESSINGS) IMPLANT
DRAPE C-ARM 42X72 X-RAY (DRAPES) ×3 IMPLANT
DRAPE CHEST BREAST 15X10 FENES (DRAPES) ×3 IMPLANT
ELECT REM PT RETURN 9FT ADLT (ELECTROSURGICAL) ×3
ELECTRODE REM PT RTRN 9FT ADLT (ELECTROSURGICAL) ×1 IMPLANT
GLOVE SURG SIGNA 7.5 PF LTX (GLOVE) ×3 IMPLANT
GOWN STRL REUS W/ TWL LRG LVL3 (GOWN DISPOSABLE) ×1 IMPLANT
GOWN STRL REUS W/ TWL XL LVL3 (GOWN DISPOSABLE) ×1 IMPLANT
GOWN STRL REUS W/TWL LRG LVL3 (GOWN DISPOSABLE) ×3
GOWN STRL REUS W/TWL XL LVL3 (GOWN DISPOSABLE) ×3
GUIDEWIRE UNCOATED ST S 7038 (WIRE) IMPLANT
INTRODUCER 13FR (MISCELLANEOUS) IMPLANT
INTRODUCER COOK 11FR (CATHETERS) IMPLANT
KIT BASIN OR (CUSTOM PROCEDURE TRAY) ×3 IMPLANT
KIT PORT POWER 9.6FR MRI PREA (Catheter) ×6 IMPLANT
KIT ROOM TURNOVER OR (KITS) ×3 IMPLANT
NDL HYPO 25GX1X1/2 BEV (NEEDLE) ×1 IMPLANT
NEEDLE HYPO 25GX1X1/2 BEV (NEEDLE) ×3 IMPLANT
NS IRRIG 1000ML POUR BTL (IV SOLUTION) ×3 IMPLANT
PACK GENERAL/GYN (CUSTOM PROCEDURE TRAY) ×3 IMPLANT
PAD ARMBOARD 7.5X6 YLW CONV (MISCELLANEOUS) ×6 IMPLANT
SET SHEATH INTRODUCER 10FR (MISCELLANEOUS) IMPLANT
SUT MNCRL AB 4-0 PS2 18 (SUTURE) IMPLANT
SUT VIC AB 3-0 SH 27 (SUTURE) ×12
SUT VIC AB 3-0 SH 27X BRD (SUTURE) ×2 IMPLANT
SUT VICRYL 4-0 PS2 18IN ABS (SUTURE) ×5 IMPLANT
SYR 20CC LL (SYRINGE) ×3 IMPLANT
SYR 5ML LL (SYRINGE) ×3 IMPLANT
SYR CONTROL 10ML LL (SYRINGE) ×3 IMPLANT
TOWEL OR 17X24 6PK STRL BLUE (TOWEL DISPOSABLE) ×3 IMPLANT
TOWEL OR 17X26 10 PK STRL BLUE (TOWEL DISPOSABLE) ×3 IMPLANT
WATER STERILE IRR 1000ML POUR (IV SOLUTION) ×3 IMPLANT
WIRE .035 3MM-J 145CM (WIRE) ×3 IMPLANT

## 2017-03-25 NOTE — Anesthesia Procedure Notes (Signed)
Procedure Name: MAC Date/Time: 03/25/2017 8:17 AM Performed by: Mervyn Gay Pre-anesthesia Checklist: Patient identified, Patient being monitored, Timeout performed, Emergency Drugs available and Suction available Patient Re-evaluated:Patient Re-evaluated prior to inductionOxygen Delivery Method: Simple face mask Preoxygenation: Pre-oxygenation with 100% oxygen Number of attempts: 1 Placement Confirmation: positive ETCO2 Dental Injury: Teeth and Oropharynx as per pre-operative assessment

## 2017-03-25 NOTE — Op Note (Signed)
NAME:  Dean Pruitt, Dean Pruitt NO.:  [id number]  MEDICAL RECORD NO.:  [id number]  LOCATION:  PERIO                        FACILITY:  [facility]  PHYSICIAN:  Revonda Standard. Roxan Hockey, M.D.DATE OF BIRTH:  1934-09-04  DATE OF PROCEDURE:  03/25/2017 DATE OF DISCHARGE:                              OPERATIVE REPORT   PREOPERATIVE DIAGNOSIS:  Malignant mesothelioma, needs IV access for chemotherapy.  POSTOPERATIVE DIAGNOSIS:  Malignant mesothelioma, needs IV access for chemotherapy.  PROCEDURE:  Placement of Port-A-Cath, right internal jugular vein.  SURGEON:  Revonda Standard. Roxan Hockey, M.D.  ASSISTANT:  None.  ANESTHESIA:  Local with intravenous sedation.  FINDINGS:  Port initially placed via left subclavian vein, but poor venous return.  On repeat attempt to place via subclavian vein the port would not pass through sheath.  Port passed easily from IJ with tip at right Atrial- SVC junction. Good blood return and flushed easily.  CLINICAL NOTE:  Dean Pruitt is an 83 year old man recently diagnosed with malignant mesothelioma.  He needs intravenous access for chemotherapy.  He was advised to have a Port-A-Cath placement.  The indications, risks, benefits, and alternatives were discussed in detail with the patient.  He understood and accepted the risks and agreed to proceed.  OPERATIVE NOTE:  Dean Pruitt was brought to the operating room on March 25, 2017.  He was given intravenous sedation and monitored by the Anesthesia Service.  Intravenous antibiotics were administered.  The neck and chest were prepped and draped in usual sterile fashion.  The patient was placed in Trendelenburg position.  Local anesthesia was achieved with 10 mL of 1% lidocaine in the right subclavian fossa.  The right subclavian vein then was accessed using the Seldinger technique. Fluoroscopy showed the wire traversed into the internal jugular.  It was repositioned under fluoroscopy and into the SVC.  The  peel-away sheath introducer was placed over the wire into the superior vena cava.  The port passed easily through the wire, and the peel-away sheath introducer was removed.  There was poor return from the catheter, although there was no resistance to flushing the port.  Therefore, the port was removed.  The subclavian vein was again accessed using the Seldinger technique.  The peel-away sheath introducer was again advanced over the wire and passed easily.  The wire and the inner cannula were removed. An attempt to advance the port through the peel-away sheath introducer was unsuccessful.  Fluoroscopy showed the catheter was not passing the head of the clavicle.  The wire was replaced and the peel-away sheath was removed.  The inner cannula was repositioned and the peel-away sheath introducer was again passed over the wire without resistance and again attempts to pass the Port-A-Cath were unsuccessful.  The introducer was removed.  The right neck over the internal jugular vein then was anesthetized with an additional 5 mL of 1% lidocaine.  The internal jugular was identified with a small finder needle.  It then was accessed using the Seldinger technique and a wire passed easily. Fluoroscopy showed the wire to be within the right atrium.  A small incision was made incorporating the wire.  A new 9.6-French PowerPort with a pre-attached catheter was cut to  an appropriate length.  It was tunneled from the incision for placement of the port in the right chest and through the incision in the right neck.  The peel-away sheath introducer was advanced over the wire.  The inner cannula was removed. The port advanced easily.  Fluoroscopy showed the tip of the port to be at the right atrial- SVC junction.  The peel-away introducer was removed. There was good return from the port and it flushed easily with heparinized saline.  The port was tacked at 4 sites to the pectoralis fascia in the pocket that  was created.  It then was flushed with 2.5 mL of concentrated heparin.  The neck incision was closed with two interrupted 4-0 Vicryl subcuticular sutures.  The chest incision was copiously irrigated with warm saline.  It then was closed with a running 3-0 Vicryl subcutaneous suture and a 4-0 Vicryl subcuticular suture. Dermabond was applied to both incisions.  The patient was then taken from the operating room to the postanesthetic care unit in good condition.     Revonda Standard Roxan Hockey, M.D.     SCH/MEDQ  D:  03/25/2017  T:  03/25/2017  Job:  [id number]

## 2017-03-25 NOTE — Brief Op Note (Signed)
03/25/2017  10:01 AM  PATIENT:  Dean Pruitt  83 y.o. male  PRE-OPERATIVE DIAGNOSIS:  MESOTHELIOMA, needs IV access for chemo  POST-OPERATIVE DIAGNOSIS:  MESOTHELIOMA, needs IV access for chemo  PROCEDURE:  Procedure(s): INSERTION PORT-A-CATH, right internal jugular (N/A)  SURGEON:  Surgeon(s) and Role:    * Melrose Nakayama, MD - Primary  ANESTHESIA:   local and IV sedation  EBL:  Total I/O In: 800 [I.V.:800] Out: 10 [Blood:10]  BLOOD ADMINISTERED:none  DRAINS: none   LOCAL MEDICATIONS USED:  LIDOCAINE  and Amount: 15 ml  SPECIMEN:  No Specimen  DISPOSITION OF SPECIMEN:  N/A  COUNTS:  YES  PLAN OF CARE: Discharge to home after PACU  PATIENT DISPOSITION:  PACU - hemodynamically stable.   Delay start of Pharmacological VTE agent (>24hrs) due to surgical blood loss or risk of bleeding: not applicable  Unable to pass catheter through sheath in left subclavian. Passed easily from IJ.

## 2017-03-25 NOTE — Transfer of Care (Signed)
Immediate Anesthesia Transfer of Care Note  Patient: [place]  Procedure(s) Performed: Procedure(s): INSERTION PORT-A-CATH, right internal jugular (N/A)  Patient Location: PACU  Anesthesia Type:MAC  Level of Consciousness: awake, alert , oriented and patient cooperative  Airway & Oxygen Therapy: Spont. On RA   Post-op Assessment: Report given to RN, Post -op Vital signs reviewed and stable and Patient moving all extremities X 4  Post vital signs: Reviewed and stable  Last Vitals:  Vitals:   03/25/17 0703  BP: 138/81  Pulse: 63  Resp: 18  Temp: 36.7 C    Last Pain: There were no vitals filed for this visit.    Patients Stated Pain Goal: 1 (22/02/54 2706)  Complications: No apparent anesthesia complications

## 2017-03-25 NOTE — H&P (View-Only) (Signed)
[address]       [place] [address]             [phone]    HPI: Dean Pruitt returns for a scheduled postoperative follow-up visit.  Dean Pruitt is an 83 year old gentleman who presented with a recurrent left pleural effusion. He first diagnosis around Thanksgiving. He had multiple large volume thoracenteses. Cytologies were negative.  I did a left VATS and parietal pleurectomy on 02/15/2017. His postoperative course was, complicated by delirium. He was discharged on postoperative day 7. Pathology showed malignant mesothelioma.  He is feeling well. His wife says his mental status is back to his baseline. He took tramadol one time but that caused hallucinations and he has not taken any pain medication. He denies pain and is not even taking Tylenol. He denies any shortness of breath. Past Medical History:  Diagnosis Date  . Cataract   . Depression    situational  . GERD (gastroesophageal reflux disease)   . Hyperlipidemia   . Hypothyroidism   . Thyroid cancer [facility])    PMH of; on supressive therapy    Current Outpatient Prescriptions  Medication Sig Dispense Refill  . aspirin 81 MG tablet Take 81 mg by mouth daily.    Marland Kitchen escitalopram (LEXAPRO) 10 MG tablet Take 1 tablet (10 mg total) by mouth at bedtime. 90 tablet 1  . gabapentin (NEURONTIN) 300 MG capsule nightly (Patient taking differently: Take 300 mg by mouth at bedtime. nightly) 30 capsule 3  . levothyroxine (SYNTHROID, LEVOTHROID) 150 MCG tablet TAKE 1 TABLET (150 mcg) BY MOUTH 3 days a week, 75 mcg 4 days a week (Patient taking differently: Take 75-150 mcg by mouth See admin instructions. 150 mcg every day except Tuesday and Thursdays and takes 75 mcg (1/2 tablet)) 45 tablet 1  . omeprazole (PRILOSEC) 20 MG capsule Take 20 mg by mouth every other day.    . Probiotic Product (PROBIOTIC PO) Take 1 tablet by mouth daily.     . rosuvastatin (CRESTOR) 20 MG tablet Take 1 tablet (20 mg total) by mouth daily.  (Patient taking differently: Take 20 mg by mouth every other day. ) 90 tablet 3   Current Facility-Administered Medications  Medication Dose Route Frequency Provider Last Rate Last Dose  . cyanocobalamin ((VITAMIN B-12)) injection 1,000 mcg  1,000 mcg Intramuscular Once Pieter Partridge, DO        Physical Exam BP 104/69 (BP Location: Right Arm, Patient Position: Sitting, Cuff Size: Large)   Pulse 63   Resp 16   Ht '5\' 11"'$  (1.803 m)   Wt 185 lb (83.9 kg)   SpO2 98% Comment: ON RA  BMI 25.39 kg/m  83 year old man in no acute distress Alert and oriented 3 with no focal deficits Lungs clear with equal breath as bilaterally Incisions healing well No peripheral edema  Diagnostic Tests: CHEST  2 VIEW  COMPARISON:  03/01/2017  FINDINGS: Loculated fluid along the left fissure, increased.  Mild subpleural reticulation/ scarring along the lateral left hemithorax.  No pneumothorax.  The heart is top-normal in size.  Prominent epicardial fat.  Surgical clips overlying the bilateral neck.  IMPRESSION: Small loculated left pleural effusion along the left fissure, increased.   Electronically Signed   By: Julian Hy M.D.   On: 03/09/2017 13:29 I personally reviewed the chest x-ray and concur with the findings noted above  Impression: 83 year old man who presented with a left pleural effusion and was found to have  malignant mesothelioma. He is recovering well from his thoracoscopic parietal pleurectomy.  There are no restrictions on his physical activities. He needs to wait at least another 2 weeks before attempting to drive. Appropriate precautions were discussed.  He needs a PET/CT for staging.  He needs an oncology consultation for treatment.  Plan: PET/CT Referred to [place] see Dr. Julien Nordmann regarding mesothelioma I will see him back in a month with a chest x-ray to check on his progress.  Melrose Nakayama, MD [facility] [phone]

## 2017-03-25 NOTE — Anesthesia Postprocedure Evaluation (Signed)
Anesthesia Post Note  Patient: Dean Pruitt  Procedure(s) Performed: Procedure(s) (LRB): INSERTION PORT-A-CATH, right internal jugular (N/A)  Patient location during evaluation: PACU Anesthesia Type: MAC Level of consciousness: awake and alert Pain management: pain level controlled Vital Signs Assessment: post-procedure vital signs reviewed and stable Respiratory status: spontaneous breathing, nonlabored ventilation, respiratory function stable and patient connected to nasal cannula oxygen Cardiovascular status: stable and blood pressure returned to baseline Anesthetic complications: no       Last Vitals:  Vitals:   03/25/17 1032 03/25/17 1033  BP:  133/83  Pulse: 63 63  Resp: 15 13  Temp:  36.1 C    Last Pain:  Vitals:   03/25/17 1033  PainSc: 0-No pain                 Tiajuana Amass

## 2017-03-25 NOTE — Interval H&P Note (Signed)
History and Physical Interval Note: Discussed indications, risks, benefits and alternative forms of IV access with [name]. He understands risks include but are not limited to bleeding, pneumothorax, cardiac perforation, infection, as well as other unforeseeable complications. He agrees to proceed. 03/25/2017 9:58 AM  Dean Pruitt  has presented today for surgery, with the diagnosis of MESOTHELIOMA  The various methods of treatment have been discussed with the patient and family. After consideration of risks, benefits and other options for treatment, the patient has consented to  Procedure(s): INSERTION PORT-A-CATH, right internal jugular (N/A) as a surgical intervention .  The patient's history has been reviewed, patient examined, no change in status, stable for surgery.  I have reviewed the patient's chart and labs.  Questions were answered to the patient's satisfaction.     Melrose Nakayama

## 2017-03-25 NOTE — Discharge Instructions (Signed)
Do not drive or engage in heavy physical activity for 24 hours.  You may resume normal activities and shower tomorrow  You should not drive within 6 hours of taking pain medication  There is a medical adhesive over the incisions. It will begin to peel off in 10-14 days  You have a prescription for oxycodone, a narcotic pain reliever, which you may use as directed  You may use acetaminophen (Tylenol) or ibuprofen (Advil, Motrin) in addition to, or instead of, the oxycodone

## 2017-03-26 ENCOUNTER — Encounter (HOSPITAL_COMMUNITY): Payer: Self-pay | Admitting: Thoracic Surgery (Cardiothoracic Vascular Surgery)

## 2017-03-29 ENCOUNTER — Encounter: Payer: Self-pay | Admitting: General Practice

## 2017-03-29 NOTE — Progress Notes (Signed)
[facility] Spiritual Care Note  Reached Dean Pruitt by phone after speaking with his wife last week per referral for bereavement support. He notes that they have good support from local family, friends, and church; are "taking it one day at a time," noting improvement each day; and are "doing very well, all things considered." Per pt, no need for additional support at this time, but glad to know of Support Team availability as desired.   [place], [place], [facility] Pager [phone] Voicemail [phone]

## 2017-03-30 LAB — ACID FAST CULTURE WITH REFLEXED SENSITIVITIES: ACID FAST CULTURE - AFSCU3: NEGATIVE

## 2017-03-30 LAB — ACID FAST CULTURE WITH REFLEXED SENSITIVITIES (MYCOBACTERIA)

## 2017-03-31 ENCOUNTER — Ambulatory Visit (HOSPITAL_BASED_OUTPATIENT_CLINIC_OR_DEPARTMENT_OTHER): Payer: PPO

## 2017-03-31 ENCOUNTER — Other Ambulatory Visit (HOSPITAL_BASED_OUTPATIENT_CLINIC_OR_DEPARTMENT_OTHER): Payer: PPO

## 2017-03-31 VITALS — BP 130/70 | HR 59 | Resp 18

## 2017-03-31 DIAGNOSIS — Z5111 Encounter for antineoplastic chemotherapy: Secondary | ICD-10-CM | POA: Diagnosis not present

## 2017-03-31 DIAGNOSIS — C450 Mesothelioma of pleura: Secondary | ICD-10-CM

## 2017-03-31 DIAGNOSIS — Z5112 Encounter for antineoplastic immunotherapy: Secondary | ICD-10-CM

## 2017-03-31 LAB — CBC WITH DIFFERENTIAL/PLATELET
BASO%: 0.1 % (ref 0.0–2.0)
BASOS ABS: 0 10*3/uL (ref 0.0–0.1)
EOS%: 0 % (ref 0.0–7.0)
Eosinophils Absolute: 0 10*3/uL (ref 0.0–0.5)
HCT: 44.4 % (ref 38.4–49.9)
HEMOGLOBIN: 14.7 g/dL (ref 13.0–17.1)
LYMPH%: 9.3 % — ABNORMAL LOW (ref 14.0–49.0)
MCH: 31.6 pg (ref 27.2–33.4)
MCHC: 33.2 g/dL (ref 32.0–36.0)
MCV: 95.2 fL (ref 79.3–98.0)
MONO#: 0.4 10*3/uL (ref 0.1–0.9)
MONO%: 3.8 % (ref 0.0–14.0)
NEUT#: 10.2 10*3/uL — ABNORMAL HIGH (ref 1.5–6.5)
NEUT%: 86.8 % — AB (ref 39.0–75.0)
Platelets: 258 10*3/uL (ref 140–400)
RBC: 4.66 10*6/uL (ref 4.20–5.82)
RDW: 13.8 % (ref 11.0–14.6)
WBC: 11.8 10*3/uL — ABNORMAL HIGH (ref 4.0–10.3)
lymph#: 1.1 10*3/uL (ref 0.9–3.3)

## 2017-03-31 LAB — COMPREHENSIVE METABOLIC PANEL
ALBUMIN: 3.8 g/dL (ref 3.5–5.0)
ALK PHOS: 77 U/L (ref 40–150)
ALT: 13 U/L (ref 0–55)
ANION GAP: 10 meq/L (ref 3–11)
AST: 15 U/L (ref 5–34)
BUN: 20.8 mg/dL (ref 7.0–26.0)
CHLORIDE: 103 meq/L (ref 98–109)
CO2: 22 mEq/L (ref 22–29)
CREATININE: 1 mg/dL (ref 0.7–1.3)
Calcium: 9.5 mg/dL (ref 8.4–10.4)
EGFR: 70 mL/min/{1.73_m2} — ABNORMAL LOW (ref >90)
Glucose: 140 mg/dl (ref 70–140)
Potassium: 4.7 mEq/L (ref 3.5–5.1)
Sodium: 135 mEq/L — ABNORMAL LOW (ref 136–145)
Total Bilirubin: 0.94 mg/dL (ref 0.20–1.20)
Total Protein: 6.8 g/dL (ref 6.4–8.3)

## 2017-03-31 LAB — UA PROTEIN, DIPSTICK - CHCC: Protein, ur: <30 mg/dL

## 2017-03-31 MED ORDER — SODIUM CHLORIDE 0.9 % IV SOLN
Freq: Once | INTRAVENOUS | Status: AC
  Administered 2017-03-31: 10:00:00 via INTRAVENOUS

## 2017-03-31 MED ORDER — SODIUM CHLORIDE 0.9 % IV SOLN
465.5000 mg | Freq: Once | INTRAVENOUS | Status: AC
  Administered 2017-03-31: 470 mg via INTRAVENOUS
  Filled 2017-03-31: qty 47

## 2017-03-31 MED ORDER — SODIUM CHLORIDE 0.9% FLUSH
10.0000 mL | INTRAVENOUS | Status: DC | PRN
  Administered 2017-03-31: 10 mL
  Filled 2017-03-31: qty 10

## 2017-03-31 MED ORDER — PALONOSETRON HCL INJECTION 0.25 MG/5ML
0.2500 mg | Freq: Once | INTRAVENOUS | Status: AC
  Administered 2017-03-31: 0.25 mg via INTRAVENOUS

## 2017-03-31 MED ORDER — SODIUM CHLORIDE 0.9 % IV SOLN
Freq: Once | INTRAVENOUS | Status: AC
  Administered 2017-03-31: 11:00:00 via INTRAVENOUS
  Filled 2017-03-31: qty 5

## 2017-03-31 MED ORDER — SODIUM CHLORIDE 0.9 % IV SOLN
475.0000 mg/m2 | Freq: Once | INTRAVENOUS | Status: AC
  Administered 2017-03-31: 1000 mg via INTRAVENOUS
  Filled 2017-03-31: qty 40

## 2017-03-31 MED ORDER — SODIUM CHLORIDE 0.9 % IV SOLN
14.5000 mg/kg | Freq: Once | INTRAVENOUS | Status: AC
  Administered 2017-03-31: 1300 mg via INTRAVENOUS
  Filled 2017-03-31: qty 4

## 2017-03-31 MED ORDER — PALONOSETRON HCL INJECTION 0.25 MG/5ML
INTRAVENOUS | Status: AC
  Filled 2017-03-31: qty 5

## 2017-03-31 MED ORDER — HEPARIN SOD (PORK) LOCK FLUSH 100 UNIT/ML IV SOLN
500.0000 [IU] | Freq: Once | INTRAVENOUS | Status: AC | PRN
  Administered 2017-03-31: 500 [IU]
  Filled 2017-03-31: qty 5

## 2017-03-31 NOTE — Patient Instructions (Signed)
[facility] Discharge Instructions for Patients Receiving Chemotherapy  Today you received the following chemotherapy agents Carboplatin, Avastin, Alimta  To help prevent nausea and vomiting after your treatment, we encourage you to take your nausea medication as needed   If you develop nausea and vomiting that is not controlled by your nausea medication, call the clinic.   BELOW ARE SYMPTOMS THAT SHOULD BE REPORTED IMMEDIATELY:  *FEVER GREATER THAN 100.5 F  *CHILLS WITH OR WITHOUT FEVER  NAUSEA AND VOMITING THAT IS NOT CONTROLLED WITH YOUR NAUSEA MEDICATION  *UNUSUAL SHORTNESS OF BREATH  *UNUSUAL BRUISING OR BLEEDING  TENDERNESS IN MOUTH AND THROAT WITH OR WITHOUT PRESENCE OF ULCERS  *URINARY PROBLEMS  *BOWEL PROBLEMS  UNUSUAL RASH Items with * indicate a potential emergency and should be followed up as soon as possible.  Feel free to call the clinic you have any questions or concerns. The clinic phone number is [phone].  Please show the [facility] at check-in to the Emergency Department and triage nurse.

## 2017-04-01 DIAGNOSIS — N3941 Urge incontinence: Secondary | ICD-10-CM | POA: Diagnosis not present

## 2017-04-02 ENCOUNTER — Other Ambulatory Visit: Payer: Self-pay | Admitting: Thoracic Surgery (Cardiothoracic Vascular Surgery)

## 2017-04-02 DIAGNOSIS — J90 Pleural effusion, not elsewhere classified: Secondary | ICD-10-CM

## 2017-04-06 ENCOUNTER — Ambulatory Visit
Admission: RE | Admit: 2017-04-06 | Discharge: 2017-04-06 | Disposition: A | Discharge status: Home / Self Care | Payer: PPO | Source: Ambulatory Visit | Attending: Thoracic Surgery (Cardiothoracic Vascular Surgery) | Admitting: Thoracic Surgery (Cardiothoracic Vascular Surgery)

## 2017-04-06 ENCOUNTER — Telehealth: Payer: Self-pay

## 2017-04-06 ENCOUNTER — Encounter: Payer: Self-pay | Admitting: Thoracic Surgery (Cardiothoracic Vascular Surgery)

## 2017-04-06 ENCOUNTER — Ambulatory Visit (INDEPENDENT_AMBULATORY_CARE_PROVIDER_SITE_OTHER): Payer: Self-pay | Admitting: Thoracic Surgery (Cardiothoracic Vascular Surgery)

## 2017-04-06 VITALS — BP 97/65 | HR 66 | Resp 16 | Ht 71.0 in | Wt 195.0 lb

## 2017-04-06 DIAGNOSIS — J90 Pleural effusion, not elsewhere classified: Secondary | ICD-10-CM | POA: Diagnosis not present

## 2017-04-06 DIAGNOSIS — Z09 Encounter for follow-up examination after completed treatment for conditions other than malignant neoplasm: Secondary | ICD-10-CM

## 2017-04-06 DIAGNOSIS — C459 Mesothelioma, unspecified: Secondary | ICD-10-CM

## 2017-04-06 NOTE — Telephone Encounter (Signed)
Left message asking pt to confirm PCP. Dean Pruitt ([place])

## 2017-04-06 NOTE — Telephone Encounter (Signed)
The patient's wife called me back and confirmed that Billey Gosling is the PCP whom is seen every year for a physical. V. McCain ([place])

## 2017-04-06 NOTE — Progress Notes (Signed)
[address]       [place] [address]             [phone]    HPI: Dean Pruitt returns for a scheduled follow-up visit.  He has [age]-year-old man who presented with recurring left pleural effusion. He had multiple large filling thoracenteses with negative cytologies. I did a left VATS and parietal pleurectomy on 02/15/2017. Pathology showed malignant mesothelioma. His postoperative course was, complicated by delirium.  I placed a Port-A-Cath on 03/25/2017. That was done as an outpatient. He did not have any issues after that.  He has received his first cycle of chemotherapy. He tolerated that well. He has occasional pain associated with his VATS incision. No pain associated with port.  Past Medical History:  Diagnosis Date  . Cataract   . Depression    situational  . GERD (gastroesophageal reflux disease)   . Goals of care, counseling/discussion 03/20/2017  . Hyperlipidemia   . Hypothyroidism   . Reactive depression 03/20/2017  . Thyroid cancer [facility])    PMH of; on supressive therapy    Current Outpatient Prescriptions  Medication Sig Dispense Refill  . aspirin 81 MG tablet Take 81 mg by mouth daily.    Marland Kitchen dexamethasone (DECADRON) 4 MG tablet 4 mg by mouth twice a day the day before, day of and day after the chemotherapy every 3 weeks. 40 tablet 1  . diphenhydrAMINE (BENADRYL) 25 MG tablet Take 50 mg by mouth at bedtime.     Marland Kitchen escitalopram (LEXAPRO) 10 MG tablet Take 1 tablet (10 mg total) by mouth at bedtime. 90 tablet 1  . folic acid (FOLVITE) 1 MG tablet Take 1 tablet (1 mg total) by mouth daily. 30 tablet 4  . gabapentin (NEURONTIN) 300 MG capsule TAKE ONE CAPSULE BY MOUTH AT NIGHT 30 capsule 3  . levothyroxine (SYNTHROID, LEVOTHROID) 150 MCG tablet TAKE 1 TABLET (150 mcg) BY MOUTH 3 days a week, 75 mcg 4 days a week (Patient taking differently: Take 75-150 mcg by mouth See admin instructions. 150 mcg every day except Tuesday and Thursdays and takes 75  mcg (1/2 tablet)) 45 tablet 1  . lidocaine-prilocaine (EMLA) cream Apply 1 application topically as needed. (Patient taking differently: Apply 1 application topically as needed (port access). ) 30 g 0  . omeprazole (PRILOSEC) 20 MG capsule Take 20 mg by mouth every other day.    . oxyCODONE (OXY IR/ROXICODONE) 5 MG immediate release tablet Take 1 tablet (5 mg total) by mouth every 6 (six) hours as needed for severe pain. 30 tablet 0  . Probiotic Product (PROBIOTIC PO) Take 1 tablet by mouth 2 (two) times daily.     . prochlorperazine (COMPAZINE) 10 MG tablet Take 1 tablet (10 mg total) by mouth every 6 (six) hours as needed for nausea or vomiting. 30 tablet 0  . rosuvastatin (CRESTOR) 20 MG tablet Take 1 tablet (20 mg total) by mouth daily. (Patient taking differently: Take 20 mg by mouth every other day. ) 90 tablet 3   No current facility-administered medications for this visit.     Physical Exam BP 97/65 (BP Location: Right Arm, Patient Position: Sitting, Cuff Size: Normal)   Pulse 66   Resp 16   Ht '5\' 11"'$  (1.803 m)   Wt 195 lb (88.5 kg)   SpO2 93% Comment: ON RA  BMI 27.63 kg/m  83 year old man in no acute distress Alert and oriented 3 with no focal deficits Incisions all  well-healed Lungs clear with equal breath sounds bilaterally  Diagnostic Tests: CHEST  2 VIEW  COMPARISON:  03/25/2017 . 03/25/2017. 02/16/2017. 02/12/2017. CT 02/12/2017.  FINDINGS: PowerPort catheter with lead tip projected over superior vena cava. Cardiomegaly with normal pulmonary vascularity. Mild left base subsegmental atelectasis and/or scarring. No significant pleural effusion. No pneumothorax.  IMPRESSION: 1.  PowerPort catheter noted in good anatomic position.  2.  Mild left base subsegmental atelectasis and/or scarring.   Electronically Signed   By: Marcello Moores  Register   On: 04/06/2017 13:19   Impression: Dean Pruitt is an 83 year old man recently diagnosed with malignant  mesothelioma. He has recovered well from his VATS. He did not have any issues related to his Port-A-Cath insertion.  As he will be followed closely by Dr. Julien Nordmann during his chemotherapy, he does not need to follow up with me at this time.   Melrose Nakayama, MD [facility] [phone]

## 2017-04-07 ENCOUNTER — Telehealth: Payer: Self-pay | Admitting: Internal Medicine

## 2017-04-07 ENCOUNTER — Other Ambulatory Visit (HOSPITAL_BASED_OUTPATIENT_CLINIC_OR_DEPARTMENT_OTHER): Payer: PPO

## 2017-04-07 ENCOUNTER — Encounter: Payer: Self-pay | Admitting: Internal Medicine

## 2017-04-07 ENCOUNTER — Ambulatory Visit (HOSPITAL_BASED_OUTPATIENT_CLINIC_OR_DEPARTMENT_OTHER): Payer: PPO | Admitting: Internal Medicine

## 2017-04-07 VITALS — BP 113/90 | HR 52 | Temp 98.8°F | Ht 71.0 in | Wt 193.1 lb

## 2017-04-07 DIAGNOSIS — J90 Pleural effusion, not elsewhere classified: Secondary | ICD-10-CM

## 2017-04-07 DIAGNOSIS — C450 Mesothelioma of pleura: Secondary | ICD-10-CM

## 2017-04-07 DIAGNOSIS — Z5111 Encounter for antineoplastic chemotherapy: Secondary | ICD-10-CM

## 2017-04-07 LAB — CBC WITH DIFFERENTIAL/PLATELET
BASO%: 0.3 % (ref 0.0–2.0)
Basophils Absolute: 0 10*3/uL (ref 0.0–0.1)
EOS ABS: 0.3 10*3/uL (ref 0.0–0.5)
EOS%: 5.1 % (ref 0.0–7.0)
HCT: 45 % (ref 38.4–49.9)
HEMOGLOBIN: 15.2 g/dL (ref 13.0–17.1)
LYMPH%: 25.7 % (ref 14.0–49.0)
MCH: 32.2 pg (ref 27.2–33.4)
MCHC: 33.9 g/dL (ref 32.0–36.0)
MCV: 95 fL (ref 79.3–98.0)
MONO#: 0.4 10*3/uL (ref 0.1–0.9)
MONO%: 5.6 % (ref 0.0–14.0)
NEUT%: 63.3 % (ref 39.0–75.0)
NEUTROS ABS: 4.1 10*3/uL (ref 1.5–6.5)
Platelets: 149 10*3/uL (ref 140–400)
RBC: 4.74 10*6/uL (ref 4.20–5.82)
RDW: 13.2 % (ref 11.0–14.6)
WBC: 6.4 10*3/uL (ref 4.0–10.3)
lymph#: 1.6 10*3/uL (ref 0.9–3.3)

## 2017-04-07 LAB — COMPREHENSIVE METABOLIC PANEL
ALBUMIN: 3.4 g/dL — AB (ref 3.5–5.0)
ALK PHOS: 70 U/L (ref 40–150)
ALT: 16 U/L (ref 0–55)
AST: 20 U/L (ref 5–34)
Anion Gap: 9 mEq/L (ref 3–11)
BILIRUBIN TOTAL: 1.02 mg/dL (ref 0.20–1.20)
BUN: 21.4 mg/dL (ref 7.0–26.0)
CO2: 24 meq/L (ref 22–29)
Calcium: 8.5 mg/dL (ref 8.4–10.4)
Chloride: 105 mEq/L (ref 98–109)
Creatinine: 0.8 mg/dL (ref 0.7–1.3)
EGFR: 83 mL/min/{1.73_m2} — AB (ref >90)
GLUCOSE: 90 mg/dL (ref 70–140)
Potassium: 4.4 mEq/L (ref 3.5–5.1)
Sodium: 137 mEq/L (ref 136–145)
TOTAL PROTEIN: 6.1 g/dL — AB (ref 6.4–8.3)

## 2017-04-07 NOTE — Telephone Encounter (Signed)
Gave patient avs report and appointments for May and June.  °

## 2017-04-07 NOTE — Progress Notes (Signed)
[facility] [phone]   Fax:(336) [phone]  OFFICE PROGRESS NOTE  [name], MD [place] [address]  DIAGNOSIS: stage II malignant pleural mesothelioma, epithelioid type involving the left hemithorax diagnosed in March 2018.  PRIOR THERAPY: Status post left VATS with biopsy and wedge resection of the left lower lobe as well as parietal pleurectomy under the care of Dr. Roxan Hockey on 02/15/2017.  CURRENT THERAPY: Palliative systemic chemotherapy with carboplatin for AUC of 5, Alimta 500 MG/M2 and Avastin 15 MG/KG every 3 weeks.  INTERVAL HISTORY: Dean Pruitt 83 y.o. male returns to the clinic today for follow-up visit accompanied by his wife. The patient is currently undergoing systemic chemotherapy with carboplatin, Alimta and Avastin status post 1 cycle. He tolerated the first week of his treatment well with no significant adverse effects. He denied having any fever or chills. He has no nausea, vomiting, diarrhea or constipation. He has no weight loss or night sweats. The patient denied having any chest pain but has occasional shortness breath with exertion was no cough or hemoptysis. He is here today for evaluation and repeat blood work.  MEDICAL HISTORY: Past Medical History:  Diagnosis Date  . Cataract   . Depression    situational  . GERD (gastroesophageal reflux disease)   . Goals of care, counseling/discussion 03/20/2017  . Hyperlipidemia   . Hypothyroidism   . Reactive depression 03/20/2017  . Thyroid cancer ([facility])    PMH of; on supressive therapy    ALLERGIES:  is allergic to ativan [lorazepam]; tramadol; and fentanyl.  MEDICATIONS:  Current Outpatient Prescriptions  Medication Sig Dispense Refill  . aspirin 81 MG tablet Take 81 mg by mouth daily.    Marland Kitchen dexamethasone (DECADRON) 4 MG tablet 4 mg by mouth twice a day the day before, day of and day after the chemotherapy every 3 weeks. 40 tablet 1  .  diphenhydrAMINE (BENADRYL) 25 MG tablet Take 50 mg by mouth at bedtime.     Marland Kitchen escitalopram (LEXAPRO) 10 MG tablet Take 1 tablet (10 mg total) by mouth at bedtime. 90 tablet 1  . folic acid (FOLVITE) 1 MG tablet Take 1 tablet (1 mg total) by mouth daily. 30 tablet 4  . gabapentin (NEURONTIN) 300 MG capsule TAKE ONE CAPSULE BY MOUTH AT NIGHT 30 capsule 3  . levothyroxine (SYNTHROID, LEVOTHROID) 150 MCG tablet TAKE 1 TABLET (150 mcg) BY MOUTH 3 days a week, 75 mcg 4 days a week (Patient taking differently: Take 75-150 mcg by mouth See admin instructions. 150 mcg every day except Tuesday and Thursdays and takes 75 mcg (1/2 tablet)) 45 tablet 1  . lidocaine-prilocaine (EMLA) cream Apply 1 application topically as needed. (Patient taking differently: Apply 1 application topically as needed (port access). ) 30 g 0  . omeprazole (PRILOSEC) 20 MG capsule Take 20 mg by mouth every other day.    . oxyCODONE (OXY IR/ROXICODONE) 5 MG immediate release tablet Take 1 tablet (5 mg total) by mouth every 6 (six) hours as needed for severe pain. 30 tablet 0  . Probiotic Product (PROBIOTIC PO) Take 1 tablet by mouth 2 (two) times daily.     . prochlorperazine (COMPAZINE) 10 MG tablet Take 1 tablet (10 mg total) by mouth every 6 (six) hours as needed for nausea or vomiting. 30 tablet 0  . rosuvastatin (CRESTOR) 20 MG tablet Take 1 tablet (20 mg total) by mouth daily. (Patient taking differently: Take 20 mg by  mouth every other day. ) 90 tablet 3   No current facility-administered medications for this visit.     SURGICAL HISTORY:  Past Surgical History:  Procedure Laterality Date  . CATARACT EXTRACTION, BILATERAL  2013 & 2014   Dr Celene Squibb  . colon polyps  2002 & 2005   negative 2008; Dr Earlean Shawl  . COLONOSCOPY W/ POLYPECTOMY    . LUNG BIOPSY Left 02/15/2017   Procedure: LUNG BIOPSY;  Surgeon: Melrose Nakayama, MD;  Location: [facility];  Service: Thoracic;  Laterality: Left;  . PLEURAL EFFUSION DRAINAGE Left 02/15/2017    Procedure: DRAINAGE OF PLEURAL EFFUSION;  Surgeon: Melrose Nakayama, MD;  Location: [facility];  Service: Thoracic;  Laterality: Left;  . PORTACATH PLACEMENT N/A 03/25/2017   Procedure: INSERTION PORT-A-CATH, right internal jugular;  Surgeon: Melrose Nakayama, MD;  Location: [facility];  Service: Thoracic;  Laterality: N/A;  . THYROIDECTOMY  2001   S/P RAI  . VIDEO ASSISTED THORACOSCOPY Left 02/15/2017   Procedure: VIDEO ASSISTED THORACOSCOPY;  Surgeon: Melrose Nakayama, MD;  Location: [facility];  Service: Thoracic;  Laterality: Left;    REVIEW OF SYSTEMS:  A comprehensive review of systems was negative except for: Respiratory: positive for dyspnea on exertion   PHYSICAL EXAMINATION: General appearance: alert, cooperative and no distress Head: Normocephalic, without obvious abnormality, atraumatic Neck: no adenopathy, no JVD, supple, symmetrical, trachea midline and thyroid not enlarged, symmetric, no tenderness/mass/nodules Lymph nodes: Cervical, supraclavicular, and axillary nodes normal. Resp: clear to auscultation bilaterally Back: symmetric, no curvature. ROM normal. No CVA tenderness. Cardio: regular rate and rhythm, S1, S2 normal, no murmur, click, rub or gallop GI: soft, non-tender; bowel sounds normal; no masses,  no organomegaly Extremities: extremities normal, atraumatic, no cyanosis or edema  ECOG PERFORMANCE STATUS: 1 - Symptomatic but completely ambulatory  Blood pressure 113/90, pulse (!) 52, temperature 98.8 F (37.1 C), temperature source Oral, height '5\' 11"'$  (1.803 m), weight 193 lb 1.6 oz (87.6 kg), SpO2 100 %.  LABORATORY DATA: Lab Results  Component Value Date   WBC 6.4 04/07/2017   HGB 15.2 04/07/2017   HCT 45.0 04/07/2017   MCV 95.0 04/07/2017   PLT 149 04/07/2017      Chemistry      Component Value Date/Time   NA 137 04/07/2017 0959   K 4.4 04/07/2017 0959   CL 103 03/25/2017 0618   CO2 24 04/07/2017 0959   BUN 21.4 04/07/2017 0959   CREATININE 0.8  04/07/2017 0959      Component Value Date/Time   CALCIUM 8.5 04/07/2017 0959   ALKPHOS 70 04/07/2017 0959   AST 20 04/07/2017 0959   ALT 16 04/07/2017 0959   BILITOT 1.02 04/07/2017 0959       RADIOGRAPHIC STUDIES: Dg Chest 2 View  Result Date: 04/06/2017 CLINICAL DATA:  Left pleural effusion.  Left chest discomfort. EXAM: CHEST  2 VIEW COMPARISON:  03/25/2017 . 03/25/2017. 02/16/2017. 02/12/2017. CT 02/12/2017. FINDINGS: PowerPort catheter with lead tip projected over superior vena cava. Cardiomegaly with normal pulmonary vascularity. Mild left base subsegmental atelectasis and/or scarring. No significant pleural effusion. No pneumothorax. IMPRESSION: 1.  PowerPort catheter noted in good anatomic position. 2.  Mild left base subsegmental atelectasis and/or scarring. Electronically Signed   By: Marcello Moores  Register   On: 04/06/2017 13:19   Dg Chest 2 View  Result Date: 03/25/2017 CLINICAL DATA:  Preoperative examination prior Port-A-Cath placement. EXAM: CHEST  2 VIEW COMPARISON:  PET-CT study of March 17, 2017 and chest x-ray of  March 09, 2017 FINDINGS: The lungs are well-expanded. There is persistent density at the left lung base with a trace of pleural fluid. The right lung clear. The heart and pulmonary vascularity are normal. There is calcification in the wall of the aortic arch. The bony thorax exhibits no acute abnormality. IMPRESSION: Known pleural and parenchymal changes at the left lung base worrisome for malignancy. No acute cardiopulmonary abnormality otherwise. Thoracic aortic atherosclerosis. Electronically Signed   By: David  Martinique M.D.   On: 03/25/2017 07:40   Dg Chest 2 View  Result Date: 03/09/2017 CLINICAL DATA:  Pleural effusion, status post left VATS on 02/15/2017 EXAM: CHEST  2 VIEW COMPARISON:  03/01/2017 FINDINGS: Loculated fluid along the left fissure, increased. Mild subpleural reticulation/ scarring along the lateral left hemithorax. No pneumothorax. The heart is  top-normal in size.  Prominent epicardial fat. Surgical clips overlying the bilateral neck. IMPRESSION: Small loculated left pleural effusion along the left fissure, increased. Electronically Signed   By: Julian Hy M.D.   On: 03/09/2017 13:29   Nm Pet Image Initial (pi) Skull Base To Thigh  Result Date: 03/17/2017 CLINICAL DATA:  Initial treatment strategy for mesothelioma. EXAM: NUCLEAR MEDICINE PET SKULL BASE TO THIGH TECHNIQUE: 9.3 mCi F-18 FDG was injected intravenously. Full-ring PET imaging was performed from the skull base to thigh after the radiotracer. CT data was obtained and used for attenuation correction and anatomic localization. FASTING BLOOD GLUCOSE:  Value: 118 mg/dl COMPARISON:  CT chest 02/12/2017 FINDINGS: NECK No hypermetabolic lymph nodes in the neck. Bilateral carotid atherosclerotic calcifications. Prior thyroidectomy. CHEST Thin but hypermetabolic pleural rind on the right with some nodularity along the pleural border of the right pericardial tissue. Local nodularity in this vicinity measures 1.1 cm in thickness on image 76/4 and has a maximum SUV of 5.8. Hypermetabolic pleural tissue tracks along the descending thoracic aorta margin, maximum SUV in this region 5.5. Trace residual left pleural effusion. A left upper paratracheal lymph node just below the thoracic inlet in just below the thyroidectomy clips measures 0.9 cm in short axis on image 54/4, with maximum SUV 11.6, compatible with malignant involvement. No involvement of the right hemithorax identified. A right lower paratracheal node measures 1.1 cm in short axis on image 69/4 and has maximum SUV of 3.2, similar to background blood pool activity. Coronary, aortic arch, and branch vessel atherosclerotic vascular disease. Mild cardiomegaly. Faintly metabolic track along the left lower chest wall probably relates to recent chest tube. ABDOMEN/PELVIS No abnormal hypermetabolic activity within the liver, pancreas, adrenal  glands, or spleen. No hypermetabolic lymph nodes in the abdomen or pelvis. 2 mm left kidney lower pole nonobstructive renal calculus, image 137/4. Ectatic abdominal aorta at 2.9 cm. Aortoiliac atherosclerotic vascular disease. Fatty spermatic cords. Sigmoid colon diverticulosis. Mildly enlarged prostate gland. SKELETON No focal hypermetabolic activity to suggest skeletal metastasis. IMPRESSION: 1. Hypermetabolic activity along the left pleural surface. Although some of this could certainly be from recent inflammation, there is also some focal nodularity for example along the upper pericardial margin on image 76/4 which is hypermetabolic and suspicious for mesothelioma. 2. There is also an upper mediastinal lymph node, just below the thoracic inlet, which is hypermetabolic compatible with malignant involvement. 3. No involvement of the abdomen, pelvis, skeleton, or neck. 4. Other imaging findings of potential clinical significance: Atherosclerosis as noted above. Mild cardiomegaly. Ectatic abdominal aorta. Nonobstructive left nephrolithiasis. Mildly enlarged prostate gland. Electronically Signed   By: Van Clines M.D.   On: 03/17/2017 13:31  Dg Chest Port 1 View  Result Date: 03/25/2017 CLINICAL DATA:  Central line placement. EXAM: PORTABLE CHEST 1 VIEW COMPARISON:  03/25/2017.  PET-CT 03/17/2017. FINDINGS: PowerPort catheter noted with lead tip projected over superior vena cava. Cardiomegaly with normal pulmonary vascularity. Known left pleural changes are unchanged. Reference made to PET-CT report 03/17/2017. No pneumothorax. Surgical clips noted over the upper chest. IMPRESSION: 1. PowerPort catheter noted with tip over the lower superior vena cava . 2. Known left pleural process is unchanged from prior exam. Reference made to PET-CT report of 03/17/2017. No acute abnormality identified. Electronically Signed   By: Marcello Moores  Register   On: 03/25/2017 10:27   Dg Cyndy Freeze Guide Cv Line-no Report  Result  Date: 03/25/2017 Fluoroscopy was utilized by the requesting physician.  No radiographic interpretation.    ASSESSMENT AND PLAN: This is a very pleasant 83 years old white male with malignant pleural mesothelioma involving the left hemithorax. The patient is currently undergoing systemic chemotherapy with carboplatin, Alimta and Avastin status post 1 cycle. He tolerated the first cycle well. His lab work is unremarkable today. I recommended for the patient to continue his treatment as a scheduled and he would come back for follow-up visit in 2 weeks with the start of cycle #2. He was advised to call immediately if he has any concerning symptoms in the interval. The patient voices understanding of current disease status and treatment options and is in agreement with the current care plan. All questions were answered. The patient knows to call the clinic with any problems, questions or concerns. We can certainly see the patient much sooner if necessary. I spent 10 minutes counseling the patient face to face. The total time spent in the appointment was 15 minutes.  Disclaimer: This note was dictated with voice recognition software. Similar sounding words can inadvertently be transcribed and may not be corrected upon review.

## 2017-04-12 DIAGNOSIS — H52203 Unspecified astigmatism, bilateral: Secondary | ICD-10-CM | POA: Diagnosis not present

## 2017-04-12 DIAGNOSIS — H1820 Unspecified corneal edema: Secondary | ICD-10-CM | POA: Diagnosis not present

## 2017-04-12 DIAGNOSIS — H26493 Other secondary cataract, bilateral: Secondary | ICD-10-CM | POA: Diagnosis not present

## 2017-04-14 ENCOUNTER — Other Ambulatory Visit (HOSPITAL_BASED_OUTPATIENT_CLINIC_OR_DEPARTMENT_OTHER): Payer: PPO

## 2017-04-14 DIAGNOSIS — C450 Mesothelioma of pleura: Secondary | ICD-10-CM

## 2017-04-14 LAB — COMPREHENSIVE METABOLIC PANEL
ALT: 27 U/L (ref 0–55)
ANION GAP: 6 meq/L (ref 3–11)
AST: 27 U/L (ref 5–34)
Albumin: 3.5 g/dL (ref 3.5–5.0)
Alkaline Phosphatase: 66 U/L (ref 40–150)
BUN: 14.9 mg/dL (ref 7.0–26.0)
CHLORIDE: 108 meq/L (ref 98–109)
CO2: 26 meq/L (ref 22–29)
Calcium: 8.9 mg/dL (ref 8.4–10.4)
Creatinine: 1 mg/dL (ref 0.7–1.3)
EGFR: 68 mL/min/{1.73_m2} — AB (ref >90)
GLUCOSE: 139 mg/dL (ref 70–140)
Potassium: 4.2 mEq/L (ref 3.5–5.1)
SODIUM: 140 meq/L (ref 136–145)
TOTAL PROTEIN: 6.3 g/dL — AB (ref 6.4–8.3)
Total Bilirubin: 0.66 mg/dL (ref 0.20–1.20)

## 2017-04-14 LAB — CBC WITH DIFFERENTIAL/PLATELET
BASO%: 0.3 % (ref 0.0–2.0)
Basophils Absolute: 0 10*3/uL (ref 0.0–0.1)
EOS ABS: 0.1 10*3/uL (ref 0.0–0.5)
EOS%: 2.6 % (ref 0.0–7.0)
HCT: 43.4 % (ref 38.4–49.9)
HGB: 14.4 g/dL (ref 13.0–17.1)
LYMPH%: 31.7 % (ref 14.0–49.0)
MCH: 32 pg (ref 27.2–33.4)
MCHC: 33.2 g/dL (ref 32.0–36.0)
MCV: 96.5 fL (ref 79.3–98.0)
MONO#: 0.3 10*3/uL (ref 0.1–0.9)
MONO%: 7 % (ref 0.0–14.0)
NEUT%: 58.4 % (ref 39.0–75.0)
NEUTROS ABS: 2.6 10*3/uL (ref 1.5–6.5)
PLATELETS: 139 10*3/uL — AB (ref 140–400)
RBC: 4.5 10*6/uL (ref 4.20–5.82)
RDW: 13.9 % (ref 11.0–14.6)
WBC: 4.4 10*3/uL (ref 4.0–10.3)
lymph#: 1.4 10*3/uL (ref 0.9–3.3)

## 2017-04-18 ENCOUNTER — Other Ambulatory Visit: Payer: Self-pay | Admitting: Neurology

## 2017-04-19 NOTE — Telephone Encounter (Signed)
Dr Tomi Likens,  Is it ok to refill this med? Looks like he was suppose to follow up in 6 months but does not have a office visit scheduled  Thanks  Shirlean Mylar

## 2017-04-21 ENCOUNTER — Ambulatory Visit (HOSPITAL_BASED_OUTPATIENT_CLINIC_OR_DEPARTMENT_OTHER): Payer: PPO | Admitting: Internal Medicine

## 2017-04-21 ENCOUNTER — Ambulatory Visit (HOSPITAL_BASED_OUTPATIENT_CLINIC_OR_DEPARTMENT_OTHER): Payer: PPO

## 2017-04-21 ENCOUNTER — Ambulatory Visit: Payer: PPO

## 2017-04-21 ENCOUNTER — Encounter: Payer: Self-pay | Admitting: Internal Medicine

## 2017-04-21 ENCOUNTER — Telehealth: Payer: Self-pay | Admitting: Internal Medicine

## 2017-04-21 ENCOUNTER — Other Ambulatory Visit (HOSPITAL_BASED_OUTPATIENT_CLINIC_OR_DEPARTMENT_OTHER): Payer: PPO

## 2017-04-21 VITALS — BP 137/78

## 2017-04-21 VITALS — BP 142/99 | HR 69 | Temp 98.2°F | Resp 18 | Ht 71.0 in | Wt 198.3 lb

## 2017-04-21 DIAGNOSIS — Z5112 Encounter for antineoplastic immunotherapy: Secondary | ICD-10-CM | POA: Diagnosis not present

## 2017-04-21 DIAGNOSIS — Z95828 Presence of other vascular implants and grafts: Secondary | ICD-10-CM

## 2017-04-21 DIAGNOSIS — C450 Mesothelioma of pleura: Secondary | ICD-10-CM

## 2017-04-21 DIAGNOSIS — Z5111 Encounter for antineoplastic chemotherapy: Secondary | ICD-10-CM

## 2017-04-21 LAB — CBC WITH DIFFERENTIAL/PLATELET
BASO%: 0.2 % (ref 0.0–2.0)
BASOS ABS: 0 10*3/uL (ref 0.0–0.1)
EOS ABS: 0 10*3/uL (ref 0.0–0.5)
EOS%: 0 % (ref 0.0–7.0)
HCT: 42.3 % (ref 38.4–49.9)
HEMOGLOBIN: 14.4 g/dL (ref 13.0–17.1)
LYMPH#: 1 10*3/uL (ref 0.9–3.3)
LYMPH%: 15.7 % (ref 14.0–49.0)
MCH: 32.2 pg (ref 27.2–33.4)
MCHC: 34 g/dL (ref 32.0–36.0)
MCV: 94.6 fL (ref 79.3–98.0)
MONO#: 0.4 10*3/uL (ref 0.1–0.9)
MONO%: 7 % (ref 0.0–14.0)
NEUT%: 77.1 % — ABNORMAL HIGH (ref 39.0–75.0)
NEUTROS ABS: 4.8 10*3/uL (ref 1.5–6.5)
Platelets: 239 10*3/uL (ref 140–400)
RBC: 4.47 10*6/uL (ref 4.20–5.82)
RDW: 13.9 % (ref 11.0–14.6)
WBC: 6.2 10*3/uL (ref 4.0–10.3)

## 2017-04-21 LAB — COMPREHENSIVE METABOLIC PANEL
ALT: 35 U/L (ref 0–55)
AST: 29 U/L (ref 5–34)
Albumin: 3.9 g/dL (ref 3.5–5.0)
Alkaline Phosphatase: 59 U/L (ref 40–150)
Anion Gap: 11 mEq/L (ref 3–11)
BILIRUBIN TOTAL: 0.71 mg/dL (ref 0.20–1.20)
BUN: 23.3 mg/dL (ref 7.0–26.0)
CO2: 23 meq/L (ref 22–29)
Calcium: 9.6 mg/dL (ref 8.4–10.4)
Chloride: 100 mEq/L (ref 98–109)
Creatinine: 0.9 mg/dL (ref 0.7–1.3)
EGFR: 77 mL/min/{1.73_m2} — AB (ref >90)
GLUCOSE: 136 mg/dL (ref 70–140)
POTASSIUM: 4.6 meq/L (ref 3.5–5.1)
SODIUM: 133 meq/L — AB (ref 136–145)
TOTAL PROTEIN: 6.8 g/dL (ref 6.4–8.3)

## 2017-04-21 LAB — UA PROTEIN, DIPSTICK - CHCC: Protein, ur: <30 mg/dL

## 2017-04-21 MED ORDER — HEPARIN SOD (PORK) LOCK FLUSH 100 UNIT/ML IV SOLN
500.0000 [IU] | Freq: Once | INTRAVENOUS | Status: AC | PRN
  Administered 2017-04-21: 500 [IU]
  Filled 2017-04-21: qty 5

## 2017-04-21 MED ORDER — SODIUM CHLORIDE 0.9 % IV SOLN
Freq: Once | INTRAVENOUS | Status: AC
  Administered 2017-04-21: 13:00:00 via INTRAVENOUS

## 2017-04-21 MED ORDER — SODIUM CHLORIDE 0.9 % IV SOLN
499.5000 mg | Freq: Once | INTRAVENOUS | Status: AC
  Administered 2017-04-21: 500 mg via INTRAVENOUS
  Filled 2017-04-21: qty 50

## 2017-04-21 MED ORDER — SODIUM CHLORIDE 0.9 % IV SOLN: 15.2000 mg/kg | Freq: Once | INTRAVENOUS | Status: DC

## 2017-04-21 MED ORDER — SODIUM CHLORIDE 0.9% FLUSH
10.0000 mL | INTRAVENOUS | Status: DC | PRN
  Administered 2017-04-21: 10 mL
  Filled 2017-04-21: qty 10

## 2017-04-21 MED ORDER — SODIUM CHLORIDE 0.9% FLUSH
10.0000 mL | INTRAVENOUS | Status: DC | PRN
  Administered 2017-04-21: 10 mL via INTRAVENOUS
  Filled 2017-04-21: qty 10

## 2017-04-21 MED ORDER — SODIUM CHLORIDE 0.9 % IV SOLN
475.0000 mg/m2 | Freq: Once | INTRAVENOUS | Status: AC
  Administered 2017-04-21: 1000 mg via INTRAVENOUS
  Filled 2017-04-21: qty 40

## 2017-04-21 MED ORDER — SODIUM CHLORIDE 0.9 % IV SOLN
Freq: Once | INTRAVENOUS | Status: AC
  Administered 2017-04-21: 13:00:00 via INTRAVENOUS
  Filled 2017-04-21: qty 5

## 2017-04-21 MED ORDER — PALONOSETRON HCL INJECTION 0.25 MG/5ML
INTRAVENOUS | Status: AC
  Filled 2017-04-21: qty 5

## 2017-04-21 MED ORDER — BEVACIZUMAB CHEMO INJECTION 400 MG/16ML
14.5000 mg/kg | Freq: Once | INTRAVENOUS | Status: AC
  Administered 2017-04-21: 1300 mg via INTRAVENOUS
  Filled 2017-04-21: qty 48

## 2017-04-21 MED ORDER — PALONOSETRON HCL INJECTION 0.25 MG/5ML
0.2500 mg | Freq: Once | INTRAVENOUS | Status: AC
  Administered 2017-04-21: 0.25 mg via INTRAVENOUS

## 2017-04-21 MED ORDER — SODIUM CHLORIDE 0.9 % IV SOLN: 14.5000 mg/kg | Freq: Once | INTRAVENOUS | Status: DC

## 2017-04-21 NOTE — Telephone Encounter (Signed)
Gave patient AVS and calender per 5/16 los.

## 2017-04-21 NOTE — Patient Instructions (Signed)
[facility] Discharge Instructions for Patients Receiving Chemotherapy  Today you received the following chemotherapy agents bevacizumab (Avastin), pemetrexed (Alimta), and carboplatin (Paraplatin)  To help prevent nausea and vomiting after your treatment, we encourage you to take your nausea medication as directed by your doctor.   If you develop nausea and vomiting that is not controlled by your nausea medication, call the clinic.   BELOW ARE SYMPTOMS THAT SHOULD BE REPORTED IMMEDIATELY:  *FEVER GREATER THAN 100.5 F  *CHILLS WITH OR WITHOUT FEVER  NAUSEA AND VOMITING THAT IS NOT CONTROLLED WITH YOUR NAUSEA MEDICATION  *UNUSUAL SHORTNESS OF BREATH  *UNUSUAL BRUISING OR BLEEDING  TENDERNESS IN MOUTH AND THROAT WITH OR WITHOUT PRESENCE OF ULCERS  *URINARY PROBLEMS  *BOWEL PROBLEMS  UNUSUAL RASH Items with * indicate a potential emergency and should be followed up as soon as possible.  Feel free to call the clinic you have any questions or concerns. The clinic phone number is [phone].  Please show the CHEMO ALERT CARD at check-in to the Emergency Department and triage nurse.   

## 2017-04-21 NOTE — Patient Instructions (Signed)

## 2017-04-21 NOTE — Progress Notes (Signed)
Dean Pruitt [phone]   [phone]  OFFICE PROGRESS NOTE  [name], MD [place] [address]  DIAGNOSIS: stage II malignant pleural mesothelioma, epithelioid type involving the left hemithorax diagnosed in March 2018.  PRIOR THERAPY: Status post left VATS with biopsy and wedge resection of the left lower lobe as well as parietal pleurectomy under the care of Dr. Roxan Hockey on 02/15/2017.  CURRENT THERAPY: Palliative systemic chemotherapy with carboplatin for AUC of 5, Alimta 500 MG/M2 and Avastin 15 MG/KG every 3 weeks.  INTERVAL HISTORY: Dean Pruitt 83 y.o. male returns to the clinic today for follow-up visit accompanied by his wife. The patient is currently undergoing systemic chemotherapy with carboplatin, Alimta and Avastin status post 1 cycle. He tolerated the first cycle of his treatment well with no significant adverse effects. He denied having any fever or chills. He denied having any nausea or vomiting. He denied having any chest pain, shortness of breath, cough or hemoptysis. He is here today for evaluation before starting cycle #2.  MEDICAL HISTORY: Past Medical History:  Diagnosis Date  . Cataract   . Depression    situational  . GERD (gastroesophageal reflux disease)   . Goals of care, counseling/discussion 03/20/2017  . Hyperlipidemia   . Hypothyroidism   . Reactive depression 03/20/2017  . Thyroid cancer ([place])    PMH of; on supressive therapy    ALLERGIES:  is allergic to ativan [lorazepam]; tramadol; and fentanyl.  MEDICATIONS:  Current Outpatient Prescriptions  Medication Sig Dispense Refill  . aspirin 81 MG tablet Take 81 mg by mouth daily.    Marland Kitchen dexamethasone (DECADRON) 4 MG tablet 4 mg by mouth twice a day the day before, day of and day after the chemotherapy every 3 weeks. 40 tablet 1  . diphenhydrAMINE (BENADRYL) 25 MG tablet Take 50 mg by mouth at bedtime.     Marland Kitchen escitalopram (LEXAPRO)  10 MG tablet Take 1 tablet (10 mg total) by mouth at bedtime. 90 tablet 1  . folic acid (FOLVITE) 1 MG tablet Take 1 tablet (1 mg total) by mouth daily. 30 tablet 4  . gabapentin (NEURONTIN) 300 MG capsule TAKE ONE CAPSULE BY MOUTH AT NIGHT 30 capsule 3  . levothyroxine (SYNTHROID, LEVOTHROID) 150 MCG tablet TAKE 1 TABLET (150 mcg) BY MOUTH 3 days a week, 75 mcg 4 days a week (Patient taking differently: Take 75-150 mcg by mouth See admin instructions. 150 mcg every day except Tuesday and Thursdays and takes 75 mcg (1/2 tablet)) 45 tablet 1  . lidocaine-prilocaine (EMLA) cream Apply 1 application topically as needed. (Patient taking differently: Apply 1 application topically as needed (port access). ) 30 g 0  . omeprazole (PRILOSEC) 20 MG capsule Take 20 mg by mouth every other day.    . Probiotic Product (PROBIOTIC PO) Take 1 tablet by mouth 2 (two) times daily.     . rosuvastatin (CRESTOR) 20 MG tablet Take 1 tablet (20 mg total) by mouth daily. (Patient taking differently: Take 20 mg by mouth every other day. ) 90 tablet 3  . oxyCODONE (OXY IR/ROXICODONE) 5 MG immediate release tablet Take 1 tablet (5 mg total) by mouth every 6 (six) hours as needed for severe pain. (Patient not taking: Reported on 04/21/2017) 30 tablet 0  . prochlorperazine (COMPAZINE) 10 MG tablet Take 1 tablet (10 mg total) by mouth every 6 (six) hours as needed for nausea or vomiting. (Patient not taking: Reported on  04/21/2017) 30 tablet 0   No current facility-administered medications for this visit.     SURGICAL HISTORY:  Past Surgical History:  Procedure Laterality Date  . CATARACT EXTRACTION, BILATERAL  2013 & 2014   Dr Celene Squibb  . colon polyps  2002 & 2005   negative 2008; Dr Earlean Shawl  . COLONOSCOPY W/ POLYPECTOMY    . LUNG BIOPSY Left 02/15/2017   Procedure: LUNG BIOPSY;  Surgeon: Melrose Nakayama, MD;  Location: [facility];  Service: Thoracic;  Laterality: Left;  . PLEURAL EFFUSION DRAINAGE Left 02/15/2017   Procedure:  DRAINAGE OF PLEURAL EFFUSION;  Surgeon: Melrose Nakayama, MD;  Location: [facility];  Service: Thoracic;  Laterality: Left;  . PORTACATH PLACEMENT N/A 03/25/2017   Procedure: INSERTION PORT-A-CATH, right internal jugular;  Surgeon: Melrose Nakayama, MD;  Location: [facility];  Service: Thoracic;  Laterality: N/A;  . THYROIDECTOMY  2001   S/P RAI  . VIDEO ASSISTED THORACOSCOPY Left 02/15/2017   Procedure: VIDEO ASSISTED THORACOSCOPY;  Surgeon: Melrose Nakayama, MD;  [facility];  Service: Thoracic;  Laterality: Left;    REVIEW OF SYSTEMS:  A comprehensive review of systems was negative.   PHYSICAL EXAMINATION: General appearance: alert, cooperative and no distress Head: Normocephalic, without obvious abnormality, atraumatic Neck: no adenopathy, no JVD, supple, symmetrical, trachea midline and thyroid not enlarged, symmetric, no tenderness/mass/nodules Lymph nodes: Cervical, supraclavicular, and axillary nodes normal. Resp: clear to auscultation bilaterally Back: symmetric, no curvature. ROM normal. No CVA tenderness. Cardio: regular rate and rhythm, S1, S2 normal, no murmur, click, rub or gallop GI: soft, non-tender; bowel sounds normal; no masses,  no organomegaly Extremities: extremities normal, atraumatic, no cyanosis or edema  ECOG PERFORMANCE STATUS: 1 - Symptomatic but completely ambulatory  Blood pressure (!) 142/99, pulse 69, temperature 98.2 F (36.8 C), temperature source Oral, resp. rate 18, height '5\' 11"'$  (1.803 m), weight 198 lb 4.8 oz (89.9 kg), SpO2 96 %.  LABORATORY DATA: Lab Results  Component Value Date   WBC 6.2 04/21/2017   HGB 14.4 04/21/2017   HCT 42.3 04/21/2017   MCV 94.6 04/21/2017   PLT 239 04/21/2017      Chemistry      Component Value Date/Time   NA 133 (L) 04/21/2017 1028   K 4.6 04/21/2017 1028   CL 103 03/25/2017 0618   CO2 23 04/21/2017 1028   BUN 23.3 04/21/2017 1028   CREATININE 0.9 04/21/2017 1028      Component Value Date/Time    CALCIUM 9.6 04/21/2017 1028   ALKPHOS 59 04/21/2017 1028   AST 29 04/21/2017 1028   ALT 35 04/21/2017 1028   BILITOT 0.71 04/21/2017 1028       RADIOGRAPHIC STUDIES: Dg Chest 2 View  Result Date: 04/06/2017 CLINICAL DATA:  Left pleural effusion.  Left chest discomfort. EXAM: CHEST  2 VIEW COMPARISON:  03/25/2017 . 03/25/2017. 02/16/2017. 02/12/2017. CT 02/12/2017. FINDINGS: PowerPort catheter with lead tip projected over superior vena cava. Cardiomegaly with normal pulmonary vascularity. Mild left base subsegmental atelectasis and/or scarring. No significant pleural effusion. No pneumothorax. IMPRESSION: 1.  PowerPort catheter noted in good anatomic position. 2.  Mild left base subsegmental atelectasis and/or scarring. Electronically Signed   By: Marcello Moores  Register   On: 04/06/2017 13:19   Dg Chest 2 View  Result Date: 03/25/2017 CLINICAL DATA:  Preoperative examination prior Port-A-Cath placement. EXAM: CHEST  2 VIEW COMPARISON:  PET-CT study of March 17, 2017 and chest x-ray of March 09, 2017 FINDINGS: The lungs are well-expanded.  There is persistent density at the left lung base with a trace of pleural fluid. The right lung clear. The heart and pulmonary vascularity are normal. There is calcification in the wall of the aortic arch. The bony thorax exhibits no acute abnormality. IMPRESSION: Known pleural and parenchymal changes at the left lung base worrisome for malignancy. No acute cardiopulmonary abnormality otherwise. Thoracic aortic atherosclerosis. Electronically Signed   By: David  Martinique M.D.   On: 03/25/2017 07:40   [place] Limited Joint Space Structures Low Right  Result Date: 04/09/2017 Procedure: Real-time Ultrasound Guided Injection of left  greater trochanteric bursitis secondary to patient's body habitus Device: GE Logiq Q7 Ultrasound guided injection is preferred based studies that show increased duration, increased effect, greater accuracy, decreased procedural pain, increased response  rate, and decreased cost with ultrasound guided versus blind injection. Verbal informed consent obtained. Time-out conducted. Noted no overlying erythema, induration, or other signs of local infection. Skin prepped in a sterile fashion. Local anesthesia: Topical Ethyl chloride. With sterile technique and under real time ultrasound guidance:  Greater trochanteric area was visualized and patient's bursa was noted. A 22-gauge 3 inch needle was inserted and 4 cc of 0.5% Marcaine and 1 cc of Kenalog 40 mg/dL was injected. Pictures taken Completed without difficulty Pain immediately resolved suggesting accurate placement of the medication. Advised to call if fevers/chills, erythema, induration, drainage, or persistent bleeding. Images permanently stored and available for review in the ultrasound unit. Impression: Technically successful ultrasound guided injection  Dg Chest Port 1 View  Result Date: 03/25/2017 CLINICAL DATA:  Central line placement. EXAM: PORTABLE CHEST 1 VIEW COMPARISON:  03/25/2017.  PET-CT 03/17/2017. FINDINGS: PowerPort catheter noted with lead tip projected over superior vena cava. Cardiomegaly with normal pulmonary vascularity. Known left pleural changes are unchanged. Reference made to PET-CT report 03/17/2017. No pneumothorax. Surgical clips noted over the upper chest. IMPRESSION: 1. PowerPort catheter noted with tip over the lower superior vena cava . 2. Known left pleural process is unchanged from prior exam. Reference made to PET-CT report of 03/17/2017. No acute abnormality identified. Electronically Signed   By: Marcello Moores  Register   On: 03/25/2017 10:27   Dg Cyndy Freeze Guide Cv Line-no Report  Result Date: 03/25/2017 Fluoroscopy was utilized by the requesting physician.  No radiographic interpretation.    ASSESSMENT AND PLAN:  This is a very pleasant 83 years old white male with malignant pleural mesothelioma involving the left hemothorax and currently undergoing systemic chemotherapy  was carboplatin, Alimta and Avastin status post 1 cycle. He tolerated the first cycle of the treatment well with no significant adverse effects. I recommended for the patient to proceed with cycle #2 today as scheduled. I will see him back for follow-up visit in 3 weeks for evaluation before starting cycle #3. He would have repeat imaging studies after completion of cycle #3. The patient was advised to call immediately if he has any concerning symptoms in the interval. The patient voices understanding of current disease status and treatment options and is in agreement with the current care plan. All questions were answered. The patient knows to call the clinic with any problems, questions or concerns. We can certainly see the patient much sooner if necessary. I spent 10 minutes counseling the patient face to face. The total time spent in the appointment was 15 minutes.  Disclaimer: This note was dictated with voice recognition software. Similar sounding words can inadvertently be transcribed and may not be corrected upon review.

## 2017-04-22 ENCOUNTER — Other Ambulatory Visit: Payer: Self-pay | Admitting: Neurology

## 2017-04-22 ENCOUNTER — Telehealth: Payer: Self-pay | Admitting: *Deleted

## 2017-04-22 DIAGNOSIS — R350 Frequency of micturition: Secondary | ICD-10-CM | POA: Diagnosis not present

## 2017-04-22 DIAGNOSIS — N3941 Urge incontinence: Secondary | ICD-10-CM | POA: Diagnosis not present

## 2017-04-22 NOTE — Telephone Encounter (Signed)
Medication is escitalopram. Thanks

## 2017-04-22 NOTE — Telephone Encounter (Signed)
He is needing a refill on his medication. He had to put off his appointments off with Dr. Si Raider for now due to a death in the family and she said he is needing due to that and having his chemo treatments. Please let her know if it can be called into [facility]. Thank you

## 2017-04-23 ENCOUNTER — Other Ambulatory Visit: Payer: Self-pay | Admitting: *Deleted

## 2017-04-23 MED ORDER — ESCITALOPRAM OXALATE 10 MG PO TABS: 10.0000 mg | ORAL_TABLET | Freq: Every day | ORAL | 1 refills | Status: DC

## 2017-04-27 ENCOUNTER — Telehealth: Payer: Self-pay | Admitting: *Deleted

## 2017-04-27 NOTE — Telephone Encounter (Signed)
Patient wife called stating that he is having sharp abdomen pain which usually happens after chemotherapy. Called patient back and patient stated that he is having abdomen pain, but not as bad as once was. Patient states that he does have pain medication if needed, but has not taken any. Patient rates his abdomen pain 2/10. Instructed patient to take pain medication when pain is present and call us with any other questions/concerns. Patient will be receiving labs tomorrow.

## 2017-04-28 ENCOUNTER — Ambulatory Visit (INDEPENDENT_AMBULATORY_CARE_PROVIDER_SITE_OTHER): Payer: PPO | Admitting: Neurology

## 2017-04-28 ENCOUNTER — Telehealth: Payer: Self-pay | Admitting: Medical Oncology

## 2017-04-28 ENCOUNTER — Other Ambulatory Visit (HOSPITAL_BASED_OUTPATIENT_CLINIC_OR_DEPARTMENT_OTHER): Payer: PPO

## 2017-04-28 DIAGNOSIS — E538 Deficiency of other specified B group vitamins: Secondary | ICD-10-CM | POA: Diagnosis not present

## 2017-04-28 DIAGNOSIS — C450 Mesothelioma of pleura: Secondary | ICD-10-CM | POA: Diagnosis not present

## 2017-04-28 LAB — COMPREHENSIVE METABOLIC PANEL
ALT: 24 U/L (ref 0–55)
ANION GAP: 6 meq/L (ref 3–11)
AST: 24 U/L (ref 5–34)
Albumin: 3.5 g/dL (ref 3.5–5.0)
Alkaline Phosphatase: 60 U/L (ref 40–150)
BILIRUBIN TOTAL: 1.07 mg/dL (ref 0.20–1.20)
BUN: 17 mg/dL (ref 7.0–26.0)
CALCIUM: 8.8 mg/dL (ref 8.4–10.4)
CHLORIDE: 104 meq/L (ref 98–109)
CO2: 27 meq/L (ref 22–29)
Creatinine: 1 mg/dL (ref 0.7–1.3)
EGFR: 74 mL/min/{1.73_m2} — AB (ref >90)
Glucose: 129 mg/dl (ref 70–140)
Potassium: 4.3 mEq/L (ref 3.5–5.1)
Sodium: 137 mEq/L (ref 136–145)
TOTAL PROTEIN: 6.1 g/dL — AB (ref 6.4–8.3)

## 2017-04-28 LAB — CBC WITH DIFFERENTIAL/PLATELET
BASO%: 0.8 % (ref 0.0–2.0)
BASOS ABS: 0 10*3/uL (ref 0.0–0.1)
EOS ABS: 0.1 10*3/uL (ref 0.0–0.5)
EOS%: 3.6 % (ref 0.0–7.0)
HCT: 43.2 % (ref 38.4–49.9)
HGB: 14.6 g/dL (ref 13.0–17.1)
LYMPH%: 45.3 % (ref 14.0–49.0)
MCH: 32 pg (ref 27.2–33.4)
MCHC: 33.7 g/dL (ref 32.0–36.0)
MCV: 94.9 fL (ref 79.3–98.0)
MONO#: 0.1 10*3/uL (ref 0.1–0.9)
MONO%: 5.7 % (ref 0.0–14.0)
NEUT#: 1.1 10*3/uL — ABNORMAL LOW (ref 1.5–6.5)
NEUT%: 44.6 % (ref 39.0–75.0)
PLATELETS: 122 10*3/uL — AB (ref 140–400)
RBC: 4.55 10*6/uL (ref 4.20–5.82)
RDW: 14.1 % (ref 11.0–14.6)
WBC: 2.5 10*3/uL — ABNORMAL LOW (ref 4.0–10.3)
lymph#: 1.1 10*3/uL (ref 0.9–3.3)

## 2017-04-28 MED ORDER — CYANOCOBALAMIN 1000 MCG/ML IJ SOLN
1000.0000 ug | Freq: Once | INTRAMUSCULAR | Status: AC
  Administered 2017-04-28: 1000 ug via INTRAMUSCULAR

## 2017-04-28 NOTE — Telephone Encounter (Signed)
Presents for lab and reports recent sharp intermittent left lower groin pain-usually has for 3 days after chemo but with last treatment it lasted longer. He does state today his pain is gone. He does not want to take pain med to "mask any problem". Per Julien Nordmann I told him his PET scan in April showed non obstructing renal calculas and to drink extra fluids , non caffeine( wife says he only drinks coffee) and take pain med and to call for temp 100.5 or higher and call in 48 hours if no better.

## 2017-04-29 DIAGNOSIS — H26492 Other secondary cataract, left eye: Secondary | ICD-10-CM | POA: Diagnosis not present

## 2017-05-05 ENCOUNTER — Other Ambulatory Visit (HOSPITAL_BASED_OUTPATIENT_CLINIC_OR_DEPARTMENT_OTHER): Payer: PPO

## 2017-05-05 DIAGNOSIS — C450 Mesothelioma of pleura: Secondary | ICD-10-CM

## 2017-05-05 LAB — COMPREHENSIVE METABOLIC PANEL
ALT: 43 U/L (ref 0–55)
AST: 41 U/L — ABNORMAL HIGH (ref 5–34)
Albumin: 3.6 g/dL (ref 3.5–5.0)
Alkaline Phosphatase: 53 U/L (ref 40–150)
Anion Gap: 7 mEq/L (ref 3–11)
BUN: 12 mg/dL (ref 7.0–26.0)
CHLORIDE: 105 meq/L (ref 98–109)
CO2: 26 mEq/L (ref 22–29)
Calcium: 8.8 mg/dL (ref 8.4–10.4)
Creatinine: 0.9 mg/dL (ref 0.7–1.3)
EGFR: 81 mL/min/{1.73_m2} — AB (ref >90)
GLUCOSE: 104 mg/dL (ref 70–140)
Potassium: 4.3 mEq/L (ref 3.5–5.1)
SODIUM: 138 meq/L (ref 136–145)
Total Bilirubin: 0.56 mg/dL (ref 0.20–1.20)
Total Protein: 6.1 g/dL — ABNORMAL LOW (ref 6.4–8.3)

## 2017-05-05 LAB — CBC WITH DIFFERENTIAL/PLATELET
BASO%: 0.3 % (ref 0.0–2.0)
BASOS ABS: 0 10*3/uL (ref 0.0–0.1)
EOS ABS: 0.1 10*3/uL (ref 0.0–0.5)
EOS%: 3.6 % (ref 0.0–7.0)
HCT: 40.2 % (ref 38.4–49.9)
HGB: 13.8 g/dL (ref 13.0–17.1)
LYMPH%: 43 % (ref 14.0–49.0)
MCH: 32.5 pg (ref 27.2–33.4)
MCHC: 34.3 g/dL (ref 32.0–36.0)
MCV: 94.8 fL (ref 79.3–98.0)
MONO#: 0.4 10*3/uL (ref 0.1–0.9)
MONO%: 12.7 % (ref 0.0–14.0)
NEUT#: 1.3 10*3/uL — ABNORMAL LOW (ref 1.5–6.5)
NEUT%: 40.4 % (ref 39.0–75.0)
Platelets: 95 10*3/uL — ABNORMAL LOW (ref 140–400)
RBC: 4.24 10*6/uL (ref 4.20–5.82)
RDW: 14.5 % (ref 11.0–14.6)
WBC: 3.3 10*3/uL — ABNORMAL LOW (ref 4.0–10.3)
lymph#: 1.4 10*3/uL (ref 0.9–3.3)

## 2017-05-07 NOTE — Addendum Note (Signed)
Addendum  created 05/07/17 1221 by Rica Koyanagi, MD   Sign clinical note

## 2017-05-10 ENCOUNTER — Encounter: Payer: PPO | Admitting: Psychology

## 2017-05-12 ENCOUNTER — Other Ambulatory Visit (HOSPITAL_BASED_OUTPATIENT_CLINIC_OR_DEPARTMENT_OTHER): Payer: PPO

## 2017-05-12 ENCOUNTER — Telehealth: Payer: Self-pay | Admitting: Internal Medicine

## 2017-05-12 ENCOUNTER — Ambulatory Visit (HOSPITAL_BASED_OUTPATIENT_CLINIC_OR_DEPARTMENT_OTHER): Payer: PPO

## 2017-05-12 ENCOUNTER — Ambulatory Visit (HOSPITAL_BASED_OUTPATIENT_CLINIC_OR_DEPARTMENT_OTHER): Payer: PPO | Admitting: Internal Medicine

## 2017-05-12 ENCOUNTER — Encounter: Payer: Self-pay | Admitting: Internal Medicine

## 2017-05-12 VITALS — BP 133/98

## 2017-05-12 VITALS — BP 136/103 | HR 75 | Temp 97.7°F | Resp 20 | Ht 71.0 in | Wt 201.1 lb

## 2017-05-12 DIAGNOSIS — Z5111 Encounter for antineoplastic chemotherapy: Secondary | ICD-10-CM

## 2017-05-12 DIAGNOSIS — Z5112 Encounter for antineoplastic immunotherapy: Secondary | ICD-10-CM

## 2017-05-12 DIAGNOSIS — J90 Pleural effusion, not elsewhere classified: Secondary | ICD-10-CM

## 2017-05-12 DIAGNOSIS — C450 Mesothelioma of pleura: Secondary | ICD-10-CM

## 2017-05-12 LAB — COMPREHENSIVE METABOLIC PANEL
ALBUMIN: 3.9 g/dL (ref 3.5–5.0)
ALT: 36 U/L (ref 0–55)
AST: 34 U/L (ref 5–34)
Alkaline Phosphatase: 59 U/L (ref 40–150)
Anion Gap: 11 mEq/L (ref 3–11)
BUN: 28.6 mg/dL — AB (ref 7.0–26.0)
CALCIUM: 9.3 mg/dL (ref 8.4–10.4)
CHLORIDE: 104 meq/L (ref 98–109)
CO2: 19 mEq/L — ABNORMAL LOW (ref 22–29)
CREATININE: 1 mg/dL (ref 0.7–1.3)
EGFR: 70 mL/min/{1.73_m2} — ABNORMAL LOW (ref >90)
GLUCOSE: 212 mg/dL — AB (ref 70–140)
Potassium: 4.7 mEq/L (ref 3.5–5.1)
SODIUM: 134 meq/L — AB (ref 136–145)
Total Bilirubin: 0.72 mg/dL (ref 0.20–1.20)
Total Protein: 6.7 g/dL (ref 6.4–8.3)

## 2017-05-12 LAB — CBC WITH DIFFERENTIAL/PLATELET
BASO%: 0.3 % (ref 0.0–2.0)
Basophils Absolute: 0 10*3/uL (ref 0.0–0.1)
EOS%: 0 % (ref 0.0–7.0)
Eosinophils Absolute: 0 10*3/uL (ref 0.0–0.5)
HEMATOCRIT: 41 % (ref 38.4–49.9)
HGB: 14.3 g/dL (ref 13.0–17.1)
LYMPH#: 1 10*3/uL (ref 0.9–3.3)
LYMPH%: 25.1 % (ref 14.0–49.0)
MCH: 32.9 pg (ref 27.2–33.4)
MCHC: 34.9 g/dL (ref 32.0–36.0)
MCV: 94.3 fL (ref 79.3–98.0)
MONO#: 0.3 10*3/uL (ref 0.1–0.9)
MONO%: 6.4 % (ref 0.0–14.0)
NEUT%: 68.2 % (ref 39.0–75.0)
NEUTROS ABS: 2.7 10*3/uL (ref 1.5–6.5)
Platelets: 219 10*3/uL (ref 140–400)
RBC: 4.35 10*6/uL (ref 4.20–5.82)
RDW: 15.5 % — ABNORMAL HIGH (ref 11.0–14.6)
WBC: 3.9 10*3/uL — ABNORMAL LOW (ref 4.0–10.3)
nRBC: 0 % (ref 0–0)

## 2017-05-12 LAB — UA PROTEIN, DIPSTICK - CHCC

## 2017-05-12 MED ORDER — SODIUM CHLORIDE 0.9 % IV SOLN
14.5000 mg/kg | Freq: Once | INTRAVENOUS | Status: AC
  Administered 2017-05-12: 1300 mg via INTRAVENOUS
  Filled 2017-05-12: qty 48

## 2017-05-12 MED ORDER — SODIUM CHLORIDE 0.9% FLUSH
10.0000 mL | INTRAVENOUS | Status: DC | PRN
Start: 2017-05-12 — End: 2017-05-12
  Administered 2017-05-12: 10 mL
  Filled 2017-05-12: qty 10

## 2017-05-12 MED ORDER — SODIUM CHLORIDE 0.9 % IV SOLN
Freq: Once | INTRAVENOUS | Status: AC
  Administered 2017-05-12: 13:00:00 via INTRAVENOUS
  Filled 2017-05-12: qty 5

## 2017-05-12 MED ORDER — SODIUM CHLORIDE 0.9 % IV SOLN
475.0000 mg/m2 | Freq: Once | INTRAVENOUS | Status: AC
  Administered 2017-05-12: 1000 mg via INTRAVENOUS
  Filled 2017-05-12: qty 40

## 2017-05-12 MED ORDER — HEPARIN SOD (PORK) LOCK FLUSH 100 UNIT/ML IV SOLN
500.0000 [IU] | Freq: Once | INTRAVENOUS | Status: AC | PRN
  Administered 2017-05-12: 500 [IU]
  Filled 2017-05-12: qty 5

## 2017-05-12 MED ORDER — CARBOPLATIN CHEMO INJECTION 600 MG/60ML
499.5000 mg | Freq: Once | INTRAVENOUS | Status: AC
  Administered 2017-05-12: 500 mg via INTRAVENOUS
  Filled 2017-05-12: qty 50

## 2017-05-12 MED ORDER — CYANOCOBALAMIN 1000 MCG/ML IJ SOLN: 1000.0000 ug | Freq: Once | INTRAMUSCULAR | Status: DC

## 2017-05-12 MED ORDER — SODIUM CHLORIDE 0.9 % IV SOLN
Freq: Once | INTRAVENOUS | Status: AC
  Administered 2017-05-12: 12:00:00 via INTRAVENOUS

## 2017-05-12 MED ORDER — PALONOSETRON HCL INJECTION 0.25 MG/5ML
0.2500 mg | Freq: Once | INTRAVENOUS | Status: AC
Start: 2017-05-12 — End: 2017-05-12
  Administered 2017-05-12: 0.25 mg via INTRAVENOUS

## 2017-05-12 MED ORDER — PALONOSETRON HCL INJECTION 0.25 MG/5ML
INTRAVENOUS | Status: AC
  Filled 2017-05-12: qty 5

## 2017-05-12 NOTE — Progress Notes (Signed)
[facility] Telephone:([phone]   Fax:(336) [phone]  OFFICE PROGRESS NOTE  [name], MD [place] [address]  DIAGNOSIS: Stage II malignant pleural mesothelioma, epithelioid type involving the left hemithorax diagnosed in March 2018.  PRIOR THERAPY: Status post left VATS with biopsy and wedge resection of the left lower lobe as well as parietal pleurectomy under the care of Dr. Roxan Hockey on 02/15/2017.  CURRENT THERAPY: Palliative systemic chemotherapy with carboplatin for AUC of 5, Alimta 500 MG/M2 and Avastin 15 MG/KG every 3 weeks. Status post 2 cycles.  INTERVAL HISTORY: Dean Pruitt [place] 83 y.o. male returns to the clinic today for follow-up visit accompanied by his wife. The patient is feeling fine today with no specific complaints. He gets left lower quadrant abdominal pain for a few days after every treatment. It resolved spontaneously and he did not have to take any pain medication. He denied having any current chest pain, shortness breath, cough or hemoptysis. He denied having any weight loss or night sweats. He has no nausea, vomiting, diarrhea or constipation. He is here today for evaluation before starting cycle #3 of his treatment.  MEDICAL HISTORY: Past Medical History:  Diagnosis Date  . Cataract   . Depression    situational  . GERD (gastroesophageal reflux disease)   . Goals of care, counseling/discussion 03/20/2017  . Hyperlipidemia   . Hypothyroidism   . Reactive depression 03/20/2017  . Thyroid cancer ([place])    PMH of; on supressive therapy    ALLERGIES:  is allergic to ativan [lorazepam]; tramadol; and fentanyl.  MEDICATIONS:  Current Outpatient Prescriptions  Medication Sig Dispense Refill  . aspirin 81 MG tablet Take 81 mg by mouth daily.    Marland Kitchen dexamethasone (DECADRON) 4 MG tablet 4 mg by mouth twice a day the day before, day of and day after the chemotherapy every 3 weeks. 40 tablet 1  . diphenhydrAMINE  (BENADRYL) 25 MG tablet Take 50 mg by mouth at bedtime.     Marland Kitchen escitalopram (LEXAPRO) 10 MG tablet TAKE ONE TABLET BY MOUTH ONCE DAILY AT BEDTIME 30 tablet 3  . escitalopram (LEXAPRO) 10 MG tablet Take 1 tablet (10 mg total) by mouth at bedtime. 30 tablet 1  . folic acid (FOLVITE) 1 MG tablet Take 1 tablet (1 mg total) by mouth daily. 30 tablet 4  . gabapentin (NEURONTIN) 300 MG capsule TAKE ONE CAPSULE BY MOUTH AT NIGHT 30 capsule 3  . levothyroxine (SYNTHROID, LEVOTHROID) 150 MCG tablet TAKE 1 TABLET (150 mcg) BY MOUTH 3 days a week, 75 mcg 4 days a week (Patient taking differently: Take 75-150 mcg by mouth See admin instructions. 150 mcg every day except Tuesday and Thursdays and takes 75 mcg (1/2 tablet)) 45 tablet 1  . lidocaine-prilocaine (EMLA) cream Apply 1 application topically as needed. (Patient taking differently: Apply 1 application topically as needed (port access). ) 30 g 0  . omeprazole (PRILOSEC) 20 MG capsule Take 20 mg by mouth every other day.    . oxyCODONE (OXY IR/ROXICODONE) 5 MG immediate release tablet Take 1 tablet (5 mg total) by mouth every 6 (six) hours as needed for severe pain. (Patient not taking: Reported on 04/21/2017) 30 tablet 0  . Probiotic Product (PROBIOTIC PO) Take 1 tablet by mouth 2 (two) times daily.     . prochlorperazine (COMPAZINE) 10 MG tablet Take 1 tablet (10 mg total) by mouth every 6 (six) hours as needed for nausea or vomiting. (  Patient not taking: Reported on 04/21/2017) 30 tablet 0  . rosuvastatin (CRESTOR) 20 MG tablet Take 1 tablet (20 mg total) by mouth daily. (Patient taking differently: Take 20 mg by mouth every other day. ) 90 tablet 3   No current facility-administered medications for this visit.     SURGICAL HISTORY:  Past Surgical History:  Procedure Laterality Date  . CATARACT EXTRACTION, BILATERAL  2013 & 2014   Dr Celene Squibb  . colon polyps  2002 & 2005   negative 2008; Dr Earlean Shawl  . COLONOSCOPY W/ POLYPECTOMY    . LUNG BIOPSY Left  02/15/2017   Procedure: LUNG BIOPSY;  Surgeon: Melrose Nakayama, MD;  Location: [facility];  Service: Thoracic;  Laterality: Left;  . PLEURAL EFFUSION DRAINAGE Left 02/15/2017   Procedure: DRAINAGE OF PLEURAL EFFUSION;  Surgeon: Melrose Nakayama, MD;  Location: [facility];  Service: Thoracic;  Laterality: Left;  . PORTACATH PLACEMENT N/A 03/25/2017   Procedure: INSERTION PORT-A-CATH, right internal jugular;  Surgeon: Melrose Nakayama, MD;  Location: [facility];  Service: Thoracic;  Laterality: N/A;  . THYROIDECTOMY  2001   S/P RAI  . VIDEO ASSISTED THORACOSCOPY Left 02/15/2017   Procedure: VIDEO ASSISTED THORACOSCOPY;  Surgeon: Melrose Nakayama, MD;  Location: [facility] OR;  Service: Thoracic;  Laterality: Left;    REVIEW OF SYSTEMS:  A comprehensive review of systems was negative.   PHYSICAL EXAMINATION: General appearance: alert, cooperative and no distress Head: Normocephalic, without obvious abnormality, atraumatic Neck: no adenopathy, no JVD, supple, symmetrical, trachea midline and thyroid not enlarged, symmetric, no tenderness/mass/nodules Lymph nodes: Cervical, supraclavicular, and axillary nodes normal. Resp: clear to auscultation bilaterally Back: symmetric, no curvature. ROM normal. No CVA tenderness. Cardio: regular rate and rhythm, S1, S2 normal, no murmur, click, rub or gallop GI: soft, non-tender; bowel sounds normal; no masses,  no organomegaly Extremities: extremities normal, atraumatic, no cyanosis or edema  ECOG PERFORMANCE STATUS: 1 - Symptomatic but completely ambulatory  Blood pressure (!) 136/103, pulse 75, temperature 97.7 F (36.5 C), temperature source Oral, resp. rate 20, height 5\' 11"  (1.803 m), weight 201 lb 1.6 oz (91.2 kg), SpO2 98 %.  LABORATORY DATA: Lab Results  Component Value Date   WBC 3.9 (L) 05/12/2017   HGB 14.3 05/12/2017   HCT 41.0 05/12/2017   MCV 94.3 05/12/2017   PLT 219 05/12/2017      Chemistry      Component Value Date/Time   NA 138  05/05/2017 1225   K 4.3 05/05/2017 1225   CL 103 03/25/2017 0618   CO2 26 05/05/2017 1225   BUN 12.0 05/05/2017 1225   CREATININE 0.9 05/05/2017 1225      Component Value Date/Time   CALCIUM 8.8 05/05/2017 1225   ALKPHOS 53 05/05/2017 1225   AST 41 (H) 05/05/2017 1225   ALT 43 05/05/2017 1225   BILITOT 0.56 05/05/2017 1225       RADIOGRAPHIC STUDIES: No results found.  ASSESSMENT AND PLAN:  This is a very pleasant 83 years old white male with malignant pleural mesothelioma involving the left hemothorax and currently undergoing systemic chemotherapy with carboplatin, Alimta and Avastin status post 2 cycles.  He tolerated the second cycle of his treatment well with no significant adverse effects. I recommended for the patient to proceed with cycle #3 today as a scheduled. I will see him back for follow-up visit in 3 weeks for evaluation after repeating CT scan of the chest for restaging of his disease. He was advised to call  immediately if he has any concerning symptoms in the interval. The patient voices understanding of current disease status and treatment options and is in agreement with the current care plan. All questions were answered. The patient knows to call the clinic with any problems, questions or concerns. We can certainly see the patient much sooner if necessary. I spent 10 minutes counseling the patient face to face. The total time spent in the appointment was 15 minutes.  Disclaimer: This note was dictated with voice recognition software. Similar sounding words can inadvertently be transcribed and may not be corrected upon review.

## 2017-05-12 NOTE — Telephone Encounter (Signed)
Scheduled appt per 6/6 los. Patient went to treatment area before schedule finished - patient to get new print out in the treatment area.

## 2017-05-12 NOTE — Patient Instructions (Signed)
[facility] Discharge Instructions for Patients Receiving Chemotherapy  Today you received the following chemotherapy agents bevacizumab (Avastin), pemetrexed (Alimta), and carboplatin (Paraplatin)  To help prevent nausea and vomiting after your treatment, we encourage you to take your nausea medication as directed by your doctor.   If you develop nausea and vomiting that is not controlled by your nausea medication, call the clinic.   BELOW ARE SYMPTOMS THAT SHOULD BE REPORTED IMMEDIATELY:  *FEVER GREATER THAN 100.5 F  *CHILLS WITH OR WITHOUT FEVER  NAUSEA AND VOMITING THAT IS NOT CONTROLLED WITH YOUR NAUSEA MEDICATION  *UNUSUAL SHORTNESS OF BREATH  *UNUSUAL BRUISING OR BLEEDING  TENDERNESS IN MOUTH AND THROAT WITH OR WITHOUT PRESENCE OF ULCERS  *URINARY PROBLEMS  *BOWEL PROBLEMS  UNUSUAL RASH Items with * indicate a potential emergency and should be followed up as soon as possible.  Feel free to call the clinic you have any questions or concerns. The clinic phone number is [phone].  Please show the [facility] at check-in to the Emergency Department and triage nurse.

## 2017-05-13 DIAGNOSIS — N3941 Urge incontinence: Secondary | ICD-10-CM | POA: Diagnosis not present

## 2017-05-13 DIAGNOSIS — R350 Frequency of micturition: Secondary | ICD-10-CM | POA: Diagnosis not present

## 2017-05-17 ENCOUNTER — Ambulatory Visit: Payer: PPO | Admitting: Neurology

## 2017-05-19 ENCOUNTER — Other Ambulatory Visit (HOSPITAL_BASED_OUTPATIENT_CLINIC_OR_DEPARTMENT_OTHER): Payer: PPO

## 2017-05-19 DIAGNOSIS — C450 Mesothelioma of pleura: Secondary | ICD-10-CM

## 2017-05-19 LAB — COMPREHENSIVE METABOLIC PANEL
ALT: 24 U/L (ref 0–55)
ANION GAP: 9 meq/L (ref 3–11)
AST: 25 U/L (ref 5–34)
Albumin: 3.5 g/dL (ref 3.5–5.0)
Alkaline Phosphatase: 58 U/L (ref 40–150)
BILIRUBIN TOTAL: 0.94 mg/dL (ref 0.20–1.20)
BUN: 15.7 mg/dL (ref 7.0–26.0)
CALCIUM: 9.1 mg/dL (ref 8.4–10.4)
CO2: 27 meq/L (ref 22–29)
Chloride: 102 mEq/L (ref 98–109)
Creatinine: 0.8 mg/dL (ref 0.7–1.3)
EGFR: 82 mL/min/{1.73_m2} — ABNORMAL LOW (ref >90)
Glucose: 114 mg/dl (ref 70–140)
Potassium: 4.3 mEq/L (ref 3.5–5.1)
Sodium: 139 mEq/L (ref 136–145)
TOTAL PROTEIN: 6.1 g/dL — AB (ref 6.4–8.3)

## 2017-05-19 LAB — CBC WITH DIFFERENTIAL/PLATELET
BASO%: 0.4 % (ref 0.0–2.0)
Basophils Absolute: 0 10*3/uL (ref 0.0–0.1)
EOS%: 3 % (ref 0.0–7.0)
Eosinophils Absolute: 0.1 10*3/uL (ref 0.0–0.5)
HEMATOCRIT: 41.3 % (ref 38.4–49.9)
HGB: 14.3 g/dL (ref 13.0–17.1)
LYMPH#: 1.1 10*3/uL (ref 0.9–3.3)
LYMPH%: 45.5 % (ref 14.0–49.0)
MCH: 32.8 pg (ref 27.2–33.4)
MCHC: 34.6 g/dL (ref 32.0–36.0)
MCV: 94.7 fL (ref 79.3–98.0)
MONO#: 0.2 10*3/uL (ref 0.1–0.9)
MONO%: 6.8 % (ref 0.0–14.0)
NEUT#: 1 10*3/uL — ABNORMAL LOW (ref 1.5–6.5)
NEUT%: 44.3 % (ref 39.0–75.0)
PLATELETS: 95 10*3/uL — AB (ref 140–400)
RBC: 4.36 10*6/uL (ref 4.20–5.82)
RDW: 15.4 % — ABNORMAL HIGH (ref 11.0–14.6)
WBC: 2.4 10*3/uL — AB (ref 4.0–10.3)

## 2017-05-26 ENCOUNTER — Other Ambulatory Visit (HOSPITAL_BASED_OUTPATIENT_CLINIC_OR_DEPARTMENT_OTHER): Payer: PPO

## 2017-05-26 ENCOUNTER — Ambulatory Visit (INDEPENDENT_AMBULATORY_CARE_PROVIDER_SITE_OTHER): Payer: PPO | Admitting: *Deleted

## 2017-05-26 ENCOUNTER — Other Ambulatory Visit: Payer: PPO

## 2017-05-26 DIAGNOSIS — E538 Deficiency of other specified B group vitamins: Secondary | ICD-10-CM | POA: Diagnosis not present

## 2017-05-26 DIAGNOSIS — C450 Mesothelioma of pleura: Secondary | ICD-10-CM | POA: Diagnosis not present

## 2017-05-26 LAB — CBC WITH DIFFERENTIAL/PLATELET
BASO%: 0.7 % (ref 0.0–2.0)
BASOS ABS: 0 10*3/uL (ref 0.0–0.1)
EOS ABS: 0.1 10*3/uL (ref 0.0–0.5)
EOS%: 4.3 % (ref 0.0–7.0)
HEMATOCRIT: 41.2 % (ref 38.4–49.9)
HGB: 14 g/dL (ref 13.0–17.1)
LYMPH#: 1.1 10*3/uL (ref 0.9–3.3)
LYMPH%: 34.4 % (ref 14.0–49.0)
MCH: 32.7 pg (ref 27.2–33.4)
MCHC: 33.9 g/dL (ref 32.0–36.0)
MCV: 96.4 fL (ref 79.3–98.0)
MONO#: 0.4 10*3/uL (ref 0.1–0.9)
MONO%: 11.5 % (ref 0.0–14.0)
NEUT#: 1.6 10*3/uL (ref 1.5–6.5)
NEUT%: 49.1 % (ref 39.0–75.0)
PLATELETS: 110 10*3/uL — AB (ref 140–400)
RBC: 4.27 10*6/uL (ref 4.20–5.82)
RDW: 17.8 % — ABNORMAL HIGH (ref 11.0–14.6)
WBC: 3.3 10*3/uL — ABNORMAL LOW (ref 4.0–10.3)

## 2017-05-26 LAB — COMPREHENSIVE METABOLIC PANEL
ALT: 37 U/L (ref 0–55)
AST: 36 U/L — ABNORMAL HIGH (ref 5–34)
Albumin: 3.5 g/dL (ref 3.5–5.0)
Alkaline Phosphatase: 58 U/L (ref 40–150)
Anion Gap: 11 mEq/L (ref 3–11)
BUN: 10.8 mg/dL (ref 7.0–26.0)
CO2: 26 meq/L (ref 22–29)
CREATININE: 0.9 mg/dL (ref 0.7–1.3)
Calcium: 9 mg/dL (ref 8.4–10.4)
Chloride: 103 mEq/L (ref 98–109)
EGFR: 81 mL/min/{1.73_m2} — ABNORMAL LOW (ref >90)
GLUCOSE: 129 mg/dL (ref 70–140)
Potassium: 4.3 mEq/L (ref 3.5–5.1)
Sodium: 140 mEq/L (ref 136–145)
Total Bilirubin: 0.53 mg/dL (ref 0.20–1.20)
Total Protein: 6.4 g/dL (ref 6.4–8.3)

## 2017-05-26 LAB — UA PROTEIN, DIPSTICK - CHCC: Protein, ur: NEGATIVE mg/dL

## 2017-05-26 MED ORDER — CYANOCOBALAMIN 1000 MCG/ML IJ SOLN
1000.0000 ug | Freq: Once | INTRAMUSCULAR | Status: AC
  Administered 2017-05-26: 1000 ug via INTRAMUSCULAR

## 2017-05-27 ENCOUNTER — Other Ambulatory Visit: Payer: PPO

## 2017-05-27 ENCOUNTER — Ambulatory Visit (HOSPITAL_COMMUNITY): Payer: PPO

## 2017-05-27 DIAGNOSIS — H26491 Other secondary cataract, right eye: Secondary | ICD-10-CM | POA: Diagnosis not present

## 2017-05-31 ENCOUNTER — Ambulatory Visit (HOSPITAL_COMMUNITY)
Admission: RE | Admit: 2017-05-31 | Discharge: 2017-05-31 | Disposition: A | Discharge status: Home / Self Care | Payer: PPO | Source: Ambulatory Visit | Attending: Internal Medicine | Admitting: Internal Medicine

## 2017-05-31 ENCOUNTER — Encounter (HOSPITAL_COMMUNITY): Payer: Self-pay

## 2017-05-31 DIAGNOSIS — C450 Mesothelioma of pleura: Secondary | ICD-10-CM | POA: Insufficient documentation

## 2017-05-31 DIAGNOSIS — R590 Localized enlarged lymph nodes: Secondary | ICD-10-CM | POA: Insufficient documentation

## 2017-05-31 DIAGNOSIS — J90 Pleural effusion, not elsewhere classified: Secondary | ICD-10-CM | POA: Diagnosis not present

## 2017-05-31 DIAGNOSIS — Z9889 Other specified postprocedural states: Secondary | ICD-10-CM | POA: Diagnosis not present

## 2017-05-31 DIAGNOSIS — J439 Emphysema, unspecified: Secondary | ICD-10-CM | POA: Insufficient documentation

## 2017-05-31 DIAGNOSIS — I700 Atherosclerosis of aorta: Secondary | ICD-10-CM | POA: Diagnosis not present

## 2017-05-31 DIAGNOSIS — Z5111 Encounter for antineoplastic chemotherapy: Secondary | ICD-10-CM

## 2017-05-31 MED ORDER — HEPARIN SOD (PORK) LOCK FLUSH 100 UNIT/ML IV SOLN
INTRAVENOUS | Status: AC
  Filled 2017-05-31: qty 5

## 2017-05-31 MED ORDER — IOPAMIDOL (ISOVUE-300) INJECTION 61%: 75.0000 mL | Freq: Once | INTRAVENOUS | Status: DC | PRN

## 2017-05-31 MED ORDER — HEPARIN SOD (PORK) LOCK FLUSH 100 UNIT/ML IV SOLN
500.0000 [IU] | Freq: Once | INTRAVENOUS | Status: AC
  Administered 2017-05-31: 500 [IU] via INTRAVENOUS

## 2017-05-31 MED ORDER — IOPAMIDOL (ISOVUE-300) INJECTION 61%
75.0000 mL | Freq: Once | INTRAVENOUS | Status: AC | PRN
  Administered 2017-05-31: 75 mL via INTRAVENOUS

## 2017-05-31 MED ORDER — IOPAMIDOL (ISOVUE-300) INJECTION 61%
INTRAVENOUS | Status: AC
Start: 2017-05-31 — End: 2017-05-31
  Filled 2017-05-31: qty 75

## 2017-06-02 ENCOUNTER — Ambulatory Visit: Payer: PPO

## 2017-06-02 ENCOUNTER — Encounter: Payer: Self-pay | Admitting: Internal Medicine

## 2017-06-02 ENCOUNTER — Ambulatory Visit (HOSPITAL_BASED_OUTPATIENT_CLINIC_OR_DEPARTMENT_OTHER): Payer: PPO | Admitting: Internal Medicine

## 2017-06-02 ENCOUNTER — Ambulatory Visit (HOSPITAL_BASED_OUTPATIENT_CLINIC_OR_DEPARTMENT_OTHER): Payer: PPO

## 2017-06-02 ENCOUNTER — Other Ambulatory Visit (HOSPITAL_BASED_OUTPATIENT_CLINIC_OR_DEPARTMENT_OTHER): Payer: PPO

## 2017-06-02 ENCOUNTER — Other Ambulatory Visit: Payer: PPO

## 2017-06-02 VITALS — BP 130/83 | HR 72 | Temp 97.7°F | Resp 18 | Ht 71.0 in | Wt 205.7 lb

## 2017-06-02 DIAGNOSIS — Z5111 Encounter for antineoplastic chemotherapy: Secondary | ICD-10-CM

## 2017-06-02 DIAGNOSIS — C450 Mesothelioma of pleura: Secondary | ICD-10-CM

## 2017-06-02 DIAGNOSIS — F329 Major depressive disorder, single episode, unspecified: Secondary | ICD-10-CM

## 2017-06-02 DIAGNOSIS — Z5112 Encounter for antineoplastic immunotherapy: Secondary | ICD-10-CM | POA: Diagnosis not present

## 2017-06-02 DIAGNOSIS — Z95828 Presence of other vascular implants and grafts: Secondary | ICD-10-CM | POA: Insufficient documentation

## 2017-06-02 LAB — COMPREHENSIVE METABOLIC PANEL
ALT: 31 U/L (ref 0–55)
AST: 30 U/L (ref 5–34)
Albumin: 3.9 g/dL (ref 3.5–5.0)
Alkaline Phosphatase: 58 U/L (ref 40–150)
Anion Gap: 11 mEq/L (ref 3–11)
BILIRUBIN TOTAL: 0.74 mg/dL (ref 0.20–1.20)
BUN: 12.5 mg/dL (ref 7.0–26.0)
CHLORIDE: 99 meq/L (ref 98–109)
CO2: 23 meq/L (ref 22–29)
CREATININE: 0.9 mg/dL (ref 0.7–1.3)
Calcium: 9.5 mg/dL (ref 8.4–10.4)
EGFR: 80 mL/min/{1.73_m2} — ABNORMAL LOW (ref >90)
Glucose: 150 mg/dl — ABNORMAL HIGH (ref 70–140)
Potassium: 4.7 mEq/L (ref 3.5–5.1)
Sodium: 133 mEq/L — ABNORMAL LOW (ref 136–145)
TOTAL PROTEIN: 6.8 g/dL (ref 6.4–8.3)

## 2017-06-02 LAB — CBC WITH DIFFERENTIAL/PLATELET
BASO%: 0.2 % (ref 0.0–2.0)
Basophils Absolute: 0 10*3/uL (ref 0.0–0.1)
EOS%: 0 % (ref 0.0–7.0)
Eosinophils Absolute: 0 10*3/uL (ref 0.0–0.5)
HEMATOCRIT: 38.9 % (ref 38.4–49.9)
HGB: 13.8 g/dL (ref 13.0–17.1)
LYMPH#: 1.3 10*3/uL (ref 0.9–3.3)
LYMPH%: 20.7 % (ref 14.0–49.0)
MCH: 33.6 pg — ABNORMAL HIGH (ref 27.2–33.4)
MCHC: 35.5 g/dL (ref 32.0–36.0)
MCV: 94.6 fL (ref 79.3–98.0)
MONO#: 0.6 10*3/uL (ref 0.1–0.9)
MONO%: 9.6 % (ref 0.0–14.0)
NEUT%: 69.5 % (ref 39.0–75.0)
NEUTROS ABS: 4.4 10*3/uL (ref 1.5–6.5)
Platelets: 219 10*3/uL (ref 140–400)
RBC: 4.11 10*6/uL — AB (ref 4.20–5.82)
RDW: 17.5 % — ABNORMAL HIGH (ref 11.0–14.6)
WBC: 6.4 10*3/uL (ref 4.0–10.3)

## 2017-06-02 MED ORDER — SODIUM CHLORIDE 0.9% FLUSH
10.0000 mL | INTRAVENOUS | Status: DC | PRN
  Administered 2017-06-02: 10 mL
  Filled 2017-06-02: qty 10

## 2017-06-02 MED ORDER — PALONOSETRON HCL INJECTION 0.25 MG/5ML
0.2500 mg | Freq: Once | INTRAVENOUS | Status: AC
  Administered 2017-06-02: 0.25 mg via INTRAVENOUS

## 2017-06-02 MED ORDER — SODIUM CHLORIDE 0.9 % IV SOLN
Freq: Once | INTRAVENOUS | Status: AC
  Administered 2017-06-02: 14:00:00 via INTRAVENOUS

## 2017-06-02 MED ORDER — SODIUM CHLORIDE 0.9 % IV SOLN
Freq: Once | INTRAVENOUS | Status: AC
  Administered 2017-06-02: 14:00:00 via INTRAVENOUS
  Filled 2017-06-02: qty 5

## 2017-06-02 MED ORDER — SODIUM CHLORIDE 0.9 % IV SOLN
500.0000 mg | Freq: Once | INTRAVENOUS | Status: AC
  Administered 2017-06-02: 500 mg via INTRAVENOUS
  Filled 2017-06-02: qty 50

## 2017-06-02 MED ORDER — SODIUM CHLORIDE 0.9 % IV SOLN
15.5000 mg/kg | Freq: Once | INTRAVENOUS | Status: AC
  Administered 2017-06-02: 1400 mg via INTRAVENOUS
  Filled 2017-06-02: qty 48

## 2017-06-02 MED ORDER — HEPARIN SOD (PORK) LOCK FLUSH 100 UNIT/ML IV SOLN
500.0000 [IU] | Freq: Once | INTRAVENOUS | Status: AC | PRN
  Administered 2017-06-02: 500 [IU]
  Filled 2017-06-02: qty 5

## 2017-06-02 MED ORDER — SODIUM CHLORIDE 0.9% FLUSH
10.0000 mL | Freq: Once | INTRAVENOUS | Status: AC
  Administered 2017-06-02: 10 mL
  Filled 2017-06-02: qty 10

## 2017-06-02 MED ORDER — SODIUM CHLORIDE 0.9 % IV SOLN
514.0000 mg/m2 | Freq: Once | INTRAVENOUS | Status: AC
  Administered 2017-06-02: 1100 mg via INTRAVENOUS
  Filled 2017-06-02: qty 40

## 2017-06-02 NOTE — Progress Notes (Signed)
[facility] [phone]   [phone]  OFFICE PROGRESS NOTE  [name], MD [place] [address]  DIAGNOSIS: Stage II malignant pleural mesothelioma, epithelioid type involving the left hemithorax diagnosed in March 2018.  PRIOR THERAPY: Status post left VATS with biopsy and wedge resection of the left lower lobe as well as parietal pleurectomy under the care of Dr. Roxan Hockey on 02/15/2017.  CURRENT THERAPY: Palliative systemic chemotherapy with carboplatin for AUC of 5, Alimta 500 MG/M2 and Avastin 15 MG/KG every 3 weeks. Status post 3 cycles.  INTERVAL HISTORY: Dean Pruitt 83 y.o. male returns to the clinic today for follow-up visit accompanied by his wife. The patient is feeling fine today with no specific complaints except for occasional left-sided chest pain. He denied having any shortness of breath, cough or hemoptysis. He denied having any weight loss or night sweats. He has no fever or chills. He continues to tolerate his treatment with systemic chemotherapy was carboplatin, Alimta and Avastin fairly well. He denied having any nausea, vomiting, diarrhea or constipation. The patient had repeat CT scan of the chest performed recently and he is here for evaluation and discussion of his scan results and treatment options.  MEDICAL HISTORY: Past Medical History:  Diagnosis Date  . Cataract   . Depression    situational  . GERD (gastroesophageal reflux disease)   . Goals of care, counseling/discussion 03/20/2017  . Hyperlipidemia   . Hypothyroidism   . Reactive depression 03/20/2017  . Thyroid cancer ([place])    PMH of; on supressive therapy    ALLERGIES:  is allergic to ativan [lorazepam]; tramadol; and fentanyl.  MEDICATIONS:  Current Outpatient Prescriptions  Medication Sig Dispense Refill  . aspirin 81 MG tablet Take 81 mg by mouth daily.    Marland Kitchen dexamethasone (DECADRON) 4 MG tablet 4 mg by mouth twice a day the  day before, day of and day after the chemotherapy every 3 weeks. 40 tablet 1  . diphenhydrAMINE (BENADRYL) 25 MG tablet Take 50 mg by mouth at bedtime.     Marland Kitchen escitalopram (LEXAPRO) 10 MG tablet TAKE ONE TABLET BY MOUTH ONCE DAILY AT BEDTIME 30 tablet 3  . escitalopram (LEXAPRO) 10 MG tablet Take 1 tablet (10 mg total) by mouth at bedtime. 30 tablet 1  . folic acid (FOLVITE) 1 MG tablet Take 1 tablet (1 mg total) by mouth daily. 30 tablet 4  . gabapentin (NEURONTIN) 300 MG capsule TAKE ONE CAPSULE BY MOUTH AT NIGHT 30 capsule 3  . levothyroxine (SYNTHROID, LEVOTHROID) 150 MCG tablet TAKE 1 TABLET (150 mcg) BY MOUTH 3 days a week, 75 mcg 4 days a week (Patient taking differently: Take 75-150 mcg by mouth See admin instructions. 150 mcg every day except Tuesday and Thursdays and takes 75 mcg (1/2 tablet)) 45 tablet 1  . lidocaine-prilocaine (EMLA) cream Apply 1 application topically as needed. (Patient taking differently: Apply 1 application topically as needed (port access). ) 30 g 0  . omeprazole (PRILOSEC) 20 MG capsule Take 20 mg by mouth every other day.    . oxyCODONE (OXY IR/ROXICODONE) 5 MG immediate release tablet Take 1 tablet (5 mg total) by mouth every 6 (six) hours as needed for severe pain. 30 tablet 0  . Probiotic Product (PROBIOTIC PO) Take 1 tablet by mouth 2 (two) times daily.     . prochlorperazine (COMPAZINE) 10 MG tablet Take 1 tablet (10 mg total) by mouth every 6 (six)  hours as needed for nausea or vomiting. 30 tablet 0  . rosuvastatin (CRESTOR) 20 MG tablet Take 1 tablet (20 mg total) by mouth daily. (Patient taking differently: Take 20 mg by mouth every other day. ) 90 tablet 3   No current facility-administered medications for this visit.     SURGICAL HISTORY:  Past Surgical History:  Procedure Laterality Date  . CATARACT EXTRACTION, BILATERAL  2013 & 2014   Dr Celene Squibb  . colon polyps  2002 & 2005   negative 2008; Dr Earlean Shawl  . COLONOSCOPY W/ POLYPECTOMY    . LUNG BIOPSY  Left 02/15/2017   Procedure: LUNG BIOPSY;  Surgeon: Melrose Nakayama, MD;  Location: [facility];  Service: Thoracic;  Laterality: Left;  . PLEURAL EFFUSION DRAINAGE Left 02/15/2017   Procedure: DRAINAGE OF PLEURAL EFFUSION;  Surgeon: Melrose Nakayama, MD;  Location: [facility];  Service: Thoracic;  Laterality: Left;  . PORTACATH PLACEMENT N/A 03/25/2017   Procedure: INSERTION PORT-A-CATH, right internal jugular;  Surgeon: Melrose Nakayama, MD;  Location: [facility];  Service: Thoracic;  Laterality: N/A;  . THYROIDECTOMY  2001   S/P RAI  . VIDEO ASSISTED THORACOSCOPY Left 02/15/2017   Procedure: VIDEO ASSISTED THORACOSCOPY;  Surgeon: Melrose Nakayama, MD;  Location: [facility];  Service: Thoracic;  Laterality: Left;    REVIEW OF SYSTEMS:  Constitutional: negative Eyes: negative Ears, nose, mouth, throat, and face: negative Respiratory: positive for pleurisy/chest pain Cardiovascular: negative Gastrointestinal: negative Genitourinary:negative Integument/breast: negative Hematologic/lymphatic: negative Musculoskeletal:negative Neurological: negative Behavioral/Psych: negative Endocrine: negative Allergic/Immunologic: negative   PHYSICAL EXAMINATION: General appearance: alert, cooperative and no distress Head: Normocephalic, without obvious abnormality, atraumatic Neck: no adenopathy, no JVD, supple, symmetrical, trachea midline and thyroid not enlarged, symmetric, no tenderness/mass/nodules Lymph nodes: Cervical, supraclavicular, and axillary nodes normal. Resp: clear to auscultation bilaterally Back: symmetric, no curvature. ROM normal. No CVA tenderness. Cardio: regular rate and rhythm, S1, S2 normal, no murmur, click, rub or gallop GI: soft, non-tender; bowel sounds normal; no masses,  no organomegaly Extremities: extremities normal, atraumatic, no cyanosis or edema Neurologic: Alert and oriented X 3, normal strength and tone. Normal symmetric reflexes. Normal coordination and  gait  ECOG PERFORMANCE STATUS: 1 - Symptomatic but completely ambulatory  Blood pressure 130/83, pulse 72, temperature 97.7 F (36.5 C), temperature source Oral, resp. rate 18, height 5\' 11"  (1.803 m), weight 205 lb 11.2 oz (93.3 kg), SpO2 96 %.  LABORATORY DATA: Lab Results  Component Value Date   WBC 6.4 06/02/2017   HGB 13.8 06/02/2017   HCT 38.9 06/02/2017   MCV 94.6 06/02/2017   PLT 219 06/02/2017      Chemistry      Component Value Date/Time   NA 133 (L) 06/02/2017 1125   K 4.7 06/02/2017 1125   CL 103 03/25/2017 0618   CO2 23 06/02/2017 1125   BUN 12.5 06/02/2017 1125   CREATININE 0.9 06/02/2017 1125      Component Value Date/Time   CALCIUM 9.5 06/02/2017 1125   ALKPHOS 58 06/02/2017 1125   AST 30 06/02/2017 1125   ALT 31 06/02/2017 1125   BILITOT 0.74 06/02/2017 1125       RADIOGRAPHIC STUDIES: Ct Chest W Contrast  Result Date: 05/31/2017 CLINICAL DATA:  Stage II malignant pleural mesothelioma. EXAM: CT CHEST WITH CONTRAST TECHNIQUE: Multidetector CT imaging of the chest was performed during intravenous contrast administration. CONTRAST:  62mL ISOVUE-300 IOPAMIDOL (ISOVUE-300) INJECTION 61% COMPARISON:  PET-CT 03/17/2017.  Chest CT 02/12/2017 FINDINGS: Cardiovascular: The heart size is  normal. No pericardial effusion. Coronary artery calcification is noted. Atherosclerotic calcification is noted in the wall of the thoracic aorta. Right Port-A-Cath tip is positioned in the mid SVC. Mediastinum/Nodes: The 9 mm short axis high left paratracheal lymph node described as just below the left thoracic inlet on the prior PET-CT is unchanged in the interval. Pleural thickening seen previously along the left mediastinum has decreased. The 11 mm local nodularity along the right pericardial region measured on the previous study has decreased, now measuring 6 mm. Stable 11 mm short axis precarinal lymph node. There is no hilar lymphadenopathy. Tiny hiatal hernia. The esophagus has  normal imaging features. There is no axillary lymphadenopathy. Lungs/Pleura: Centrilobular emphysema again noted. Subsegmental atelectasis anterior right lower lobe. Minimal thickening noted along the left major fissure is clearly improved since 03/17/2017. Suture line again noted left lung base. Upper Abdomen: Tiny calcified gallstone evident. Otherwise unremarkable. Musculoskeletal: Bone windows reveal no worrisome lytic or sclerotic osseous lesions. IMPRESSION: 1. Interval decrease and left pleural thickening and nodularity, most noticeable along the left major fissure. 2. The hypermetabolic lymph node seen in the upper left mediastinum just below the thoracic inlet on prior PET-CT is stable in the interval. Emphysema. (OLI10-V01.9) Aortic Atherosclerois (ICD10-170.0) Electronically Signed   By: Misty Stanley M.D.   On: 05/31/2017 13:56    ASSESSMENT AND PLAN:  This is a very pleasant 83 years old white male with malignant pleural mesothelioma involving the left hemothorax. He is currently undergoing treatment with systemic chemotherapy with carboplatin, Alimta and Avastin status post 3 cycles. He has been tolerating the treatment fairly well with no significant adverse effects. He had repeat CT scan of the chest performed recently. I personally and independently reviewed the scan images and discuss the results with the patient and his wife. The scan showed improvement of his disease with decrease in the left pleural thickening and nodularity. I recommend for the patient to continue his current treatment with the same regimen and he will proceed with cycle #4 today. I will see him back for follow-up visit in 3 weeks for reevaluation before starting cycle #5. He was advised to call immediately if he has any concerning symptoms in the interval. The patient voices understanding of current disease status and treatment options and is in agreement with the current care plan. All questions were answered. The  patient knows to call the clinic with any problems, questions or concerns. We can certainly see the patient much sooner if necessary. I spent 15 minutes counseling the patient face to face. The total time spent in the appointment was 25 minutes.  Disclaimer: This note was dictated with voice recognition software. Similar sounding words can inadvertently be transcribed and may not be corrected upon review.

## 2017-06-02 NOTE — Patient Instructions (Signed)
[facility] Discharge Instructions for Patients Receiving Chemotherapy  Today you received the following chemotherapy agents Avastin/Alimta/Carboplatin  To help prevent nausea and vomiting after your treatment, we encourage you to take your nausea medication    If you develop nausea and vomiting that is not controlled by your nausea medication, call the clinic.   BELOW ARE SYMPTOMS THAT SHOULD BE REPORTED IMMEDIATELY:  *FEVER GREATER THAN 100.5 F  *CHILLS WITH OR WITHOUT FEVER  NAUSEA AND VOMITING THAT IS NOT CONTROLLED WITH YOUR NAUSEA MEDICATION  *UNUSUAL SHORTNESS OF BREATH  *UNUSUAL BRUISING OR BLEEDING  TENDERNESS IN MOUTH AND THROAT WITH OR WITHOUT PRESENCE OF ULCERS  *URINARY PROBLEMS  *BOWEL PROBLEMS  UNUSUAL RASH Items with * indicate a potential emergency and should be followed up as soon as possible.  Feel free to call the clinic you have any questions or concerns. The clinic phone number is [phone].  Please show the [facility] at check-in to the Emergency Department and triage nurse.

## 2017-06-02 NOTE — Patient Instructions (Signed)
Implanted Port Insertion, Care After °This sheet gives you information about how to care for yourself after your procedure. Your health care provider may also give you more specific instructions. If you have problems or questions, contact your health care provider. °What can I expect after the procedure? °After your procedure, it is common to have: °· Discomfort at the port insertion site. °· Bruising on the skin over the port. This should improve over 3-4 days. ° °Follow these instructions at home: °Port care °· After your port is placed, you will get a manufacturer's information card. The card has information about your port. Keep this card with you at all times. °· Take care of the port as told by your health care provider. Ask your health care provider if you or a family member can get training for taking care of the port at home. A home health care nurse may also take care of the port. °· Make sure to remember what type of port you have. °Incision care °· Follow instructions from your health care provider about how to take care of your port insertion site. Make sure you: °? Wash your hands with soap and water before you change your bandage (dressing). If soap and water are not available, use hand sanitizer. °? Change your dressing as told by your health care provider. °? Leave stitches (sutures), skin glue, or adhesive strips in place. These skin closures may need to stay in place for 2 weeks or longer. If adhesive strip edges start to loosen and curl up, you may trim the loose edges. Do not remove adhesive strips completely unless your health care provider tells you to do that. °· Check your port insertion site every day for signs of infection. Check for: °? More redness, swelling, or pain. °? More fluid or blood. °? Warmth. °? Pus or a bad smell. °General instructions °· Do not take baths, swim, or use a hot tub until your health care provider approves. °· Do not lift anything that is heavier than 10 lb (4.5  kg) for a week, or as told by your health care provider. °· Ask your health care provider when it is okay to: °? Return to work or school. °? Resume usual physical activities or sports. °· Do not drive for 24 hours if you were given a medicine to help you relax (sedative). °· Take over-the-counter and prescription medicines only as told by your health care provider. °· Wear a medical alert bracelet in case of an emergency. This will tell any health care providers that you have a port. °· Keep all follow-up visits as told by your health care provider. This is important. °Contact a health care provider if: °· You cannot flush your port with saline as directed, or you cannot draw blood from the port. °· You have a fever or chills. °· You have more redness, swelling, or pain around your port insertion site. °· You have more fluid or blood coming from your port insertion site. °· Your port insertion site feels warm to the touch. °· You have pus or a bad smell coming from the port insertion site. °Get help right away if: °· You have chest pain or shortness of breath. °· You have bleeding from your port that you cannot control. °Summary °· Take care of the port as told by your health care provider. °· Change your dressing as told by your health care provider. °· Keep all follow-up visits as told by your health care provider. °  This information is not intended to replace advice given to you by your health care provider. Make sure you discuss any questions you have with your health care provider. °Document Released: 09/13/2013 Document Revised: 10/14/2016 Document Reviewed: 10/14/2016 °Elsevier Interactive Patient Education © 2017 [employer] ° °

## 2017-06-07 ENCOUNTER — Telehealth: Payer: Self-pay | Admitting: Internal Medicine

## 2017-06-07 NOTE — Telephone Encounter (Signed)
No additional appts scheduled per 6/27 los - appts already scheduled per treatment plan.

## 2017-06-08 DIAGNOSIS — R350 Frequency of micturition: Secondary | ICD-10-CM | POA: Diagnosis not present

## 2017-06-08 DIAGNOSIS — N3941 Urge incontinence: Secondary | ICD-10-CM | POA: Diagnosis not present

## 2017-06-10 ENCOUNTER — Other Ambulatory Visit (HOSPITAL_BASED_OUTPATIENT_CLINIC_OR_DEPARTMENT_OTHER): Payer: PPO

## 2017-06-10 DIAGNOSIS — C450 Mesothelioma of pleura: Secondary | ICD-10-CM | POA: Diagnosis not present

## 2017-06-10 LAB — CBC WITH DIFFERENTIAL/PLATELET
BASO%: 0.4 % (ref 0.0–2.0)
BASOS ABS: 0 10*3/uL (ref 0.0–0.1)
EOS%: 1.9 % (ref 0.0–7.0)
Eosinophils Absolute: 0.1 10*3/uL (ref 0.0–0.5)
HCT: 38.9 % (ref 38.4–49.9)
HGB: 13.4 g/dL (ref 13.0–17.1)
LYMPH%: 47.7 % (ref 14.0–49.0)
MCH: 33.3 pg (ref 27.2–33.4)
MCHC: 34.4 g/dL (ref 32.0–36.0)
MCV: 96.5 fL (ref 79.3–98.0)
MONO#: 0.3 10*3/uL (ref 0.1–0.9)
MONO%: 12.5 % (ref 0.0–14.0)
NEUT#: 1 10*3/uL — ABNORMAL LOW (ref 1.5–6.5)
NEUT%: 37.5 % — AB (ref 39.0–75.0)
PLATELETS: 97 10*3/uL — AB (ref 140–400)
RBC: 4.03 10*6/uL — AB (ref 4.20–5.82)
RDW: 17.3 % — ABNORMAL HIGH (ref 11.0–14.6)
WBC: 2.6 10*3/uL — ABNORMAL LOW (ref 4.0–10.3)
lymph#: 1.3 10*3/uL (ref 0.9–3.3)

## 2017-06-10 LAB — COMPREHENSIVE METABOLIC PANEL
ALT: 22 U/L (ref 0–55)
ANION GAP: 6 meq/L (ref 3–11)
AST: 29 U/L (ref 5–34)
Albumin: 3.6 g/dL (ref 3.5–5.0)
Alkaline Phosphatase: 58 U/L (ref 40–150)
BUN: 15.2 mg/dL (ref 7.0–26.0)
CALCIUM: 9.2 mg/dL (ref 8.4–10.4)
CHLORIDE: 103 meq/L (ref 98–109)
CO2: 28 meq/L (ref 22–29)
CREATININE: 0.8 mg/dL (ref 0.7–1.3)
EGFR: 82 mL/min/{1.73_m2} — ABNORMAL LOW (ref >90)
Glucose: 108 mg/dl (ref 70–140)
POTASSIUM: 4.3 meq/L (ref 3.5–5.1)
Sodium: 138 mEq/L (ref 136–145)
Total Bilirubin: 0.54 mg/dL (ref 0.20–1.20)
Total Protein: 6.2 g/dL — ABNORMAL LOW (ref 6.4–8.3)

## 2017-06-16 ENCOUNTER — Other Ambulatory Visit (HOSPITAL_BASED_OUTPATIENT_CLINIC_OR_DEPARTMENT_OTHER): Payer: PPO

## 2017-06-16 DIAGNOSIS — C450 Mesothelioma of pleura: Secondary | ICD-10-CM

## 2017-06-16 LAB — CBC WITH DIFFERENTIAL/PLATELET
BASO%: 0.3 % (ref 0.0–2.0)
Basophils Absolute: 0 10*3/uL (ref 0.0–0.1)
EOS%: 1.2 % (ref 0.0–7.0)
Eosinophils Absolute: 0 10*3/uL (ref 0.0–0.5)
HCT: 39.8 % (ref 38.4–49.9)
HGB: 13.7 g/dL (ref 13.0–17.1)
LYMPH%: 41.3 % (ref 14.0–49.0)
MCH: 33.5 pg — ABNORMAL HIGH (ref 27.2–33.4)
MCHC: 34.4 g/dL (ref 32.0–36.0)
MCV: 97.3 fL (ref 79.3–98.0)
MONO#: 0.3 10*3/uL (ref 0.1–0.9)
MONO%: 9.2 % (ref 0.0–14.0)
NEUT%: 48 % (ref 39.0–75.0)
NEUTROS ABS: 1.7 10*3/uL (ref 1.5–6.5)
Platelets: 86 10*3/uL — ABNORMAL LOW (ref 140–400)
RBC: 4.09 10*6/uL — ABNORMAL LOW (ref 4.20–5.82)
RDW: 18.7 % — ABNORMAL HIGH (ref 11.0–14.6)
WBC: 3.5 10*3/uL — AB (ref 4.0–10.3)
lymph#: 1.4 10*3/uL (ref 0.9–3.3)

## 2017-06-16 LAB — COMPREHENSIVE METABOLIC PANEL
ALT: 27 U/L (ref 0–55)
AST: 32 U/L (ref 5–34)
Albumin: 3.8 g/dL (ref 3.5–5.0)
Alkaline Phosphatase: 57 U/L (ref 40–150)
Anion Gap: 10 mEq/L (ref 3–11)
BILIRUBIN TOTAL: 0.87 mg/dL (ref 0.20–1.20)
BUN: 8.3 mg/dL (ref 7.0–26.0)
CHLORIDE: 105 meq/L (ref 98–109)
CO2: 24 meq/L (ref 22–29)
CREATININE: 1 mg/dL (ref 0.7–1.3)
Calcium: 9.2 mg/dL (ref 8.4–10.4)
EGFR: 72 mL/min/{1.73_m2} — ABNORMAL LOW (ref >90)
GLUCOSE: 137 mg/dL (ref 70–140)
Potassium: 4.5 mEq/L (ref 3.5–5.1)
SODIUM: 139 meq/L (ref 136–145)
TOTAL PROTEIN: 6.4 g/dL (ref 6.4–8.3)

## 2017-06-16 LAB — UA PROTEIN, DIPSTICK - CHCC: Protein, ur: <30 mg/dL

## 2017-06-21 ENCOUNTER — Telehealth: Payer: Self-pay | Admitting: Internal Medicine

## 2017-06-21 MED ORDER — LEVOTHYROXINE SODIUM 150 MCG PO TABS: ORAL_TABLET | ORAL | 0 refills | Status: DC

## 2017-06-21 NOTE — Telephone Encounter (Signed)
Pt wife called in and made a cpe appt on 8/28 .  Pt is going though chemo and can get here till aug.  He is needing a refill on his thyroid meds.  He is completely out    Pharmacy , [facility] on file

## 2017-06-23 ENCOUNTER — Other Ambulatory Visit (HOSPITAL_BASED_OUTPATIENT_CLINIC_OR_DEPARTMENT_OTHER): Payer: PPO

## 2017-06-23 ENCOUNTER — Other Ambulatory Visit: Payer: PPO

## 2017-06-23 ENCOUNTER — Ambulatory Visit (HOSPITAL_BASED_OUTPATIENT_CLINIC_OR_DEPARTMENT_OTHER): Payer: PPO

## 2017-06-23 ENCOUNTER — Encounter: Payer: Self-pay | Admitting: Internal Medicine

## 2017-06-23 ENCOUNTER — Ambulatory Visit: Payer: PPO

## 2017-06-23 ENCOUNTER — Ambulatory Visit (HOSPITAL_BASED_OUTPATIENT_CLINIC_OR_DEPARTMENT_OTHER): Payer: PPO | Admitting: Internal Medicine

## 2017-06-23 VITALS — BP 154/99 | HR 76 | Temp 98.0°F | Resp 18 | Ht 71.0 in | Wt 209.8 lb

## 2017-06-23 VITALS — BP 144/86

## 2017-06-23 DIAGNOSIS — Z5111 Encounter for antineoplastic chemotherapy: Secondary | ICD-10-CM

## 2017-06-23 DIAGNOSIS — C450 Mesothelioma of pleura: Secondary | ICD-10-CM

## 2017-06-23 DIAGNOSIS — Z5112 Encounter for antineoplastic immunotherapy: Secondary | ICD-10-CM | POA: Diagnosis not present

## 2017-06-23 DIAGNOSIS — I10 Essential (primary) hypertension: Secondary | ICD-10-CM | POA: Diagnosis not present

## 2017-06-23 LAB — COMPREHENSIVE METABOLIC PANEL
ALBUMIN: 3.8 g/dL (ref 3.5–5.0)
ALK PHOS: 58 U/L (ref 40–150)
ALT: 25 U/L (ref 0–55)
AST: 30 U/L (ref 5–34)
Anion Gap: 15 mEq/L — ABNORMAL HIGH (ref 3–11)
BUN: 13.3 mg/dL (ref 7.0–26.0)
CALCIUM: 9.5 mg/dL (ref 8.4–10.4)
CHLORIDE: 104 meq/L (ref 98–109)
CO2: 18 mEq/L — ABNORMAL LOW (ref 22–29)
Creatinine: 1 mg/dL (ref 0.7–1.3)
EGFR: 73 mL/min/{1.73_m2} — AB (ref >90)
Glucose: 208 mg/dl — ABNORMAL HIGH (ref 70–140)
POTASSIUM: 4.3 meq/L (ref 3.5–5.1)
Sodium: 136 mEq/L (ref 136–145)
Total Bilirubin: 0.94 mg/dL (ref 0.20–1.20)
Total Protein: 6.8 g/dL (ref 6.4–8.3)

## 2017-06-23 LAB — CBC WITH DIFFERENTIAL/PLATELET
BASO%: 0 % (ref 0.0–2.0)
BASOS ABS: 0 10*3/uL (ref 0.0–0.1)
EOS%: 0 % (ref 0.0–7.0)
Eosinophils Absolute: 0 10*3/uL (ref 0.0–0.5)
HEMATOCRIT: 37.9 % — AB (ref 38.4–49.9)
HEMOGLOBIN: 13.4 g/dL (ref 13.0–17.1)
LYMPH#: 0.8 10*3/uL — AB (ref 0.9–3.3)
LYMPH%: 18.9 % (ref 14.0–49.0)
MCH: 34.2 pg — AB (ref 27.2–33.4)
MCHC: 35.4 g/dL (ref 32.0–36.0)
MCV: 96.7 fL (ref 79.3–98.0)
MONO#: 0.3 10*3/uL (ref 0.1–0.9)
MONO%: 7.3 % (ref 0.0–14.0)
NEUT#: 3.2 10*3/uL (ref 1.5–6.5)
NEUT%: 73.8 % (ref 39.0–75.0)
Platelets: 191 10*3/uL (ref 140–400)
RBC: 3.92 10*6/uL — ABNORMAL LOW (ref 4.20–5.82)
RDW: 20.2 % — ABNORMAL HIGH (ref 11.0–14.6)
WBC: 4.4 10*3/uL (ref 4.0–10.3)
nRBC: 1 % — ABNORMAL HIGH (ref 0–0)

## 2017-06-23 MED ORDER — PALONOSETRON HCL INJECTION 0.25 MG/5ML
INTRAVENOUS | Status: AC
  Filled 2017-06-23: qty 5

## 2017-06-23 MED ORDER — SODIUM CHLORIDE 0.9% FLUSH
10.0000 mL | INTRAVENOUS | Status: DC | PRN
  Administered 2017-06-23: 10 mL
  Filled 2017-06-23: qty 10

## 2017-06-23 MED ORDER — SODIUM CHLORIDE 0.9 % IV SOLN
Freq: Once | INTRAVENOUS | Status: AC
  Administered 2017-06-23: 13:00:00 via INTRAVENOUS
  Filled 2017-06-23: qty 5

## 2017-06-23 MED ORDER — SODIUM CHLORIDE 0.9 % IV SOLN
520.0000 mg/m2 | Freq: Once | INTRAVENOUS | Status: AC
  Administered 2017-06-23: 1100 mg via INTRAVENOUS
  Filled 2017-06-23: qty 40

## 2017-06-23 MED ORDER — HEPARIN SOD (PORK) LOCK FLUSH 100 UNIT/ML IV SOLN
500.0000 [IU] | Freq: Once | INTRAVENOUS | Status: AC | PRN
  Administered 2017-06-23: 500 [IU]
  Filled 2017-06-23: qty 5

## 2017-06-23 MED ORDER — PALONOSETRON HCL INJECTION 0.25 MG/5ML
0.2500 mg | Freq: Once | INTRAVENOUS | Status: AC
  Administered 2017-06-23: 0.25 mg via INTRAVENOUS

## 2017-06-23 MED ORDER — SODIUM CHLORIDE 0.9 % IV SOLN
499.5000 mg | Freq: Once | INTRAVENOUS | Status: AC
  Administered 2017-06-23: 500 mg via INTRAVENOUS
  Filled 2017-06-23: qty 50

## 2017-06-23 MED ORDER — SODIUM CHLORIDE 0.9 % IV SOLN
Freq: Once | INTRAVENOUS | Status: AC
  Administered 2017-06-23: 12:00:00 via INTRAVENOUS

## 2017-06-23 MED ORDER — SODIUM CHLORIDE 0.9 % IV SOLN
1400.0000 mg | Freq: Once | INTRAVENOUS | Status: AC
  Administered 2017-06-23: 1400 mg via INTRAVENOUS
  Filled 2017-06-23: qty 48

## 2017-06-23 NOTE — Patient Instructions (Signed)
[facility] Discharge Instructions for Patients Receiving Chemotherapy  Today you received the following chemotherapy agents Avastin, Carboplatin and Alimta   To help prevent nausea and vomiting after your treatment, we encourage you to take your nausea medication as directed. No Zofran for 3 days. Take Compazine instead.    If you develop nausea and vomiting that is not controlled by your nausea medication, call the clinic.   BELOW ARE SYMPTOMS THAT SHOULD BE REPORTED IMMEDIATELY:  *FEVER GREATER THAN 100.5 F  *CHILLS WITH OR WITHOUT FEVER  NAUSEA AND VOMITING THAT IS NOT CONTROLLED WITH YOUR NAUSEA MEDICATION  *UNUSUAL SHORTNESS OF BREATH  *UNUSUAL BRUISING OR BLEEDING  TENDERNESS IN MOUTH AND THROAT WITH OR WITHOUT PRESENCE OF ULCERS  *URINARY PROBLEMS  *BOWEL PROBLEMS  UNUSUAL RASH Items with * indicate a potential emergency and should be followed up as soon as possible.  Feel free to call the clinic you have any questions or concerns. The clinic phone number is [phone].  Please show the [facility] at check-in to the Emergency Department and triage nurse.

## 2017-06-23 NOTE — Progress Notes (Signed)
[facility] [phone]   [phone]  OFFICE PROGRESS NOTE  [name], MD [place] [address]  DIAGNOSIS: Stage II malignant pleural mesothelioma, epithelioid type involving the left hemithorax diagnosed in March 2018.  PRIOR THERAPY: Status post left VATS with biopsy and wedge resection of the left lower lobe as well as parietal pleurectomy under the care of Dr. Roxan Hockey on 02/15/2017.  CURRENT THERAPY: Palliative systemic chemotherapy with carboplatin for AUC of 5, Alimta 500 MG/M2 and Avastin 15 MG/KG every 3 weeks. Status post 4 cycles.  INTERVAL HISTORY: Dean Pruitt 83 y.o. male returns to the clinic today for follow-up visit accompanied by his wife. The patient is currently on systemic chemotherapy with carboplatin, Alimta and Avastin status post 4 cycles and has been treating this treatment fairly well. He denied having any chest pain, shortness of breath, cough or hemoptysis. He denied having any weight loss or night sweats. He has no nausea, vomiting, diarrhea or constipation. He is here today for evaluation before starting cycle #5.  MEDICAL HISTORY: Past Medical History:  Diagnosis Date  . Cataract   . Depression    situational  . GERD (gastroesophageal reflux disease)   . Goals of care, counseling/discussion 03/20/2017  . Hyperlipidemia   . Hypothyroidism   . Reactive depression 03/20/2017  . Thyroid cancer ([facility])    PMH of; on supressive therapy    ALLERGIES:  is allergic to ativan [lorazepam]; tramadol; and fentanyl.  MEDICATIONS:  Current Outpatient Prescriptions  Medication Sig Dispense Refill  . aspirin 81 MG tablet Take 81 mg by mouth daily.    Marland Kitchen dexamethasone (DECADRON) 4 MG tablet 4 mg by mouth twice a day the day before, day of and day after the chemotherapy every 3 weeks. 40 tablet 1  . diphenhydrAMINE (BENADRYL) 25 MG tablet Take 50 mg by mouth at bedtime.     Marland Kitchen escitalopram (LEXAPRO)  10 MG tablet TAKE ONE TABLET BY MOUTH ONCE DAILY AT BEDTIME 30 tablet 3  . folic acid (FOLVITE) 1 MG tablet Take 1 tablet (1 mg total) by mouth daily. 30 tablet 4  . gabapentin (NEURONTIN) 300 MG capsule TAKE ONE CAPSULE BY MOUTH AT NIGHT 30 capsule 3  . levothyroxine (SYNTHROID, LEVOTHROID) 150 MCG tablet TAKE 1 TABLET (150 mcg) BY MOUTH 3 days a week, 75 mcg 4 days a week 45 tablet 0  . lidocaine-prilocaine (EMLA) cream Apply 1 application topically as needed. (Patient taking differently: Apply 1 application topically as needed (port access). ) 30 g 0  . omeprazole (PRILOSEC) 20 MG capsule Take 20 mg by mouth every other day.    . Probiotic Product (PROBIOTIC PO) Take 1 tablet by mouth 2 (two) times daily.     . rosuvastatin (CRESTOR) 20 MG tablet Take 1 tablet (20 mg total) by mouth daily. (Patient taking differently: Take 20 mg by mouth every other day. ) 90 tablet 3  . oxyCODONE (OXY IR/ROXICODONE) 5 MG immediate release tablet Take 1 tablet (5 mg total) by mouth every 6 (six) hours as needed for severe pain. (Patient not taking: Reported on 06/23/2017) 30 tablet 0  . prochlorperazine (COMPAZINE) 10 MG tablet Take 1 tablet (10 mg total) by mouth every 6 (six) hours as needed for nausea or vomiting. (Patient not taking: Reported on 06/23/2017) 30 tablet 0   No current facility-administered medications for this visit.     SURGICAL HISTORY:  Past Surgical History:  Procedure  Laterality Date  . CATARACT EXTRACTION, BILATERAL  2013 & 2014   Dr Celene Squibb  . colon polyps  2002 & 2005   negative 2008; Dr Earlean Shawl  . COLONOSCOPY W/ POLYPECTOMY    . LUNG BIOPSY Left 02/15/2017   Procedure: LUNG BIOPSY;  Surgeon: Melrose Nakayama, MD;  Location: [facility];  Service: Thoracic;  Laterality: Left;  . PLEURAL EFFUSION DRAINAGE Left 02/15/2017   Procedure: DRAINAGE OF PLEURAL EFFUSION;  Surgeon: Melrose Nakayama, MD;  Location: [facility];  Service: Thoracic;  Laterality: Left;  . PORTACATH PLACEMENT N/A  03/25/2017   Procedure: INSERTION PORT-A-CATH, right internal jugular;  Surgeon: Melrose Nakayama, MD;  Location: [facility];  Service: Thoracic;  Laterality: N/A;  . THYROIDECTOMY  2001   S/P RAI  . VIDEO ASSISTED THORACOSCOPY Left 02/15/2017   Procedure: VIDEO ASSISTED THORACOSCOPY;  Surgeon: Melrose Nakayama, MD;  Location: [facility];  Service: Thoracic;  Laterality: Left;    REVIEW OF SYSTEMS:  A comprehensive review of systems was negative.   PHYSICAL EXAMINATION: General appearance: alert, cooperative and no distress Head: Normocephalic, without obvious abnormality, atraumatic Neck: no adenopathy, no JVD, supple, symmetrical, trachea midline and thyroid not enlarged, symmetric, no tenderness/mass/nodules Lymph nodes: Cervical, supraclavicular, and axillary nodes normal. Resp: clear to auscultation bilaterally Back: symmetric, no curvature. ROM normal. No CVA tenderness. Cardio: regular rate and rhythm, S1, S2 normal, no murmur, click, rub or gallop GI: soft, non-tender; bowel sounds normal; no masses,  no organomegaly Extremities: extremities normal, atraumatic, no cyanosis or edema  ECOG PERFORMANCE STATUS: 1 - Symptomatic but completely ambulatory  Blood pressure (!) 154/99, pulse 76, temperature 98 F (36.7 C), temperature source Oral, resp. rate 18, height 5\' 11"  (1.803 m), weight 209 lb 12.8 oz (95.2 kg), SpO2 96 %.  LABORATORY DATA: Lab Results  Component Value Date   WBC 4.4 06/23/2017   HGB 13.4 06/23/2017   HCT 37.9 (L) 06/23/2017   MCV 96.7 06/23/2017   PLT 191 06/23/2017      Chemistry      Component Value Date/Time   NA 136 06/23/2017 0949   K 4.3 06/23/2017 0949   CL 103 03/25/2017 0618   CO2 18 (L) 06/23/2017 0949   BUN 13.3 06/23/2017 0949   CREATININE 1.0 06/23/2017 0949      Component Value Date/Time   CALCIUM 9.5 06/23/2017 0949   ALKPHOS 58 06/23/2017 0949   AST 30 06/23/2017 0949   ALT 25 06/23/2017 0949   BILITOT 0.94 06/23/2017 0949        RADIOGRAPHIC STUDIES: Ct Chest W Contrast  Result Date: 05/31/2017 CLINICAL DATA:  Stage II malignant pleural mesothelioma. EXAM: CT CHEST WITH CONTRAST TECHNIQUE: Multidetector CT imaging of the chest was performed during intravenous contrast administration. CONTRAST:  66mL ISOVUE-300 IOPAMIDOL (ISOVUE-300) INJECTION 61% COMPARISON:  PET-CT 03/17/2017.  Chest CT 02/12/2017 FINDINGS: Cardiovascular: The heart size is normal. No pericardial effusion. Coronary artery calcification is noted. Atherosclerotic calcification is noted in the wall of the thoracic aorta. Right Port-A-Cath tip is positioned in the mid SVC. Mediastinum/Nodes: The 9 mm short axis high left paratracheal lymph node described as just below the left thoracic inlet on the prior PET-CT is unchanged in the interval. Pleural thickening seen previously along the left mediastinum has decreased. The 11 mm local nodularity along the right pericardial region measured on the previous study has decreased, now measuring 6 mm. Stable 11 mm short axis precarinal lymph node. There is no hilar lymphadenopathy. Tiny hiatal hernia.  The esophagus has normal imaging features. There is no axillary lymphadenopathy. Lungs/Pleura: Centrilobular emphysema again noted. Subsegmental atelectasis anterior right lower lobe. Minimal thickening noted along the left major fissure is clearly improved since 03/17/2017. Suture line again noted left lung base. Upper Abdomen: Tiny calcified gallstone evident. Otherwise unremarkable. Musculoskeletal: Bone windows reveal no worrisome lytic or sclerotic osseous lesions. IMPRESSION: 1. Interval decrease and left pleural thickening and nodularity, most noticeable along the left major fissure. 2. The hypermetabolic lymph node seen in the upper left mediastinum just below the thoracic inlet on prior PET-CT is stable in the interval. Emphysema. ([id number]) Aortic Atherosclerois (ICD10-170.0) Electronically Signed   By: Misty Stanley M.D.   On: 05/31/2017 13:56    ASSESSMENT AND PLAN:  This is a very pleasant 83 years old white male with malignant pleural mesothelioma involving the left hemothorax. He is currently undergoing treatment with systemic chemotherapy with carboplatin, Alimta and Avastin status post 4 cycles. He has been tolerating the treatment fairly well with no significant adverse effects. I recommended for the patient to proceed with cycle #5 today as scheduled. For hypertension, I strongly advise him to cut on his salt intake. If no improvement in his blood pressure with diet modification, I would consider the patient for treatment with Norvasc. I will see him back for follow-up visit in 3 weeks for evaluation before starting cycle #6. He was advised to call immediately if he has any concerning symptoms in the interval. The patient voices understanding of current disease status and treatment options and is in agreement with the current care plan. All questions were answered. The patient knows to call the clinic with any problems, questions or concerns. We can certainly see the patient much sooner if necessary. I spent 10 minutes counseling the patient face to face. The total time spent in the appointment was 15 minutes. Disclaimer: This note was dictated with voice recognition software. Similar sounding words can inadvertently be transcribed and may not be corrected upon review.

## 2017-06-23 NOTE — Patient Instructions (Signed)
Implanted Port Insertion, Care After °This sheet gives you information about how to care for yourself after your procedure. Your health care provider may also give you more specific instructions. If you have problems or questions, contact your health care provider. °What can I expect after the procedure? °After your procedure, it is common to have: °· Discomfort at the port insertion site. °· Bruising on the skin over the port. This should improve over 3-4 days. ° °Follow these instructions at home: °Port care °· After your port is placed, you will get a manufacturer's information card. The card has information about your port. Keep this card with you at all times. °· Take care of the port as told by your health care provider. Ask your health care provider if you or a family member can get training for taking care of the port at home. A home health care nurse may also take care of the port. °· Make sure to remember what type of port you have. °Incision care °· Follow instructions from your health care provider about how to take care of your port insertion site. Make sure you: °? Wash your hands with soap and water before you change your bandage (dressing). If soap and water are not available, use hand sanitizer. °? Change your dressing as told by your health care provider. °? Leave stitches (sutures), skin glue, or adhesive strips in place. These skin closures may need to stay in place for 2 weeks or longer. If adhesive strip edges start to loosen and curl up, you may trim the loose edges. Do not remove adhesive strips completely unless your health care provider tells you to do that. °· Check your port insertion site every day for signs of infection. Check for: °? More redness, swelling, or pain. °? More fluid or blood. °? Warmth. °? Pus or a bad smell. °General instructions °· Do not take baths, swim, or use a hot tub until your health care provider approves. °· Do not lift anything that is heavier than 10 lb (4.5  kg) for a week, or as told by your health care provider. °· Ask your health care provider when it is okay to: °? Return to work or school. °? Resume usual physical activities or sports. °· Do not drive for 24 hours if you were given a medicine to help you relax (sedative). °· Take over-the-counter and prescription medicines only as told by your health care provider. °· Wear a medical alert bracelet in case of an emergency. This will tell any health care providers that you have a port. °· Keep all follow-up visits as told by your health care provider. This is important. °Contact a health care provider if: °· You cannot flush your port with saline as directed, or you cannot draw blood from the port. °· You have a fever or chills. °· You have more redness, swelling, or pain around your port insertion site. °· You have more fluid or blood coming from your port insertion site. °· Your port insertion site feels warm to the touch. °· You have pus or a bad smell coming from the port insertion site. °Get help right away if: °· You have chest pain or shortness of breath. °· You have bleeding from your port that you cannot control. °Summary °· Take care of the port as told by your health care provider. °· Change your dressing as told by your health care provider. °· Keep all follow-up visits as told by your health care provider. °  This information is not intended to replace advice given to you by your health care provider. Make sure you discuss any questions you have with your health care provider. °Document Released: 09/13/2013 Document Revised: 10/14/2016 Document Reviewed: 10/14/2016 °Elsevier Interactive Patient Education © 2017 [employer] ° °

## 2017-06-29 DIAGNOSIS — N3941 Urge incontinence: Secondary | ICD-10-CM | POA: Diagnosis not present

## 2017-06-29 DIAGNOSIS — R350 Frequency of micturition: Secondary | ICD-10-CM | POA: Diagnosis not present

## 2017-06-30 ENCOUNTER — Other Ambulatory Visit (HOSPITAL_BASED_OUTPATIENT_CLINIC_OR_DEPARTMENT_OTHER): Payer: PPO

## 2017-06-30 DIAGNOSIS — C450 Mesothelioma of pleura: Secondary | ICD-10-CM

## 2017-06-30 LAB — COMPREHENSIVE METABOLIC PANEL
ALT: 22 U/L (ref 0–55)
AST: 29 U/L (ref 5–34)
Albumin: 3.7 g/dL (ref 3.5–5.0)
Alkaline Phosphatase: 56 U/L (ref 40–150)
Anion Gap: 9 mEq/L (ref 3–11)
BUN: 17.6 mg/dL (ref 7.0–26.0)
CHLORIDE: 104 meq/L (ref 98–109)
CO2: 26 meq/L (ref 22–29)
Calcium: 9.2 mg/dL (ref 8.4–10.4)
Creatinine: 0.9 mg/dL (ref 0.7–1.3)
EGFR: 81 mL/min/{1.73_m2} — ABNORMAL LOW (ref >90)
GLUCOSE: 125 mg/dL (ref 70–140)
POTASSIUM: 3.8 meq/L (ref 3.5–5.1)
SODIUM: 139 meq/L (ref 136–145)
Total Bilirubin: 1.15 mg/dL (ref 0.20–1.20)
Total Protein: 6.3 g/dL — ABNORMAL LOW (ref 6.4–8.3)

## 2017-06-30 LAB — CBC WITH DIFFERENTIAL/PLATELET
BASO%: 0.5 % (ref 0.0–2.0)
BASOS ABS: 0 10*3/uL (ref 0.0–0.1)
EOS%: 2.9 % (ref 0.0–7.0)
Eosinophils Absolute: 0.1 10*3/uL (ref 0.0–0.5)
HCT: 37.8 % — ABNORMAL LOW (ref 38.4–49.9)
HEMOGLOBIN: 13.2 g/dL (ref 13.0–17.1)
LYMPH%: 50.7 % — AB (ref 14.0–49.0)
MCH: 34.5 pg — AB (ref 27.2–33.4)
MCHC: 34.9 g/dL (ref 32.0–36.0)
MCV: 98.7 fL — ABNORMAL HIGH (ref 79.3–98.0)
MONO#: 0.2 10*3/uL (ref 0.1–0.9)
MONO%: 7.7 % (ref 0.0–14.0)
NEUT#: 0.8 10*3/uL — ABNORMAL LOW (ref 1.5–6.5)
NEUT%: 38.2 % — AB (ref 39.0–75.0)
Platelets: 89 10*3/uL — ABNORMAL LOW (ref 140–400)
RBC: 3.83 10*6/uL — ABNORMAL LOW (ref 4.20–5.82)
RDW: 18.9 % — AB (ref 11.0–14.6)
WBC: 2.1 10*3/uL — ABNORMAL LOW (ref 4.0–10.3)
lymph#: 1.1 10*3/uL (ref 0.9–3.3)

## 2017-07-01 ENCOUNTER — Ambulatory Visit (INDEPENDENT_AMBULATORY_CARE_PROVIDER_SITE_OTHER): Payer: PPO | Admitting: Neurology

## 2017-07-01 DIAGNOSIS — E538 Deficiency of other specified B group vitamins: Secondary | ICD-10-CM

## 2017-07-01 MED ORDER — CYANOCOBALAMIN 1000 MCG/ML IJ SOLN
1000.0000 ug | Freq: Once | INTRAMUSCULAR | Status: AC
  Administered 2017-07-01: 1000 ug via INTRAMUSCULAR

## 2017-07-01 NOTE — Progress Notes (Signed)
B12 injection to left deltoid with no apparent complications.

## 2017-07-07 ENCOUNTER — Other Ambulatory Visit (HOSPITAL_BASED_OUTPATIENT_CLINIC_OR_DEPARTMENT_OTHER): Payer: PPO

## 2017-07-07 DIAGNOSIS — C450 Mesothelioma of pleura: Secondary | ICD-10-CM

## 2017-07-07 LAB — COMPREHENSIVE METABOLIC PANEL
ALT: 24 U/L (ref 0–55)
ANION GAP: 8 meq/L (ref 3–11)
AST: 35 U/L — AB (ref 5–34)
Albumin: 3.9 g/dL (ref 3.5–5.0)
Alkaline Phosphatase: 56 U/L (ref 40–150)
BUN: 14.5 mg/dL (ref 7.0–26.0)
CALCIUM: 9.2 mg/dL (ref 8.4–10.4)
CHLORIDE: 104 meq/L (ref 98–109)
CO2: 27 meq/L (ref 22–29)
CREATININE: 1 mg/dL (ref 0.7–1.3)
EGFR: 69 mL/min/{1.73_m2} — ABNORMAL LOW (ref >90)
GLUCOSE: 99 mg/dL (ref 70–140)
Potassium: 4.2 mEq/L (ref 3.5–5.1)
Sodium: 139 mEq/L (ref 136–145)
TOTAL PROTEIN: 6.6 g/dL (ref 6.4–8.3)
Total Bilirubin: 0.86 mg/dL (ref 0.20–1.20)

## 2017-07-07 LAB — CBC WITH DIFFERENTIAL/PLATELET
BASO%: 0.5 % (ref 0.0–2.0)
Basophils Absolute: 0 10*3/uL (ref 0.0–0.1)
EOS%: 1.4 % (ref 0.0–7.0)
Eosinophils Absolute: 0.1 10*3/uL (ref 0.0–0.5)
HEMATOCRIT: 38.1 % — AB (ref 38.4–49.9)
HEMOGLOBIN: 13 g/dL (ref 13.0–17.1)
LYMPH#: 1.7 10*3/uL (ref 0.9–3.3)
LYMPH%: 47.5 % (ref 14.0–49.0)
MCH: 34.8 pg — ABNORMAL HIGH (ref 27.2–33.4)
MCHC: 34.1 g/dL (ref 32.0–36.0)
MCV: 101.9 fL — ABNORMAL HIGH (ref 79.3–98.0)
MONO#: 0.5 10*3/uL (ref 0.1–0.9)
MONO%: 13.5 % (ref 0.0–14.0)
NEUT%: 37.1 % — AB (ref 39.0–75.0)
NEUTROS ABS: 1.4 10*3/uL — AB (ref 1.5–6.5)
PLATELETS: 68 10*3/uL — AB (ref 140–400)
RBC: 3.74 10*6/uL — ABNORMAL LOW (ref 4.20–5.82)
RDW: 20.1 % — AB (ref 11.0–14.6)
WBC: 3.6 10*3/uL — AB (ref 4.0–10.3)

## 2017-07-07 LAB — UA PROTEIN, DIPSTICK - CHCC: PROTEIN: NEGATIVE mg/dL

## 2017-07-14 ENCOUNTER — Emergency Department (HOSPITAL_COMMUNITY)
Admission: EM | Admit: 2017-07-14 | Discharge: 2017-07-14 | Disposition: A | Discharge status: Home / Self Care | Payer: PPO

## 2017-07-14 ENCOUNTER — Ambulatory Visit (HOSPITAL_BASED_OUTPATIENT_CLINIC_OR_DEPARTMENT_OTHER): Payer: PPO

## 2017-07-14 ENCOUNTER — Ambulatory Visit: Payer: PPO

## 2017-07-14 ENCOUNTER — Other Ambulatory Visit (HOSPITAL_BASED_OUTPATIENT_CLINIC_OR_DEPARTMENT_OTHER): Payer: PPO

## 2017-07-14 ENCOUNTER — Telehealth: Payer: Self-pay | Admitting: Internal Medicine

## 2017-07-14 ENCOUNTER — Ambulatory Visit (HOSPITAL_BASED_OUTPATIENT_CLINIC_OR_DEPARTMENT_OTHER): Payer: PPO | Admitting: Internal Medicine

## 2017-07-14 ENCOUNTER — Encounter: Payer: Self-pay | Admitting: Internal Medicine

## 2017-07-14 VITALS — BP 140/96 | HR 90 | Temp 98.0°F | Resp 18 | Ht 71.0 in | Wt 212.1 lb

## 2017-07-14 DIAGNOSIS — C450 Mesothelioma of pleura: Secondary | ICD-10-CM | POA: Diagnosis not present

## 2017-07-14 DIAGNOSIS — Z5111 Encounter for antineoplastic chemotherapy: Secondary | ICD-10-CM

## 2017-07-14 DIAGNOSIS — G3184 Mild cognitive impairment, so stated: Secondary | ICD-10-CM

## 2017-07-14 DIAGNOSIS — Z95828 Presence of other vascular implants and grafts: Secondary | ICD-10-CM

## 2017-07-14 LAB — COMPREHENSIVE METABOLIC PANEL
ALT: 18 U/L (ref 0–55)
AST: 26 U/L (ref 5–34)
Albumin: 3.8 g/dL (ref 3.5–5.0)
Alkaline Phosphatase: 52 U/L (ref 40–150)
Anion Gap: 11 mEq/L (ref 3–11)
BUN: 16.6 mg/dL (ref 7.0–26.0)
CHLORIDE: 106 meq/L (ref 98–109)
CO2: 22 mEq/L (ref 22–29)
Calcium: 9.8 mg/dL (ref 8.4–10.4)
Creatinine: 1 mg/dL (ref 0.7–1.3)
EGFR: 71 mL/min/{1.73_m2} — ABNORMAL LOW (ref >90)
GLUCOSE: 179 mg/dL — AB (ref 70–140)
POTASSIUM: 4.1 meq/L (ref 3.5–5.1)
SODIUM: 139 meq/L (ref 136–145)
Total Bilirubin: 0.91 mg/dL (ref 0.20–1.20)
Total Protein: 6.7 g/dL (ref 6.4–8.3)

## 2017-07-14 LAB — CBC WITH DIFFERENTIAL/PLATELET
BASO%: 0.2 % (ref 0.0–2.0)
BASOS ABS: 0 10*3/uL (ref 0.0–0.1)
EOS%: 0 % (ref 0.0–7.0)
Eosinophils Absolute: 0 10*3/uL (ref 0.0–0.5)
HCT: 36.9 % — ABNORMAL LOW (ref 38.4–49.9)
HGB: 12.8 g/dL — ABNORMAL LOW (ref 13.0–17.1)
LYMPH#: 1.1 10*3/uL (ref 0.9–3.3)
LYMPH%: 20.9 % (ref 14.0–49.0)
MCH: 35.2 pg — AB (ref 27.2–33.4)
MCHC: 34.7 g/dL (ref 32.0–36.0)
MCV: 101.4 fL — ABNORMAL HIGH (ref 79.3–98.0)
MONO#: 0.6 10*3/uL (ref 0.1–0.9)
MONO%: 12.2 % (ref 0.0–14.0)
NEUT#: 3.4 10*3/uL (ref 1.5–6.5)
NEUT%: 66.7 % (ref 39.0–75.0)
Platelets: 182 10*3/uL (ref 140–400)
RBC: 3.64 10*6/uL — AB (ref 4.20–5.82)
RDW: 20.4 % — AB (ref 11.0–14.6)
WBC: 5 10*3/uL (ref 4.0–10.3)

## 2017-07-14 MED ORDER — SODIUM CHLORIDE 0.9 % IV SOLN
513.0000 mg/m2 | Freq: Once | INTRAVENOUS | Status: AC
  Administered 2017-07-14: 1100 mg via INTRAVENOUS
  Filled 2017-07-14: qty 40

## 2017-07-14 MED ORDER — SODIUM CHLORIDE 0.9 % IV SOLN
Freq: Once | INTRAVENOUS | Status: AC
  Administered 2017-07-14: 13:00:00 via INTRAVENOUS

## 2017-07-14 MED ORDER — SODIUM CHLORIDE 0.9% FLUSH
10.0000 mL | INTRAVENOUS | Status: DC | PRN
  Administered 2017-07-14: 10 mL
  Filled 2017-07-14: qty 10

## 2017-07-14 MED ORDER — CARBOPLATIN CHEMO INJECTION 600 MG/60ML
499.5000 mg | Freq: Once | INTRAVENOUS | Status: AC
  Administered 2017-07-14: 500 mg via INTRAVENOUS
  Filled 2017-07-14: qty 50

## 2017-07-14 MED ORDER — HEPARIN SOD (PORK) LOCK FLUSH 100 UNIT/ML IV SOLN
500.0000 [IU] | Freq: Once | INTRAVENOUS | Status: AC | PRN
  Administered 2017-07-14: 500 [IU]
  Filled 2017-07-14: qty 5

## 2017-07-14 MED ORDER — SODIUM CHLORIDE 0.9% FLUSH
10.0000 mL | Freq: Once | INTRAVENOUS | Status: AC
  Administered 2017-07-14: 10 mL
  Filled 2017-07-14: qty 10

## 2017-07-14 MED ORDER — PALONOSETRON HCL INJECTION 0.25 MG/5ML
0.2500 mg | Freq: Once | INTRAVENOUS | Status: AC
  Administered 2017-07-14: 0.25 mg via INTRAVENOUS

## 2017-07-14 MED ORDER — PALONOSETRON HCL INJECTION 0.25 MG/5ML
INTRAVENOUS | Status: AC
  Filled 2017-07-14: qty 5

## 2017-07-14 MED ORDER — FOSAPREPITANT DIMEGLUMINE INJECTION 150 MG
Freq: Once | INTRAVENOUS | Status: AC
  Administered 2017-07-14: 13:00:00 via INTRAVENOUS
  Filled 2017-07-14: qty 5

## 2017-07-14 NOTE — ED Provider Notes (Signed)
Pt was sent from the cancer center to have an MRI.   He has a history of malignant melanoma. Dr Earlie Server had ordered an MRI stat but they were not able to do it until Monday so he was sent here.  Pt has been waiting to have that done.   Pt and his wife do not want to wait any longer.  According to staff, MRI is not available at [facility] now.  They left at 5pm.  Pt would have to be transferred.  They understand they can return at any time.  Pt appears to have capacity to make that decision.   Dorie Rank, MD 07/14/17 1728

## 2017-07-14 NOTE — Patient Instructions (Signed)
Implanted Port Home Guide An implanted port is a type of central line that is placed under the skin. Central lines are used to provide IV access when treatment or nutrition needs to be given through a person's veins. Implanted ports are used for long-term IV access. An implanted port may be placed because:  You need IV medicine that would be irritating to the small veins in your hands or arms.  You need long-term IV medicines, such as antibiotics.  You need IV nutrition for a long period.  You need frequent blood draws for lab tests.  You need dialysis.  Implanted ports are usually placed in the chest area, but they can also be placed in the upper arm, the abdomen, or the leg. An implanted port has two main parts:  Reservoir. The reservoir is round and will appear as a small, raised area under your skin. The reservoir is the part where a needle is inserted to give medicines or draw blood.  Catheter. The catheter is a thin, flexible tube that extends from the reservoir. The catheter is placed into a large vein. Medicine that is inserted into the reservoir goes into the catheter and then into the vein.  How will I care for my incision site? Do not get the incision site wet. Bathe or shower as directed by your health care provider. How is my port accessed? Special steps must be taken to access the port:  Before the port is accessed, a numbing cream can be placed on the skin. This helps numb the skin over the port site.  Your health care provider uses a sterile technique to access the port. ? Your health care provider must put on a mask and sterile gloves. ? The skin over your port is cleaned carefully with an antiseptic and allowed to dry. ? The port is gently pinched between sterile gloves, and a needle is inserted into the port.  Only "non-coring" port needles should be used to access the port. Once the port is accessed, a blood return should be checked. This helps ensure that the port  is in the vein and is not clogged.  If your port needs to remain accessed for a constant infusion, a clear (transparent) bandage will be placed over the needle site. The bandage and needle will need to be changed every week, or as directed by your health care provider.  Keep the bandage covering the needle clean and dry. Do not get it wet. Follow your health care provider's instructions on how to take a shower or bath while the port is accessed.  If your port does not need to stay accessed, no bandage is needed over the port.  What is flushing? Flushing helps keep the port from getting clogged. Follow your health care provider's instructions on how and when to flush the port. Ports are usually flushed with saline solution or a medicine called heparin. The need for flushing will depend on how the port is used.  If the port is used for intermittent medicines or blood draws, the port will need to be flushed: ? After medicines have been given. ? After blood has been drawn. ? As part of routine maintenance.  If a constant infusion is running, the port may not need to be flushed.  How long will my port stay implanted? The port can stay in for as long as your health care provider thinks it is needed. When it is time for the port to come out, surgery will be   done to remove it. The procedure is similar to the one performed when the port was put in. When should I seek immediate medical care? When you have an implanted port, you should seek immediate medical care if:  You notice a bad smell coming from the incision site.  You have swelling, redness, or drainage at the incision site.  You have more swelling or pain at the port site or the surrounding area.  You have a fever that is not controlled with medicine.  This information is not intended to replace advice given to you by your health care provider. Make sure you discuss any questions you have with your health care provider. Document  Released: 11/23/2005 Document Revised: 04/30/2016 Document Reviewed: 07/31/2013 Elsevier Interactive Patient Education  2017 Elsevier Inc.  

## 2017-07-14 NOTE — ED Notes (Signed)
Patient left AMA. Dr. Delene Ruffini notified. Patient was here for STAT MRI. Patient will follow up with the office to reschedule MRI

## 2017-07-14 NOTE — Telephone Encounter (Signed)
Scheduled appt per 8/8 los - Gave patient AVS and calender per los. 

## 2017-07-14 NOTE — Patient Instructions (Signed)
[facility] Discharge Instructions for Patients Receiving Chemotherapy  Today you received the following chemotherapy agents :  Alimta, Carboplatin.  To help prevent nausea and vomiting after your treatment, we encourage you to take your nausea medication as prescribed.   If you develop nausea and vomiting that is not controlled by your nausea medication, call the clinic.   BELOW ARE SYMPTOMS THAT SHOULD BE REPORTED IMMEDIATELY:  *FEVER GREATER THAN 100.5 F  *CHILLS WITH OR WITHOUT FEVER  NAUSEA AND VOMITING THAT IS NOT CONTROLLED WITH YOUR NAUSEA MEDICATION  *UNUSUAL SHORTNESS OF BREATH  *UNUSUAL BRUISING OR BLEEDING  TENDERNESS IN MOUTH AND THROAT WITH OR WITHOUT PRESENCE OF ULCERS  *URINARY PROBLEMS  *BOWEL PROBLEMS  UNUSUAL RASH Items with * indicate a potential emergency and should be followed up as soon as possible.  Feel free to call the clinic you have any questions or concerns. The clinic phone number is [phone].  Please show the [place] at check-in to the Emergency Department and triage nurse.

## 2017-07-14 NOTE — Progress Notes (Signed)
[facility] Telephone:([phone]   Fax:(336) [phone]  OFFICE PROGRESS NOTE  [name], MD [place] [address]  DIAGNOSIS: Stage II malignant pleural mesothelioma, epithelioid type involving the left hemithorax diagnosed in March 2018.  PRIOR THERAPY: Status post left VATS with biopsy and wedge resection of the left lower lobe as well as parietal pleurectomy under the care of Dr. Roxan Hockey on 02/15/2017.  CURRENT THERAPY: Palliative systemic chemotherapy with carboplatin for AUC of 5, Alimta 500 MG/M2 and Avastin 15 MG/KG every 3 weeks. Status post 5 cycles.  INTERVAL HISTORY: Dean Pruitt 83 y.o. male returns to the clinic today for follow-up visit accompanied by his wife. The patient has several episodes of confusion and altered mental status recently. He is very forgetful and has the wrong dates and names. He denied having any current chest pain, shortness of breath, cough or hemoptysis. He denied having any weight loss or night sweats. He has no nausea, vomiting, diarrhea or constipation. He denied having any fever or chills. He has no headache or visual changes. He does today for evaluation before starting cycle #6.   MEDICAL HISTORY: Past Medical History:  Diagnosis Date  . Cataract   . Depression    situational  . GERD (gastroesophageal reflux disease)   . Goals of care, counseling/discussion 03/20/2017  . Hyperlipidemia   . Hypothyroidism   . Reactive depression 03/20/2017  . Thyroid cancer ([facility])    PMH of; on supressive therapy    ALLERGIES:  is allergic to ativan [lorazepam]; tramadol; and fentanyl.  MEDICATIONS:  Current Outpatient Prescriptions  Medication Sig Dispense Refill  . aspirin 81 MG tablet Take 81 mg by mouth daily.    Marland Kitchen dexamethasone (DECADRON) 4 MG tablet 4 mg by mouth twice a day the day before, day of and day after the chemotherapy every 3 weeks. 40 tablet 1  . diphenhydrAMINE (BENADRYL) 25 MG tablet  Take 50 mg by mouth at bedtime.     Marland Kitchen escitalopram (LEXAPRO) 10 MG tablet TAKE ONE TABLET BY MOUTH ONCE DAILY AT BEDTIME 30 tablet 3  . folic acid (FOLVITE) 1 MG tablet Take 1 tablet (1 mg total) by mouth daily. 30 tablet 4  . gabapentin (NEURONTIN) 300 MG capsule TAKE ONE CAPSULE BY MOUTH AT NIGHT 30 capsule 3  . levothyroxine (SYNTHROID, LEVOTHROID) 150 MCG tablet TAKE 1 TABLET (150 mcg) BY MOUTH 3 days a week, 75 mcg 4 days a week 45 tablet 0  . lidocaine-prilocaine (EMLA) cream Apply 1 application topically as needed. (Patient taking differently: Apply 1 application topically as needed (port access). ) 30 g 0  . omeprazole (PRILOSEC) 20 MG capsule Take 20 mg by mouth every other day.    . oxyCODONE (OXY IR/ROXICODONE) 5 MG immediate release tablet Take 1 tablet (5 mg total) by mouth every 6 (six) hours as needed for severe pain. (Patient not taking: Reported on 06/23/2017) 30 tablet 0  . Probiotic Product (PROBIOTIC PO) Take 1 tablet by mouth 2 (two) times daily.     . prochlorperazine (COMPAZINE) 10 MG tablet Take 1 tablet (10 mg total) by mouth every 6 (six) hours as needed for nausea or vomiting. (Patient not taking: Reported on 06/23/2017) 30 tablet 0  . Pseudoeph-Doxylamine-DM-APAP (NYQUIL PO) Take by mouth.    . rosuvastatin (CRESTOR) 20 MG tablet Take 1 tablet (20 mg total) by mouth daily. (Patient taking differently: Take 20 mg by mouth every other day. )  90 tablet 3   No current facility-administered medications for this visit.     SURGICAL HISTORY:  Past Surgical History:  Procedure Laterality Date  . CATARACT EXTRACTION, BILATERAL  2013 & 2014   Dr Celene Squibb  . colon polyps  2002 & 2005   negative 2008; Dr Earlean Shawl  . COLONOSCOPY W/ POLYPECTOMY    . LUNG BIOPSY Left 02/15/2017   Procedure: LUNG BIOPSY;  Surgeon: Melrose Nakayama, MD;  Location: [facility];  Service: Thoracic;  Laterality: Left;  . PLEURAL EFFUSION DRAINAGE Left 02/15/2017   Procedure: DRAINAGE OF PLEURAL EFFUSION;   Surgeon: Melrose Nakayama, MD;  Location: [facility];  Service: Thoracic;  Laterality: Left;  . PORTACATH PLACEMENT N/A 03/25/2017   Procedure: INSERTION PORT-A-CATH, right internal jugular;  Surgeon: Melrose Nakayama, MD;  Location: [facility];  Service: Thoracic;  Laterality: N/A;  . THYROIDECTOMY  2001   S/P RAI  . VIDEO ASSISTED THORACOSCOPY Left 02/15/2017   Procedure: VIDEO ASSISTED THORACOSCOPY;  Surgeon: Melrose Nakayama, MD;  Location: [facility];  Service: Thoracic;  Laterality: Left;    REVIEW OF SYSTEMS:  Constitutional: positive for fatigue Eyes: negative Ears, nose, mouth, throat, and face: negative Respiratory: negative Cardiovascular: negative Gastrointestinal: negative Genitourinary:negative Integument/breast: negative Hematologic/lymphatic: negative Musculoskeletal:negative Neurological: positive for Confusion Behavioral/Psych: negative Endocrine: negative Allergic/Immunologic: negative   PHYSICAL EXAMINATION: General appearance: alert, cooperative, fatigued and no distress Head: Normocephalic, without obvious abnormality, atraumatic Neck: no adenopathy, no JVD, supple, symmetrical, trachea midline and thyroid not enlarged, symmetric, no tenderness/mass/nodules Lymph nodes: Cervical, supraclavicular, and axillary nodes normal. Resp: clear to auscultation bilaterally Back: symmetric, no curvature. ROM normal. No CVA tenderness. Cardio: regular rate and rhythm, S1, S2 normal, no murmur, click, rub or gallop GI: soft, non-tender; bowel sounds normal; no masses,  no organomegaly Extremities: extremities normal, atraumatic, no cyanosis or edema Neurologic: Mental status: Alert, oriented, thought content appropriate, Confusion and hard time finding words and correct dates  ECOG PERFORMANCE STATUS: 1 - Symptomatic but completely ambulatory  Blood pressure (!) 140/96, pulse 90, temperature 98 F (36.7 C), temperature source Oral, resp. rate 18, height 5\' 11"  (1.803 m),  weight 212 lb 1.6 oz (96.2 kg), SpO2 95 %.  LABORATORY DATA: Lab Results  Component Value Date   WBC 5.0 07/14/2017   HGB 12.8 (L) 07/14/2017   HCT 36.9 (L) 07/14/2017   MCV 101.4 (H) 07/14/2017   PLT 182 07/14/2017      Chemistry      Component Value Date/Time   NA 139 07/07/2017 1115   K 4.2 07/07/2017 1115   CL 103 03/25/2017 0618   CO2 27 07/07/2017 1115   BUN 14.5 07/07/2017 1115   CREATININE 1.0 07/07/2017 1115      Component Value Date/Time   CALCIUM 9.2 07/07/2017 1115   ALKPHOS 56 07/07/2017 1115   AST 35 (H) 07/07/2017 1115   ALT 24 07/07/2017 1115   BILITOT 0.86 07/07/2017 1115       RADIOGRAPHIC STUDIES: No results found.  ASSESSMENT AND PLAN:  This is a very pleasant 83 years old white male with malignant pleural mesothelioma involving the left hemothorax. He is currently undergoing treatment with systemic chemotherapy with carboplatin, Alimta and Avastin status post 5 cycles. He has been tolerating the treatment fairly well with no significant adverse effects. He came today with significant confusion and mental status change. I recommended for the patient to have MRI of the brain performed as soon as possible to rule out any stroke or  other abnormalities. He will continue his current treatment as a scheduled and the patient will proceed with cycle #6 today. I would hold his dose of Avastin today because of the concern about the neurological abnormalities seen recently. I will see him back for follow-up visit in 3 weeks for evaluation after repeating CT scan of the chest, abdomen and pelvis for restaging of his disease. He was advised to call immediately if he has any concerning symptoms in the interval. The patient voices understanding of current disease status and treatment options and is in agreement with the current care plan. All questions were answered. The patient knows to call the clinic with any problems, questions or concerns. We can certainly see  the patient much sooner if necessary.  Disclaimer: This note was dictated with voice recognition software. Similar sounding words can inadvertently be transcribed and may not be corrected upon review.

## 2017-07-19 ENCOUNTER — Ambulatory Visit (HOSPITAL_COMMUNITY)
Admission: RE | Admit: 2017-07-19 | Discharge: 2017-07-19 | Disposition: A | Discharge status: Home / Self Care | Payer: PPO | Source: Ambulatory Visit | Attending: Internal Medicine | Admitting: Internal Medicine

## 2017-07-19 ENCOUNTER — Telehealth: Payer: Self-pay

## 2017-07-19 DIAGNOSIS — C450 Mesothelioma of pleura: Secondary | ICD-10-CM | POA: Diagnosis not present

## 2017-07-19 DIAGNOSIS — Z9221 Personal history of antineoplastic chemotherapy: Secondary | ICD-10-CM | POA: Insufficient documentation

## 2017-07-19 DIAGNOSIS — R4182 Altered mental status, unspecified: Secondary | ICD-10-CM | POA: Diagnosis not present

## 2017-07-19 DIAGNOSIS — Z5111 Encounter for antineoplastic chemotherapy: Secondary | ICD-10-CM

## 2017-07-19 DIAGNOSIS — G3184 Mild cognitive impairment, so stated: Secondary | ICD-10-CM

## 2017-07-19 MED ORDER — GADOBENATE DIMEGLUMINE 529 MG/ML IV SOLN
20.0000 mL | Freq: Once | INTRAVENOUS | Status: AC | PRN
  Administered 2017-07-19: 19 mL via INTRAVENOUS

## 2017-07-19 NOTE — Telephone Encounter (Signed)
[facility] radiology call report on MRI brain b/c it was ordered stat. Impression is no mets.

## 2017-07-20 DIAGNOSIS — N3941 Urge incontinence: Secondary | ICD-10-CM | POA: Diagnosis not present

## 2017-07-24 ENCOUNTER — Other Ambulatory Visit: Payer: Self-pay | Admitting: Internal Medicine

## 2017-08-02 ENCOUNTER — Encounter (HOSPITAL_COMMUNITY): Payer: Self-pay

## 2017-08-02 ENCOUNTER — Ambulatory Visit (HOSPITAL_COMMUNITY)
Admission: RE | Admit: 2017-08-02 | Discharge: 2017-08-02 | Disposition: A | Discharge status: Home / Self Care | Payer: PPO | Source: Ambulatory Visit | Attending: Internal Medicine | Admitting: Internal Medicine

## 2017-08-02 DIAGNOSIS — Z9889 Other specified postprocedural states: Secondary | ICD-10-CM | POA: Insufficient documentation

## 2017-08-02 DIAGNOSIS — K5730 Diverticulosis of large intestine without perforation or abscess without bleeding: Secondary | ICD-10-CM | POA: Diagnosis not present

## 2017-08-02 DIAGNOSIS — K449 Diaphragmatic hernia without obstruction or gangrene: Secondary | ICD-10-CM | POA: Diagnosis not present

## 2017-08-02 DIAGNOSIS — G3184 Mild cognitive impairment, so stated: Secondary | ICD-10-CM

## 2017-08-02 DIAGNOSIS — I2510 Atherosclerotic heart disease of native coronary artery without angina pectoris: Secondary | ICD-10-CM | POA: Insufficient documentation

## 2017-08-02 DIAGNOSIS — Z5111 Encounter for antineoplastic chemotherapy: Secondary | ICD-10-CM

## 2017-08-02 DIAGNOSIS — C450 Mesothelioma of pleura: Secondary | ICD-10-CM | POA: Diagnosis not present

## 2017-08-02 DIAGNOSIS — C459 Mesothelioma, unspecified: Secondary | ICD-10-CM | POA: Diagnosis not present

## 2017-08-02 DIAGNOSIS — K4020 Bilateral inguinal hernia, without obstruction or gangrene, not specified as recurrent: Secondary | ICD-10-CM | POA: Diagnosis not present

## 2017-08-02 MED ORDER — HEPARIN SOD (PORK) LOCK FLUSH 100 UNIT/ML IV SOLN
INTRAVENOUS | Status: AC
  Filled 2017-08-02: qty 5

## 2017-08-02 MED ORDER — IOPAMIDOL (ISOVUE-300) INJECTION 61%
INTRAVENOUS | Status: AC
  Filled 2017-08-02: qty 100

## 2017-08-02 MED ORDER — HEPARIN SOD (PORK) LOCK FLUSH 100 UNIT/ML IV SOLN
500.0000 [IU] | Freq: Once | INTRAVENOUS | Status: AC
  Administered 2017-08-02: 500 [IU] via INTRAVENOUS

## 2017-08-02 MED ORDER — IOPAMIDOL (ISOVUE-300) INJECTION 61%
100.0000 mL | Freq: Once | INTRAVENOUS | Status: AC | PRN
  Administered 2017-08-02: 100 mL via INTRAVENOUS

## 2017-08-03 ENCOUNTER — Encounter: Payer: PPO | Admitting: Internal Medicine

## 2017-08-04 ENCOUNTER — Telehealth: Payer: Self-pay | Admitting: Internal Medicine

## 2017-08-04 ENCOUNTER — Encounter: Payer: Self-pay | Admitting: Internal Medicine

## 2017-08-04 ENCOUNTER — Ambulatory Visit (HOSPITAL_BASED_OUTPATIENT_CLINIC_OR_DEPARTMENT_OTHER): Payer: PPO | Admitting: Internal Medicine

## 2017-08-04 ENCOUNTER — Ambulatory Visit (INDEPENDENT_AMBULATORY_CARE_PROVIDER_SITE_OTHER): Payer: PPO

## 2017-08-04 VITALS — BP 146/93 | HR 74 | Temp 98.4°F | Resp 18 | Wt 213.5 lb

## 2017-08-04 DIAGNOSIS — C450 Mesothelioma of pleura: Secondary | ICD-10-CM

## 2017-08-04 DIAGNOSIS — E538 Deficiency of other specified B group vitamins: Secondary | ICD-10-CM

## 2017-08-04 DIAGNOSIS — F329 Major depressive disorder, single episode, unspecified: Secondary | ICD-10-CM | POA: Diagnosis not present

## 2017-08-04 DIAGNOSIS — E039 Hypothyroidism, unspecified: Secondary | ICD-10-CM

## 2017-08-04 DIAGNOSIS — Z5111 Encounter for antineoplastic chemotherapy: Secondary | ICD-10-CM

## 2017-08-04 MED ORDER — CYANOCOBALAMIN 1000 MCG/ML IJ SOLN
1000.0000 ug | Freq: Once | INTRAMUSCULAR | Status: AC
  Administered 2017-08-04: 1000 ug via INTRAMUSCULAR

## 2017-08-04 NOTE — Telephone Encounter (Signed)
Gave patients wife AVS and calendar of upcoming November appointments.

## 2017-08-04 NOTE — Progress Notes (Signed)
[facility] [phone]   [phone]  OFFICE PROGRESS NOTE  [name], MD [place] [address]  DIAGNOSIS: Stage II malignant pleural mesothelioma, epithelioid type involving the left hemithorax diagnosed in March 2018.  PRIOR THERAPY:  1) Status post left VATS with biopsy and wedge resection of the left lower lobe as well as parietal pleurectomy under the care of Dr. Roxan Hockey on 02/15/2017. 2) Palliative systemic chemotherapy with carboplatin for AUC of 5, Alimta 500 MG/M2 and Avastin 15 MG/KG every 3 weeks. Status post 6 cycles.  CURRENT THERAPY: Observation.  INTERVAL HISTORY: Kanyon Seibold [place] 83 y.o. male returns to the clinic today for follow-up visit accompanied by his wife. The patient is feeling fine today with no specific complaints except for mild fatigue. He tolerated the last cycle of his treatment with carboplatin, Alimta and Avastin fairly well. He denied having any nausea, vomiting, diarrhea or constipation. He denied having any fever or chills. He continues to have intermittent left-sided chest pain but no significant shortness of breath, cough or hemoptysis. The patient had repeat CT scan of the chest, abdomen and pelvis performed recently and he is here for evaluation and discussion of his scan results.  MEDICAL HISTORY: Past Medical History:  Diagnosis Date  . Cataract   . Depression    situational  . GERD (gastroesophageal reflux disease)   . Goals of care, counseling/discussion 03/20/2017  . Hyperlipidemia   . Hypothyroidism   . Reactive depression 03/20/2017  . Thyroid cancer ([place])    PMH of; on supressive therapy    ALLERGIES:  is allergic to ativan [lorazepam]; tramadol; and fentanyl.  MEDICATIONS:  Current Outpatient Prescriptions  Medication Sig Dispense Refill  . aspirin 81 MG tablet Take 81 mg by mouth daily.    Marland Kitchen dexamethasone (DECADRON) 4 MG tablet 4 mg by mouth twice a day the day  before, day of and day after the chemotherapy every 3 weeks. 40 tablet 1  . diphenhydrAMINE (BENADRYL) 25 MG tablet Take 50 mg by mouth at bedtime.     Marland Kitchen escitalopram (LEXAPRO) 10 MG tablet TAKE ONE TABLET BY MOUTH ONCE DAILY AT BEDTIME 30 tablet 3  . folic acid (FOLVITE) 1 MG tablet Take 1 tablet (1 mg total) by mouth daily. 30 tablet 4  . gabapentin (NEURONTIN) 300 MG capsule TAKE ONE CAPSULE BY MOUTH AT NIGHT 30 capsule 3  . levothyroxine (SYNTHROID, LEVOTHROID) 150 MCG tablet TAKE 1 TABLET (150 mcg) BY MOUTH 3 days a week, 75 mcg 4 days a week 45 tablet 0  . lidocaine-prilocaine (EMLA) cream Apply 1 application topically as needed. (Patient taking differently: Apply 1 application topically as needed (port access). ) 30 g 0  . omeprazole (PRILOSEC) 20 MG capsule Take 20 mg by mouth every other day.    . Probiotic Product (PROBIOTIC PO) Take 1 tablet by mouth 2 (two) times daily.     . Pseudoeph-Doxylamine-DM-APAP (NYQUIL PO) Take by mouth.    . rosuvastatin (CRESTOR) 20 MG tablet Take 1 tablet (20 mg total) by mouth daily. (Patient taking differently: Take 20 mg by mouth every other day. ) 90 tablet 3  . oxyCODONE (OXY IR/ROXICODONE) 5 MG immediate release tablet Take 1 tablet (5 mg total) by mouth every 6 (six) hours as needed for severe pain. (Patient not taking: Reported on 08/04/2017) 30 tablet 0  . prochlorperazine (COMPAZINE) 10 MG tablet Take 1 tablet (10 mg total) by mouth every  6 (six) hours as needed for nausea or vomiting. (Patient not taking: Reported on 08/04/2017) 30 tablet 0   No current facility-administered medications for this visit.     SURGICAL HISTORY:  Past Surgical History:  Procedure Laterality Date  . CATARACT EXTRACTION, BILATERAL  2013 & 2014   Dr Celene Squibb  . colon polyps  2002 & 2005   negative 2008; Dr Earlean Shawl  . COLONOSCOPY W/ POLYPECTOMY    . LUNG BIOPSY Left 02/15/2017   Procedure: LUNG BIOPSY;  Surgeon: Melrose Nakayama, MD;  Location: [facility];  Service:  Thoracic;  Laterality: Left;  . PLEURAL EFFUSION DRAINAGE Left 02/15/2017   Procedure: DRAINAGE OF PLEURAL EFFUSION;  Surgeon: Melrose Nakayama, MD;  Location: [facility];  Service: Thoracic;  Laterality: Left;  . PORTACATH PLACEMENT N/A 03/25/2017   Procedure: INSERTION PORT-A-CATH, right internal jugular;  Surgeon: Melrose Nakayama, MD;  Location: [facility];  Service: Thoracic;  Laterality: N/A;  . THYROIDECTOMY  2001   S/P RAI  . VIDEO ASSISTED THORACOSCOPY Left 02/15/2017   Procedure: VIDEO ASSISTED THORACOSCOPY;  Surgeon: Melrose Nakayama, MD;  Location: [facility];  Service: Thoracic;  Laterality: Left;    REVIEW OF SYSTEMS:  Constitutional: positive for fatigue Eyes: negative Ears, nose, mouth, throat, and face: negative Respiratory: positive for pleurisy/chest pain Cardiovascular: negative Gastrointestinal: negative Genitourinary:negative Integument/breast: negative Hematologic/lymphatic: negative Musculoskeletal:negative Neurological: negative Behavioral/Psych: negative Endocrine: negative Allergic/Immunologic: negative   PHYSICAL EXAMINATION: General appearance: alert, cooperative, fatigued and no distress Head: Normocephalic, without obvious abnormality, atraumatic Neck: no adenopathy, no JVD, supple, symmetrical, trachea midline and thyroid not enlarged, symmetric, no tenderness/mass/nodules Lymph nodes: Cervical, supraclavicular, and axillary nodes normal. Resp: clear to auscultation bilaterally Back: symmetric, no curvature. ROM normal. No CVA tenderness. Cardio: regular rate and rhythm, S1, S2 normal, no murmur, click, rub or gallop GI: soft, non-tender; bowel sounds normal; no masses,  no organomegaly Extremities: extremities normal, atraumatic, no cyanosis or edema Neurologic: Mental status: Alert, oriented, thought content appropriate  ECOG PERFORMANCE STATUS: 1 - Symptomatic but completely ambulatory  Blood pressure (!) 146/93, pulse 74, temperature 98.4 F (36.9  C), temperature source Oral, resp. rate 18, weight 213 lb 8 oz (96.8 kg), SpO2 97 %.  LABORATORY DATA: Lab Results  Component Value Date   WBC 5.0 07/14/2017   HGB 12.8 (L) 07/14/2017   HCT 36.9 (L) 07/14/2017   MCV 101.4 (H) 07/14/2017   PLT 182 07/14/2017      Chemistry      Component Value Date/Time   NA 139 07/14/2017 1046   K 4.1 07/14/2017 1046   CL 103 03/25/2017 0618   CO2 22 07/14/2017 1046   BUN 16.6 07/14/2017 1046   CREATININE 1.0 07/14/2017 1046      Component Value Date/Time   CALCIUM 9.8 07/14/2017 1046   ALKPHOS 52 07/14/2017 1046   AST 26 07/14/2017 1046   ALT 18 07/14/2017 1046   BILITOT 0.91 07/14/2017 1046       RADIOGRAPHIC STUDIES: Ct Chest W Contrast  Result Date: 08/02/2017 CLINICAL DATA:  Restaging mesothelioma. EXAM: CT CHEST, ABDOMEN, AND PELVIS WITH CONTRAST TECHNIQUE: Multidetector CT imaging of the chest, abdomen and pelvis was performed following the standard protocol during bolus administration of intravenous contrast. CONTRAST:  143mL ISOVUE-300 IOPAMIDOL (ISOVUE-300) INJECTION 61% COMPARISON:  PET-CT 05/31/2017. FINDINGS: CT CHEST FINDINGS Cardiovascular: The heart is within normal limits in size and stable. There is tortuosity, ectasia and calcification of the thoracic aorta. Stable three-vessel coronary artery calcifications. Mediastinum/Nodes: Small  scattered mediastinal and hilar lymph nodes are stable. The esophagus is normal. Small hiatal hernia. Lungs/Pleura: No acute pulmonary findings. No worrisome pulmonary lesions. Stable postoperative changes involving the left hemithorax with Stable mild thickening and calcification along the left major fissure. No worrisome nodularity to suggest recurrent disease. Stable 5 mm pleural nodule medially on image number 37. Stable 3 mm nodule at the left lung base on image number 133. Chest wall/ Musculoskeletal: No chest wall mass, supraclavicular or axillary lymphadenopathy. The thyroid gland is  surgically absent. A right-sided Port-A-Cath is stable. New line no significant bony findings. CT ABDOMEN PELVIS FINDINGS Hepatobiliary: No focal hepatic lesions or intrahepatic biliary dilatation. The gallbladder is normal. No common bile duct dilatation. Pancreas: No mass, inflammation or ductal dilatation. Spleen: Normal size.  No focal lesions. Adrenals/Urinary Tract: The adrenal glands and kidneys are unremarkable and stable. Small lower pole left renal calculus noted. No ureteral or bladder calculi. No bladder mass. Stomach/Bowel: The stomach, duodenum, small bowel and colon are unremarkable. No acute inflammatory process, mass lesions or obstructive findings. The terminal ileum is normal. The appendix is normal. Scattered colonic diverticulosis without findings for acute diverticulitis. Vascular/Lymphatic: Stable tortuosity, ectasia and atherosclerotic calcifications involving the abdominal aorta. Mild fusiform aneurysmal dilatation with maximum measurements of 3.2 x 2.9 cm at the level of the IMA. No mesenteric or retroperitoneal mass or adenopathy. Reproductive: Mild prostate gland enlargement impressing on the base of the bladder. The seminal vesicles are normal. Other: Stable bilateral inguinal hernias containing fat. No inguinal mass or adenopathy. Musculoskeletal: No significant bony findings. IMPRESSION: 1. Stable CT appearance of the chest. Stable postoperative changes. No findings suspicious for recurrent tumor or metastatic disease. 2. No significant findings in the abdomen/pelvis other than incidental findings as described above. 3. Stable wall advanced atherosclerotic calcifications involving the thoracic and abdominal aorta and branch vessels including the coronary arteries. Electronically Signed   By: Marijo Sanes M.D.   On: 08/02/2017 13:51   Mr Jeri Cos MW Contrast  Result Date: 07/19/2017 CLINICAL DATA:  Worsened mental status. History of malignant chest tumor. EXAM: MRI HEAD WITHOUT AND  WITH CONTRAST TECHNIQUE: Multiplanar, multiecho pulse sequences of the brain and surrounding structures were obtained without and with intravenous contrast. CONTRAST:  63mL MULTIHANCE GADOBENATE DIMEGLUMINE 529 MG/ML IV SOLN COMPARISON:  02/26/2016 FINDINGS: Brain: Diffusion imaging does not show any acute or subacute infarction. The brain shows generalized atrophy. There are moderate chronic small-vessel ischemic changes affecting the brain, most prominent in the hemispheric deep white matter. No cortical or large vessel territory infarction. No mass lesion, hemorrhage, hydrocephalus or extra-axial collection. No abnormal enhancement occurs. Vascular: Major vessels at the base of the brain show flow. Skull and upper cervical spine: Negative Sinuses/Orbits: Clear/ normal. Other: None significant IMPRESSION: No acute or reversible finding. Moderate chronic small-vessel ischemic changes affecting the brain. No evidence of metastatic disease. These results will be called to the ordering clinician or representative by the Radiologist Assistant, and communication documented in the PACS or zVision Dashboard. Electronically Signed   By: Nelson Chimes M.D.   On: 07/19/2017 07:45   Ct Abdomen Pelvis W Contrast  Result Date: 08/02/2017 CLINICAL DATA:  Restaging mesothelioma. EXAM: CT CHEST, ABDOMEN, AND PELVIS WITH CONTRAST TECHNIQUE: Multidetector CT imaging of the chest, abdomen and pelvis was performed following the standard protocol during bolus administration of intravenous contrast. CONTRAST:  120mL ISOVUE-300 IOPAMIDOL (ISOVUE-300) INJECTION 61% COMPARISON:  PET-CT 05/31/2017. FINDINGS: CT CHEST FINDINGS Cardiovascular: The heart is within normal limits  in size and stable. There is tortuosity, ectasia and calcification of the thoracic aorta. Stable three-vessel coronary artery calcifications. Mediastinum/Nodes: Small scattered mediastinal and hilar lymph nodes are stable. The esophagus is normal. Small hiatal  hernia. Lungs/Pleura: No acute pulmonary findings. No worrisome pulmonary lesions. Stable postoperative changes involving the left hemithorax with Stable mild thickening and calcification along the left major fissure. No worrisome nodularity to suggest recurrent disease. Stable 5 mm pleural nodule medially on image number 37. Stable 3 mm nodule at the left lung base on image number 133. Chest wall/ Musculoskeletal: No chest wall mass, supraclavicular or axillary lymphadenopathy. The thyroid gland is surgically absent. A right-sided Port-A-Cath is stable. New line no significant bony findings. CT ABDOMEN PELVIS FINDINGS Hepatobiliary: No focal hepatic lesions or intrahepatic biliary dilatation. The gallbladder is normal. No common bile duct dilatation. Pancreas: No mass, inflammation or ductal dilatation. Spleen: Normal size.  No focal lesions. Adrenals/Urinary Tract: The adrenal glands and kidneys are unremarkable and stable. Small lower pole left renal calculus noted. No ureteral or bladder calculi. No bladder mass. Stomach/Bowel: The stomach, duodenum, small bowel and colon are unremarkable. No acute inflammatory process, mass lesions or obstructive findings. The terminal ileum is normal. The appendix is normal. Scattered colonic diverticulosis without findings for acute diverticulitis. Vascular/Lymphatic: Stable tortuosity, ectasia and atherosclerotic calcifications involving the abdominal aorta. Mild fusiform aneurysmal dilatation with maximum measurements of 3.2 x 2.9 cm at the level of the IMA. No mesenteric or retroperitoneal mass or adenopathy. Reproductive: Mild prostate gland enlargement impressing on the base of the bladder. The seminal vesicles are normal. Other: Stable bilateral inguinal hernias containing fat. No inguinal mass or adenopathy. Musculoskeletal: No significant bony findings. IMPRESSION: 1. Stable CT appearance of the chest. Stable postoperative changes. No findings suspicious for recurrent  tumor or metastatic disease. 2. No significant findings in the abdomen/pelvis other than incidental findings as described above. 3. Stable wall advanced atherosclerotic calcifications involving the thoracic and abdominal aorta and branch vessels including the coronary arteries. Electronically Signed   By: Marijo Sanes M.D.   On: 08/02/2017 13:51    ASSESSMENT AND PLAN:  This is a very pleasant 83 years old white male with malignant pleural mesothelioma involving the left hemothorax. He completed treatment with systemic chemotherapy with carboplatin, Alimta and Avastin status post 6 cycles. He has been tolerating the treatment fairly well with no significant adverse effects except for fatigue. His most recent MRI of the brain was unremarkable for any metastatic disease to the brain or stroke. The patient also had repeat CT scan of the chest, abdomen and pelvis performed yesterday. I personally and independent reviewed the scans and discuss the results with the patient his wife. His scan showed no findings for recurrent tumor or metastatic disease. I recommended for the patient to continue on observation with repeat CT scan of the chest, abdomen and pelvis in 3 months for restaging of his disease. For the history of depression he will continue on Lexapro and for the hypothyroidism the patient is currently on levothyroxine. He was advised to call immediately if he has any concerning symptoms in the interval. The patient voices understanding of current disease status and treatment options and is in agreement with the current care plan. All questions were answered. The patient knows to call the clinic with any problems, questions or concerns. We can certainly see the patient much sooner if necessary.  Disclaimer: This note was dictated with voice recognition software. Similar sounding words can inadvertently be transcribed  and may not be corrected upon review.

## 2017-08-11 ENCOUNTER — Other Ambulatory Visit: Payer: Self-pay | Admitting: Internal Medicine

## 2017-08-24 ENCOUNTER — Other Ambulatory Visit: Payer: Self-pay | Admitting: Internal Medicine

## 2017-08-25 ENCOUNTER — Telehealth: Payer: Self-pay

## 2017-08-25 ENCOUNTER — Other Ambulatory Visit: Payer: Self-pay | Admitting: Internal Medicine

## 2017-08-25 MED ORDER — FOLIC ACID 1 MG PO TABS: 1.0000 mg | ORAL_TABLET | Freq: Every day | ORAL | 4 refills | Status: DC

## 2017-08-25 NOTE — Telephone Encounter (Signed)
Daughter called for folic acid refill. She mentioned CT scan due November 29. Requested she or pt call back on November 1 to inquire about the CT scheduling. Explained the 30 day window for prior auth. Folic acid refilled.

## 2017-09-20 ENCOUNTER — Other Ambulatory Visit: Payer: Self-pay | Admitting: Internal Medicine

## 2017-09-23 ENCOUNTER — Telehealth: Payer: Self-pay | Admitting: Medical Oncology

## 2017-09-23 NOTE — Telephone Encounter (Signed)
Called about wrong parent.

## 2017-10-04 ENCOUNTER — Telehealth: Payer: Self-pay | Admitting: Medical Oncology

## 2017-10-04 ENCOUNTER — Telehealth: Payer: Self-pay | Admitting: Internal Medicine

## 2017-10-04 NOTE — Telephone Encounter (Signed)
Dean Pruitt's lawyer needs letter from MD re pts ability to sign over legal documents. I told wife to contact Dr Azalee Course.

## 2017-10-04 NOTE — Telephone Encounter (Signed)
Spouse called stating that herself and patient are currently going through chemo.  States that they are in the process of signing "things" over to patients brother.  States attorney's office is requesting letter from Dr. Quay Burow stating that patient is capable of signing legal documents.  States that letter can be sent to patients my chart.

## 2017-10-04 NOTE — Telephone Encounter (Signed)
Able to sign.  Can you write letter?

## 2017-10-05 ENCOUNTER — Encounter: Payer: Self-pay | Admitting: Emergency Medicine

## 2017-10-05 NOTE — Telephone Encounter (Signed)
Spoke with pts wife, she is going to have Daughter pick up the letter on Friday.

## 2017-10-12 ENCOUNTER — Telehealth: Payer: Self-pay | Admitting: Internal Medicine

## 2017-10-12 DIAGNOSIS — K921 Melena: Secondary | ICD-10-CM | POA: Insufficient documentation

## 2017-10-12 NOTE — Progress Notes (Signed)
Subjective:    Patient ID: Dean Pruitt, male    DOB: 1934/10/03, 83 y.o.   MRN: [id number]  HPI He is here for an acute visit.  He has memory issues and his daughter supplements his history   Change in stools:  He has been having urgency with a bowel movmeent and his stools are soft.  He does not always make it to the bathroom.  It started a couple of weeks ago.  There have been no changes in diet, medications or supplements.  His daughter has noticed that his stools are foul smelling.  She states the stool is a dark brown.  They both deny black stools or blood in the stool.  He has occasional GERD.  He denies nausea, diarrhea, abdominal pain and bloating.    He has been taking aleve - 2 tabs daily for pain.  He started this about two weeks ago.    Flexible sigmoidoscopy in 1998 was normal except for minimal sigmoid diverticulosis.    Medications and allergies reviewed with patient and updated if appropriate.  Patient Active Problem List   Diagnosis Date Noted  . Hypothyroidism 10/13/2017  . B12 deficiency 10/13/2017  . Change in stool 10/13/2017  . Port catheter in place 06/02/2017  . Degenerative arthritis of left knee 03/24/2017  . Goals of care, counseling/discussion 03/20/2017  . Encounter for antineoplastic chemotherapy 03/20/2017  . Reactive depression 03/20/2017  . Malignant mesothelioma of pleura ([facility]) 02/19/2017  . History of lung biopsy 02/17/2017  . Pleural effusion 02/15/2017  . Greater trochanteric bursitis of left hip 12/31/2016  . Pleural effusion, left 10/27/2016  . infrarenal abdominal aortic aneurysm without rupture ([place]) - [place] needed 2020 10/22/2016  . Left lumbar radiculopathy 10/12/2016  . Bursitis of left hip 09/30/2016  . Left foot drop 09/30/2016  . Toe pain, left 08/13/2016  . Myalgia 07/04/2016  . Mild cognitive impairment 07/03/2016  . Adjustment disorder with depressed mood 07/03/2016  . Frequency of urination 09/26/2014  . Macular  degeneration 06/22/2011  . Fasting hyperglycemia 05/20/2009  . NONSPECIFIC ABNORMAL ELECTROCARDIOGRAM 05/20/2009  . THYROID CANCER, HX OF 05/20/2009  . History of colonic polyps 05/20/2009  . Hyperlipidemia 11/21/2007    Current Outpatient Medications on File Prior to Visit  Medication Sig Dispense Refill  . aspirin 81 MG tablet Take 81 mg by mouth daily.    Marland Kitchen escitalopram (LEXAPRO) 10 MG tablet TAKE ONE TABLET BY MOUTH ONCE DAILY AT BEDTIME 30 tablet 3  . gabapentin (NEURONTIN) 300 MG capsule TAKE ONE CAPSULE BY MOUTH AT NIGHT 30 capsule 3  . levothyroxine (SYNTHROID, LEVOTHROID) 150 MCG tablet Take 1 tablet by mouth once daily 3 days a week and 1/2 tablet 4 days a week ---Office visit needed for further refills 20 tablet 0  . lidocaine-prilocaine (EMLA) cream Apply 1 application topically as needed. (Patient taking differently: Apply 1 application topically as needed (port access). ) 30 g 0  . omeprazole (PRILOSEC) 20 MG capsule Take 1 capsule (20 mg total) by mouth daily. -- Office visit needed for further refills 30 capsule 3  . Probiotic Product (PROBIOTIC PO) Take 1 tablet by mouth 2 (two) times daily.     . rosuvastatin (CRESTOR) 20 MG tablet Take 1 tablet (20 mg total) by mouth daily. (Patient taking differently: Take 20 mg by mouth every other day. ) 90 tablet 3   No current facility-administered medications on file prior to visit.     Past Medical History:  Diagnosis  Date  . Cataract   . Depression    situational  . GERD (gastroesophageal reflux disease)   . Goals of care, counseling/discussion 03/20/2017  . Hyperlipidemia   . Hypothyroidism   . Reactive depression 03/20/2017  . Thyroid cancer ([facility])    PMH of; on supressive therapy    Past Surgical History:  Procedure Laterality Date  . CATARACT EXTRACTION, BILATERAL  2013 & 2014   Dr Celene Squibb  . colon polyps  2002 & 2005   negative 2008; Dr Earlean Shawl  . COLONOSCOPY W/ POLYPECTOMY    . THYROIDECTOMY  2001   S/P RAI     Social History   Socioeconomic History  . Marital status: Married    Spouse name: None  . Number of children: 2  . Years of education: None  . Highest education level: None  Social Needs  . Financial resource strain: None  . Food insecurity - worry: None  . Food insecurity - inability: None  . Transportation needs - medical: None  . Transportation needs - non-medical: None  Occupational History  . Occupation: Retired  Tobacco Use  . Smoking status: Former Smoker    Packs/day: 1.00    Years: 5.00    Pack years: 5.00    Types: Cigarettes    Last attempt to quit: 12/07/1958    Years since quitting: 58.8  . Smokeless tobacco: Never Used  . Tobacco comment: smoked 1956-1960, up 1 ppd  Substance and Sexual Activity  . Alcohol use: Yes    Alcohol/week: 8.4 oz    Types: 14 Glasses of wine per week  . Drug use: No  . Sexual activity: None  Other Topics Concern  . None  Social History Narrative   Exercise:  Yard work, walks on occasion                Family History  Problem Relation Age of Onset  . Alzheimer's disease Mother   . Hypertension Mother   . Heart disease Father        CAD and angioplasty in 30s  . Cancer Father        Bladder  . Other Brother        valvular heart disease  . Diabetes Neg Hx   . Stroke Neg Hx     Review of Systems  Constitutional: Negative for appetite change, chills, fever and unexpected weight change.  Gastrointestinal: Negative for abdominal distention, abdominal pain, blood in stool, constipation, diarrhea and nausea.       Occ gerd, soft, at times explosive stools; no black stools  Neurological: Negative for light-headedness and headaches.       Objective:   Vitals:   10/13/17 1053  BP: 126/84  Pulse: 79  Resp: 16  Temp: 98.9 F (37.2 C)  SpO2: 95%   Filed Weights   10/13/17 1053  Weight: 215 lb (97.5 kg)   Body mass index is 29.99 kg/m.  Wt Readings from Last 3 Encounters:  10/13/17 215 lb (97.5 kg)   08/04/17 213 lb 8 oz (96.8 kg)  07/14/17 212 lb 1.6 oz (96.2 kg)     Physical Exam  Constitutional: He appears well-developed and well-nourished. No distress.  HENT:  Head: Normocephalic and atraumatic.  Cardiovascular: Normal rate, regular rhythm and normal heart sounds.  No murmur heard. Pulmonary/Chest: Effort normal. No respiratory distress. He has no wheezes. He has no rales.  Abdominal: Soft. He exhibits no distension and no mass. There is no tenderness. There is no  rebound and no guarding.  Musculoskeletal: He exhibits no edema.  Skin: Skin is warm and dry. He is not diaphoretic.          Assessment & Plan:   See Problem List for Assessment and Plan of chronic medical problems.

## 2017-10-12 NOTE — Telephone Encounter (Signed)
Patient Name: ESTEFAN PATTISON  DOB: 1934-07-16    Initial Comment Caller states husband having diarrhea not stopping 2-3 days w/horrible odor;    Nurse Assessment  Nurse: Raphael Gibney, RN, Vera Date/Time (Eastern Time): 10/12/2017 2:10:27 PM  Confirm and document reason for call. If symptomatic, describe symptoms. ---Caller states spouse has been having diarrhea for 2 weeks. Diarrhea has a foul odor. No vomiting. He is urinating. He is drinking fluids. Has been having 2-3 diarrhea stools daily  Does the patient have any new or worsening symptoms? ---Yes  Will a triage be completed? ---Yes  Related visit to physician within the last 2 weeks? ---No  Does the PT have any chronic conditions? (i.e. diabetes, asthma, etc.) ---Yes  List chronic conditions. ---methsothelioma  Is this a behavioral health or substance abuse call? ---No     Guidelines    Guideline Title Affirmed Question Affirmed Notes  Cancer - Diarrhea Black or tarry bowel movements (Exception: chronic-unchanged black-grey bowel movements AND is taking iron pills or peptobismol)    Final Disposition User   Go to ED Now Raphael Gibney, RN, Vera    Comments  Caller states spouse will not agree to go to the ER. Called back line and spoke to [place] and gave report that pt has had diarrhea for 2 weeks and stools are black with a foul odor. Triage outcome of go to ER now but pt does not want to go.   Referrals  GO TO FACILITY REFUSED   Caller Disagree/Comply Disagree  Caller Understands Yes  PreDisposition Call Doctor

## 2017-10-12 NOTE — Telephone Encounter (Signed)
Spoke with pts spouse and Dr Quay Burow. Appt schedule for tomorrow.

## 2017-10-13 ENCOUNTER — Ambulatory Visit: Payer: PPO | Admitting: Internal Medicine

## 2017-10-13 ENCOUNTER — Other Ambulatory Visit (INDEPENDENT_AMBULATORY_CARE_PROVIDER_SITE_OTHER): Payer: PPO

## 2017-10-13 ENCOUNTER — Encounter: Payer: Self-pay | Admitting: Internal Medicine

## 2017-10-13 VITALS — BP 126/84 | HR 79 | Temp 98.9°F | Resp 16 | Wt 215.0 lb

## 2017-10-13 DIAGNOSIS — E038 Other specified hypothyroidism: Secondary | ICD-10-CM | POA: Diagnosis not present

## 2017-10-13 DIAGNOSIS — G3184 Mild cognitive impairment, so stated: Secondary | ICD-10-CM | POA: Diagnosis not present

## 2017-10-13 DIAGNOSIS — Z23 Encounter for immunization: Secondary | ICD-10-CM

## 2017-10-13 DIAGNOSIS — R195 Other fecal abnormalities: Secondary | ICD-10-CM | POA: Diagnosis not present

## 2017-10-13 DIAGNOSIS — E538 Deficiency of other specified B group vitamins: Secondary | ICD-10-CM

## 2017-10-13 DIAGNOSIS — E039 Hypothyroidism, unspecified: Secondary | ICD-10-CM | POA: Insufficient documentation

## 2017-10-13 LAB — CBC WITH DIFFERENTIAL/PLATELET
Basophils Absolute: 0 10*3/uL (ref 0.0–0.1)
Basophils Relative: 1.2 % (ref 0.0–3.0)
EOS ABS: 0.1 10*3/uL (ref 0.0–0.7)
EOS PCT: 3.7 % (ref 0.0–5.0)
HCT: 48.1 % (ref 39.0–52.0)
Hemoglobin: 16.1 g/dL (ref 13.0–17.0)
LYMPHS ABS: 1.3 10*3/uL (ref 0.7–4.0)
Lymphocytes Relative: 32.6 % (ref 12.0–46.0)
MCHC: 33.6 g/dL (ref 30.0–36.0)
MCV: 105.2 fl — ABNORMAL HIGH (ref 78.0–100.0)
MONO ABS: 0.5 10*3/uL (ref 0.1–1.0)
Monocytes Relative: 13 % — ABNORMAL HIGH (ref 3.0–12.0)
NEUTROS PCT: 49.5 % (ref 43.0–77.0)
Neutro Abs: 2 10*3/uL (ref 1.4–7.7)
Platelets: 146 10*3/uL — ABNORMAL LOW (ref 150.0–400.0)
RBC: 4.57 Mil/uL (ref 4.22–5.81)
RDW: 13.7 % (ref 11.5–15.5)
WBC: 4 10*3/uL (ref 4.0–10.5)

## 2017-10-13 LAB — COMPREHENSIVE METABOLIC PANEL
ALK PHOS: 52 U/L (ref 39–117)
ALT: 31 U/L (ref 0–53)
AST: 45 U/L — AB (ref 0–37)
Albumin: 4.5 g/dL (ref 3.5–5.2)
BUN: 18 mg/dL (ref 6–23)
CHLORIDE: 103 meq/L (ref 96–112)
CO2: 27 mEq/L (ref 19–32)
Calcium: 9.8 mg/dL (ref 8.4–10.5)
Creatinine, Ser: 1.05 mg/dL (ref 0.40–1.50)
GFR: 72.01 mL/min (ref >60.00)
GLUCOSE: 120 mg/dL — AB (ref 70–99)
POTASSIUM: 4.1 meq/L (ref 3.5–5.1)
SODIUM: 138 meq/L (ref 135–145)
TOTAL PROTEIN: 7.2 g/dL (ref 6.0–8.3)
Total Bilirubin: 0.8 mg/dL (ref 0.2–1.2)

## 2017-10-13 LAB — VITAMIN B12: VITAMIN B 12: 411 pg/mL (ref 211–911)

## 2017-10-13 LAB — TSH: TSH: 6.1 u[IU]/mL — AB (ref 0.35–4.50)

## 2017-10-13 NOTE — Assessment & Plan Note (Signed)
H/o B12 def - not currently taking B12 Has mild cognitive dysfunction  Will check B12 level

## 2017-10-13 NOTE — Assessment & Plan Note (Signed)
Check tsh  Titrate med dose if needed  

## 2017-10-13 NOTE — Assessment & Plan Note (Signed)
Change in stool over the past two weeks - urgency to the point he is not able to make it to the bathroom Loose, foul smelling stool Dark stool - not obviously black or blood in stool Will check cbc, cmp, tsh Fecal occult Will refer to GI Will hold off on imaging given no pain in abdomen - he does have imaging scheduled for later this month for his cancer

## 2017-10-13 NOTE — Patient Instructions (Addendum)
  Test(s) ordered today. Your results will be released to [facility] (or called to you) after review, usually within 72hours after test completion. If any changes need to be made, you will be notified at that same time.  Flu immunization administered today.    Medications reviewed and updated.  Changes include decreasing your aleve intake.  Try taking tylenol.    A referral was ordered for neurology and GI.

## 2017-10-13 NOTE — Assessment & Plan Note (Signed)
Memory is not good Check B12 and tsh level Will refer back to neurology

## 2017-10-14 ENCOUNTER — Other Ambulatory Visit: Payer: Self-pay | Admitting: Internal Medicine

## 2017-10-14 MED ORDER — LEVOTHYROXINE SODIUM 150 MCG PO TABS: ORAL_TABLET | ORAL | 1 refills | Status: DC

## 2017-10-18 ENCOUNTER — Other Ambulatory Visit: Payer: Self-pay | Admitting: Emergency Medicine

## 2017-10-18 ENCOUNTER — Telehealth: Payer: Self-pay | Admitting: Internal Medicine

## 2017-10-18 ENCOUNTER — Other Ambulatory Visit: Payer: Self-pay | Admitting: Neurology

## 2017-10-18 DIAGNOSIS — R195 Other fecal abnormalities: Secondary | ICD-10-CM

## 2017-10-18 MED ORDER — LEVOTHYROXINE SODIUM 150 MCG PO TABS: ORAL_TABLET | ORAL | 1 refills | Status: DC

## 2017-10-18 NOTE — Telephone Encounter (Signed)
Please call [facility] on [address] in regard.  States last script they got was on 10/15 and for a quantity of 20.

## 2017-10-18 NOTE — Addendum Note (Signed)
Addended by: Terence Lux B on: 10/18/2017 04:24 PM   Modules accepted: Orders

## 2017-10-21 ENCOUNTER — Other Ambulatory Visit (INDEPENDENT_AMBULATORY_CARE_PROVIDER_SITE_OTHER): Payer: PPO

## 2017-10-21 DIAGNOSIS — R195 Other fecal abnormalities: Secondary | ICD-10-CM | POA: Diagnosis not present

## 2017-10-21 LAB — FECAL OCCULT BLOOD, IMMUNOCHEMICAL: Fecal Occult Bld: NEGATIVE

## 2017-10-24 ENCOUNTER — Telehealth: Payer: Self-pay | Admitting: Internal Medicine

## 2017-10-25 ENCOUNTER — Telehealth: Payer: Self-pay | Admitting: Internal Medicine

## 2017-10-25 MED ORDER — LEVOTHYROXINE SODIUM 150 MCG PO TABS: ORAL_TABLET | ORAL | 1 refills | Status: DC

## 2017-10-25 NOTE — Telephone Encounter (Signed)
Patient requesting 90 day script if this can be done.

## 2017-10-25 NOTE — Telephone Encounter (Signed)
Error

## 2017-11-01 ENCOUNTER — Ambulatory Visit: Payer: PPO | Admitting: Physician Assistant

## 2017-11-01 ENCOUNTER — Encounter: Payer: Self-pay | Admitting: Physician Assistant

## 2017-11-01 ENCOUNTER — Emergency Department (HOSPITAL_COMMUNITY)
Admission: EM | Admit: 2017-11-01 | Discharge: 2017-11-01 | Disposition: A | Discharge status: Home / Self Care | Payer: PPO | Attending: Emergency Medicine | Admitting: Emergency Medicine

## 2017-11-01 ENCOUNTER — Emergency Department (HOSPITAL_COMMUNITY): Payer: PPO

## 2017-11-01 ENCOUNTER — Ambulatory Visit (INDEPENDENT_AMBULATORY_CARE_PROVIDER_SITE_OTHER): Payer: PPO

## 2017-11-01 ENCOUNTER — Telehealth: Payer: Self-pay | Admitting: Neurology

## 2017-11-01 ENCOUNTER — Other Ambulatory Visit: Payer: Self-pay

## 2017-11-01 ENCOUNTER — Telehealth: Payer: Self-pay | Admitting: Internal Medicine

## 2017-11-01 VITALS — BP 90/66 | HR 86 | Temp 97.9°F | Resp 18 | Ht 71.0 in | Wt 214.1 lb

## 2017-11-01 DIAGNOSIS — S301XXA Contusion of abdominal wall, initial encounter: Secondary | ICD-10-CM | POA: Insufficient documentation

## 2017-11-01 DIAGNOSIS — Z87891 Personal history of nicotine dependence: Secondary | ICD-10-CM | POA: Diagnosis not present

## 2017-11-01 DIAGNOSIS — Y939 Activity, unspecified: Secondary | ICD-10-CM | POA: Insufficient documentation

## 2017-11-01 DIAGNOSIS — R9431 Abnormal electrocardiogram [ECG] [EKG]: Secondary | ICD-10-CM | POA: Diagnosis not present

## 2017-11-01 DIAGNOSIS — R0902 Hypoxemia: Secondary | ICD-10-CM | POA: Diagnosis not present

## 2017-11-01 DIAGNOSIS — Y929 Unspecified place or not applicable: Secondary | ICD-10-CM | POA: Diagnosis not present

## 2017-11-01 DIAGNOSIS — E890 Postprocedural hypothyroidism: Secondary | ICD-10-CM | POA: Diagnosis not present

## 2017-11-01 DIAGNOSIS — W19XXXA Unspecified fall, initial encounter: Secondary | ICD-10-CM | POA: Insufficient documentation

## 2017-11-01 DIAGNOSIS — S300XXA Contusion of lower back and pelvis, initial encounter: Secondary | ICD-10-CM | POA: Diagnosis present

## 2017-11-01 DIAGNOSIS — I712 Thoracic aortic aneurysm, without rupture, unspecified: Secondary | ICD-10-CM

## 2017-11-01 DIAGNOSIS — Z9181 History of falling: Secondary | ICD-10-CM | POA: Insufficient documentation

## 2017-11-01 DIAGNOSIS — Y998 Other external cause status: Secondary | ICD-10-CM | POA: Insufficient documentation

## 2017-11-01 DIAGNOSIS — R7981 Abnormal blood-gas level: Secondary | ICD-10-CM

## 2017-11-01 DIAGNOSIS — R109 Unspecified abdominal pain: Secondary | ICD-10-CM | POA: Diagnosis not present

## 2017-11-01 DIAGNOSIS — Z85850 Personal history of malignant neoplasm of thyroid: Secondary | ICD-10-CM | POA: Insufficient documentation

## 2017-11-01 DIAGNOSIS — R402441 Other coma, without documented Glasgow coma scale score, or with partial score reported, in the field [EMT or ambulance]: Secondary | ICD-10-CM | POA: Diagnosis not present

## 2017-11-01 DIAGNOSIS — R079 Chest pain, unspecified: Secondary | ICD-10-CM | POA: Diagnosis not present

## 2017-11-01 DIAGNOSIS — S2020XA Contusion of thorax, unspecified, initial encounter: Secondary | ICD-10-CM

## 2017-11-01 DIAGNOSIS — K5730 Diverticulosis of large intestine without perforation or abscess without bleeding: Secondary | ICD-10-CM | POA: Diagnosis not present

## 2017-11-01 LAB — URINALYSIS, ROUTINE W REFLEX MICROSCOPIC
BACTERIA UA: NONE SEEN
GLUCOSE, UA: NEGATIVE mg/dL
Hgb urine dipstick: NEGATIVE
KETONES UR: 5 mg/dL — AB
LEUKOCYTES UA: NEGATIVE
Nitrite: NEGATIVE
PH: 5 (ref 5.0–8.0)
Protein, ur: 100 mg/dL — AB
SPECIFIC GRAVITY, URINE: 1.026 (ref 1.005–1.030)

## 2017-11-01 LAB — POCT CBC
GRANULOCYTE PERCENT: 61.4 % (ref 37–80)
HEMATOCRIT: 50.3 % (ref 43.5–53.7)
Hemoglobin: 16.6 g/dL (ref 14.1–18.1)
Lymph, poc: 2.2 (ref 0.6–3.4)
MCH: 33.7 pg — AB (ref 27–31.2)
MCHC: 33.1 g/dL (ref 31.8–35.4)
MCV: 101.8 fL — AB (ref 80–97)
MID (CBC): 2.2 — AB (ref 0–0.9)
MPV: 7.5 fL (ref 0–99.8)
POC GRANULOCYTE: 4.6 (ref 2–6.9)
POC LYMPH %: 28.9 % (ref 10–50)
POC MID %: 9.7 %M (ref 0–12)
Platelet Count, POC: 156 10*3/uL (ref 142–424)
RBC: 4.94 M/uL (ref 4.69–6.13)
RDW, POC: 13.5 %
WBC: 7.5 10*3/uL (ref 4.6–10.2)

## 2017-11-01 LAB — COMPREHENSIVE METABOLIC PANEL
ALK PHOS: 52 U/L (ref 38–126)
ALT: 28 U/L (ref 17–63)
AST: 44 U/L — ABNORMAL HIGH (ref 15–41)
Albumin: 4 g/dL (ref 3.5–5.0)
Anion gap: 6 (ref 5–15)
BILIRUBIN TOTAL: 1.1 mg/dL (ref 0.3–1.2)
BUN: 15 mg/dL (ref 6–20)
CALCIUM: 8.9 mg/dL (ref 8.9–10.3)
CO2: 26 mmol/L (ref 22–32)
Chloride: 105 mmol/L (ref 101–111)
Creatinine, Ser: 1.06 mg/dL (ref 0.61–1.24)
Glucose, Bld: 126 mg/dL — ABNORMAL HIGH (ref 65–99)
Potassium: 4.1 mmol/L (ref 3.5–5.1)
Sodium: 137 mmol/L (ref 135–145)
TOTAL PROTEIN: 6.9 g/dL (ref 6.5–8.1)

## 2017-11-01 LAB — TROPONIN I

## 2017-11-01 LAB — CBC WITH DIFFERENTIAL/PLATELET
BASOS ABS: 0 10*3/uL (ref 0.0–0.1)
BASOS PCT: 1 %
EOS PCT: 1 %
Eosinophils Absolute: 0.1 10*3/uL (ref 0.0–0.7)
HEMATOCRIT: 46.1 % (ref 39.0–52.0)
Hemoglobin: 15.5 g/dL (ref 13.0–17.0)
Lymphocytes Relative: 18 %
Lymphs Abs: 1 10*3/uL (ref 0.7–4.0)
MCH: 34.1 pg — ABNORMAL HIGH (ref 26.0–34.0)
MCHC: 33.6 g/dL (ref 30.0–36.0)
MCV: 101.5 fL — AB (ref 78.0–100.0)
MONO ABS: 0.7 10*3/uL (ref 0.1–1.0)
MONOS PCT: 12 %
Neutro Abs: 3.9 10*3/uL (ref 1.7–7.7)
Neutrophils Relative %: 68 %
PLATELETS: 131 10*3/uL — AB (ref 150–400)
RBC: 4.54 MIL/uL (ref 4.22–5.81)
RDW: 12.7 % (ref 11.5–15.5)
WBC: 5.8 10*3/uL (ref 4.0–10.5)

## 2017-11-01 LAB — BRAIN NATRIURETIC PEPTIDE: B NATRIURETIC PEPTIDE 5: 87.3 pg/mL (ref 0.0–100.0)

## 2017-11-01 MED ORDER — IOPAMIDOL (ISOVUE-370) INJECTION 76%
INTRAVENOUS | Status: AC
  Administered 2017-11-01: 100 mL
  Filled 2017-11-01: qty 100

## 2017-11-01 MED ORDER — SODIUM CHLORIDE 0.9 % IV SOLN
Freq: Once | INTRAVENOUS | Status: AC
  Administered 2017-11-01: 17:00:00 via INTRAVENOUS

## 2017-11-01 NOTE — ED Notes (Signed)
Pt in CT.

## 2017-11-01 NOTE — Telephone Encounter (Signed)
Called and spoke with Pts wife, Donnel Saxon, the daughter wanted to let us know of changes prior to coming in on Wednesday. Cecille Rubin has taken Pt to PCP, Lelon Frohlich rqsts that I call back tomorrow, but not before 10am.

## 2017-11-01 NOTE — Telephone Encounter (Signed)
Pt's wife called stating that he fell yesterday and has a bruise on his back and is having trouble breathing. I sent them over to [facility] to advise. She called back wanting to know if Dr Quay Burow could order a chest x-ray for him or something so that they can figure out what is going on. She said that there is no way that he would be able to wait around at a doctors office to be seen. I asked her what the nurse told them and she said that they advised for him to go to the nearest emergency room or urgent care but he did not want to go. Please advise.

## 2017-11-01 NOTE — Telephone Encounter (Signed)
Spoke with pts daughter, Cecille Rubin. States they are taking pt to [facility] this afternoon.

## 2017-11-01 NOTE — Telephone Encounter (Signed)
As I stated this morning he really needs to be seen by someone and I am full today.  I do not think a chest xray or back xray is enough.  Would he consider going to urgent care, which may be quicker and they can do an xray.   It is very concerning he is having difficulty breathing.

## 2017-11-01 NOTE — Progress Notes (Signed)
11/01/2017 5:46 PM   DOB: 1934-06-08 / MRN: [id number]  SUBJECTIVE:  Dean Pruitt is a 83 y.o. male with a history of mesothiloma here for the evaluation of a fall that occurred roughly 3 days ago. Tells me that he fell in the middle of the night and landed backwards striking his right ribs. He would no be here if not for his daughter.  When I ask Dean Pruitt why he is here he tells me "I feel fine." He denies SOB, DOE, cough, chest pain, and leg swelling.  His daughter tells me that he WILL NOT COMPLAIN about how he feels.    He is allergic to ativan [lorazepam]; tramadol; and fentanyl.    He  has a past medical history of Cataract, Depression, GERD (gastroesophageal reflux disease), Goals of care, counseling/discussion (03/20/2017), Hyperlipidemia, Hypothyroidism, Reactive depression (03/20/2017), and Thyroid cancer ([place]).    He  reports that he quit smoking about 58 years ago. His smoking use included cigarettes. He has a 5.00 pack-year smoking history. he has never used smokeless tobacco. He reports that he drinks about 8.4 oz of alcohol per week. He reports that he does not use drugs. He  has no sexual activity history on file. The patient  has a past surgical history that includes colon polyps (2002 & 2005); Thyroidectomy (2001); Cataract extraction, bilateral (2013 & 2014); Colonoscopy w/ polypectomy; Video assisted thoracoscopy (Left, 02/15/2017); Pleural effusion drainage (Left, 02/15/2017); Lung biopsy (Left, 02/15/2017); and Portacath placement (N/A, 03/25/2017).  His family history includes Alzheimer's disease in his mother; Cancer in his father; Heart disease in his father; Hypertension in his mother; Other in his brother.  Review of Systems  Constitutional: Negative for chills, diaphoresis and fever.  Gastrointestinal: Negative for nausea.  Skin: Negative for rash.  Neurological: Negative for dizziness.    The problem list and medications were reviewed and updated by myself  where necessary and exist elsewhere in the encounter.   OBJECTIVE:  BP 90/66 (BP Location: Left Arm, Patient Position: Sitting, Cuff Size: Normal)   Pulse 86   Temp 97.9 F (36.6 C) (Oral)   Resp 18   Ht 5\' 11"  (1.803 m)   Wt 214 lb 1.6 oz (97.1 kg)   SpO2 (!) 89% Comment: up to 90% after 1 min, then back down to 89%  BMI 29.86 kg/m    BP Readings from Last 3 Encounters:  11/01/17 90/66  10/13/17 126/84  08/04/17 (!) 146/93    Physical Exam  Constitutional: He is oriented to person, place, and time. He appears well-developed and well-nourished. He has a sickly appearance.  Cardiovascular: Normal rate and regular rhythm.  Pulmonary/Chest: Effort normal. No respiratory distress. He has no wheezes. He has rales (right lower lung field). He exhibits no tenderness.  Abdominal: Soft.  Musculoskeletal: Normal range of motion. He exhibits no edema.  Neurological: He is alert and oriented to person, place, and time.  Skin: Skin is warm. No rash noted. No erythema. There is pallor.    Results for orders placed or performed in visit on 11/01/17 (from the past 72 hour(s))  POCT CBC     Status: Abnormal   Collection Time: 11/01/17  4:58 PM  Result Value Ref Range   WBC 7.5 4.6 - 10.2 K/uL   Lymph, poc 2.2 0.6 - 3.4   POC LYMPH PERCENT 28.9 10 - 50 %L   MID (cbc) 2.2 (A) 0 - 0.9   POC MID % 9.7 0 - 12 %M  POC Granulocyte 4.6 2 - 6.9   Granulocyte percent 61.4 37 - 80 %G   RBC 4.94 4.69 - 6.13 M/uL   Hemoglobin 16.6 14.1 - 18.1 g/dL   HCT, POC 50.3 43.5 - 53.7 %   MCV 101.8 (A) 80 - 97 fL   MCH, POC 33.7 (A) 27 - 31.2 pg   MCHC 33.1 31.8 - 35.4 g/dL   RDW, POC 13.5 %   Platelet Count, POC 156 142 - 424 K/uL   MPV 7.5 0 - 99.8 fL    Dg Chest 2 View  Result Date: 11/01/2017 CLINICAL DATA:  [place] 3 days ago.  Right-sided chest pain. EXAM: CHEST  2 VIEW COMPARISON:  CT scan 08/02/2017 FINDINGS: The right IJV Port-A-Cath is stable. The cardiac silhouette, mediastinal and hilar  contours are within normal limits and unchanged. There is moderate tortuosity of the thoracic aorta. The lungs are grossly clear. Streaky basilar scarring changes, left greater than right. No infiltrates, effusion or pneumothorax. The bony thorax is intact.  No definite rib fractures. IMPRESSION: No acute cardiopulmonary findings and intact bony thorax. Electronically Signed   By: Marijo Sanes M.D.   On: 11/01/2017 16:58    ASSESSMENT AND PLAN:  [name] was seen today for fall.  Diagnoses and all orders for this visit:  Contusion of trunk, initial encounter -     POCT urinalysis dipstick -     DG Chest 2 View; Future  Low oxygen saturation: Sats well out of the range of normal for him.  He does have some rales on the lower right side however his xray is overall clear. Given the trauma and history of malignancy he may have PE. He likely needs admission and work up. We are transporting him to [facility] ED per patient request for further work up and monitoring.  -     POCT CBC    The patient is advised to call or return to clinic if he does not see an improvement in symptoms, or to seek the care of the closest emergency department if he worsens with the above plan.   Philis Fendt, MHS, PA-C [facility] 11/01/2017 5:46 PM

## 2017-11-01 NOTE — Patient Instructions (Signed)
     IF you received an x-ray today, you will receive an invoice from [facility]. Please contact [facility] at [phone] with questions or concerns regarding your invoice.   IF you received labwork today, you will receive an invoice from [facility]. Please contact [facility] at [phone] with questions or concerns regarding your invoice.   Our billing staff will not be able to assist you with questions regarding bills from these companies.  You will be contacted with the lab results as soon as they are available. The fastest way to get your results is to activate your My Chart account. Instructions are located on the last page of this paperwork. If you have not heard from us regarding the results in 2 weeks, please contact this office.     

## 2017-11-01 NOTE — Discharge Instructions (Signed)
You were seen in the ED today after a recent fall. Your labs and vitals were normal today. You do have a thoracic aortic aneurysm that will need follow up with your PCP in 6 months.

## 2017-11-01 NOTE — Telephone Encounter (Signed)
Patient daughter wants to talk to someone about what is going on with her dad please call

## 2017-11-01 NOTE — ED Notes (Signed)
Per EMS, [facility] and sent here for further evaluation. Had a fall on Sunday and has bruising to back. Patient alert and oriented x  2 per baseline. Urgent Care sent patient here because initial BP was 90/66 and SPO2 on RA was in mid 80s. Hx of pleural effusions. Per EMS, orthostatics were negative and patient denied any SHOB or chest pain.

## 2017-11-01 NOTE — ED Triage Notes (Signed)
Per EMS, patient from [place], where daughter reports she wanted patient to be seen because he had two recent falls with bruising to the back. Denies head injury and blood thinners. Altered at baseline. Ambulatory with shuffled gait. Called EMS because patient's O2 was 85% on RA. Patient has no complaints.  20g R AC  + orthostatics O2 91% on 6L BP 148/104

## 2017-11-01 NOTE — ED Provider Notes (Signed)
Emergency Department Provider Note   I have reviewed the triage vital signs and the nursing notes.   HISTORY  Chief Complaint Fall   HPI Dean Pruitt is a 83 y.o. male with PMH of HLD, HTN, and GERD presents to the ED with hypoxemia and low BP in the setting of recent falls and bruising to the lower back and right flank.  Patient states that he feels that everyone is overreacting.  He has been "getting along fine" and denies any fever, chills, or falls today.  He denies any chest pain or shortness of breath.  He states that his wife and daughter are concerned about his condition and so asked for further evaluation today at rehab.  He was found to have low oxygen levels and EMS was called.  He denies any chest pain or dyspnea at this time.  Denies headache, blurry vision, speech changes. Not anticoagulated.    Past Medical History:  Diagnosis Date  . Cataract   . Depression    situational  . GERD (gastroesophageal reflux disease)   . Goals of care, counseling/discussion 03/20/2017  . Hyperlipidemia   . Hypothyroidism   . Reactive depression 03/20/2017  . Thyroid cancer [facility])    PMH of; on supressive therapy    Patient Active Problem List   Diagnosis Date Noted  . Hypothyroidism 10/13/2017  . B12 deficiency 10/13/2017  . Change in stool 10/13/2017  . Port catheter in place 06/02/2017  . Degenerative arthritis of left knee 03/24/2017  . Goals of care, counseling/discussion 03/20/2017  . Encounter for antineoplastic chemotherapy 03/20/2017  . Reactive depression 03/20/2017  . Malignant mesothelioma of pleura ([place]) 02/19/2017  . History of lung biopsy 02/17/2017  . Pleural effusion 02/15/2017  . Greater trochanteric bursitis of left hip 12/31/2016  . Pleural effusion, left 10/27/2016  . infrarenal abdominal aortic aneurysm without rupture ([place]) - [place] needed 2020 10/22/2016  . Left lumbar radiculopathy 10/12/2016  . Bursitis of left hip 09/30/2016  . Left foot drop  09/30/2016  . Toe pain, left 08/13/2016  . Myalgia 07/04/2016  . Mild cognitive impairment 07/03/2016  . Adjustment disorder with depressed mood 07/03/2016  . Frequency of urination 09/26/2014  . Macular degeneration 06/22/2011  . Fasting hyperglycemia 05/20/2009  . NONSPECIFIC ABNORMAL ELECTROCARDIOGRAM 05/20/2009  . THYROID CANCER, HX OF 05/20/2009  . History of colonic polyps 05/20/2009  . Hyperlipidemia 11/21/2007    Past Surgical History:  Procedure Laterality Date  . CATARACT EXTRACTION, BILATERAL  2013 & 2014   Dr Celene Squibb  . colon polyps  2002 & 2005   negative 2008; Dr Earlean Shawl  . COLONOSCOPY W/ POLYPECTOMY    . LUNG BIOPSY Left 02/15/2017   Procedure: LUNG BIOPSY;  Surgeon: Melrose Nakayama, MD;  Location: [place];  Service: Thoracic;  Laterality: Left;  . PLEURAL EFFUSION DRAINAGE Left 02/15/2017   Procedure: DRAINAGE OF PLEURAL EFFUSION;  Surgeon: Melrose Nakayama, MD;  Location: [place];  Service: Thoracic;  Laterality: Left;  . PORTACATH PLACEMENT N/A 03/25/2017   Procedure: INSERTION PORT-A-CATH, right internal jugular;  Surgeon: Melrose Nakayama, MD;  Location: [place];  Service: Thoracic;  Laterality: N/A;  . THYROIDECTOMY  2001   S/P RAI  . VIDEO ASSISTED THORACOSCOPY Left 02/15/2017   Procedure: VIDEO ASSISTED THORACOSCOPY;  Surgeon: Melrose Nakayama, MD;  Location: [place];  Service: Thoracic;  Laterality: Left;    Current Outpatient Rx  . Order #: [id number] Class: Historical Med  . Order #: [id number] Class: Normal  .  Order #: [id number] Class: Normal  . Order #: [id number] Class: Normal  . Order #: [id number] Class: Normal  . Order #: [id number] Class: Normal  . Order #: [id number] Class: Historical Med  . Order #: [id number] Class: Normal  . Order #: [id number] Class: Normal    Allergies Ativan [lorazepam]; Tramadol; and Fentanyl  Family History  Problem Relation Age of Onset  . Alzheimer's disease Mother   . Hypertension Mother   . Heart disease Father         CAD and angioplasty in 55s  . Cancer Father        Bladder  . Other Brother        valvular heart disease  . Diabetes Neg Hx   . Stroke Neg Hx     Social History Social History   Tobacco Use  . Smoking status: Former Smoker    Packs/day: 1.00    Years: 5.00    Pack years: 5.00    Types: Cigarettes    Last attempt to quit: 12/07/1958    Years since quitting: 58.9  . Smokeless tobacco: Never Used  . Tobacco comment: smoked 1956-1960, up 1 ppd  Substance Use Topics  . Alcohol use: Yes    Alcohol/week: 8.4 oz    Types: 14 Glasses of wine per week  . Drug use: No    Review of Systems  Constitutional: No fever/chills Eyes: No visual changes. ENT: No sore throat. Cardiovascular: Denies chest pain. Respiratory: Denies shortness of breath. Asymptomatic low O2 sat with EMS.  Gastrointestinal: No abdominal pain.  No nausea, no vomiting.  No diarrhea.  No constipation. Genitourinary: Negative for dysuria. Musculoskeletal: Negative for back pain. Right flank bruising.  Skin: Negative for rash. Neurological: Negative for headaches, focal weakness or numbness.  10-point ROS otherwise negative.  ____________________________________________   PHYSICAL EXAM:  VITAL SIGNS: ED Triage Vitals [11/01/17 1806]  Enc Vitals Group     BP (!) 151/106     Pulse Rate 89     Resp 14     Temp 97.7 F (36.5 C)     Temp Source Oral     SpO2 (!) 88 %   Constitutional: Alert and oriented. Well appearing and in no acute distress. Eyes: Conjunctivae are normal. PERRL. EOMI. Head: Atraumatic. Nose: No congestion/rhinnorhea. Mouth/Throat: Mucous membranes are moist.  Oropharynx non-erythematous. Neck: No stridor.  No cervical spine tenderness to palpation. Cardiovascular: Normal rate, regular rhythm. Good peripheral circulation. Grossly normal heart sounds.   Respiratory: Normal respiratory effort.  No retractions. Lungs CTAB. Gastrointestinal: Soft and nontender. No distention. Right  lower back/flank bruising noted. No midline spine tenderness to palpation.  Musculoskeletal: No lower extremity tenderness nor edema. No gross deformities of extremities. Neurologic:  Normal speech and language. No gross focal neurologic deficits are appreciated.  Skin:  Skin is warm, dry and intact. No rash noted. Psychiatric: Mood and affect are normal. Speech and behavior are normal.  ____________________________________________   LABS (all labs ordered are listed, but only abnormal results are displayed)  Labs Reviewed  COMPREHENSIVE METABOLIC PANEL - Abnormal; Notable for the following components:      Result Value   Glucose, Bld 126 (*)    AST 44 (*)    All other components within normal limits  CBC WITH DIFFERENTIAL/PLATELET - Abnormal; Notable for the following components:   MCV 101.5 (*)    MCH 34.1 (*)    Platelets 131 (*)    All other components within normal limits  URINALYSIS, ROUTINE W REFLEX MICROSCOPIC - Abnormal;  Notable for the following components:   Color, Urine AMBER (*)    Bilirubin Urine SMALL (*)    Ketones, ur 5 (*)    Protein, ur 100 (*)    Squamous Epithelial / LPF 0-5 (*)    All other components within normal limits  URINE CULTURE  BRAIN NATRIURETIC PEPTIDE  TROPONIN I   ____________________________________________  EKG   EKG Interpretation  Date/Time:  Monday November 01 2017 19:03:05 EST Ventricular Rate:  83 PR Interval:    QRS Duration: 118 QT Interval:  396 QTC Calculation: 466 R Axis:   -86 Text Interpretation:  Sinus rhythm Consider left atrial enlargement Left anterior fascicular block Low voltage, precordial leads No STEMI.  Confirmed by Nanda Quinton [phone]) on 11/01/2017 7:06:55 PM Also confirmed by Nanda Quinton [phone]), editor Hattie Perch [phone])  on 11/02/2017 7:06:38 AM       ____________________________________________  RADIOLOGY  Dg Chest 2 View  Result Date: 11/01/2017 CLINICAL DATA:  [place] 3 days ago.   Right-sided chest pain. EXAM: CHEST  2 VIEW COMPARISON:  CT scan 08/02/2017 FINDINGS: The right IJV Port-A-Cath is stable. The cardiac silhouette, mediastinal and hilar contours are within normal limits and unchanged. There is moderate tortuosity of the thoracic aorta. The lungs are grossly clear. Streaky basilar scarring changes, left greater than right. No infiltrates, effusion or pneumothorax. The bony thorax is intact.  No definite rib fractures. IMPRESSION: No acute cardiopulmonary findings and intact bony thorax. Electronically Signed   By: Marijo Sanes M.D.   On: 11/01/2017 16:58   Ct Angio Chest Pe W And/or Wo Contrast  Result Date: 11/01/2017 CLINICAL DATA:  Hypoxia, hypotension after fall. History of mesothelioma. EXAM: CT ANGIOGRAPHY CHEST CT ABDOMEN AND PELVIS WITH CONTRAST TECHNIQUE: Multidetector CT imaging of the chest was performed using the standard protocol during bolus administration of intravenous contrast. Multiplanar CT image reconstructions and MIPs were obtained to evaluate the vascular anatomy. Multidetector CT imaging of the abdomen and pelvis was performed using the standard protocol during bolus administration of intravenous contrast. CONTRAST:  115mL ISOVUE-370 IOPAMIDOL (ISOVUE-370) INJECTION 76% COMPARISON:  CT scan of August 02, 2017. FINDINGS: CTA CHEST FINDINGS Cardiovascular: Satisfactory opacification of the pulmonary arteries to the segmental level. No evidence of pulmonary embolism. Mild cardiomegaly is noted. No pericardial effusion. 4.6 cm ascending thoracic aortic aneurysm is noted. Atherosclerosis of thoracic aorta is noted. Coronary artery calcifications are noted. Mediastinum/Nodes: No enlarged mediastinal, hilar, or axillary lymph nodes. Thyroid gland, trachea, and esophagus demonstrate no significant findings. Lungs/Pleura: No pneumothorax or pleural effusion is noted. Mild right posterior basilar subsegmental atelectasis is noted. Stable thickening of medial  pleura and left major fissure is noted. Musculoskeletal: No chest wall abnormality. No acute or significant osseous findings. Review of the MIP images confirms the above findings. CT ABDOMEN and PELVIS FINDINGS Hepatobiliary: No focal liver abnormality is seen. No gallstones, gallbladder wall thickening, or biliary dilatation. Pancreas: Unremarkable. No pancreatic ductal dilatation or surrounding inflammatory changes. Spleen: Normal in size without focal abnormality. Adrenals/Urinary Tract: Adrenal glands are unremarkable. Kidneys are normal, without renal calculi, focal lesion, or hydronephrosis. Bladder is unremarkable. Stomach/Bowel: Stomach is within normal limits. Appendix appears normal. No evidence of bowel wall thickening, distention, or inflammatory changes. Sigmoid diverticulosis is noted without inflammation. Vascular/Lymphatic: Aortic atherosclerosis. No enlarged abdominal or pelvic lymph nodes. Reproductive: Stable mild prostatic enlargement is noted. Other: No abdominal wall hernia or abnormality. No abdominopelvic ascites. Musculoskeletal: No acute or significant osseous findings. Review of the MIP images confirms  the above findings. IMPRESSION: No definite evidence of pulmonary embolus. Coronary artery calcifications are noted suggesting coronary artery disease. 4.6 cm ascending thoracic aortic aneurysm is noted. Ascending thoracic aortic aneurysm. Recommend semi-annual imaging followup by CTA or MRA and referral to cardiothoracic surgery if not already obtained. This recommendation follows 2010 ACCF/AHA/AATS/ACR/ASA/SCA/SCAI/SIR/STS/SVM Guidelines for the Diagnosis and Management of Patients With Thoracic Aortic Disease. Circulation. 2010; 121: Y174-B449. Sigmoid diverticulosis without inflammation. Aortic atherosclerosis. Stable mild prostatic enlargement. Electronically Signed   By: Marijo Conception, M.D.   On: 11/01/2017 21:04   Ct Abdomen Pelvis W Contrast  Result Date: 11/01/2017 CLINICAL  DATA:  Hypoxia, hypotension after fall. History of mesothelioma. EXAM: CT ANGIOGRAPHY CHEST CT ABDOMEN AND PELVIS WITH CONTRAST TECHNIQUE: Multidetector CT imaging of the chest was performed using the standard protocol during bolus administration of intravenous contrast. Multiplanar CT image reconstructions and MIPs were obtained to evaluate the vascular anatomy. Multidetector CT imaging of the abdomen and pelvis was performed using the standard protocol during bolus administration of intravenous contrast. CONTRAST:  149mL ISOVUE-370 IOPAMIDOL (ISOVUE-370) INJECTION 76% COMPARISON:  CT scan of August 02, 2017. FINDINGS: CTA CHEST FINDINGS Cardiovascular: Satisfactory opacification of the pulmonary arteries to the segmental level. No evidence of pulmonary embolism. Mild cardiomegaly is noted. No pericardial effusion. 4.6 cm ascending thoracic aortic aneurysm is noted. Atherosclerosis of thoracic aorta is noted. Coronary artery calcifications are noted. Mediastinum/Nodes: No enlarged mediastinal, hilar, or axillary lymph nodes. Thyroid gland, trachea, and esophagus demonstrate no significant findings. Lungs/Pleura: No pneumothorax or pleural effusion is noted. Mild right posterior basilar subsegmental atelectasis is noted. Stable thickening of medial pleura and left major fissure is noted. Musculoskeletal: No chest wall abnormality. No acute or significant osseous findings. Review of the MIP images confirms the above findings. CT ABDOMEN and PELVIS FINDINGS Hepatobiliary: No focal liver abnormality is seen. No gallstones, gallbladder wall thickening, or biliary dilatation. Pancreas: Unremarkable. No pancreatic ductal dilatation or surrounding inflammatory changes. Spleen: Normal in size without focal abnormality. Adrenals/Urinary Tract: Adrenal glands are unremarkable. Kidneys are normal, without renal calculi, focal lesion, or hydronephrosis. Bladder is unremarkable. Stomach/Bowel: Stomach is within normal limits.  Appendix appears normal. No evidence of bowel wall thickening, distention, or inflammatory changes. Sigmoid diverticulosis is noted without inflammation. Vascular/Lymphatic: Aortic atherosclerosis. No enlarged abdominal or pelvic lymph nodes. Reproductive: Stable mild prostatic enlargement is noted. Other: No abdominal wall hernia or abnormality. No abdominopelvic ascites. Musculoskeletal: No acute or significant osseous findings. Review of the MIP images confirms the above findings. IMPRESSION: No definite evidence of pulmonary embolus. Coronary artery calcifications are noted suggesting coronary artery disease. 4.6 cm ascending thoracic aortic aneurysm is noted. Ascending thoracic aortic aneurysm. Recommend semi-annual imaging followup by CTA or MRA and referral to cardiothoracic surgery if not already obtained. This recommendation follows 2010 ACCF/AHA/AATS/ACR/ASA/SCA/SCAI/SIR/STS/SVM Guidelines for the Diagnosis and Management of Patients With Thoracic Aortic Disease. Circulation. 2010; 121: Q759-F638. Sigmoid diverticulosis without inflammation. Aortic atherosclerosis. Stable mild prostatic enlargement. Electronically Signed   By: Marijo Conception, M.D.   On: 11/01/2017 21:04    ____________________________________________   PROCEDURES  Procedure(s) performed:   Procedures  None ____________________________________________   INITIAL IMPRESSION / ASSESSMENT AND PLAN / ED COURSE  Pertinent labs & imaging results that were available during my care of the patient were reviewed by me and considered in my medical decision making (see chart for details).  Patient presents with new onset hypoxemia and low blood pressures at rehab today.  He has had multiple recent falls and  has a small area of bruising in the right lower back/flank area.  No tenderness to palpation of the abdomen.  Given his frequent falls, bruising in this area, hypoxemia, hypotension with EMS plan for CT scan of the chest,  abdomen, pelvis.  No evidence of recent head trauma.  No cervical spine discomfort.  Plan for labs and EKG as well.  Patient had chest x-ray at outside facility which was normal.  Returned to the room to update patient regarding the CT findings and he is sitting at bedside after having unhooked himself from the monitor. He is with family and states that he feels fine and is ready for discharge. Discussed the incidental finding of thoracic aortic aneurysm and advised PCP follow up and surveillance. Spoke with family member Cecille Rubin by phone to update her as well. She will ensure the patient follows up with PCP. No clear cause found to explain hypoxemia but patient is in no acute distress and ambulatory briskly around the ED without difficulty. Plan for PCP follow up and return if symptoms worsen.   At this time, I do not feel there is any life-threatening condition present. I have reviewed and discussed all results (EKG, imaging, lab, urine as appropriate), exam findings with patient. I have reviewed nursing notes and appropriate previous records.  I feel the patient is safe to be discharged home without further emergent workup. Discussed usual and customary return precautions. Patient and family (if present) verbalize understanding and are comfortable with this plan.  Patient will follow-up with their primary care provider. If they do not have a primary care provider, information for follow-up has been provided to them. All questions have been answered.  ____________________________________________  FINAL CLINICAL IMPRESSION(S) / ED DIAGNOSES  Final diagnoses:  Fall, initial encounter  Hypoxemia  Flank pain  Traumatic ecchymosis of flank, initial encounter  Thoracic aortic aneurysm without rupture ([place])     MEDICATIONS GIVEN DURING THIS VISIT:  Medications  iopamidol (ISOVUE-370) 76 % injection (100 mLs  Contrast Given 11/01/17 1958)    Note:  This document was prepared using Dragon voice  recognition software and may include unintentional dictation errors.  Nanda Quinton, MD Emergency Medicine    Jakarius Flamenco, [facility], MD 11/02/17 [phone]

## 2017-11-02 ENCOUNTER — Telehealth: Payer: Self-pay | Admitting: Neurology

## 2017-11-02 NOTE — Telephone Encounter (Signed)
Cecille Rubin called and said when you call back to please call back on this number  CB# [phone]

## 2017-11-02 NOTE — Telephone Encounter (Signed)
Called and LM on VM for [place], Pts daughter

## 2017-11-02 NOTE — Telephone Encounter (Signed)
Pt's daughter, Cecille Rubin wants to let you know his memory has gotten bad. Having trouble with remotes and telephones. Dr Quay Burow suggested he should not drive, but Pt takes that as he has not been told not to drive. Falling again recently also. She witnessed one episode that was very strange and she said it may have been some sort of seizure. Family is suggesting or wanting some kind of testing. His wife Lelon Frohlich, is going into Hospice, her cancer has returned and Pt is not aware she will be going into [facility].

## 2017-11-03 ENCOUNTER — Ambulatory Visit: Payer: PPO | Admitting: Neurology

## 2017-11-03 ENCOUNTER — Encounter: Payer: Self-pay | Admitting: Neurology

## 2017-11-03 VITALS — BP 118/88 | HR 86 | Ht 73.0 in | Wt 215.8 lb

## 2017-11-03 DIAGNOSIS — F0390 Unspecified dementia without behavioral disturbance: Secondary | ICD-10-CM | POA: Diagnosis not present

## 2017-11-03 DIAGNOSIS — R2689 Other abnormalities of gait and mobility: Secondary | ICD-10-CM

## 2017-11-03 LAB — URINE CULTURE
Culture: <10000 — AB
SPECIAL REQUESTS: NORMAL

## 2017-11-03 MED ORDER — DONEPEZIL HCL 5 MG PO TABS: 5.0000 mg | ORAL_TABLET | Freq: Every day | ORAL | 0 refills | Status: DC

## 2017-11-03 NOTE — Patient Instructions (Signed)
1.  We will start donepezil (Aricept) 5mg  daily for four weeks.  If you are tolerating the medication, then after four weeks, we will increase the dose to 10mg  daily.  Side effects include nausea, vomiting, diarrhea, vivid dreams, and muscle cramps.  Please call the clinic if you experience any of these symptoms. 2.  We will get physical therapy to help with your balance 3.  I do not want you to drive.   4.  We will repeat neuropsychological testing 5.  Follow up with me after testing.

## 2017-11-03 NOTE — Progress Notes (Signed)
NEUROLOGY FOLLOW UP OFFICE NOTE  [place] [id number]  HISTORY OF PRESENT ILLNESS: Dean Pruitt is an 83 year old right-handed male with hyperlipidemia, elevated blood pressure and history of thyroid cancer s/p thyroidectomy who follows up for cognitive impairment.   UPDATE: He had been receiving B12 supplementation.  He was supposed to have repeat neuropsychological testing in May, however he has had other medical complications over the past year.  He was diagnosed with malignant mesothelioma in March and had been undergoing chemotherapy.  This past March 28, 2023, his grandson passed away.  Over the past year, he has had cognitive decline with confusion and recurrent falls.  He has had difficulty using common devices, such as the telephone and TV remote.  His family manages his finances and his medications.  He rarely drives.  He is able to dress, bathe and use the toilet himself.  His daughter stays with him and his wife.  He is unaware that his wife with ovarian cancer will be transferred to hospice  MRI of brain with and without contrast from 07/19/17 was personally reviewed and revealed moderate chronic small vessel disease but no evidence of metastasis or stroke. He does not think his memory has been a problem.  Recent B12 was 411.     HISTORY: His wife started noticing problems with memory in 2016.  He recognizes memory problems too, but is not as concerned as his wife.  He started to become disoriented driving on familiar routes.  From church, he would be unsure how to get to known restaurants.  He had trouble driving to his daughter's house, as well.  Once in a while, hew would misplace things, such as his checkbook.  Over the span of a few months, he forgot to pay some pills three times.  He has since set up auto-payments.  This past year, he began to struggle with paying his taxes.  He sets up his pillbox for the week every week, which works out well.  He once saw a close friend  from church whom he hasn't seen for several years.  He recognized him but couldn't remember his name.  Sleeping is poor.  He drinks 2 glasses of wine a day.  He does not exercise.  He denies depression, but he has some family stressors.  He has two adult children who are ill and he has a grandson with drug problems.    He underwent neuropsychological testing on 04/14/16.  Results were overall within normal limits, however there was evidence of mild executive dysfunction, as demonstrated with abnormalities in mental flexibility and set shifting, verbal fluency and clock drawing.  Although stress was likely the primary cause of his memory problems, these findings also suggest an underlying non-amnestic mild cognitive impairment.     B12 from 02/13/16 was 326. MRI of brain from 02/26/16 revealed age-related generalized cerebral volume loss and mild nonspecific white matter changes.   He is a Programmer, systems. His mother had Alzheimer's disease.  PAST MEDICAL HISTORY: Past Medical History:  Diagnosis Date  . Cataract   . Depression    situational  . GERD (gastroesophageal reflux disease)   . Goals of care, counseling/discussion 03/20/2017  . Hyperlipidemia   . Hypothyroidism   . Reactive depression 03/20/2017  . Thyroid cancer [facility])    PMH of; on supressive therapy    MEDICATIONS: Current Outpatient Medications on File Prior to Visit  Medication Sig Dispense Refill  . aspirin 81 MG tablet Take 81  mg by mouth daily.    Marland Kitchen escitalopram (LEXAPRO) 10 MG tablet TAKE ONE TABLET BY MOUTH ONCE DAILY AT BEDTIME 30 tablet 3  . escitalopram (LEXAPRO) 10 MG tablet TAKE 1 TABLET BY MOUTH AT BEDTIME 30 tablet 1  . gabapentin (NEURONTIN) 300 MG capsule TAKE ONE CAPSULE BY MOUTH AT NIGHT 30 capsule 3  . levothyroxine (SYNTHROID, LEVOTHROID) 150 MCG tablet TAKE 1 TABLET BY MOUTH ONCE DAILY MON- FRI AND 1/2 TABLET ON SAT & SUN 90 tablet 1  . lidocaine-prilocaine (EMLA) cream Apply 1 application topically as  needed. (Patient taking differently: Apply 1 application topically as needed (port access). ) 30 g 0  . omeprazole (PRILOSEC) 20 MG capsule Take 1 capsule (20 mg total) by mouth daily. -- Office visit needed for further refills 30 capsule 3  . Probiotic Product (PROBIOTIC PO) Take 1 tablet by mouth 2 (two) times daily.     . rosuvastatin (CRESTOR) 20 MG tablet Take 1 tablet (20 mg total) by mouth daily. (Patient taking differently: Take 10-20 mg by mouth daily. ) 90 tablet 3   No current facility-administered medications on file prior to visit.     ALLERGIES: Allergies  Allergen Reactions  . Ativan [Lorazepam] Other (See Comments)    Mental status change-"went crazy in the head"  . Tramadol Other (See Comments)    Altered mental status "crazy in the head'  . Fentanyl Other (See Comments)    hallucinations    FAMILY HISTORY: Family History  Problem Relation Age of Onset  . Alzheimer's disease Mother   . Hypertension Mother   . Heart disease Father        CAD and angioplasty in 38s  . Cancer Father        Bladder  . Other Brother        valvular heart disease  . Diabetes Neg Hx   . Stroke Neg Hx     SOCIAL HISTORY: Social History   Socioeconomic History  . Marital status: Married    Spouse name: Not on file  . Number of children: 2  . Years of education: Not on file  . Highest education level: Not on file  Social Needs  . Financial resource strain: Not on file  . Food insecurity - worry: Not on file  . Food insecurity - inability: Not on file  . Transportation needs - medical: Not on file  . Transportation needs - non-medical: Not on file  Occupational History  . Occupation: Retired  Tobacco Use  . Smoking status: Former Smoker    Packs/day: 1.00    Years: 5.00    Pack years: 5.00    Types: Cigarettes    Last attempt to quit: 12/07/1958    Years since quitting: 58.9  . Smokeless tobacco: Never Used  . Tobacco comment: smoked 1956-1960, up 1 ppd  Substance and  Sexual Activity  . Alcohol use: Yes    Alcohol/week: 8.4 oz    Types: 14 Glasses of wine per week  . Drug use: No  . Sexual activity: Not on file  Other Topics Concern  . Not on file  Social History Narrative   Exercise:  Yard work, walks on occasion                REVIEW OF SYSTEMS: Constitutional: No fevers, chills, or sweats, no generalized fatigue, change in appetite Eyes: No visual changes, double vision, eye pain Ear, nose and throat: No hearing loss, ear pain, nasal congestion, sore throat  Cardiovascular: No chest pain, palpitations Respiratory:  No shortness of breath at rest or with exertion, wheezes GastrointestinaI: No nausea, vomiting, diarrhea, abdominal pain, fecal incontinence Genitourinary:  No dysuria, urinary retention or frequency Musculoskeletal:  No neck pain, back pain Integumentary: No rash, pruritus, skin lesions Neurological: as above Psychiatric: No depression, insomnia, anxiety Endocrine: No palpitations, fatigue, diaphoresis, mood swings, change in appetite, change in weight, increased thirst Hematologic/Lymphatic:  No purpura, petechiae. Allergic/Immunologic: no itchy/runny eyes, nasal congestion, recent allergic reactions, rashes  PHYSICAL EXAM: Vitals:   11/03/17 1311  BP: 118/88  Pulse: 86  SpO2: 90%   General: No acute distress.  Patient appears well-groomed.  normal body habitus. Head:  Normocephalic/atraumatic Eyes:  Fundi examined but not visualized Neck: supple, no paraspinal tenderness, full range of motion Heart:  Regular rate and rhythm Lungs:  Clear to auscultation bilaterally Back: No paraspinal tenderness Neurological Exam: alert and oriented to self only (though year was 1957). Attention span and concentration impaired, recent memory poor, remote memory intact, fund of knowledge decreased.  Speech fluent and not dysarthric, language intact.   MMSE - Mini Mental State Exam 11/03/2017 11/05/2016 04/22/2016  Not completed: - -  Refused  Orientation to time 0 4 -  Orientation to Place 2 4 -  Registration 3 3 -  Attention/ Calculation 2 4 -  Recall 0 0 -  Language- name 2 objects 2 2 -  Language- repeat 1 1 -  Language- follow 3 step command 3 3 -  Language- read & follow direction 1 1 -  Write a sentence 1 1 -  Copy design 0 1 -  Total score 15 24 -   CN II-XII intact. Bulk and tone normal, muscle strength 5/5 throughout.  Sensation to light touch intact.  Deep tendon reflexes 2+ throughout, toes downgoing.  Finger to nose and heel to shin testing intact.  Gait normal, Romberg negative.  IMPRESSION: I think now he has progressed to dementia, probably Alzheimer's.  PLAN: 1.  Initiate Aricept 5mg  at bedtime for 1 month and then increase to 10mg  at bedtime if tolerated. 2.  PT for balance 3.  Instructed that he should no longer be driving. 4.  Repeat neuropsychological testing 5.  Follow up after testing.  25 minutes spent face to face with patient, over 50% spent discussing management and diagnosis.  Metta Clines, DO  CC: Billey Gosling, MD

## 2017-11-04 ENCOUNTER — Other Ambulatory Visit (HOSPITAL_BASED_OUTPATIENT_CLINIC_OR_DEPARTMENT_OTHER): Payer: PPO

## 2017-11-04 ENCOUNTER — Ambulatory Visit (HOSPITAL_COMMUNITY)
Admission: RE | Admit: 2017-11-04 | Discharge: 2017-11-04 | Disposition: A | Discharge status: Home / Self Care | Payer: PPO | Source: Ambulatory Visit | Attending: Internal Medicine | Admitting: Internal Medicine

## 2017-11-04 ENCOUNTER — Ambulatory Visit (HOSPITAL_COMMUNITY): Payer: PPO

## 2017-11-04 DIAGNOSIS — I517 Cardiomegaly: Secondary | ICD-10-CM | POA: Diagnosis not present

## 2017-11-04 DIAGNOSIS — J90 Pleural effusion, not elsewhere classified: Secondary | ICD-10-CM | POA: Diagnosis not present

## 2017-11-04 DIAGNOSIS — K4020 Bilateral inguinal hernia, without obstruction or gangrene, not specified as recurrent: Secondary | ICD-10-CM | POA: Diagnosis not present

## 2017-11-04 DIAGNOSIS — Z5111 Encounter for antineoplastic chemotherapy: Secondary | ICD-10-CM

## 2017-11-04 DIAGNOSIS — N400 Enlarged prostate without lower urinary tract symptoms: Secondary | ICD-10-CM | POA: Diagnosis not present

## 2017-11-04 DIAGNOSIS — I714 Abdominal aortic aneurysm, without rupture: Secondary | ICD-10-CM | POA: Diagnosis not present

## 2017-11-04 DIAGNOSIS — K5730 Diverticulosis of large intestine without perforation or abscess without bleeding: Secondary | ICD-10-CM | POA: Diagnosis not present

## 2017-11-04 DIAGNOSIS — I700 Atherosclerosis of aorta: Secondary | ICD-10-CM | POA: Diagnosis not present

## 2017-11-04 DIAGNOSIS — F329 Major depressive disorder, single episode, unspecified: Secondary | ICD-10-CM | POA: Diagnosis not present

## 2017-11-04 DIAGNOSIS — J9811 Atelectasis: Secondary | ICD-10-CM | POA: Diagnosis not present

## 2017-11-04 DIAGNOSIS — I723 Aneurysm of iliac artery: Secondary | ICD-10-CM | POA: Insufficient documentation

## 2017-11-04 DIAGNOSIS — C450 Mesothelioma of pleura: Secondary | ICD-10-CM | POA: Diagnosis not present

## 2017-11-04 DIAGNOSIS — K4090 Unilateral inguinal hernia, without obstruction or gangrene, not specified as recurrent: Secondary | ICD-10-CM | POA: Diagnosis not present

## 2017-11-04 DIAGNOSIS — N200 Calculus of kidney: Secondary | ICD-10-CM | POA: Diagnosis not present

## 2017-11-04 LAB — COMPREHENSIVE METABOLIC PANEL
ALBUMIN: 4.1 g/dL (ref 3.5–5.0)
ALK PHOS: 62 U/L (ref 40–150)
ALT: 22 U/L (ref 0–55)
ANION GAP: 11 meq/L (ref 3–11)
AST: 37 U/L — ABNORMAL HIGH (ref 5–34)
BILIRUBIN TOTAL: 1.32 mg/dL — AB (ref 0.20–1.20)
BUN: 20.1 mg/dL (ref 7.0–26.0)
CO2: 26 mEq/L (ref 22–29)
Calcium: 9.6 mg/dL (ref 8.4–10.4)
Chloride: 103 mEq/L (ref 98–109)
Creatinine: 1.2 mg/dL (ref 0.7–1.3)
EGFR: 59 mL/min/{1.73_m2} — AB (ref >60)
GLUCOSE: 115 mg/dL (ref 70–140)
Potassium: 4.1 mEq/L (ref 3.5–5.1)
SODIUM: 140 meq/L (ref 136–145)
TOTAL PROTEIN: 7.4 g/dL (ref 6.4–8.3)

## 2017-11-04 LAB — CBC WITH DIFFERENTIAL/PLATELET
BASO%: 1 % (ref 0.0–2.0)
Basophils Absolute: 0.1 10*3/uL (ref 0.0–0.1)
EOS%: 2.8 % (ref 0.0–7.0)
Eosinophils Absolute: 0.2 10*3/uL (ref 0.0–0.5)
HEMATOCRIT: 47.2 % (ref 38.4–49.9)
HGB: 15.7 g/dL (ref 13.0–17.1)
LYMPH#: 1.5 10*3/uL (ref 0.9–3.3)
LYMPH%: 21.1 % (ref 14.0–49.0)
MCH: 33.8 pg — ABNORMAL HIGH (ref 27.2–33.4)
MCHC: 33.2 g/dL (ref 32.0–36.0)
MCV: 101.9 fL — ABNORMAL HIGH (ref 79.3–98.0)
MONO#: 0.9 10*3/uL (ref 0.1–0.9)
MONO%: 12.8 % (ref 0.0–14.0)
NEUT#: 4.4 10*3/uL (ref 1.5–6.5)
NEUT%: 62.3 % (ref 39.0–75.0)
Platelets: 150 10*3/uL (ref 140–400)
RBC: 4.63 10*6/uL (ref 4.20–5.82)
RDW: 13.5 % (ref 11.0–14.6)
WBC: 7 10*3/uL (ref 4.0–10.3)

## 2017-11-04 MED ORDER — IOPAMIDOL (ISOVUE-300) INJECTION 61%
INTRAVENOUS | Status: AC
  Filled 2017-11-04: qty 100

## 2017-11-04 MED ORDER — IOPAMIDOL (ISOVUE-300) INJECTION 61%
100.0000 mL | Freq: Once | INTRAVENOUS | Status: AC | PRN
  Administered 2017-11-04: 100 mL via INTRAVENOUS

## 2017-11-08 ENCOUNTER — Encounter: Payer: Self-pay | Admitting: Internal Medicine

## 2017-11-08 ENCOUNTER — Telehealth: Payer: Self-pay | Admitting: Internal Medicine

## 2017-11-08 ENCOUNTER — Ambulatory Visit (HOSPITAL_BASED_OUTPATIENT_CLINIC_OR_DEPARTMENT_OTHER): Payer: PPO | Admitting: Internal Medicine

## 2017-11-08 VITALS — BP 130/80 | HR 80 | Temp 98.2°F | Resp 18 | Ht 73.0 in | Wt 214.7 lb

## 2017-11-08 DIAGNOSIS — C450 Mesothelioma of pleura: Secondary | ICD-10-CM

## 2017-11-08 NOTE — Telephone Encounter (Signed)
Scheduled appt per 12/3 los - Gave patient AVS and calender per los. Central radiology to contact patient with ct

## 2017-11-08 NOTE — Progress Notes (Signed)
[place] [phone]   [phone]  OFFICE PROGRESS NOTE  [name], MD [place] [address]  DIAGNOSIS: Stage II malignant pleural mesothelioma, epithelioid type involving the left hemithorax diagnosed in March 2018.  PRIOR THERAPY:  1) Status post left VATS with biopsy and wedge resection of the left lower lobe as well as parietal pleurectomy under the care of Dr. Roxan Hockey on 02/15/2017. 2) Palliative systemic chemotherapy with carboplatin for AUC of 5, Alimta 500 MG/M2 and Avastin 15 MG/KG every 3 weeks. Status post 6 cycles.  CURRENT THERAPY: Observation.  INTERVAL HISTORY: Dean Pruitt 83 y.o. male returns to the clinic today for follow-up visit accompanied by his daughter.  Unfortunately his wife was recently diagnosed with metastatic ovarian cancer and she is currently with hospice.  The patient is feeling fine today but has few falls.  He continues to have intermittent pain on the left side of the chest.  He denied having any current shortness of breath, cough or hemoptysis.  He denied having any recent weight loss or night sweats.  He has no nausea, vomiting, diarrhea or constipation.  He had a repeat CT scan of the chest, abdomen and pelvis performed recently and he is here for evaluation and discussion of his discuss results.  MEDICAL HISTORY: Past Medical History:  Diagnosis Date  . Cataract   . Depression    situational  . GERD (gastroesophageal reflux disease)   . Goals of care, counseling/discussion 03/20/2017  . Hyperlipidemia   . Hypothyroidism   . Reactive depression 03/20/2017  . Thyroid cancer ([place])    PMH of; on supressive therapy    ALLERGIES:  is allergic to ativan [lorazepam]; tramadol; and fentanyl.  MEDICATIONS:  Current Outpatient Medications  Medication Sig Dispense Refill  . aspirin 81 MG tablet Take 81 mg by mouth daily.    Marland Kitchen donepezil (ARICEPT) 5 MG tablet Take 1 tablet (5 mg  total) by mouth at bedtime. 30 tablet 0  . escitalopram (LEXAPRO) 10 MG tablet TAKE ONE TABLET BY MOUTH ONCE DAILY AT BEDTIME 30 tablet 3  . escitalopram (LEXAPRO) 10 MG tablet TAKE 1 TABLET BY MOUTH AT BEDTIME 30 tablet 1  . gabapentin (NEURONTIN) 300 MG capsule TAKE ONE CAPSULE BY MOUTH AT NIGHT 30 capsule 3  . levothyroxine (SYNTHROID, LEVOTHROID) 150 MCG tablet TAKE 1 TABLET BY MOUTH ONCE DAILY MON- FRI AND 1/2 TABLET ON SAT & SUN 90 tablet 1  . lidocaine-prilocaine (EMLA) cream Apply 1 application topically as needed. (Patient taking differently: Apply 1 application topically as needed (port access). ) 30 g 0  . omeprazole (PRILOSEC) 20 MG capsule Take 1 capsule (20 mg total) by mouth daily. -- Office visit needed for further refills 30 capsule 3  . Probiotic Product (PROBIOTIC PO) Take 1 tablet by mouth 2 (two) times daily.     . rosuvastatin (CRESTOR) 20 MG tablet Take 1 tablet (20 mg total) by mouth daily. (Patient taking differently: Take 10-20 mg by mouth daily. ) 90 tablet 3   No current facility-administered medications for this visit.     SURGICAL HISTORY:  Past Surgical History:  Procedure Laterality Date  . CATARACT EXTRACTION, BILATERAL  2013 & 2014   Dr Celene Squibb  . colon polyps  2002 & 2005   negative 2008; Dr Earlean Shawl  . COLONOSCOPY W/ POLYPECTOMY    . LUNG BIOPSY Left 02/15/2017   Procedure: LUNG BIOPSY;  Surgeon: Melrose Nakayama,  MD;  Location: [facility];  Service: Thoracic;  Laterality: Left;  . PLEURAL EFFUSION DRAINAGE Left 02/15/2017   Procedure: DRAINAGE OF PLEURAL EFFUSION;  Surgeon: Melrose Nakayama, MD;  Location: [facility];  Service: Thoracic;  Laterality: Left;  . PORTACATH PLACEMENT N/A 03/25/2017   Procedure: INSERTION PORT-A-CATH, right internal jugular;  Surgeon: Melrose Nakayama, MD;  Location: [facility];  Service: Thoracic;  Laterality: N/A;  . THYROIDECTOMY  2001   S/P RAI  . VIDEO ASSISTED THORACOSCOPY Left 02/15/2017   Procedure: VIDEO ASSISTED  THORACOSCOPY;  Surgeon: Melrose Nakayama, MD;  Location: [facility];  Service: Thoracic;  Laterality: Left;    REVIEW OF SYSTEMS:  A comprehensive review of systems was negative except for: Constitutional: positive for fatigue Respiratory: positive for pleurisy/chest pain Musculoskeletal: positive for arthralgias and muscle weakness   PHYSICAL EXAMINATION: General appearance: alert, cooperative, fatigued and no distress Head: Normocephalic, without obvious abnormality, atraumatic Neck: no adenopathy, no JVD, supple, symmetrical, trachea midline and thyroid not enlarged, symmetric, no tenderness/mass/nodules Lymph nodes: Cervical, supraclavicular, and axillary nodes normal. Resp: clear to auscultation bilaterally Back: symmetric, no curvature. ROM normal. No CVA tenderness. Cardio: regular rate and rhythm, S1, S2 normal, no murmur, click, rub or gallop GI: soft, non-tender; bowel sounds normal; no masses,  no organomegaly Extremities: extremities normal, atraumatic, no cyanosis or edema  ECOG PERFORMANCE STATUS: 1 - Symptomatic but completely ambulatory  Blood pressure 130/80, pulse 80, temperature 98.2 F (36.8 C), temperature source Oral, resp. rate 18, height 6\' 1"  (1.854 m), weight 214 lb 11.2 oz (97.4 kg), SpO2 94 %.  LABORATORY DATA: Lab Results  Component Value Date   WBC 7.0 11/04/2017   HGB 15.7 11/04/2017   HCT 47.2 11/04/2017   MCV 101.9 (H) 11/04/2017   PLT 150 11/04/2017      Chemistry      Component Value Date/Time   NA 140 11/04/2017 1210   K 4.1 11/04/2017 1210   CL 105 11/01/2017 1824   CO2 26 11/04/2017 1210   BUN 20.1 11/04/2017 1210   CREATININE 1.2 11/04/2017 1210      Component Value Date/Time   CALCIUM 9.6 11/04/2017 1210   ALKPHOS 62 11/04/2017 1210   AST 37 (H) 11/04/2017 1210   ALT 22 11/04/2017 1210   BILITOT 1.32 (H) 11/04/2017 1210       RADIOGRAPHIC STUDIES: Dg Chest 2 View  Result Date: 11/01/2017 CLINICAL DATA:  [place] 3 days ago.   Right-sided chest pain. EXAM: CHEST  2 VIEW COMPARISON:  CT scan 08/02/2017 FINDINGS: The right IJV Port-A-Cath is stable. The cardiac silhouette, mediastinal and hilar contours are within normal limits and unchanged. There is moderate tortuosity of the thoracic aorta. The lungs are grossly clear. Streaky basilar scarring changes, left greater than right. No infiltrates, effusion or pneumothorax. The bony thorax is intact.  No definite rib fractures. IMPRESSION: No acute cardiopulmonary findings and intact bony thorax. Electronically Signed   By: Marijo Sanes M.D.   On: 11/01/2017 16:58   Ct Chest W Contrast  Result Date: 11/05/2017 CLINICAL DATA:  Left-sided chest pain. Malignant mesothelioma of the pleura. Chemotherapy complete. EXAM: CT CHEST, ABDOMEN, AND PELVIS WITH CONTRAST TECHNIQUE: Multidetector CT imaging of the chest, abdomen and pelvis was performed following the standard protocol during bolus administration of intravenous contrast. CONTRAST:  133mL ISOVUE-300 IOPAMIDOL (ISOVUE-300) INJECTION 61% COMPARISON:  Multiple exams, including 11/01/2017 and PET-CT from 03/17/2017 FINDINGS: CT CHEST FINDINGS Cardiovascular: Coronary, aortic arch, and branch vessel atherosclerotic vascular disease.  Moderate cardiomegaly. Ascending thoracic aorta 4.5 cm in diameter. Right Port-A-Cath tip: SVC. Mediastinum/Nodes: Right lower paratracheal node at the level of the carina measures 10 mm in short axis, previously 11 mm on 03/17/2017. Lungs/Pleura: Trace right pleural effusion with bandlike density tracking in the right lower lobe mildly increased from 11/01/2017 but favoring atelectasis given the associated volume loss. Fluid and mild nodularity in the left major fissure reduced from 03/17/2017 but stable from 11/01/2017. Volume loss and likely adjacent pleural thickening in the left lower lobe adjacent to the descending thoracic aorta, but improved from 03/17/2017. Are over the posterior pleural thickening shown  on the 03/17/2017 PET-CT is improved and the paramediastinal pleural thickening has essentially resolved compared April. Overall the appearance suggests significant interval improvement from with regard to the mesothelioma. Musculoskeletal: Unremarkable CT ABDOMEN PELVIS FINDINGS Hepatobiliary: Unremarkable Pancreas: Unremarkable Spleen: Unremarkable Adrenals/Urinary Tract: Nonobstructive 2 mm left kidney lower pole calculus. Questionable 1 mm right mid to lower kidney calculus on image 85/4. Adrenal glands normal. Bladder unremarkable. Stomach/Bowel: Sigmoid diverticulosis. Vascular/Lymphatic: Aortoiliac atherosclerotic vascular disease. Infrarenal abdominal aortic aneurysm 3.4 cm transverse on image 84/2. Mild saccular aneurysmal dilatation of the right common iliac artery with some mural thrombus, diameter 2.6 cm. No pathologic adenopathy in the abdomen or pelvis identified. Reproductive: Prostatomegaly, the gland measures 5.9 by 4.3 by 5.2 cm (volume = 69 cm^3). Other: No supplemental non-categorized findings. Musculoskeletal: Fatty spermatic cords probably from indirect inguinal hernias containing adipose tissue. Lower lumbar degenerative facet arthropathy with mild left foraminal stenosis at L5-S1. IMPRESSION: 1. Although not appreciably changed from 3 days ago, the pleural thickening in the left hemithorax has significantly improved compared to the PET-CT from 03/17/2017, in the paramediastinal component has resolved. Residual pleural thickening noted along the major fissure and adjacent to the descending thoracic aorta, with trace pleural fluid. 2. Mildly worsened atelectasis in the right lower lobe with a trace right pleural effusion. 3. Similar appearance of ascending thoracic aorta, infrarenal abdominal aortic aneurysm, and a slightly saccular aneurysm of the right common iliac artery. Prior vascular follow up recommendations from 11/01/2017 are reaffirmed. 4. Borderline enlarged right lower paratracheal  lymph node at the level of the carina, although reduced in size from 03/17/2017. 5. Other imaging findings of potential clinical significance: Nonobstructive bilateral nephrolithiasis. Sigmoid diverticulosis. Aortic Atherosclerosis (ICD10-I70.0). Prostatomegaly. Small bilateral indirect inguinal hernias containing adipose tissue. Mild left foraminal impingement at L5-S1. Moderate cardiomegaly. Electronically Signed   By: Van Clines M.D.   On: 11/05/2017 09:14   Ct Angio Chest Pe W And/or Wo Contrast  Result Date: 11/01/2017 CLINICAL DATA:  Hypoxia, hypotension after fall. History of mesothelioma. EXAM: CT ANGIOGRAPHY CHEST CT ABDOMEN AND PELVIS WITH CONTRAST TECHNIQUE: Multidetector CT imaging of the chest was performed using the standard protocol during bolus administration of intravenous contrast. Multiplanar CT image reconstructions and MIPs were obtained to evaluate the vascular anatomy. Multidetector CT imaging of the abdomen and pelvis was performed using the standard protocol during bolus administration of intravenous contrast. CONTRAST:  131mL ISOVUE-370 IOPAMIDOL (ISOVUE-370) INJECTION 76% COMPARISON:  CT scan of August 02, 2017. FINDINGS: CTA CHEST FINDINGS Cardiovascular: Satisfactory opacification of the pulmonary arteries to the segmental level. No evidence of pulmonary embolism. Mild cardiomegaly is noted. No pericardial effusion. 4.6 cm ascending thoracic aortic aneurysm is noted. Atherosclerosis of thoracic aorta is noted. Coronary artery calcifications are noted. Mediastinum/Nodes: No enlarged mediastinal, hilar, or axillary lymph nodes. Thyroid gland, trachea, and esophagus demonstrate no significant findings. Lungs/Pleura: No pneumothorax or pleural effusion  is noted. Mild right posterior basilar subsegmental atelectasis is noted. Stable thickening of medial pleura and left major fissure is noted. Musculoskeletal: No chest wall abnormality. No acute or significant osseous findings.  Review of the MIP images confirms the above findings. CT ABDOMEN and PELVIS FINDINGS Hepatobiliary: No focal liver abnormality is seen. No gallstones, gallbladder wall thickening, or biliary dilatation. Pancreas: Unremarkable. No pancreatic ductal dilatation or surrounding inflammatory changes. Spleen: Normal in size without focal abnormality. Adrenals/Urinary Tract: Adrenal glands are unremarkable. Kidneys are normal, without renal calculi, focal lesion, or hydronephrosis. Bladder is unremarkable. Stomach/Bowel: Stomach is within normal limits. Appendix appears normal. No evidence of bowel wall thickening, distention, or inflammatory changes. Sigmoid diverticulosis is noted without inflammation. Vascular/Lymphatic: Aortic atherosclerosis. No enlarged abdominal or pelvic lymph nodes. Reproductive: Stable mild prostatic enlargement is noted. Other: No abdominal wall hernia or abnormality. No abdominopelvic ascites. Musculoskeletal: No acute or significant osseous findings. Review of the MIP images confirms the above findings. IMPRESSION: No definite evidence of pulmonary embolus. Coronary artery calcifications are noted suggesting coronary artery disease. 4.6 cm ascending thoracic aortic aneurysm is noted. Ascending thoracic aortic aneurysm. Recommend semi-annual imaging followup by CTA or MRA and referral to cardiothoracic surgery if not already obtained. This recommendation follows 2010 ACCF/AHA/AATS/ACR/ASA/SCA/SCAI/SIR/STS/SVM Guidelines for the Diagnosis and Management of Patients With Thoracic Aortic Disease. Circulation. 2010; 121: H062-B762. Sigmoid diverticulosis without inflammation. Aortic atherosclerosis. Stable mild prostatic enlargement. Electronically Signed   By: Marijo Conception, M.D.   On: 11/01/2017 21:04   Ct Abdomen Pelvis W Contrast  Result Date: 11/05/2017 CLINICAL DATA:  Left-sided chest pain. Malignant mesothelioma of the pleura. Chemotherapy complete. EXAM: CT CHEST, ABDOMEN, AND PELVIS  WITH CONTRAST TECHNIQUE: Multidetector CT imaging of the chest, abdomen and pelvis was performed following the standard protocol during bolus administration of intravenous contrast. CONTRAST:  181mL ISOVUE-300 IOPAMIDOL (ISOVUE-300) INJECTION 61% COMPARISON:  Multiple exams, including 11/01/2017 and PET-CT from 03/17/2017 FINDINGS: CT CHEST FINDINGS Cardiovascular: Coronary, aortic arch, and branch vessel atherosclerotic vascular disease. Moderate cardiomegaly. Ascending thoracic aorta 4.5 cm in diameter. Right Port-A-Cath tip: SVC. Mediastinum/Nodes: Right lower paratracheal node at the level of the carina measures 10 mm in short axis, previously 11 mm on 03/17/2017. Lungs/Pleura: Trace right pleural effusion with bandlike density tracking in the right lower lobe mildly increased from 11/01/2017 but favoring atelectasis given the associated volume loss. Fluid and mild nodularity in the left major fissure reduced from 03/17/2017 but stable from 11/01/2017. Volume loss and likely adjacent pleural thickening in the left lower lobe adjacent to the descending thoracic aorta, but improved from 03/17/2017. Are over the posterior pleural thickening shown on the 03/17/2017 PET-CT is improved and the paramediastinal pleural thickening has essentially resolved compared April. Overall the appearance suggests significant interval improvement from with regard to the mesothelioma. Musculoskeletal: Unremarkable CT ABDOMEN PELVIS FINDINGS Hepatobiliary: Unremarkable Pancreas: Unremarkable Spleen: Unremarkable Adrenals/Urinary Tract: Nonobstructive 2 mm left kidney lower pole calculus. Questionable 1 mm right mid to lower kidney calculus on image 85/4. Adrenal glands normal. Bladder unremarkable. Stomach/Bowel: Sigmoid diverticulosis. Vascular/Lymphatic: Aortoiliac atherosclerotic vascular disease. Infrarenal abdominal aortic aneurysm 3.4 cm transverse on image 84/2. Mild saccular aneurysmal dilatation of the right common iliac  artery with some mural thrombus, diameter 2.6 cm. No pathologic adenopathy in the abdomen or pelvis identified. Reproductive: Prostatomegaly, the gland measures 5.9 by 4.3 by 5.2 cm (volume = 69 cm^3). Other: No supplemental non-categorized findings. Musculoskeletal: Fatty spermatic cords probably from indirect inguinal hernias containing adipose tissue. Lower lumbar degenerative facet arthropathy  with mild left foraminal stenosis at L5-S1. IMPRESSION: 1. Although not appreciably changed from 3 days ago, the pleural thickening in the left hemithorax has significantly improved compared to the PET-CT from 03/17/2017, in the paramediastinal component has resolved. Residual pleural thickening noted along the major fissure and adjacent to the descending thoracic aorta, with trace pleural fluid. 2. Mildly worsened atelectasis in the right lower lobe with a trace right pleural effusion. 3. Similar appearance of ascending thoracic aorta, infrarenal abdominal aortic aneurysm, and a slightly saccular aneurysm of the right common iliac artery. Prior vascular follow up recommendations from 11/01/2017 are reaffirmed. 4. Borderline enlarged right lower paratracheal lymph node at the level of the carina, although reduced in size from 03/17/2017. 5. Other imaging findings of potential clinical significance: Nonobstructive bilateral nephrolithiasis. Sigmoid diverticulosis. Aortic Atherosclerosis (ICD10-I70.0). Prostatomegaly. Small bilateral indirect inguinal hernias containing adipose tissue. Mild left foraminal impingement at L5-S1. Moderate cardiomegaly. Electronically Signed   By: Van Clines M.D.   On: 11/05/2017 09:14   Ct Abdomen Pelvis W Contrast  Result Date: 11/01/2017 CLINICAL DATA:  Hypoxia, hypotension after fall. History of mesothelioma. EXAM: CT ANGIOGRAPHY CHEST CT ABDOMEN AND PELVIS WITH CONTRAST TECHNIQUE: Multidetector CT imaging of the chest was performed using the standard protocol during bolus  administration of intravenous contrast. Multiplanar CT image reconstructions and MIPs were obtained to evaluate the vascular anatomy. Multidetector CT imaging of the abdomen and pelvis was performed using the standard protocol during bolus administration of intravenous contrast. CONTRAST:  160mL ISOVUE-370 IOPAMIDOL (ISOVUE-370) INJECTION 76% COMPARISON:  CT scan of August 02, 2017. FINDINGS: CTA CHEST FINDINGS Cardiovascular: Satisfactory opacification of the pulmonary arteries to the segmental level. No evidence of pulmonary embolism. Mild cardiomegaly is noted. No pericardial effusion. 4.6 cm ascending thoracic aortic aneurysm is noted. Atherosclerosis of thoracic aorta is noted. Coronary artery calcifications are noted. Mediastinum/Nodes: No enlarged mediastinal, hilar, or axillary lymph nodes. Thyroid gland, trachea, and esophagus demonstrate no significant findings. Lungs/Pleura: No pneumothorax or pleural effusion is noted. Mild right posterior basilar subsegmental atelectasis is noted. Stable thickening of medial pleura and left major fissure is noted. Musculoskeletal: No chest wall abnormality. No acute or significant osseous findings. Review of the MIP images confirms the above findings. CT ABDOMEN and PELVIS FINDINGS Hepatobiliary: No focal liver abnormality is seen. No gallstones, gallbladder wall thickening, or biliary dilatation. Pancreas: Unremarkable. No pancreatic ductal dilatation or surrounding inflammatory changes. Spleen: Normal in size without focal abnormality. Adrenals/Urinary Tract: Adrenal glands are unremarkable. Kidneys are normal, without renal calculi, focal lesion, or hydronephrosis. Bladder is unremarkable. Stomach/Bowel: Stomach is within normal limits. Appendix appears normal. No evidence of bowel wall thickening, distention, or inflammatory changes. Sigmoid diverticulosis is noted without inflammation. Vascular/Lymphatic: Aortic atherosclerosis. No enlarged abdominal or pelvic  lymph nodes. Reproductive: Stable mild prostatic enlargement is noted. Other: No abdominal wall hernia or abnormality. No abdominopelvic ascites. Musculoskeletal: No acute or significant osseous findings. Review of the MIP images confirms the above findings. IMPRESSION: No definite evidence of pulmonary embolus. Coronary artery calcifications are noted suggesting coronary artery disease. 4.6 cm ascending thoracic aortic aneurysm is noted. Ascending thoracic aortic aneurysm. Recommend semi-annual imaging followup by CTA or MRA and referral to cardiothoracic surgery if not already obtained. This recommendation follows 2010 ACCF/AHA/AATS/ACR/ASA/SCA/SCAI/SIR/STS/SVM Guidelines for the Diagnosis and Management of Patients With Thoracic Aortic Disease. Circulation. 2010; 121: R678-L381. Sigmoid diverticulosis without inflammation. Aortic atherosclerosis. Stable mild prostatic enlargement. Electronically Signed   By: Marijo Conception, M.D.   On: 11/01/2017 21:04  ASSESSMENT AND PLAN:  This is a very pleasant 83 years old white male with malignant pleural mesothelioma involving the left hemothorax. He completed treatment with systemic chemotherapy with carboplatin, Alimta and Avastin status post 6 cycles. He has been tolerating the treatment fairly well with no significant adverse effects except for fatigue. The patient is currently on observation and he is feeling fine. His recent CT scan of the chest, abdomen and pelvis showed no evidence for disease progression. I discussed the scan results with the patient and his daughter and recommended for him to continue on observation with a repeat CT scan of the chest, abdomen and pelvis in 3 months for restaging of his disease. He was advised to call immediately if he has any concerning symptoms in the interval. The patient voices understanding of current disease status and treatment options and is in agreement with the current care plan. All questions were answered.  The patient knows to call the clinic with any problems, questions or concerns. We can certainly see the patient much sooner if necessary.  Disclaimer: This note was dictated with voice recognition software. Similar sounding words can inadvertently be transcribed and may not be corrected upon review.

## 2017-11-09 ENCOUNTER — Encounter: Payer: Self-pay | Admitting: Internal Medicine

## 2017-11-09 ENCOUNTER — Ambulatory Visit: Payer: PPO | Admitting: Internal Medicine

## 2017-11-09 VITALS — BP 134/90 | HR 64 | Temp 98.1°F | Resp 16 | Wt 214.0 lb

## 2017-11-09 DIAGNOSIS — E7849 Other hyperlipidemia: Secondary | ICD-10-CM

## 2017-11-09 DIAGNOSIS — R0789 Other chest pain: Secondary | ICD-10-CM | POA: Diagnosis not present

## 2017-11-09 DIAGNOSIS — I712 Thoracic aortic aneurysm, without rupture, unspecified: Secondary | ICD-10-CM | POA: Insufficient documentation

## 2017-11-09 DIAGNOSIS — R296 Repeated falls: Secondary | ICD-10-CM | POA: Diagnosis not present

## 2017-11-09 MED ORDER — ROSUVASTATIN CALCIUM 20 MG PO TABS: 20.0000 mg | ORAL_TABLET | Freq: Every day | ORAL | 3 refills | Status: DC

## 2017-11-09 MED ORDER — OXYCODONE HCL 5 MG PO TABS: 5.0000 mg | ORAL_TABLET | Freq: Four times a day (QID) | ORAL | 0 refills | Status: DC | PRN

## 2017-11-09 MED ORDER — GABAPENTIN 300 MG PO CAPS: ORAL_CAPSULE | ORAL | 3 refills | Status: DC

## 2017-11-09 NOTE — Assessment & Plan Note (Signed)
Muscular chest and back pain - started after falls Tylenol not effective Will renew oxycodone - his daughter gives him the medication as needed Advised to keep medication to a minimum

## 2017-11-09 NOTE — Progress Notes (Signed)
Subjective:    Patient ID: Dean Pruitt, male    DOB: 23-Aug-1934, 83 y.o.   MRN: [id number]  HPI The patient is here for follow up from the hospital.  His family called here after he fell at home 11/01/17.  He fell three times in one week.  One of the times he hit his back /side on his nightstand. We were not able to see him and advised him to be seen at the emergency room or urgent care.  He did go to urgent care and was directed to the emergency room for further further evaluation due to hypoxemia and low blood pressure.  When he was in the emergency room he had no complaints.  He did feel that everyone was overreacting, but he is a poor historian due to his dementia.  He did not complain of any shortness of breath, headache, blurry vision, fever or chills.  He had had multiple recent falls and bruising in the right lower back/flank area. His exam was normal.  He did have a full evaluation given his hypoxemia, bruising in the right back / flank area.  He had a CT of the chest, abdomen and pelvis.  There is no evidence of recent head trauma.  He did not have any cervical spine discomfort.  An EKG showed no acute changes.  Labs were baseline.  There is no evidence of a urinary tract infection.  Chest x-ray at urgent care was normal.  He did have an incidental finding of a thoracic aortic aneurysm and was advised to follow-up with me and to have this monitored.  No cause was found for the hypoxemia, but he was in no acute distress and ambulating and was thought safe for discharge.  When he gets up from bed he gets pain that starts in chest and in his back.  He denies pain any other time ( getting up from a chair or walking)..  Deep breaths increases the pain.  The pain started after he was in the hospital.    He has pain from his fall - in his back, chest and hips.  He has taken leftover oxycodone and that has helped.  He needs more pain medication.    Besides the pain he feels fine.     Hyperlipidemia: He is taking his medication daily. He is compliant with a low fat/cholesterol diet. He is not exercising regularly. He denies myalgias.  He needs a refill of the medication   Medications and allergies reviewed with patient and updated if appropriate.  Patient Active Problem List   Diagnosis Date Noted  . Hypothyroidism 10/13/2017  . B12 deficiency 10/13/2017  . Change in stool 10/13/2017  . Port catheter in place 06/02/2017  . Degenerative arthritis of left knee 03/24/2017  . Goals of care, counseling/discussion 03/20/2017  . Encounter for antineoplastic chemotherapy 03/20/2017  . Reactive depression 03/20/2017  . Malignant mesothelioma of pleura ([place]) 02/19/2017  . History of lung biopsy 02/17/2017  . Pleural effusion 02/15/2017  . Greater trochanteric bursitis of left hip 12/31/2016  . Pleural effusion, left 10/27/2016  . infrarenal abdominal aortic aneurysm without rupture ([place]) - [place] needed 2020 10/22/2016  . Left lumbar radiculopathy 10/12/2016  . Bursitis of left hip 09/30/2016  . Left foot drop 09/30/2016  . Toe pain, left 08/13/2016  . Myalgia 07/04/2016  . Mild cognitive impairment 07/03/2016  . Adjustment disorder with depressed mood 07/03/2016  . Frequency of urination 09/26/2014  . Macular degeneration 06/22/2011  .  Fasting hyperglycemia 05/20/2009  . NONSPECIFIC ABNORMAL ELECTROCARDIOGRAM 05/20/2009  . THYROID CANCER, HX OF 05/20/2009  . History of colonic polyps 05/20/2009  . Hyperlipidemia 11/21/2007    Current Outpatient Medications on File Prior to Visit  Medication Sig Dispense Refill  . aspirin 81 MG tablet Take 81 mg by mouth daily.    Marland Kitchen donepezil (ARICEPT) 5 MG tablet Take 1 tablet (5 mg total) by mouth at bedtime. 30 tablet 0  . escitalopram (LEXAPRO) 10 MG tablet TAKE ONE TABLET BY MOUTH ONCE DAILY AT BEDTIME 30 tablet 3  . levothyroxine (SYNTHROID, LEVOTHROID) 150 MCG tablet TAKE 1 TABLET BY MOUTH ONCE DAILY MON- FRI AND 1/2 TABLET  ON SAT & SUN 90 tablet 1  . lidocaine-prilocaine (EMLA) cream Apply 1 application topically as needed. (Patient taking differently: Apply 1 application topically as needed (port access). ) 30 g 0  . omeprazole (PRILOSEC) 20 MG capsule Take 1 capsule (20 mg total) by mouth daily. -- Office visit needed for further refills 30 capsule 3  . Probiotic Product (PROBIOTIC PO) Take 1 tablet by mouth 2 (two) times daily.      No current facility-administered medications on file prior to visit.     Past Medical History:  Diagnosis Date  . Cataract   . Depression    situational  . GERD (gastroesophageal reflux disease)   . Goals of care, counseling/discussion 03/20/2017  . Hyperlipidemia   . Hypothyroidism   . Reactive depression 03/20/2017  . Thyroid cancer ([place])    PMH of; on supressive therapy    Past Surgical History:  Procedure Laterality Date  . CATARACT EXTRACTION, BILATERAL  2013 & 2014   Dr Celene Squibb  . colon polyps  2002 & 2005   negative 2008; Dr Earlean Shawl  . COLONOSCOPY W/ POLYPECTOMY    . LUNG BIOPSY Left 02/15/2017   Procedure: LUNG BIOPSY;  Surgeon: Melrose Nakayama, MD;  Location: [facility];  Service: Thoracic;  Laterality: Left;  . PLEURAL EFFUSION DRAINAGE Left 02/15/2017   Procedure: DRAINAGE OF PLEURAL EFFUSION;  Surgeon: Melrose Nakayama, MD;  Location: [facility];  Service: Thoracic;  Laterality: Left;  . PORTACATH PLACEMENT N/A 03/25/2017   Procedure: INSERTION PORT-A-CATH, right internal jugular;  Surgeon: Melrose Nakayama, MD;  Location: [facility];  Service: Thoracic;  Laterality: N/A;  . THYROIDECTOMY  2001   S/P RAI  . VIDEO ASSISTED THORACOSCOPY Left 02/15/2017   Procedure: VIDEO ASSISTED THORACOSCOPY;  Surgeon: Melrose Nakayama, MD;  Location: [facility];  Service: Thoracic;  Laterality: Left;    Social History   Socioeconomic History  . Marital status: Married    Spouse name: None  . Number of children: 2  . Years of education: None  . Highest education level:  None  Social Needs  . Financial resource strain: None  . Food insecurity - worry: None  . Food insecurity - inability: None  . Transportation needs - medical: None  . Transportation needs - non-medical: None  Occupational History  . Occupation: Retired  Tobacco Use  . Smoking status: Former Smoker    Packs/day: 1.00    Years: 5.00    Pack years: 5.00    Types: Cigarettes    Last attempt to quit: 12/07/1958    Years since quitting: 58.9  . Smokeless tobacco: Never Used  . Tobacco comment: smoked 1956-1960, up 1 ppd  Substance and Sexual Activity  . Alcohol use: Yes    Alcohol/week: 8.4 oz    Types: 14 Glasses  of wine per week  . Drug use: No  . Sexual activity: None  Other Topics Concern  . None  Social History Narrative   Exercise:  Yard work, walks on occasion                Family History  Problem Relation Age of Onset  . Alzheimer's disease Mother   . Hypertension Mother   . Heart disease Father        CAD and angioplasty in 92s  . Cancer Father        Bladder  . Other Brother        valvular heart disease  . Diabetes Neg Hx   . Stroke Neg Hx     Review of Systems  Constitutional: Negative for appetite change, chills and fever.  Respiratory: Negative for cough, shortness of breath and wheezing.   Cardiovascular: Positive for chest pain. Negative for palpitations and leg swelling.  Gastrointestinal: Negative for abdominal pain and nausea.  Musculoskeletal: Positive for back pain.  Neurological: Negative for dizziness, light-headedness and headaches.       Objective:   Vitals:   11/09/17 1306  BP: 134/90  Pulse: 64  Resp: 16  Temp: 98.1 F (36.7 C)  SpO2: 94%   Wt Readings from Last 3 Encounters:  11/09/17 214 lb (97.1 kg)  11/08/17 214 lb 11.2 oz (97.4 kg)  11/03/17 215 lb 12.8 oz (97.9 kg)   Body mass index is 28.23 kg/m.   Physical Exam    Constitutional: Appears well-developed and well-nourished. No distress.  HENT:  Head:  Normocephalic and atraumatic.  Neck: Neck supple. No tracheal deviation present. No thyromegaly present.  No cervical lymphadenopathy Cardiovascular: Normal rate, regular rhythm and normal heart sounds.   No murmur heard. No carotid bruit .  No edema Pulmonary/Chest: Effort normal and breath sounds normal. No respiratory distress. No has no wheezes. No rales.  Skin: Skin is warm and dry. Not diaphoretic.  Psychiatric: Normal mood and affect. Behavior is normal.   Imaging: 11/01/17  Chest x-ray: No acute cardiopulmonary finding and intact bony thorax CT abdomen and pelvis, CT angios chest: No definite evidence of pulmonary embolism.  Coronary artery calcifications.  4.6 cm ascending thoracic aortic aneurysm.  Recommend semiannual imaging follow-up by CT or MRA and referral to cardiothoracic surgery if not already obtained.  Sigmoid diverticulosis without inflammation.  Aortic atherosclerosis.  Mild prostatic enlargement, stable.  Imaging 11/04/17  CT abdomen and pelvis, CT chest: No significant change from 3 days ago.  Pleural thickening in left hemithorax significantly improved compared to PET/CT from 03/17/17 in the paramediasinal component has resolved.  Residual thickening noted along major fissure and adjacent to the descending thoracic aorta with trace pleural fluid. Mild worsening atelectasis in the right lower lobe with trace right pleural effusion No change in ascending thoracic aorta Borderline enlarged right lower paratracheal lymph node at the level of carina, reduced from 03/17/17 Nonobstructive bilateral nephrolithiasis Sigmoid diverticulosis Aortic atherosclerosis Large prostate Small bilateral indirect inguinal hernias containing adipose tissue Mild left foraminal impingement at L5-S1 Moderate cardiomegaly   Assessment & Plan:    See Problem List for Assessment and Plan of chronic medical problems.

## 2017-11-09 NOTE — Assessment & Plan Note (Signed)
Neurology has ordered PT Advised to take fall precautions

## 2017-11-09 NOTE — Patient Instructions (Addendum)
   Medications reviewed and updated.  Changes include taking oxycodone as needed for moderate - severe pain.  Your prescription(s) have been submitted to your pharmacy. Please take as directed and contact our office if you believe you are having problem(s) with the medication(s).  A referral was ordered for cardiothoracic surgery.

## 2017-11-09 NOTE — Assessment & Plan Note (Signed)
Continue statin - refill sent to pharmacy

## 2017-11-09 NOTE — Assessment & Plan Note (Signed)
4.6 cm on ct scan Getting imaging Q 3 months for now for oncology Will refer to CTS for monitoring

## 2017-11-17 ENCOUNTER — Encounter: Payer: Self-pay | Admitting: Internal Medicine

## 2017-11-17 ENCOUNTER — Ambulatory Visit: Payer: PPO | Admitting: Internal Medicine

## 2017-11-17 VITALS — BP 122/84 | HR 85 | Temp 97.5°F | Resp 16 | Wt 210.0 lb

## 2017-11-17 DIAGNOSIS — M7070 Other bursitis of hip, unspecified hip: Secondary | ICD-10-CM | POA: Insufficient documentation

## 2017-11-17 DIAGNOSIS — M7071 Other bursitis of hip, right hip: Secondary | ICD-10-CM

## 2017-11-17 DIAGNOSIS — M545 Low back pain, unspecified: Secondary | ICD-10-CM | POA: Insufficient documentation

## 2017-11-17 MED ORDER — TIZANIDINE HCL 2 MG PO CAPS: 2.0000 mg | ORAL_CAPSULE | Freq: Three times a day (TID) | ORAL | 0 refills | Status: DC

## 2017-11-17 NOTE — Progress Notes (Signed)
Subjective:    Patient ID: Dean Pruitt, male    DOB: June 07, 1934, 83 y.o.   MRN: [id number]  HPI The patient is here for follow up.  He fell last month and injured his right hip and back.  He thinks he has right hip bursitis.  He had similar symptoms in the left hip in the past and Dr. Tamala Julian gave him an injection and it helped.  He was told it was bursitis.  He was hoping to see Dr. Tamala Julian today and get an injection.  He has an appointment with Dr. Raeford Razor tomorrow.  He has tried applying heat and is taking oxycodone, but it does not seem to help.  He feels the pain when he walks and when he lays on his right side.  Back pain: Since the fall he is also experienced lower back pain in the paravertebral/psoas muscles.  The oxycodone has not helped much.  He has tried heat and he thinks it helps slightly.  He has not tried ice.  He denies any numbness or tingling.  He denies any radiating pain down the legs.     Medications and allergies reviewed with patient and updated if appropriate.  Patient Active Problem List   Diagnosis Date Noted  . Thoracic aortic aneurysm without rupture ([place]) 11/09/2017  . Muscular chest pain 11/09/2017  . Recurrent falls 11/09/2017  . Hypothyroidism 10/13/2017  . B12 deficiency 10/13/2017  . Change in stool 10/13/2017  . Port catheter in place 06/02/2017  . Degenerative arthritis of left knee 03/24/2017  . Goals of care, counseling/discussion 03/20/2017  . Encounter for antineoplastic chemotherapy 03/20/2017  . Reactive depression 03/20/2017  . Malignant mesothelioma of pleura ([place]) 02/19/2017  . History of lung biopsy 02/17/2017  . Pleural effusion 02/15/2017  . Greater trochanteric bursitis of left hip 12/31/2016  . Pleural effusion, left 10/27/2016  . infrarenal abdominal aortic aneurysm without rupture ([place]) - [place] needed 2020 10/22/2016  . Left lumbar radiculopathy 10/12/2016  . Bursitis of left hip 09/30/2016  . Left foot drop 09/30/2016  .  Toe pain, left 08/13/2016  . Myalgia 07/04/2016  . Mild cognitive impairment 07/03/2016  . Adjustment disorder with depressed mood 07/03/2016  . Frequency of urination 09/26/2014  . Macular degeneration 06/22/2011  . Fasting hyperglycemia 05/20/2009  . NONSPECIFIC ABNORMAL ELECTROCARDIOGRAM 05/20/2009  . THYROID CANCER, HX OF 05/20/2009  . History of colonic polyps 05/20/2009  . Hyperlipidemia 11/21/2007    Current Outpatient Medications on File Prior to Visit  Medication Sig Dispense Refill  . aspirin 81 MG tablet Take 81 mg by mouth daily.    Marland Kitchen donepezil (ARICEPT) 5 MG tablet Take 1 tablet (5 mg total) by mouth at bedtime. 30 tablet 0  . escitalopram (LEXAPRO) 10 MG tablet TAKE ONE TABLET BY MOUTH ONCE DAILY AT BEDTIME 30 tablet 3  . gabapentin (NEURONTIN) 300 MG capsule TAKE ONE CAPSULE BY MOUTH AT NIGHT 90 capsule 3  . levothyroxine (SYNTHROID, LEVOTHROID) 150 MCG tablet TAKE 1 TABLET BY MOUTH ONCE DAILY MON- FRI AND 1/2 TABLET ON SAT & SUN 90 tablet 1  . lidocaine-prilocaine (EMLA) cream Apply 1 application topically as needed. (Patient taking differently: Apply 1 application topically as needed (port access). ) 30 g 0  . omeprazole (PRILOSEC) 20 MG capsule Take 1 capsule (20 mg total) by mouth daily. -- Office visit needed for further refills 30 capsule 3  . oxyCODONE (OXY IR/ROXICODONE) 5 MG immediate release tablet Take 1 tablet (5 mg total)  by mouth every 6 (six) hours as needed for severe pain. 30 tablet 0  . Probiotic Product (PROBIOTIC PO) Take 1 tablet by mouth 2 (two) times daily.     . rosuvastatin (CRESTOR) 20 MG tablet Take 1 tablet (20 mg total) by mouth daily. 90 tablet 3   No current facility-administered medications on file prior to visit.     Past Medical History:  Diagnosis Date  . Cataract   . Depression    situational  . GERD (gastroesophageal reflux disease)   . Goals of care, counseling/discussion 03/20/2017  . Hyperlipidemia   . Hypothyroidism   .  Reactive depression 03/20/2017  . Thyroid cancer ([place])    PMH of; on supressive therapy    Past Surgical History:  Procedure Laterality Date  . CATARACT EXTRACTION, BILATERAL  2013 & 2014   Dr Celene Squibb  . colon polyps  2002 & 2005   negative 2008; Dr Earlean Shawl  . COLONOSCOPY W/ POLYPECTOMY    . LUNG BIOPSY Left 02/15/2017   Procedure: LUNG BIOPSY;  Surgeon: Melrose Nakayama, MD;  Location: [facility];  Service: Thoracic;  Laterality: Left;  . PLEURAL EFFUSION DRAINAGE Left 02/15/2017   Procedure: DRAINAGE OF PLEURAL EFFUSION;  Surgeon: Melrose Nakayama, MD;  Location: [facility];  Service: Thoracic;  Laterality: Left;  . PORTACATH PLACEMENT N/A 03/25/2017   Procedure: INSERTION PORT-A-CATH, right internal jugular;  Surgeon: Melrose Nakayama, MD;  Location: [facility];  Service: Thoracic;  Laterality: N/A;  . THYROIDECTOMY  2001   S/P RAI  . VIDEO ASSISTED THORACOSCOPY Left 02/15/2017   Procedure: VIDEO ASSISTED THORACOSCOPY;  Surgeon: Melrose Nakayama, MD;  Location: [facility];  Service: Thoracic;  Laterality: Left;    Social History   Socioeconomic History  . Marital status: Married    Spouse name: None  . Number of children: 2  . Years of education: None  . Highest education level: None  Social Needs  . Financial resource strain: None  . Food insecurity - worry: None  . Food insecurity - inability: None  . Transportation needs - medical: None  . Transportation needs - non-medical: None  Occupational History  . Occupation: Retired  Tobacco Use  . Smoking status: Former Smoker    Packs/day: 1.00    Years: 5.00    Pack years: 5.00    Types: Cigarettes    Last attempt to quit: 12/07/1958    Years since quitting: 58.9  . Smokeless tobacco: Never Used  . Tobacco comment: smoked 1956-1960, up 1 ppd  Substance and Sexual Activity  . Alcohol use: Yes    Alcohol/week: 8.4 oz    Types: 14 Glasses of wine per week  . Drug use: No  . Sexual activity: None  Other Topics Concern  . None    Social History Narrative   Exercise:  Yard work, walks on occasion                Family History  Problem Relation Age of Onset  . Alzheimer's disease Mother   . Hypertension Mother   . Heart disease Father        CAD and angioplasty in 50s  . Cancer Father        Bladder  . Other Brother        valvular heart disease  . Diabetes Neg Hx   . Stroke Neg Hx     Review of Systems  Constitutional: Negative for fever.  Musculoskeletal: Positive for arthralgias and back  pain.  Neurological: Negative for weakness and numbness.       Objective:   Vitals:   11/17/17 1625  BP: 122/84  Pulse: 85  Resp: 16  Temp: (!) 97.5 F (36.4 C)  SpO2: 93%   Wt Readings from Last 3 Encounters:  11/17/17 210 lb (95.3 kg)  11/09/17 214 lb (97.1 kg)  11/08/17 214 lb 11.2 oz (97.4 kg)   Body mass index is 27.71 kg/m.   Physical Exam  Constitutional: He appears well-developed and well-nourished. No distress.  Musculoskeletal: He exhibits no edema.  Tenderness with palpation bilateral psoas muscles.  No tenderness along lumbar spine.  Tenderness lateral aspect of right hip and upper leg.  Full range of motion.  Neurological:  Normal sensation bilateral lower extremities.  Gait normal.  Skin: He is not diaphoretic.           Assessment & Plan:    See Problem List for Assessment and Plan of chronic medical problems.

## 2017-11-17 NOTE — Assessment & Plan Note (Signed)
Right hip Started after a fall Would like to have an injection like he had with his left hip by Dr. Tamala Julian Has an appointment with Dr. Raeford Razor tomorrow Try ice We will not make any changes since he is seeing sports medicine tomorrow

## 2017-11-17 NOTE — Assessment & Plan Note (Signed)
Lower back pain since fall last month No lumbar spine pain, more muscular pain without radiation Has tried heat and oxycodone Oxycodone does not seem to be helping Advised to try ice Can try tizanidine low-dose to see if that helps with muscle tightness-advised daughter to monitor for drowsiness/confusion Will see Dr. Raeford Razor tomorrow

## 2017-11-17 NOTE — Patient Instructions (Addendum)
See Dr Raeford Razor tomorrow.    Continue heat.  Try ice  Tizanidine - muscle relaxer prescribed.

## 2017-11-18 ENCOUNTER — Ambulatory Visit: Payer: PPO | Admitting: Family Medicine

## 2017-11-18 ENCOUNTER — Encounter: Payer: Self-pay | Admitting: Family Medicine

## 2017-11-18 DIAGNOSIS — M7061 Trochanteric bursitis, right hip: Secondary | ICD-10-CM

## 2017-11-18 NOTE — Patient Instructions (Signed)
Thank you for coming in,   Please let me know how you are doing.   You can try Aspercreme with lidocaine to rub on the area.   Please try the exercises.    Please feel free to call with any questions or concerns at any time, at [phone]. --Dr. Raeford Razor

## 2017-11-18 NOTE — Progress Notes (Signed)
Dean Pruitt - 83 y.o. male MRN [id number]  Date of birth: February 06, 1934  SUBJECTIVE:  Including CC & ROS.  Chief Complaint  Patient presents with  . Right Hip pain    Dean Pruitt is a 83 y.o. male that is here today for an evaluation of right hip pain. Pain has been increasing over the past two months. He fell last month and injured his right hip and back.  He was told he has bursitis in the left hip previously.  He had similar symptoms in the left hip in the past and has receieved an injection which improved his pain. He feels the pain when he walking and when he lays on his right side.He has not tried ice.  He denies any numbness or tingling.  He denies any radiating pain down the legs. Denies any radicular symptoms. Pain is moderate in nature.     Review of Systems  Constitutional: Negative for fever.  Respiratory: Negative for shortness of breath.   Cardiovascular: Negative for chest pain.  Musculoskeletal: Positive for back pain and myalgias. Negative for gait problem and joint swelling.  Skin: Negative for color change.  Neurological: Negative for weakness and numbness.  Hematological: Negative for adenopathy.  Psychiatric/Behavioral: Negative for agitation.    HISTORY: Past Medical, Surgical, Social, and Family History Reviewed & Updated per EMR.   Pertinent Historical Findings include:  Past Medical History:  Diagnosis Date  . Cataract   . Depression    situational  . GERD (gastroesophageal reflux disease)   . Goals of care, counseling/discussion 03/20/2017  . Hyperlipidemia   . Hypothyroidism   . Reactive depression 03/20/2017  . Thyroid cancer ([place])    PMH of; on supressive therapy    Past Surgical History:  Procedure Laterality Date  . CATARACT EXTRACTION, BILATERAL  2013 & 2014   Dr Celene Squibb  . colon polyps  2002 & 2005   negative 2008; Dr Earlean Shawl  . COLONOSCOPY W/ POLYPECTOMY    . LUNG BIOPSY Left 02/15/2017   Procedure: LUNG BIOPSY;  Surgeon: Melrose Nakayama, MD;  Location: [facility];  Service: Thoracic;  Laterality: Left;  . PLEURAL EFFUSION DRAINAGE Left 02/15/2017   Procedure: DRAINAGE OF PLEURAL EFFUSION;  Surgeon: Melrose Nakayama, MD;  Location: [facility];  Service: Thoracic;  Laterality: Left;  . PORTACATH PLACEMENT N/A 03/25/2017   Procedure: INSERTION PORT-A-CATH, right internal jugular;  Surgeon: Melrose Nakayama, MD;  Location: [facility];  Service: Thoracic;  Laterality: N/A;  . THYROIDECTOMY  2001   S/P RAI  . VIDEO ASSISTED THORACOSCOPY Left 02/15/2017   Procedure: VIDEO ASSISTED THORACOSCOPY;  Surgeon: Melrose Nakayama, MD;  Location: [facility];  Service: Thoracic;  Laterality: Left;    Allergies  Allergen Reactions  . Ativan [Lorazepam] Other (See Comments)    Mental status change-"went crazy in the head"  . Tramadol Other (See Comments)    Altered mental status "crazy in the head'  . Fentanyl Other (See Comments)    hallucinations    Family History  Problem Relation Age of Onset  . Alzheimer's disease Mother   . Hypertension Mother   . Heart disease Father        CAD and angioplasty in 65s  . Cancer Father        Bladder  . Other Brother        valvular heart disease  . Diabetes Neg Hx   . Stroke Neg Hx      Social History  Socioeconomic History  . Marital status: Married    Spouse name: Not on file  . Number of children: 2  . Years of education: Not on file  . Highest education level: Not on file  Social Needs  . Financial resource strain: Not on file  . Food insecurity - worry: Not on file  . Food insecurity - inability: Not on file  . Transportation needs - medical: Not on file  . Transportation needs - non-medical: Not on file  Occupational History  . Occupation: Retired  Tobacco Use  . Smoking status: Former Smoker    Packs/day: 1.00    Years: 5.00    Pack years: 5.00    Types: Cigarettes    Last attempt to quit: 12/07/1958    Years since quitting: 58.9  . Smokeless tobacco: Never Used    . Tobacco comment: smoked 1956-1960, up 1 ppd  Substance and Sexual Activity  . Alcohol use: Yes    Alcohol/week: 8.4 oz    Types: 14 Glasses of wine per week  . Drug use: No  . Sexual activity: Not on file  Other Topics Concern  . Not on file  Social History Narrative   Exercise:  Yard work, walks on occasion                 PHYSICAL EXAM:  VS: BP 130/68 (BP Location: Left Arm, Patient Position: Sitting, Cuff Size: Normal)   Pulse 73   Temp 98.2 F (36.8 C) (Oral)   Ht 6\' 1"  (1.854 m)   Wt 214 lb (97.1 kg)   SpO2 99%   BMI 28.23 kg/m  Physical Exam Gen: NAD, alert, cooperative with exam, well-appearing ENT: normal lips, normal nasal mucosa,  Eye: normal EOM, normal conjunctiva and lids CV:  no edema, +2 pedal pulses   Resp: no accessory muscle use, non-labored,  Skin: no rashes, no areas of induration  Neuro: normal tone, normal sensation to touch Psych:  normal insight, alert and oriented MSK:  Right Hip:  TTP of the GT  No TTP of the lumbar spine, paraspinal muscles, piriformis  Normal IR and ER  Bruising of the right buttock  Normal strength to resistance with hip flexion  Normal knee flexion and extension  Normal gait  Neurovascularly intact     Aspiration/Injection Procedure Note Dean Pruitt 1934-06-28  Procedure: Injection Indications: right GT pain   Procedure Details Consent: Risks of procedure as well as the alternatives and risks of each were explained to the (patient/caregiver).  Consent for procedure obtained. Time Out: Verified patient identification, verified procedure, site/side was marked, verified correct patient position, special equipment/implants available, medications/allergies/relevent history reviewed, required imaging and test results available.  Performed.  The area was cleaned with iodine and alcohol swabs.    The right greater trochanter was injected using 1 cc's of 40 mg Depomedrol and 3 cc's of 1% lidocaine with a 22 1  1/2" needle.  A sterile dressing was applied.  Patient did tolerate procedure well.    ASSESSMENT & PLAN:   Bursitis of hip Likely GT bursitis. No signs of weakness to suggest gluteal med syndrome.  - injection today  - counseled on HEP.  - if no improvement can consider PT

## 2017-11-18 NOTE — Assessment & Plan Note (Signed)
Likely GT bursitis. No signs of weakness to suggest gluteal med syndrome.  - injection today  - counseled on HEP.  - if no improvement can consider PT

## 2017-12-01 ENCOUNTER — Telehealth: Payer: Self-pay | Admitting: Neurology

## 2017-12-01 NOTE — Telephone Encounter (Signed)
They can make an appointment to discuss further

## 2017-12-01 NOTE — Telephone Encounter (Deleted)
Patient's daughter called and would like

## 2017-12-01 NOTE — Telephone Encounter (Signed)
Patient's daughter called and wanted to see if his medication can be adjusted. His memory is a lot worse. Please call. Thanks

## 2017-12-01 NOTE — Telephone Encounter (Signed)
Called and spoke with Dean Pruitt. She and her father would like to come in earlier to talk. After the last ov when Dr Tomi Likens recommended the Pt no longer drive, the Pt immediately said he had no problems driving and still would after Dr Tomi Likens left the room. On Christmas Eve, the Pt had [employer] come out and replace the key to his car and he drove to [facility] for the day.

## 2017-12-02 ENCOUNTER — Telehealth: Payer: Self-pay | Admitting: Neurology

## 2017-12-02 MED ORDER — DONEPEZIL HCL 10 MG PO TABS: 10.0000 mg | ORAL_TABLET | Freq: Every day | ORAL | 2 refills | Status: DC

## 2017-12-02 NOTE — Telephone Encounter (Signed)
LM for Cecille Rubin to call back and make sooner appt

## 2017-12-02 NOTE — Telephone Encounter (Signed)
Called and spoke with Cecille Rubin, advsd her I will send in Aricept, advsd her can increase from 5mg  QHS to 10mg  QHS as previously discussed with Dr Tomi Likens.

## 2017-12-02 NOTE — Telephone Encounter (Signed)
Spoke with patient's daughter. He is scheduled for 12/29/17 and on wait list. She wanted to know about his prescription, since it will be a few weeks? Thanks

## 2017-12-06 ENCOUNTER — Ambulatory Visit: Payer: Self-pay

## 2017-12-06 ENCOUNTER — Ambulatory Visit: Payer: Self-pay | Admitting: Nurse Practitioner

## 2017-12-06 VITALS — BP 128/82 | HR 77 | Temp 98.2°F | Resp 17

## 2017-12-06 DIAGNOSIS — H9201 Otalgia, right ear: Secondary | ICD-10-CM

## 2017-12-06 MED ORDER — PREDNISONE 10 MG (21) PO TBPK: ORAL_TABLET | ORAL | 0 refills | Status: AC

## 2017-12-06 NOTE — Telephone Encounter (Signed)
Pt called with right moderate to severe pain. Pt states that pain has been present for about a month. Has tried Qtip and was able to remove a small amount of ear wax and got a small amount of relief. Pt states that if while sleeping he lies on the right side, the ear pain wakes him up. Offices closed for the holiday. Pt referred to [facility] or local [facility]. Pt's daughter given phone number and address to [facility].   Reason for Disposition . [1] SEVERE pain AND [2] not improved 2 hours after taking analgesic medication (e.g., ibuprofen or acetaminophen)  Answer Assessment - Initial Assessment Questions 1. LOCATION: "Which ear is involved?"     right 2. ONSET: "When did the ear start hurting"      For a month 3. SEVERITY: "How bad is the pain?"  (Scale 1-10; mild, moderate or severe)   - MILD (1-3): doesn't interfere with normal activities    - MODERATE (4-7): interferes with normal activities or awakens from sleep    - SEVERE (8-10): excruciating pain, unable to do any normal activities      More of a nuisance than actual pain Describes a dull pain and interrupts sleep 4. URI SYMPTOMS: " Do you have a runny nose or cough?"    Nasal congestion 5. FEVER: "Do you have a fever?" If so, ask: "What is your temperature, how was it measured, and when did it start?"     no 6. CAUSE: "Have you been swimming recently?", "How often do you use Q-TIPS?", "Have you had any recent air travel or scuba diving?"     No swimming- uses Qtips when ear starts to hear once every 3 weeks at most  7. OTHER SYMPTOMS: "Do you have any other symptoms?" (e.g., headache, stiff neck, dizziness, vomiting, runny nose, decreased hearing)     No headache,no stiff neck, + dizziness (but had before ear problem), +runny nose, No decreased hearing. 8. PREGNANCY: "Is there any chance you are pregnant?" "When was your last menstrual period?"     n/a  Protocols used: EARACHE-A-AH

## 2017-12-06 NOTE — Progress Notes (Signed)
Subjective:     Dean Pruitt is a 83 y.o. male who presents with righ ear pain. Symptoms include: right ear pain. Onset of symptoms was several months ago, and have been unchanged since that time. Associated symptoms include: none.  Patient denies: chills, fever , post nasal drip, productive cough, sinus pressure, sneezing, sore throat and ear drainage. He is drinking plenty of fluids.  The following portions of the patient's history were reviewed and updated as appropriate: allergies, current medications and past medical history.  Review of Systems Constitutional: negative Eyes: negative Ears, nose, mouth, throat, and face: positive for earaches Respiratory: negative Cardiovascular: negative Neurological: negative Behavioral/Psych: negative   Objective:    BP 128/82 (BP Location: Right Arm, Patient Position: Sitting, Cuff Size: Normal)   Pulse 77   Temp 98.2 F (36.8 C) (Oral)   Resp 17   SpO2 91%  General:  alert, cooperative and no distress  Right Ear: TM opaque, able to visualize landmarks, no bulging, no erythema or TM or canal  Left Ear: TM opaque, able to visualize landmarks, no bulging, no erythema or TM or canal  Mouth:  lips, mucosa, and tongue normal; teeth and gums normal  Neck: no adenopathy, no carotid bruit, no JVD, supple, symmetrical, trachea midline and thyroid not enlarged, symmetric, no tenderness/mass/nodules     Assessment:   Right Otalgia.  Plan:    Treatment: Sterapred Dose Pack. OTC analgesia as needed. Fluids, rest, avoid carbonated/alcoholic and caffeinated beverages.  Follow up in 3 days if not improving. Follow up with PCP as needed.

## 2017-12-06 NOTE — Patient Instructions (Addendum)

## 2017-12-08 ENCOUNTER — Telehealth: Payer: Self-pay

## 2017-12-08 NOTE — Telephone Encounter (Signed)
Patient states the treatment works wonderful and he is wonderful.

## 2017-12-10 ENCOUNTER — Encounter: Payer: Self-pay | Admitting: Neurology

## 2017-12-10 ENCOUNTER — Ambulatory Visit: Payer: PPO | Admitting: Neurology

## 2017-12-10 VITALS — BP 118/70 | HR 62 | Ht 70.0 in | Wt 204.0 lb

## 2017-12-10 DIAGNOSIS — F4323 Adjustment disorder with mixed anxiety and depressed mood: Secondary | ICD-10-CM | POA: Diagnosis not present

## 2017-12-10 DIAGNOSIS — F0390 Unspecified dementia without behavioral disturbance: Secondary | ICD-10-CM | POA: Diagnosis not present

## 2017-12-10 MED ORDER — ESCITALOPRAM OXALATE 20 MG PO TABS: 20.0000 mg | ORAL_TABLET | Freq: Every day | ORAL | 2 refills | Status: DC

## 2017-12-10 NOTE — Progress Notes (Signed)
NEUROLOGY FOLLOW UP OFFICE NOTE  [place] [id number]  HISTORY OF PRESENT ILLNESS: Dean Pruitt is an 83 year old right-handed male with hyperlipidemia, elevated blood pressure and history of thyroid cancer s/p thyroidectomy who follows up for probable Alzheimer's dementia.  He is accompanied by his daughter who supplements history.   UPDATE:  In November, he was started on Aricept.  His wife passed away last month from ovarian cancer.  It has been difficult for him.  Afterward, he would experience visual hallucinations.  When the light was off, he would see people laying on the blades of the ceiling fan.  He wasn't scared or agitated by it.  It has since resolved.  He is tolerating Aricept.  He is scheduled for neuropsychological testing in March.  HISTORY: His wife started noticing problems with memory in 2016.  He recognizes memory problems too, but is not as concerned as his wife.  He started to become disoriented driving on familiar routes.  From church, he would be unsure how to get to known restaurants.  He had trouble driving to his daughter's house, as well.  Once in a while, hew would misplace things, such as his checkbook.  Over the span of a few months, he forgot to pay some pills three times.  He has since set up auto-payments.  This past year, he began to struggle with paying his taxes.  He sets up his pillbox for the week every week, which works out well.  He once saw a close friend from church whom he hasn't seen for several years.  He recognized him but couldn't remember his name.  Sleeping is poor.  He drinks 2 glasses of wine a day.   He underwent neuropsychological testing on 04/14/16.  Results were overall within normal limits, however there was evidence of mild executive dysfunction, as demonstrated with abnormalities in mental flexibility and set shifting, verbal fluency and clock drawing.  Although stress was likely the primary cause of his memory problems,  these findings also suggest an underlying non-amnestic mild cognitive impairment.   He had been receiving B12 supplementation.  He was diagnosed with malignant mesothelioma in March 2018 and had been undergoing chemotherapy.  This past 03-29-23, his grandson passed away.  Over the past year, he has had cognitive decline with confusion and recurrent falls.  He has had difficulty using common devices, such as the telephone and TV remote.  His family manages his finances and his medications.  He rarely drives.  He is able to dress, bathe and use the toilet himself.  His daughter stays with him and his wife.  He is unaware that his wife with ovarian cancer will be transferred to hospice  MRI of brain with and without contrast from 07/19/17 revealed moderate chronic small vessel disease but no evidence of metastasis or stroke. Recent B12 was 411.      B12 from 02/13/16 was 326; from 10/13/17 was 411. TSH from 10/13/17 was 6.10. MRI of brain from 02/26/16 revealed age-related generalized cerebral volume loss and mild nonspecific white matter changes.   He is a Programmer, systems. His mother had Alzheimer's disease.  PAST MEDICAL HISTORY: Past Medical History:  Diagnosis Date  . Cataract   . Depression    situational  . GERD (gastroesophageal reflux disease)   . Goals of care, counseling/discussion 03/20/2017  . Hyperlipidemia   . Hypothyroidism   . Reactive depression 03/20/2017  . Thyroid cancer [facility])    PMH of;  on supressive therapy    MEDICATIONS: Current Outpatient Medications on File Prior to Visit  Medication Sig Dispense Refill  . aspirin 81 MG tablet Take 81 mg by mouth daily.    Marland Kitchen donepezil (ARICEPT) 10 MG tablet Take 1 tablet (10 mg total) by mouth at bedtime. 30 tablet 2  . donepezil (ARICEPT) 5 MG tablet Take 1 tablet (5 mg total) by mouth at bedtime. (Patient not taking: Reported on 12/06/2017) 30 tablet 0  . gabapentin (NEURONTIN) 300 MG capsule TAKE ONE CAPSULE BY MOUTH AT NIGHT 90  capsule 3  . levothyroxine (SYNTHROID, LEVOTHROID) 150 MCG tablet TAKE 1 TABLET BY MOUTH ONCE DAILY MON- FRI AND 1/2 TABLET ON SAT & SUN 90 tablet 1  . lidocaine-prilocaine (EMLA) cream Apply 1 application topically as needed. (Patient taking differently: Apply 1 application topically as needed (port access). ) 30 g 0  . omeprazole (PRILOSEC) 20 MG capsule Take 1 capsule (20 mg total) by mouth daily. -- Office visit needed for further refills 30 capsule 3  . oxyCODONE (OXY IR/ROXICODONE) 5 MG immediate release tablet Take 1 tablet (5 mg total) by mouth every 6 (six) hours as needed for severe pain. 30 tablet 0  . predniSONE (STERAPRED UNI-PAK 21 TAB) 10 MG (21) TBPK tablet Take as directed. 21 tablet 0  . Probiotic Product (PROBIOTIC PO) Take 1 tablet by mouth 2 (two) times daily.     . rosuvastatin (CRESTOR) 20 MG tablet Take 1 tablet (20 mg total) by mouth daily. 90 tablet 3  . tizanidine (ZANAFLEX) 2 MG capsule Take 1 capsule (2 mg total) by mouth 3 (three) times daily. 30 capsule 0   No current facility-administered medications on file prior to visit.     ALLERGIES: Allergies  Allergen Reactions  . Ativan [Lorazepam] Other (See Comments)    Mental status change-"went crazy in the head"  . Tramadol Other (See Comments)    Altered mental status "crazy in the head'  . Fentanyl Other (See Comments)    hallucinations    FAMILY HISTORY: Family History  Problem Relation Age of Onset  . Alzheimer's disease Mother   . Hypertension Mother   . Heart disease Father        CAD and angioplasty in 19s  . Cancer Father        Bladder  . Other Brother        valvular heart disease  . Diabetes Neg Hx   . Stroke Neg Hx     SOCIAL HISTORY: Social History   Socioeconomic History  . Marital status: Married    Spouse name: Not on file  . Number of children: 2  . Years of education: Not on file  . Highest education level: Not on file  Social Needs  . Financial resource strain: Not on  file  . Food insecurity - worry: Not on file  . Food insecurity - inability: Not on file  . Transportation needs - medical: Not on file  . Transportation needs - non-medical: Not on file  Occupational History  . Occupation: Retired  Tobacco Use  . Smoking status: Former Smoker    Packs/day: 1.00    Years: 5.00    Pack years: 5.00    Types: Cigarettes    Last attempt to quit: 12/07/1958    Years since quitting: 59.0  . Smokeless tobacco: Never Used  . Tobacco comment: smoked 1956-1960, up 1 ppd  Substance and Sexual Activity  . Alcohol use: Yes  Alcohol/week: 8.4 oz    Types: 14 Glasses of wine per week  . Drug use: No  . Sexual activity: Not on file  Other Topics Concern  . Not on file  Social History Narrative   Exercise:  Yard work, walks on occasion                REVIEW OF SYSTEMS: Constitutional: No fevers, chills, or sweats, no generalized fatigue, change in appetite Eyes: No visual changes, double vision, eye pain Ear, nose and throat: No hearing loss, ear pain, nasal congestion, sore throat Cardiovascular: No chest pain, palpitations Respiratory:  No shortness of breath at rest or with exertion, wheezes GastrointestinaI: No nausea, vomiting, diarrhea, abdominal pain, fecal incontinence Genitourinary:  No dysuria, urinary retention or frequency Musculoskeletal:  No neck pain, back pain Integumentary: No rash, pruritus, skin lesions Neurological: as above Psychiatric: No depression, insomnia, anxiety Endocrine: No palpitations, fatigue, diaphoresis, mood swings, change in appetite, change in weight, increased thirst Hematologic/Lymphatic:  No purpura, petechiae. Allergic/Immunologic: no itchy/runny eyes, nasal congestion, recent allergic reactions, rashes  PHYSICAL EXAM: Vitals:   12/10/17 1138  BP: 118/70  Pulse: 62  SpO2: 94%   General: Tearful.  No acute distress.  Patient appears well-groomed.   Head:  Normocephalic/atraumatic  IMPRESSION: 1.   Probable Alzheimer's dementia 2.  Adjustment disorder with depression and anxiety  PLAN: 1.  We will increase escitalopram from 10mg  to 20mg  daily. 2.  He will continue Aricept 10mg  at bedtime 3.  He will follow up with me after repeat neuropsychological testing.  If testing consistent with Alzheimer's, consider starting Namenda as well.  25 minutes spent face to face with patient, 100% spent discussing symptoms and management.  Metta Clines, DO  CC:  Billey Gosling, MD

## 2017-12-10 NOTE — Patient Instructions (Signed)
1.  We will increase escitalopram to 20mg  at bedtime 2.  Continue donepezil 10mg  at bedtime 3.  Follow up for neurocognitive testing and then with me afterward.

## 2017-12-13 ENCOUNTER — Ambulatory Visit: Payer: PPO | Admitting: Family Medicine

## 2017-12-13 ENCOUNTER — Ambulatory Visit (INDEPENDENT_AMBULATORY_CARE_PROVIDER_SITE_OTHER)
Admission: RE | Admit: 2017-12-13 | Discharge: 2017-12-13 | Disposition: A | Discharge status: Home / Self Care | Payer: PPO | Source: Ambulatory Visit | Attending: Family Medicine | Admitting: Family Medicine

## 2017-12-13 ENCOUNTER — Ambulatory Visit: Payer: Self-pay

## 2017-12-13 ENCOUNTER — Encounter: Payer: Self-pay | Admitting: Family Medicine

## 2017-12-13 VITALS — BP 144/90 | HR 69 | Ht 70.0 in | Wt 209.0 lb

## 2017-12-13 DIAGNOSIS — M1611 Unilateral primary osteoarthritis, right hip: Secondary | ICD-10-CM | POA: Diagnosis not present

## 2017-12-13 DIAGNOSIS — M25551 Pain in right hip: Secondary | ICD-10-CM

## 2017-12-13 DIAGNOSIS — M7061 Trochanteric bursitis, right hip: Secondary | ICD-10-CM

## 2017-12-13 DIAGNOSIS — M47816 Spondylosis without myelopathy or radiculopathy, lumbar region: Secondary | ICD-10-CM | POA: Diagnosis not present

## 2017-12-13 NOTE — Assessment & Plan Note (Signed)
Right-sided injected today.  Encourage patient to take the gabapentin on a regular basis in case this is more of a lumbar radiculopathy but did not appear so on exam today.  Patient did have near complete resolution of pain immediately.  We discussed icing regimen.  Patient declined formal physical therapy.  Patient will come back and see me again in 4 weeks

## 2017-12-13 NOTE — Patient Instructions (Signed)
Good to see you  Ice 20 minutes 2 times daily. Usually after activity and before bed. Exercises 3 times a week.  Stay active If not better see me again in 3 weeks and we will need to look at your back

## 2017-12-13 NOTE — Progress Notes (Signed)
[name] [place] [address] [place], [place] [address] Phone: [phone] Subjective:     CC: Right hip pain  TOI:[id number]  Dean Pruitt is a 84 y.o. male coming in with complaint of right hip pain.  Patient has been seen previously and does have actually a greater trochanteric injection that has responded very well.  Patient does last seen by another provider.  Was given injection with no significant benefit approximately 6 weeks ago.  Still having a significant amount of pain.  Patient states that it is severe.  Waking him up at night.  Patient is doing more activity now because unfortunately his wife died.  Feels that that could be exacerbating the problem.  Still being treated by oncology    Past Medical History:  Diagnosis Date  . Cataract   . Depression    situational  . GERD (gastroesophageal reflux disease)   . Goals of care, counseling/discussion 03/20/2017  . Hyperlipidemia   . Hypothyroidism   . Reactive depression 03/20/2017  . Thyroid cancer ([place])    PMH of; on supressive therapy   Past Surgical History:  Procedure Laterality Date  . CATARACT EXTRACTION, BILATERAL  2013 & 2014   Dr Celene Squibb  . colon polyps  2002 & 2005   negative 2008; Dr Earlean Shawl  . COLONOSCOPY W/ POLYPECTOMY    . LUNG BIOPSY Left 02/15/2017   Procedure: LUNG BIOPSY;  Surgeon: Melrose Nakayama, MD;  Location: [facility];  Service: Thoracic;  Laterality: Left;  . PLEURAL EFFUSION DRAINAGE Left 02/15/2017   Procedure: DRAINAGE OF PLEURAL EFFUSION;  Surgeon: Melrose Nakayama, MD;  Location: [facility];  Service: Thoracic;  Laterality: Left;  . PORTACATH PLACEMENT N/A 03/25/2017   Procedure: INSERTION PORT-A-CATH, right internal jugular;  Surgeon: Melrose Nakayama, MD;  Location: [facility];  Service: Thoracic;  Laterality: N/A;  . THYROIDECTOMY  2001   S/P RAI  . VIDEO ASSISTED THORACOSCOPY Left 02/15/2017   Procedure: VIDEO ASSISTED THORACOSCOPY;  Surgeon: Melrose Nakayama, MD;  Location: [facility];  Service: Thoracic;  Laterality: Left;   Social History   Socioeconomic History  . Marital status: Married    Spouse name: None  . Number of children: 2  . Years of education: None  . Highest education level: None  Social Needs  . Financial resource strain: None  . Food insecurity - worry: None  . Food insecurity - inability: None  . Transportation needs - medical: None  . Transportation needs - non-medical: None  Occupational History  . Occupation: Retired  Tobacco Use  . Smoking status: Former Smoker    Packs/day: 1.00    Years: 5.00    Pack years: 5.00    Types: Cigarettes    Last attempt to quit: 12/07/1958    Years since quitting: 59.0  . Smokeless tobacco: Never Used  . Tobacco comment: smoked 1956-1960, up 1 ppd  Substance and Sexual Activity  . Alcohol use: Yes    Alcohol/week: 8.4 oz    Types: 14 Glasses of wine per week  . Drug use: No  . Sexual activity: None  Other Topics Concern  . None  Social History Narrative   Exercise:  Yard work, walks on occasion               Allergies  Allergen Reactions  . Ativan [Lorazepam] Other (See Comments)    Mental status change-"went crazy in the head"  . Tramadol Other (See Comments)  Altered mental status "crazy in the head'  . Fentanyl Other (See Comments)    hallucinations   Family History  Problem Relation Age of Onset  . Alzheimer's disease Mother   . Hypertension Mother   . Heart disease Father        CAD and angioplasty in 53s  . Cancer Father        Bladder  . Other Brother        valvular heart disease  . Diabetes Neg Hx   . Stroke Neg Hx      Past medical history, social, surgical and family history all reviewed in electronic medical record.  No pertanent information unless stated regarding to the chief complaint.   Review of Systems:Review of systems updated and as accurate as of 12/13/17  No headache, visual changes, nausea, vomiting, diarrhea,  constipation, dizziness, abdominal pain, skin rash, fevers, chills, night sweats, weight loss, swollen lymph nodes, body aches, joint swelling, , chest pain, shortness of breath, mood changes.  Positive muscle aches  Objective  Blood pressure (!) 144/90, pulse 69, height 5\' 10"  (1.778 m), weight 209 lb (94.8 kg), SpO2 93 %. Systems examined below as of 12/13/17   General: No apparent distress alert and oriented x3 mood and affect normal, dressed appropriately.  HEENT: Pupils equal, extraocular movements intact  Respiratory: Patient's speak in full sentences and does not appear short of breath  Cardiovascular: No lower extremity edema, non tender, no erythema  Skin: Warm dry intact with no signs of infection or rash on extremities or on axial skeleton.  Abdomen: Soft nontender  Neuro: Cranial nerves II through XII are intact, neurovascularly intact in all extremities with 2+ DTRs and 2+ pulses.  Lymph: No lymphadenopathy of posterior or anterior cervical chain or axillae bilaterally.  Gait normal with good balance and coordination.  MSK:  Non tender with full range of motion and good stability and symmetric strength and tone of shoulders, elbows, wrist, , knee and ankles bilaterally.  Right hip exam shows significant tenderness over the lateral aspect of the hip.  Patient does have some mild limitation in range of motion patient does have a positive Corky Sox.  Negative straight leg test today.  Back exam does show some mild discomfort and pain.   Procedure: Real-time Ultrasound Guided Injection of right greater trochanteric bursitis secondary to patient's body habitus Device: GE Logiq Q7 Ultrasound guided injection is preferred based studies that show increased duration, increased effect, greater accuracy, decreased procedural pain, increased response rate, and decreased cost with ultrasound guided versus blind injection.  Verbal informed consent obtained.  Time-out conducted.  Noted no overlying  erythema, induration, or other signs of local infection.  Skin prepped in a sterile fashion.  Local anesthesia: Topical Ethyl chloride.  With sterile technique and under real time ultrasound guidance:  Greater trochanteric area was visualized and patient's bursa was noted. A 22-gauge 3 inch needle was inserted and 4 cc of 0.5% Marcaine and 1 cc of Kenalog 40 mg/dL was injected. Pictures taken Completed without difficulty  Pain immediately resolved suggesting accurate placement of the medication.  Advised to call if fevers/chills, erythema, induration, drainage, or persistent bleeding.  Images permanently stored and available for review in the ultrasound unit.  Impression: Technically successful ultrasound guided injection.    Impression and Recommendations:     This case required medical decision making of moderate complexity.      Note: This dictation was prepared with Dragon dictation along with smaller phrase technology. Any  transcriptional errors that result from this process are unintentional.

## 2017-12-14 ENCOUNTER — Other Ambulatory Visit: Payer: Self-pay

## 2017-12-14 ENCOUNTER — Other Ambulatory Visit: Payer: Self-pay | Admitting: *Deleted

## 2017-12-14 ENCOUNTER — Encounter: Payer: Self-pay | Admitting: Thoracic Surgery (Cardiothoracic Vascular Surgery)

## 2017-12-14 ENCOUNTER — Institutional Professional Consult (permissible substitution): Payer: PPO | Admitting: Thoracic Surgery (Cardiothoracic Vascular Surgery)

## 2017-12-14 VITALS — BP 119/77 | HR 78 | Resp 18 | Ht 70.0 in | Wt 206.0 lb

## 2017-12-14 DIAGNOSIS — I714 Abdominal aortic aneurysm, without rupture, unspecified: Secondary | ICD-10-CM

## 2017-12-14 DIAGNOSIS — I712 Thoracic aortic aneurysm, without rupture, unspecified: Secondary | ICD-10-CM

## 2017-12-14 DIAGNOSIS — C450 Mesothelioma of pleura: Secondary | ICD-10-CM | POA: Diagnosis not present

## 2017-12-14 NOTE — Progress Notes (Signed)
[address]       [place] [address]             [phone]     HPI: Dean Pruitt is sent for evaluation of an ascending aneurysm  Dean Pruitt is an 84 year old man with a past medical history of thyroid cancer in 2001 treated with thyroidectomy and radioactive iodine, a 4.2 cm ascending aneurysm, arthritis, cataracts, hyperlipidemia, early dementia and stage II malignant mesothelioma.  In November 2017 he developed tightness in his chest when taking a deep breath.  He was found to have a pleural effusion.  He had multiple large volume thoracenteses.  Cytologies were negative.  I did a left VATS to drain the effusion and a parietal pleurectomy and partial visceral pleurectomy.  He turned out to have stage II malignant mesothelioma.  He was treated with 6 cycles of chemotherapy with carboplatin, Alimta, and Avastin.  He saw Dr. Julien Pruitt in early 11/29/23 and had no evidence of recurrent disease.   A CT in late November showed an ascending aneurysm which measured 4.5 cm per the radiologist.  He has been feeling poorly lately with depression and decreased energy after his wife passed away in 11-29-23.  He has discomfort in the lower sternal area when he takes a deep breath.  Otherwise he denies chest pain.  Past Medical History:  Diagnosis Date  . Cataract   . Depression    situational  . GERD (gastroesophageal reflux disease)   . Goals of care, counseling/discussion 03/20/2017  . Hyperlipidemia   . Hypothyroidism   . Reactive depression 03/20/2017  . Thyroid cancer [facility])    PMH of; on supressive therapy    Current Outpatient Medications  Medication Sig Dispense Refill  . aspirin 81 MG tablet Take 81 mg by mouth daily.    Marland Kitchen donepezil (ARICEPT) 10 MG tablet Take 1 tablet (10 mg total) by mouth at bedtime. 30 tablet 2  . escitalopram (LEXAPRO) 20 MG tablet Take 1 tablet (20 mg total) by mouth at bedtime. 30 tablet 2  . gabapentin (NEURONTIN) 300 MG capsule TAKE  ONE CAPSULE BY MOUTH AT NIGHT 90 capsule 3  . levothyroxine (SYNTHROID, LEVOTHROID) 150 MCG tablet TAKE 1 TABLET BY MOUTH ONCE DAILY MON- FRI AND 1/2 TABLET ON SAT & SUN 90 tablet 1  . lidocaine-prilocaine (EMLA) cream Apply 1 application topically as needed. (Patient taking differently: Apply 1 application topically as needed (port access). ) 30 g 0  . omeprazole (PRILOSEC) 20 MG capsule Take 1 capsule (20 mg total) by mouth daily. -- Office visit needed for further refills 30 capsule 3  . oxyCODONE (OXY IR/ROXICODONE) 5 MG immediate release tablet Take 1 tablet (5 mg total) by mouth every 6 (six) hours as needed for severe pain. 30 tablet 0  . Probiotic Product (PROBIOTIC PO) Take 1 tablet by mouth 2 (two) times daily.     . rosuvastatin (CRESTOR) 20 MG tablet Take 1 tablet (20 mg total) by mouth daily. 90 tablet 3   No current facility-administered medications for this visit.     Physical Exam BP 119/77 (BP Location: Left Arm, Patient Position: Sitting, Cuff Size: Large)   Pulse 78   Resp 18   Ht 5\' 10"  (1.778 m)   Wt 206 lb (93.4 kg)   SpO2 95% Comment: RA  BMI 29.32 kg/m  84 year old man in no acute distress Depressed affect No carotid bruits, no cervical or subclavicular adenopathy Cardiac regular rate and  rhythm normal S1 and S2 Lungs clear with equal breath sounds bilaterally  Diagnostic Tests: CT CHEST, ABDOMEN, AND PELVIS WITH CONTRAST  TECHNIQUE: Multidetector CT imaging of the chest, abdomen and pelvis was performed following the standard protocol during bolus administration of intravenous contrast.  CONTRAST:  173mL ISOVUE-300 IOPAMIDOL (ISOVUE-300) INJECTION 61%  COMPARISON:  Multiple exams, including 11/01/2017 and PET-CT from 03/17/2017  FINDINGS: CT CHEST FINDINGS  Cardiovascular: Coronary, aortic arch, and branch vessel atherosclerotic vascular disease. Moderate cardiomegaly. Ascending thoracic aorta 4.5 cm in diameter. Right Port-A-Cath tip:  SVC.  Mediastinum/Nodes: Right lower paratracheal node at the level of the carina measures 10 mm in short axis, previously 11 mm on 03/17/2017.  Lungs/Pleura: Trace right pleural effusion with bandlike density tracking in the right lower lobe mildly increased from 11/01/2017 but favoring atelectasis given the associated volume loss.  Fluid and mild nodularity in the left major fissure reduced from 03/17/2017 but stable from 11/01/2017. Volume loss and likely adjacent pleural thickening in the left lower lobe adjacent to the descending thoracic aorta, but improved from 03/17/2017. Are over the posterior pleural thickening shown on the 03/17/2017 PET-CT is improved and the paramediastinal pleural thickening has essentially resolved compared April. Overall the appearance suggests significant interval improvement from with regard to the mesothelioma.  Musculoskeletal: Unremarkable  CT ABDOMEN PELVIS FINDINGS  Hepatobiliary: Unremarkable  Pancreas: Unremarkable  Spleen: Unremarkable  Adrenals/Urinary Tract: Nonobstructive 2 mm left kidney lower pole calculus. Questionable 1 mm right mid to lower kidney calculus on image 85/4. Adrenal glands normal. Bladder unremarkable.  Stomach/Bowel: Sigmoid diverticulosis.  Vascular/Lymphatic: Aortoiliac atherosclerotic vascular disease. Infrarenal abdominal aortic aneurysm 3.4 cm transverse on image 84/2. Mild saccular aneurysmal dilatation of the right common iliac artery with some mural thrombus, diameter 2.6 cm. No pathologic adenopathy in the abdomen or pelvis identified.  Reproductive: Prostatomegaly, the gland measures 5.9 by 4.3 by 5.2 cm (volume = 69 cm^3).  Other: No supplemental non-categorized findings.  Musculoskeletal: Fatty spermatic cords probably from indirect inguinal hernias containing adipose tissue. Lower lumbar degenerative facet arthropathy with mild left foraminal stenosis  at L5-S1.  IMPRESSION: 1. Although not appreciably changed from 3 days ago, the pleural thickening in the left hemithorax has significantly improved compared to the PET-CT from 03/17/2017, in the paramediastinal component has resolved. Residual pleural thickening noted along the major fissure and adjacent to the descending thoracic aorta, with trace pleural fluid. 2. Mildly worsened atelectasis in the right lower lobe with a trace right pleural effusion. 3. Similar appearance of ascending thoracic aorta, infrarenal abdominal aortic aneurysm, and a slightly saccular aneurysm of the right common iliac artery. Prior vascular follow up recommendations from 11/01/2017 are reaffirmed. 4. Borderline enlarged right lower paratracheal lymph node at the level of the carina, although reduced in size from 03/17/2017. 5. Other imaging findings of potential clinical significance: Nonobstructive bilateral nephrolithiasis. Sigmoid diverticulosis. Aortic Atherosclerosis (ICD10-I70.0). Prostatomegaly. Small bilateral indirect inguinal hernias containing adipose tissue. Mild left foraminal impingement at L5-S1. Moderate cardiomegaly.   Electronically Signed   By: Van Clines M.D.   On: 11/05/2017 09:14 I personally reviewed the CT chest and concur with the findings noted above  Impression: Dean Pruitt is an 84 year old man with history of thyroid cancer, malignant mesothelioma, reflux, situational depression, and an ascending aortic aneurysm in the setting of aortic atherosclerosis.  Ascending aneurysm/aortic atherosclerosis-no indication for surgery at this time.  His aorta really is only mildly enlarged for a man his age.  He will need continued follow-up on an annual  basis.  He will be getting scanned much more frequently than that due to his malignant mesothelioma follow-up.  Malignant mesothelioma-status post pleurectomy and chemotherapy.  I am very pleased with the appearance of his  most recent CT.  He is being followed by Dr. Julien Pruitt  Plan: I will see him back in 1 year and review his most recent CT for evaluation of his ascending aneurysm  Dean Nakayama, MD [facility] [phone]

## 2017-12-20 ENCOUNTER — Inpatient Hospital Stay: Payer: PPO | Attending: Internal Medicine

## 2017-12-20 DIAGNOSIS — Z452 Encounter for adjustment and management of vascular access device: Secondary | ICD-10-CM | POA: Diagnosis not present

## 2017-12-20 DIAGNOSIS — C450 Mesothelioma of pleura: Secondary | ICD-10-CM | POA: Diagnosis not present

## 2017-12-20 DIAGNOSIS — Z95828 Presence of other vascular implants and grafts: Secondary | ICD-10-CM

## 2017-12-20 MED ORDER — HEPARIN SOD (PORK) LOCK FLUSH 100 UNIT/ML IV SOLN
500.0000 [IU] | Freq: Once | INTRAVENOUS | Status: AC
  Administered 2017-12-20: 500 [IU]
  Filled 2017-12-20: qty 5

## 2017-12-20 MED ORDER — SODIUM CHLORIDE 0.9% FLUSH
10.0000 mL | Freq: Once | INTRAVENOUS | Status: AC
  Administered 2017-12-20: 10 mL
  Filled 2017-12-20: qty 10

## 2017-12-21 ENCOUNTER — Encounter: Payer: Self-pay | Admitting: Internal Medicine

## 2017-12-21 ENCOUNTER — Ambulatory Visit (INDEPENDENT_AMBULATORY_CARE_PROVIDER_SITE_OTHER): Payer: PPO | Admitting: Internal Medicine

## 2017-12-21 VITALS — BP 96/72 | HR 82 | Temp 97.3°F | Resp 16 | Wt 206.0 lb

## 2017-12-21 DIAGNOSIS — H9201 Otalgia, right ear: Secondary | ICD-10-CM | POA: Diagnosis not present

## 2017-12-21 NOTE — Patient Instructions (Addendum)
Take ibuprofen or tylenol if needed for the ear pain.    There is no evidence of an infection.  This may be pain from the jaw.   A referral was ordered for ENT.

## 2017-12-21 NOTE — Progress Notes (Signed)
Subjective:    Patient ID: Dean Pruitt, male    DOB: 02/25/1934, 84 y.o.   MRN: [id number]  HPI He is here for an acute visit for ear symptoms.  His symptoms started a while ago-likely several months ago.  He does have some memory difficulties so his history is difficult to obtain.  He is here with his daughter who supplements..  He is experiencing right ear pain - it feels like a tooth ache.  He denies pain today, but last night it did hurt.  It is intermittent.Marland Kitchen  He is unsure when the pain comes.  The pain lasts a while and then goes away on its own.  His daughter mentioned some hoarseness.  He denies other symptoms.  He went to urgent care about two weeks ago 12/06/17.    He has tried taking prednisone pak which helped.  Once he stopped the pain returned.  Last night when the pain did hurt he felt lying on that ear made it better.  He denies any increased pain with chewing or opening his mouth.  He denies hearing loss or other cold symptoms.   Medications and allergies reviewed with patient and updated if appropriate.  Patient Active Problem List   Diagnosis Date Noted  . Bursitis of hip 11/17/2017  . Bilateral low back pain without sciatica 11/17/2017  . Thoracic aortic aneurysm without rupture ([place]) 11/09/2017  . Muscular chest pain 11/09/2017  . Recurrent falls 11/09/2017  . Hypothyroidism 10/13/2017  . B12 deficiency 10/13/2017  . Change in stool 10/13/2017  . Port catheter in place 06/02/2017  . Degenerative arthritis of left knee 03/24/2017  . Goals of care, counseling/discussion 03/20/2017  . Encounter for antineoplastic chemotherapy 03/20/2017  . Reactive depression 03/20/2017  . Malignant mesothelioma of pleura ([place]) 02/19/2017  . History of lung biopsy 02/17/2017  . Pleural effusion 02/15/2017  . Greater trochanteric bursitis of left hip 12/31/2016  . Pleural effusion, left 10/27/2016  . infrarenal abdominal aortic aneurysm without rupture ([place]) - [place]  needed 2020 10/22/2016  . Left lumbar radiculopathy 10/12/2016  . Bursitis of left hip 09/30/2016  . Left foot drop 09/30/2016  . Toe pain, left 08/13/2016  . Myalgia 07/04/2016  . Mild cognitive impairment 07/03/2016  . Adjustment disorder with depressed mood 07/03/2016  . Frequency of urination 09/26/2014  . Macular degeneration 06/22/2011  . Fasting hyperglycemia 05/20/2009  . NONSPECIFIC ABNORMAL ELECTROCARDIOGRAM 05/20/2009  . THYROID CANCER, HX OF 05/20/2009  . History of colonic polyps 05/20/2009  . Hyperlipidemia 11/21/2007    Current Outpatient Medications on File Prior to Visit  Medication Sig Dispense Refill  . aspirin 81 MG tablet Take 81 mg by mouth daily.    Marland Kitchen donepezil (ARICEPT) 10 MG tablet Take 1 tablet (10 mg total) by mouth at bedtime. 30 tablet 2  . escitalopram (LEXAPRO) 20 MG tablet Take 1 tablet (20 mg total) by mouth at bedtime. 30 tablet 2  . gabapentin (NEURONTIN) 300 MG capsule TAKE ONE CAPSULE BY MOUTH AT NIGHT 90 capsule 3  . levothyroxine (SYNTHROID, LEVOTHROID) 150 MCG tablet TAKE 1 TABLET BY MOUTH ONCE DAILY MON- FRI AND 1/2 TABLET ON SAT & SUN 90 tablet 1  . lidocaine-prilocaine (EMLA) cream Apply 1 application topically as needed. (Patient taking differently: Apply 1 application topically as needed (port access). ) 30 g 0  . omeprazole (PRILOSEC) 20 MG capsule Take 1 capsule (20 mg total) by mouth daily. -- Office visit needed for further refills 30 capsule  3  . oxyCODONE (OXY IR/ROXICODONE) 5 MG immediate release tablet Take 1 tablet (5 mg total) by mouth every 6 (six) hours as needed for severe pain. 30 tablet 0  . Probiotic Product (PROBIOTIC PO) Take 1 tablet by mouth 2 (two) times daily.     . rosuvastatin (CRESTOR) 20 MG tablet Take 1 tablet (20 mg total) by mouth daily. 90 tablet 3   No current facility-administered medications on file prior to visit.     Past Medical History:  Diagnosis Date  . Cataract   . Depression    situational  .  GERD (gastroesophageal reflux disease)   . Goals of care, counseling/discussion 03/20/2017  . Hyperlipidemia   . Hypothyroidism   . Reactive depression 03/20/2017  . Thyroid cancer ([place])    PMH of; on supressive therapy    Past Surgical History:  Procedure Laterality Date  . CATARACT EXTRACTION, BILATERAL  2013 & 2014   Dr Celene Squibb  . colon polyps  2002 & 2005   negative 2008; Dr Earlean Shawl  . COLONOSCOPY W/ POLYPECTOMY    . LUNG BIOPSY Left 02/15/2017   Procedure: LUNG BIOPSY;  Surgeon: Melrose Nakayama, MD;  Location: [facility];  Service: Thoracic;  Laterality: Left;  . PLEURAL EFFUSION DRAINAGE Left 02/15/2017   Procedure: DRAINAGE OF PLEURAL EFFUSION;  Surgeon: Melrose Nakayama, MD;  Location: [facility];  Service: Thoracic;  Laterality: Left;  . PORTACATH PLACEMENT N/A 03/25/2017   Procedure: INSERTION PORT-A-CATH, right internal jugular;  Surgeon: Melrose Nakayama, MD;  Location: [facility];  Service: Thoracic;  Laterality: N/A;  . THYROIDECTOMY  2001   S/P RAI  . VIDEO ASSISTED THORACOSCOPY Left 02/15/2017   Procedure: VIDEO ASSISTED THORACOSCOPY;  Surgeon: Melrose Nakayama, MD;  Location: [facility];  Service: Thoracic;  Laterality: Left;    Social History   Socioeconomic History  . Marital status: Married    Spouse name: None  . Number of children: 2  . Years of education: None  . Highest education level: None  Social Needs  . Financial resource strain: None  . Food insecurity - worry: None  . Food insecurity - inability: None  . Transportation needs - medical: None  . Transportation needs - non-medical: None  Occupational History  . Occupation: Retired  Tobacco Use  . Smoking status: Former Smoker    Packs/day: 1.00    Years: 5.00    Pack years: 5.00    Types: Cigarettes    Last attempt to quit: 12/07/1958    Years since quitting: 59.0  . Smokeless tobacco: Never Used  . Tobacco comment: smoked 1956-1960, up 1 ppd  Substance and Sexual Activity  . Alcohol use: Yes     Alcohol/week: 8.4 oz    Types: 14 Glasses of wine per week  . Drug use: No  . Sexual activity: None  Other Topics Concern  . None  Social History Narrative   Exercise:  Yard work, walks on occasion                Family History  Problem Relation Age of Onset  . Alzheimer's disease Mother   . Hypertension Mother   . Heart disease Father        CAD and angioplasty in 2s  . Cancer Father        Bladder  . Other Brother        valvular heart disease  . Diabetes Neg Hx   . Stroke Neg Hx  Review of Systems  Constitutional: Negative for chills and fever.  HENT: Positive for ear pain (right ear) and voice change. Negative for congestion, hearing loss, sinus pressure and sore throat.   Respiratory: Negative for cough, shortness of breath and wheezing.   Neurological: Negative for headaches.       Objective:   Vitals:   12/21/17 1541  BP: 96/72  Pulse: 82  Resp: 16  Temp: (!) 97.3 F (36.3 C)  SpO2: 97%   Filed Weights   12/21/17 1541  Weight: 206 lb (93.4 kg)   Body mass index is 29.56 kg/m.  Wt Readings from Last 3 Encounters:  12/21/17 206 lb (93.4 kg)  12/14/17 206 lb (93.4 kg)  12/13/17 209 lb (94.8 kg)     Physical Exam GENERAL APPEARANCE: Appears stated age, well appearing, NAD EYES: conjunctiva clear, no icterus HEENT: bilateral tympanic membranes and ear canals normal, right ear pinna without swelling or pain, no TMJ tenderness, no increased pain with opening mouth, oropharynx with no erythema, no thyromegaly, trachea midline, no cervical or supraclavicular lymphadenopathy LUNGS: Clear to auscultation without wheeze or crackles, unlabored breathing, good air entry bilaterally CARDIOVASCULAR: Normal S1,S2 without murmurs, no edema SKIN: warm, dry        Assessment & Plan:   See Problem List for Assessment and Plan of chronic medical problems.

## 2017-12-21 NOTE — Assessment & Plan Note (Signed)
Started several months ago and is intermittent without obvious pattern He had pain last night, but none currently Exam is normal Was seen at urgent care and was prescribed prednisone, which did seem to help No evidence of infection Possible TMJ Despite having no pain now he is insistent that the pain comes and goes and is concerned about the cause-we will refer to ENT Also advised seeing his dentist-he has an appointment in a couple of months Advised ibuprofen or Tylenol as needed

## 2017-12-27 ENCOUNTER — Telehealth: Payer: Self-pay | Admitting: Emergency Medicine

## 2017-12-27 NOTE — Telephone Encounter (Signed)
Spoke with pts daughter to advise that Cecille Rubin is currently working on the referral and someone would contact her as soon as it is ready to schedule.

## 2017-12-27 NOTE — Telephone Encounter (Signed)
Copied from [place] [id number]. Topic: Referral - Status >> Dec 27, 2017  3:02 PM Conception Chancy, NT wrote: Reason for CRM: patient daughter Cecille Rubin) is calling and states pt was seen 12/21/17 and is supposed to be referred to a ENT. She is calling to check on the status of that.

## 2017-12-28 DIAGNOSIS — H9201 Otalgia, right ear: Secondary | ICD-10-CM | POA: Diagnosis not present

## 2017-12-29 ENCOUNTER — Ambulatory Visit: Payer: PPO | Admitting: Neurology

## 2017-12-29 NOTE — Progress Notes (Signed)
[facility] [name],  [address] Phone: [phone] Subjective:    I'm seeing this patient by the request  of:    CC: Hip and back pain follow-up  HBZ:[id number]  Dean Pruitt is a 84 y.o. male coming in with complaint of back pain follow-up.  Patient was seen previously and was having more of a greater trochanteric bursitis as well as a distal gluteal tendon tendinitis.  Given an injection December 13, 2017.  Patient states that he is in constant pain. The injection that he received lasted on day on the right side. The injection on the left has eliminated his pain.  Still having pain on the right side.  States that it can wake him up at night.  Denies though any true weakness.     Past Medical History:  Diagnosis Date  . Cataract   . Depression    situational  . GERD (gastroesophageal reflux disease)   . Goals of care, counseling/discussion 03/20/2017  . Hyperlipidemia   . Hypothyroidism   . Reactive depression 03/20/2017  . Thyroid cancer ([place])    PMH of; on supressive therapy   Past Surgical History:  Procedure Laterality Date  . CATARACT EXTRACTION, BILATERAL  2013 & 2014   Dr Celene Squibb  . colon polyps  2002 & 2005   negative 2008; Dr Earlean Shawl  . COLONOSCOPY W/ POLYPECTOMY    . LUNG BIOPSY Left 02/15/2017   Procedure: LUNG BIOPSY;  Surgeon: Melrose Nakayama, MD;  Location: [facility];  Service: Thoracic;  Laterality: Left;  . PLEURAL EFFUSION DRAINAGE Left 02/15/2017   Procedure: DRAINAGE OF PLEURAL EFFUSION;  Surgeon: Melrose Nakayama, MD;  Location: [facility];  Service: Thoracic;  Laterality: Left;  . PORTACATH PLACEMENT N/A 03/25/2017   Procedure: INSERTION PORT-A-CATH, right internal jugular;  Surgeon: Melrose Nakayama, MD;  Location: [facility];  Service: Thoracic;  Laterality: N/A;  . THYROIDECTOMY  2001   S/P RAI  . VIDEO ASSISTED THORACOSCOPY Left 02/15/2017   Procedure: VIDEO ASSISTED THORACOSCOPY;  Surgeon: Melrose Nakayama, MD;  Location: [facility];  Service: Thoracic;  Laterality: Left;   Social History   Socioeconomic History  . Marital status: Married    Spouse name: None  . Number of children: 2  . Years of education: None  . Highest education level: None  Social Needs  . Financial resource strain: None  . Food insecurity - worry: None  . Food insecurity - inability: None  . Transportation needs - medical: None  . Transportation needs - non-medical: None  Occupational History  . Occupation: Retired  Tobacco Use  . Smoking status: Former Smoker    Packs/day: 1.00    Years: 5.00    Pack years: 5.00    Types: Cigarettes    Last attempt to quit: 12/07/1958    Years since quitting: 59.1  . Smokeless tobacco: Never Used  . Tobacco comment: smoked 1956-1960, up 1 ppd  Substance and Sexual Activity  . Alcohol use: Yes    Alcohol/week: 8.4 oz    Types: 14 Glasses of wine per week  . Drug use: No  . Sexual activity: None  Other Topics Concern  . None  Social History Narrative   Exercise:  Yard work, walks on occasion               Allergies  Allergen Reactions  . Ativan [Lorazepam] Other (See Comments)    Mental status change-"went crazy in  the head"  . Tramadol Other (See Comments)    Altered mental status "crazy in the head'  . Fentanyl Other (See Comments)    hallucinations   Family History  Problem Relation Age of Onset  . Alzheimer's disease Mother   . Hypertension Mother   . Heart disease Father        CAD and angioplasty in 69s  . Cancer Father        Bladder  . Other Brother        valvular heart disease  . Diabetes Neg Hx   . Stroke Neg Hx      Past medical history, social, surgical and family history all reviewed in electronic medical record.  No pertanent information unless stated regarding to the chief complaint.   Review of Systems:Review of systems updated and as accurate as of 12/30/17  No headache, visual changes, nausea, vomiting, diarrhea,  constipation, dizziness, abdominal pain, skin rash, fevers, chills, night sweats, weight loss, swollen lymph nodes, body aches, joint swelling, , chest pain, shortness of breath, mood changes.  Positive muscle aches does have history of dementia  Objective  Blood pressure 118/82, pulse 68, height 5\' 10"  (1.778 m), weight 205 lb (93 kg), SpO2 92 %. Systems examined below as of 12/30/17   General: No apparent distress alert and oriented x3 mood and affect normal, dressed appropriately.  HEENT: Pupils equal, extraocular movements intact  Respiratory: Patient's speak in full sentences and does not appear short of breath  Cardiovascular: No lower extremity edema, non tender, no erythema  Skin: Warm dry intact with no signs of infection or rash on extremities or on axial skeleton.  Abdomen: Soft nontender  Neuro: Cranial nerves II through XII are intact, neurovascularly intact in all extremities with 2+ DTRs and 2+ pulses.  Lymph: No lymphadenopathy of posterior or anterior cervical chain or axillae bilaterally.  Gait normal with good balance and coordination.  MSK:  Non tender with full range of motion and good stability and symmetric strength and tone of shoulders, elbows, wrist, , knee and ankles bilaterally.  Back exam shows some moderate tenderness to palpation in the paraspinal musculature lumbar spine right greater than left.  Tightness with straight leg test.  Patient denies any radicular symptoms.  Still has some tenderness over the lateral aspect of the hip.  Strength 4+ out of 5 compared to the contralateral side.  Neurovascularly intact distally    Impression and Recommendations:     This case required medical decision making of moderate complexity.      Note: This dictation was prepared with Dragon dictation along with smaller phrase technology. Any transcriptional errors that result from this process are unintentional.

## 2017-12-30 ENCOUNTER — Telehealth: Payer: Self-pay | Admitting: Family Medicine

## 2017-12-30 ENCOUNTER — Ambulatory Visit (INDEPENDENT_AMBULATORY_CARE_PROVIDER_SITE_OTHER): Payer: PPO | Admitting: Family Medicine

## 2017-12-30 ENCOUNTER — Encounter: Payer: Self-pay | Admitting: Family Medicine

## 2017-12-30 VITALS — BP 118/82 | HR 68 | Ht 70.0 in | Wt 205.0 lb

## 2017-12-30 DIAGNOSIS — M545 Low back pain, unspecified: Secondary | ICD-10-CM

## 2017-12-30 DIAGNOSIS — M549 Dorsalgia, unspecified: Secondary | ICD-10-CM

## 2017-12-30 MED ORDER — MELOXICAM 7.5 MG PO TABS: 7.5000 mg | ORAL_TABLET | Freq: Every day | ORAL | 3 refills | Status: DC

## 2017-12-30 NOTE — Patient Instructions (Signed)
Good to see you  Meloxicam daily for next month If it hurts your stomach you can stop  Try physical therapy for next month Worsening pain we will need to consider MRI and possible injections See me again in 4 weeks

## 2017-12-30 NOTE — Assessment & Plan Note (Signed)
Patient does have low back pain and seems to have more of a radicular symptoms going down the right leg.  With patient's history of cancer this likely is somewhat concerning and we could do advanced imaging.  Patient declined that at this time.  Wants to go with formal physical therapy.  Will be referred today.  We discussed worsening symptoms to come back sooner.  Patient otherwise will follow up with me again in 4 weeks.  Continue the gabapentin.

## 2017-12-30 NOTE — Telephone Encounter (Signed)
Copied from [place] [phone]. Topic: General - Other >> Dec 30, 2017  4:42 PM Cecelia Byars, NT wrote: Reason for CRM: [facility] called and need to find out if a triple a screening is being done ,they need to find out if he meets the requirements please call    [phone] ,they are waiting for this information to schedule

## 2018-01-03 ENCOUNTER — Other Ambulatory Visit: Payer: Self-pay | Admitting: Internal Medicine

## 2018-01-03 NOTE — Telephone Encounter (Signed)
Pt saw Dr. Roxan Hockey for this & was told he will be watching it. Order cancelled.

## 2018-01-04 ENCOUNTER — Telehealth: Payer: Self-pay | Admitting: Family Medicine

## 2018-01-04 DIAGNOSIS — M4807 Spinal stenosis, lumbosacral region: Secondary | ICD-10-CM

## 2018-01-04 NOTE — Telephone Encounter (Signed)
Copied from [place] [phone]. Topic: Quick Communication - See Telephone Encounter >> Jan 04, 2018  1:16 PM Aurelio Brash B wrote: CRM for notification. See Telephone encounter for:  PTs daughter Cecille Rubin  called because   she is confused about ABD U/S  that was set up for pt at [facility].  She thought that the  AAA had already been adressed and would like a call to discuss  01/04/18.

## 2018-01-04 NOTE — Telephone Encounter (Signed)
Spoke to pt's daughter. Advised her that she is correct the order for the AAA u/s has been cancelled. Pts daughter did state that they would like to go ahead & order the MRI.  MRI ordered & sent to [place].

## 2018-01-07 ENCOUNTER — Ambulatory Visit: Payer: Self-pay | Admitting: *Deleted

## 2018-01-07 ENCOUNTER — Ambulatory Visit (INDEPENDENT_AMBULATORY_CARE_PROVIDER_SITE_OTHER): Payer: PPO | Admitting: Family Medicine

## 2018-01-07 ENCOUNTER — Encounter: Payer: Self-pay | Admitting: Family Medicine

## 2018-01-07 VITALS — BP 118/68 | HR 83 | Temp 98.3°F | Ht 70.0 in | Wt 205.0 lb

## 2018-01-07 DIAGNOSIS — H5712 Ocular pain, left eye: Secondary | ICD-10-CM | POA: Diagnosis not present

## 2018-01-07 NOTE — Patient Instructions (Signed)
Please try to place a warm wash cloth on the eye for 20 minutes at a time for about 4-6 times per day  Try an anti-inflammatory  If there isn't improvement then I would try to see an eye doctor. Dr. Katy Fitch should be able to get you in easily

## 2018-01-07 NOTE — Progress Notes (Signed)
Dean Pruitt - 84 y.o. male MRN [id number]  Date of birth: 10-22-1934  SUBJECTIVE:  Including CC & ROS.  Chief Complaint  Patient presents with  . Left eye pain    Dean Pruitt is a 84 y.o. male that is presenting with left eye pain. Ongoing for two days. He woke up two days ago and his eye was hurting. He states he feels like there is something in his eye. Hurts to blink. Denies trauma. He has been using clear eye drops with no improvement. Denies blurry vision. Denies any fever. Doesn't wear contacts. Hasn't been in some time.     Review of Systems  Constitutional: Negative for fever.  HENT: Negative for sinus pain.   Eyes: Positive for pain. Negative for redness.  Respiratory: Negative for choking.   Cardiovascular: Negative for chest pain.  Gastrointestinal: Negative for abdominal pain.    HISTORY: Past Medical, Surgical, Social, and Family History Reviewed & Updated per EMR.   Pertinent Historical Findings include:  Past Medical History:  Diagnosis Date  . Cataract   . Depression    situational  . GERD (gastroesophageal reflux disease)   . Goals of care, counseling/discussion 03/20/2017  . Hyperlipidemia   . Hypothyroidism   . Reactive depression 03/20/2017  . Thyroid cancer ([place])    PMH of; on supressive therapy    Past Surgical History:  Procedure Laterality Date  . CATARACT EXTRACTION, BILATERAL  2013 & 2014   Dr Celene Squibb  . colon polyps  2002 & 2005   negative 2008; Dr Earlean Shawl  . COLONOSCOPY W/ POLYPECTOMY    . LUNG BIOPSY Left 02/15/2017   Procedure: LUNG BIOPSY;  Surgeon: Melrose Nakayama, MD;  Location: [facility];  Service: Thoracic;  Laterality: Left;  . PLEURAL EFFUSION DRAINAGE Left 02/15/2017   Procedure: DRAINAGE OF PLEURAL EFFUSION;  Surgeon: Melrose Nakayama, MD;  Location: [facility];  Service: Thoracic;  Laterality: Left;  . PORTACATH PLACEMENT N/A 03/25/2017   Procedure: INSERTION PORT-A-CATH, right internal jugular;  Surgeon: Melrose Nakayama, MD;  Location: [facility];  Service: Thoracic;  Laterality: N/A;  . THYROIDECTOMY  2001   S/P RAI  . VIDEO ASSISTED THORACOSCOPY Left 02/15/2017   Procedure: VIDEO ASSISTED THORACOSCOPY;  Surgeon: Melrose Nakayama, MD;  Location: [facility];  Service: Thoracic;  Laterality: Left;    Allergies  Allergen Reactions  . Ativan [Lorazepam] Other (See Comments)    Mental status change-"went crazy in the head"  . Tramadol Other (See Comments)    Altered mental status "crazy in the head'  . Fentanyl Other (See Comments)    hallucinations    Family History  Problem Relation Age of Onset  . Alzheimer's disease Mother   . Hypertension Mother   . Heart disease Father        CAD and angioplasty in 47s  . Cancer Father        Bladder  . Other Brother        valvular heart disease  . Diabetes Neg Hx   . Stroke Neg Hx      Social History   Socioeconomic History  . Marital status: Married    Spouse name: Not on file  . Number of children: 2  . Years of education: Not on file  . Highest education level: Not on file  Social Needs  . Financial resource strain: Not on file  . Food insecurity - worry: Not on file  . Food insecurity - inability: Not  on file  . Transportation needs - medical: Not on file  . Transportation needs - non-medical: Not on file  Occupational History  . Occupation: Retired  Tobacco Use  . Smoking status: Former Smoker    Packs/day: 1.00    Years: 5.00    Pack years: 5.00    Types: Cigarettes    Last attempt to quit: 12/07/1958    Years since quitting: 59.1  . Smokeless tobacco: Never Used  . Tobacco comment: smoked 1956-1960, up 1 ppd  Substance and Sexual Activity  . Alcohol use: Yes    Alcohol/week: 8.4 oz    Types: 14 Glasses of wine per week  . Drug use: No  . Sexual activity: Not on file  Other Topics Concern  . Not on file  Social History Narrative   Exercise:  Yard work, walks on occasion                 PHYSICAL EXAM:  VS: BP  118/68 (BP Location: Left Arm, Patient Position: Sitting, Cuff Size: Normal)   Pulse 83   Temp 98.3 F (36.8 C) (Oral)   Ht 5\' 10"  (1.778 m)   Wt 205 lb (93 kg)   SpO2 98%   BMI 29.41 kg/m  Physical Exam Gen: NAD, alert, cooperative with exam, well-appearing ENT: normal lips, normal nasal mucosa,  Eye: normal EOM, normal conjunctiva and lids, the medial border lacrimal sac on the upper eyelid appears to be swollen. No injection. No TTP to palpation of the lids and orbit. No foreign body observed in the eye.  CV:  no edema, +2 pedal pulses   Resp: no accessory muscle use, non-labored,  Skin: no rashes, no areas of induration  Neuro: normal tone, normal sensation to touch Psych:  normal insight, alert and oriented MSK: normal gait, normal strength      ASSESSMENT & PLAN:   Pain of left eye Seems that there is irritation of the lacrimal sac on the medial upper eyelid. No change in the conjunctiva or pupil. Doesn't appear to have an infection currently. Possible for obstruction but no significant swelling. - counseled on supportive care  - if no improvement advised to be seen by an ophthalmologist.

## 2018-01-07 NOTE — Telephone Encounter (Signed)
Contacted pt's daughter, Cecille Rubin, because the pt has "something in in the corner of his left eye that feels like it hurts and it won't come out"; this started 2 days ago; Cecille Rubin stated that she put some clear eyes complete eye drops in his eye about 1200 but the pt had no relief, and he takes an NSAID; nurse triage initiated and recommendation made for pt to see physician within 4 hours;  pt offered and accepted an appointment with Dr Clearance Coots at [facility] 01/07/18 at 1500; she verbalizes understanding.   Reason for Disposition . [1] Eye pain/discomfort AND [2] more than mild  Answer Assessment - Initial Assessment Questions 1. ONSET: "When did the pain start?" (e.g., minutes, hours, days)     Days ago 01/05/18 2. TIMING: "Does the pain come and go, or has it been constant since it started?" (e.g., constant, intermittent, fleeting)     Every time he blinks 3. SEVERITY: "How bad is the pain?"   (Scale 1-10; mild, moderate or severe)   - MILD (1-3): doesn't interfere with normal activities    - MODERATE (4-7): interferes with normal activities or awakens from sleep    - SEVERE (8-10): excruciating pain and patient unable to do normal activities     moderate 4. LOCATION: "Where does it hurt?"  (e.g., eyelid, eye, cheekbone)     inside corner of left eye 5. CAUSE: "What do you think is causing the pain?"     unknown 6. VISION: "Do you have blurred vision or changes in your vision?"      no 7. EYE DISCHARGE: "Is there any discharge (pus) from the eye(s)?"  If yes, ask: "What color is it?"      no 8. FEVER: "Do you have a fever?" If so, ask: "What is it, how was it measured, and when did it start?"      no 9. OTHER SYMPTOMS: "Do you have any other symptoms?" (e.g., headache, nasal discharge, facial rash)     no 10. PREGNANCY: "Is there any chance you are pregnant?" "When was your last menstrual period?"       n/a  Protocols used: EYE PAIN-A-AH

## 2018-01-07 NOTE — Assessment & Plan Note (Signed)
Seems that there is irritation of the lacrimal sac on the medial upper eyelid. No change in the conjunctiva or pupil. Doesn't appear to have an infection currently. Possible for obstruction but no significant swelling. - counseled on supportive care  - if no improvement advised to be seen by an ophthalmologist.

## 2018-01-14 ENCOUNTER — Ambulatory Visit: Payer: PPO | Admitting: Family Medicine

## 2018-01-14 DIAGNOSIS — H9201 Otalgia, right ear: Secondary | ICD-10-CM | POA: Diagnosis not present

## 2018-01-17 ENCOUNTER — Ambulatory Visit: Payer: PPO | Attending: Family Medicine

## 2018-01-17 ENCOUNTER — Other Ambulatory Visit: Payer: Self-pay

## 2018-01-17 DIAGNOSIS — M545 Low back pain: Secondary | ICD-10-CM | POA: Insufficient documentation

## 2018-01-17 DIAGNOSIS — M6281 Muscle weakness (generalized): Secondary | ICD-10-CM | POA: Insufficient documentation

## 2018-01-17 DIAGNOSIS — R2681 Unsteadiness on feet: Secondary | ICD-10-CM | POA: Insufficient documentation

## 2018-01-17 DIAGNOSIS — R293 Abnormal posture: Secondary | ICD-10-CM | POA: Insufficient documentation

## 2018-01-17 NOTE — Therapy (Addendum)
[place], [place], [address] Phone: [phone]   Fax:  [phone]  Physical Therapy Evaluation  Patient Details  Name: Dean Pruitt MRN: [id number] Date of Birth: 09-04-1934 No Data Recorded  Encounter Date: 01/17/2018  PT End of Session - 01/17/18 1438    Visit Number  1    Number of Visits  7    Date for PT Re-Evaluation  02/12/18    Authorization Type  MCR    PT Start Time  0130    PT Stop Time  0215    PT Time Calculation (min)  45 min    Activity Tolerance  Patient tolerated treatment well    Behavior During Therapy  [facility] for tasks assessed/performed       Past Medical History:  Diagnosis Date  . Cataract   . Depression    situational  . GERD (gastroesophageal reflux disease)   . Goals of care, counseling/discussion 03/20/2017  . Hyperlipidemia   . Hypothyroidism   . Reactive depression 03/20/2017  . Thyroid cancer ([place])    PMH of; on supressive therapy    Past Surgical History:  Procedure Laterality Date  . CATARACT EXTRACTION, BILATERAL  2013 & 2014   Dr Celene Squibb  . colon polyps  2002 & 2005   negative 2008; Dr Earlean Shawl  . COLONOSCOPY W/ POLYPECTOMY    . LUNG BIOPSY Left 02/15/2017   Procedure: LUNG BIOPSY;  Surgeon: Melrose Nakayama, MD;  Location: [place];  Service: Thoracic;  Laterality: Left;  . PLEURAL EFFUSION DRAINAGE Left 02/15/2017   Procedure: DRAINAGE OF PLEURAL EFFUSION;  Surgeon: Melrose Nakayama, MD;  Location: [place];  Service: Thoracic;  Laterality: Left;  . PORTACATH PLACEMENT N/A 03/25/2017   Procedure: INSERTION PORT-A-CATH, right internal jugular;  Surgeon: Melrose Nakayama, MD;  Location: [place];  Service: Thoracic;  Laterality: N/A;  . THYROIDECTOMY  2001   S/P RAI  . VIDEO ASSISTED THORACOSCOPY Left 02/15/2017   Procedure: VIDEO ASSISTED THORACOSCOPY;  Surgeon: Melrose Nakayama, MD;  Location: [place];  Service: Thoracic;  Laterality: Left;     There were no vitals filed for this visit.   Subjective Assessment - 01/17/18 1333    Subjective  He reports a fall a week ago.  He reports hip issues and decr balance. He feels hitting head on floor may contribute to balance issues.   Daughter report here for back and hip. ... Pt feels problem is with head .    First fall  end of November.   Did not hit head.     Hit head a week or 2 aago.     Reports no hip or back pain.         Patient is accompained by:  Family member daughter    Pertinent History  possibel dementia per daughter    Limitations  -- dizziness on rising from chair    How long can you sit comfortably?  as needed    How long can you stand comfortably?  as needed    How long can you walk comfortably?  as needed    Patient Stated Goals  NOt sure why he is here as he did not want to come.  Main concern is dizziness.    Currently in Pain?  No/denies         [facility] PT Assessment - 01/17/18 0001      Assessment   Medical Diagnosis  back pain.  Onset Date/Surgical Date  -- last year    Next MD Visit  01/2018    Prior Therapy  no      Precautions   Precautions  None      Restrictions   Weight Bearing Restrictions  No      Balance Screen   Has the patient fallen in the past 6 months  Yes    How many times?  6-8 usuall trying to get up or bend over    Has the patient had a decrease in activity level because of a fear of falling?   No    Is the patient reluctant to leave their home because of a fear of falling?   No      Home Environment   Living Environment  Private residence    Living Arrangements  Children    Type of [place] Access  Stairs to enter    Entrance Stairs-Number of Steps  1    Entrance Stairs-Rails  None    Home Layout  Two level    Alternate Level Stairs-Number of Steps  12    Alternate Level Stairs-Rails  Right;Left;Can reach both      Prior Function   Level of Independence  Independent    Vocation  Retired      Associate Professor    Overall Cognitive Status  Within Functional Limits for tasks assessed      Posture/Postural Control   Posture Comments  rounde shoulders flat lumbar spine      ROM / Strength   AROM / PROM / Strength  AROM;Strength;PROM      AROM   AROM Assessment Site  Lumbar    Lumbar Flexion  90    Lumbar Extension  25    Lumbar - Right Side Bend  10    Lumbar - Left Side Bend  10      PROM   Overall PROM Comments  all hip ROM WFl and generall WNL    PROM Assessment Site  Hip    Right/Left Hip  Right;Left      Strength   Overall Strength Comments  LE WNL all groups exc ept hip aabdujction and extension 4/5       Flexibility   Soft Tissue Assessment /Muscle Length  yes      Ambulation/Gait   Gait Comments  WNL and able to walk on heels and toes safely.       Standardized Balance Assessment   Standardized Balance Assessment  Berg Balance Test      Berg Balance Test   Sit to Stand  Able to stand without using hands and stabilize independently    Standing Unsupported  Able to stand safely 2 minutes    Sitting with Back Unsupported but Feet Supported on Floor or Stool  Able to sit safely and securely 2 minutes    Stand to Sit  Sits safely with minimal use of hands    Transfers  Able to transfer safely, minor use of hands    Standing Unsupported with Eyes Closed  Able to stand 10 seconds safely    Standing Ubsupported with Feet Together  Able to place feet together independently and stand 1 minute safely    From Standing, Reach Forward with Outstretched Arm  Can reach forward >12 cm safely (5")    From Standing Position, Pick up Object from Floor  Able to pick up shoe safely and easily    From Standing  Position, Turn to Look Behind Over each Shoulder  Looks behind from both sides and weight shifts well    Turn 360 Degrees  Able to turn 360 degrees safely in 4 seconds or less    Standing Unsupported, Alternately Place Feet on Step/Stool  Able to stand independently and safely and complete 8  steps in 20 seconds    Standing Unsupported, One Foot in Front  Able to plae foot ahead of the other independently and hold 30 seconds    Standing on One Leg  Tries to lift leg/unable to hold 3 seconds but remains standing independently    Total Score  51             Objective measurements completed on examination: See above findings.                PT Short Term Goals - 01/17/18 1329      PT SHORT TERM GOAL #1   Title  --    Time  3    Period  Weeks    Status  New      PT SHORT TERM GOAL #2   Title  --    Time  3    Period  Weeks    Status  New      PT SHORT TERM GOAL #3   Title  --    Time  3    Period  Weeks    Status  New        PT Long Term Goals - 01/17/18 1330      PT LONG TERM GOAL #1   Title  He will report pain decreased 75% and be intermittant    Time  4    Period  Weeks    Status  New      PT LONG TERM GOAL #2   Title  He will be indewpendent with alll HEP issued.     Time  4    Period  Weeks    Status  New      PT LONG TERM GOAL #3   Title  BERG score improve to 54/56    Time  4    Period  Weeks    Status  New      PT LONG TERM GOAL #4   Title  He will report diziness on rising decr 50% or more    Time  4    Period  Weeks    Status  New      PT LONG TERM GOAL #5   Title  Hip strength incr to 4+/5 for improved function ono feet    Time  4    Period  Weeks    Status  New             Plan - 01/17/18 1427    Clinical Impression Statement  Mr Vink report no pain just dissiness on rising from chair and getting out of bed that resolves quickly. Then has no limitations.   He reported no pain and testing showed hip ext and abduction weakness.  With some HEP this should improve.       History and Personal Factors relevant to plan of care:  Dementia possible pe rdaughter     Clinical Presentation  Evolving    Clinical Presentation due to:  He reports improving since seeing DO with pain  but most concern about  dizziness    Clinical Decision Making  Moderate    PT Frequency  2x / week    PT Duration  3 weeks    PT Next Visit Plan  Establish HEP     Recommended Other Services  Neuro PT for dizziness if not resolved by end of this ortho treatment    Consulted and Agree with Plan of Care  Patient;Family member/caregiver    Family Member Consulted  daughter       Patient will benefit from skilled therapeutic intervention in order to improve the following deficits and impairments:  Postural dysfunction, Dizziness, Decreased balance, Decreased strength  Visit Diagnosis: Low back pain, unspecified back pain laterality, unspecified chronicity, with sciatica presence unspecified  Abnormal posture  Unsteadiness on feet  Muscle weakness (generalized)     Problem List Patient Active Problem List   Diagnosis Date Noted  . Pain of left eye 01/07/2018  . Otalgia, right 12/21/2017  . Bursitis of hip 11/17/2017  . Bilateral low back pain without sciatica 11/17/2017  . Thoracic aortic aneurysm without rupture ([place]) 11/09/2017  . Muscular chest pain 11/09/2017  . Recurrent falls 11/09/2017  . Hypothyroidism 10/13/2017  . B12 deficiency 10/13/2017  . Change in stool 10/13/2017  . Port catheter in place 06/02/2017  . Degenerative arthritis of left knee 03/24/2017  . Goals of care, counseling/discussion 03/20/2017  . Encounter for antineoplastic chemotherapy 03/20/2017  . Reactive depression 03/20/2017  . Malignant mesothelioma of pleura ([place]) 02/19/2017  . History of lung biopsy 02/17/2017  . Pleural effusion 02/15/2017  . Greater trochanteric bursitis of left hip 12/31/2016  . Pleural effusion, left 10/27/2016  . infrarenal abdominal aortic aneurysm without rupture ([place]) - [place] needed 2020 10/22/2016  . Left lumbar radiculopathy 10/12/2016  . Bursitis of left hip 09/30/2016  . Left foot drop 09/30/2016  . Toe pain, left 08/13/2016  . Myalgia 07/04/2016  . Mild cognitive impairment 07/03/2016   . Adjustment disorder with depressed mood 07/03/2016  . Frequency of urination 09/26/2014  . Macular degeneration 06/22/2011  . Fasting hyperglycemia 05/20/2009  . NONSPECIFIC ABNORMAL ELECTROCARDIOGRAM 05/20/2009  . THYROID CANCER, HX OF 05/20/2009  . History of colonic polyps 05/20/2009  . Hyperlipidemia 11/21/2007    Darrel Hoover  PT 01/17/2018, 2:39 PM  [place] [facility] [address] [place], [place], [address] Phone: [phone]   Fax:  [phone]  Name: Dean Pruitt MRN: [id number] Date of Birth: 1933/12/27

## 2018-01-18 ENCOUNTER — Ambulatory Visit: Payer: PPO | Admitting: Physical Therapy

## 2018-01-18 ENCOUNTER — Encounter: Payer: Self-pay | Admitting: Physical Therapy

## 2018-01-18 ENCOUNTER — Telehealth: Payer: Self-pay | Admitting: Internal Medicine

## 2018-01-18 DIAGNOSIS — R293 Abnormal posture: Secondary | ICD-10-CM

## 2018-01-18 DIAGNOSIS — M6281 Muscle weakness (generalized): Secondary | ICD-10-CM

## 2018-01-18 DIAGNOSIS — M545 Low back pain: Secondary | ICD-10-CM

## 2018-01-18 DIAGNOSIS — R42 Dizziness and giddiness: Secondary | ICD-10-CM

## 2018-01-18 DIAGNOSIS — R2681 Unsteadiness on feet: Secondary | ICD-10-CM

## 2018-01-18 NOTE — Telephone Encounter (Signed)
Copied from [place] [id number]. Topic: Inquiry >> Jan 18, 2018  2:06 PM Scherrie Gerlach wrote: Reason for CRM: daughter states since pt's fall and hitting his head, he has has had dizzy w/ vertigo. Today at his first PT appt, they advised pt they have someone there that can work with him if they get orders from the doctor.  Daughter, Cecille Rubin, requesting dr send orders for PT for vertigo.  Pt is going to [facility]  Daughter hopes they can start this asap

## 2018-01-18 NOTE — Therapy (Addendum)
[place], [place], [address] Phone: [phone]   Fax:  [phone]  Physical Therapy Treatment  Patient Details  Name: Dean Pruitt MRN: [id number] Date of Birth: December 02, 1934 No Data Recorded  Encounter Date: 01/18/2018  PT End of Session - 01/18/18 1212    Visit Number  2    Number of Visits  7    Date for PT Re-Evaluation  02/12/18    Authorization Type  MCR    PT Start Time  2035    PT Stop Time  1223    PT Time Calculation (min)  38 min       Past Medical History:  Diagnosis Date  . Cataract   . Depression    situational  . GERD (gastroesophageal reflux disease)   . Goals of care, counseling/discussion 03/20/2017  . Hyperlipidemia   . Hypothyroidism   . Reactive depression 03/20/2017  . Thyroid cancer ([place])    PMH of; on supressive therapy    Past Surgical History:  Procedure Laterality Date  . CATARACT EXTRACTION, BILATERAL  2013 & 2014   Dr Celene Squibb  . colon polyps  2002 & 2005   negative 2008; Dr Earlean Shawl  . COLONOSCOPY W/ POLYPECTOMY    . LUNG BIOPSY Left 02/15/2017   Procedure: LUNG BIOPSY;  Surgeon: Melrose Nakayama, MD;  Location: [place];  Service: Thoracic;  Laterality: Left;  . PLEURAL EFFUSION DRAINAGE Left 02/15/2017   Procedure: DRAINAGE OF PLEURAL EFFUSION;  Surgeon: Melrose Nakayama, MD;  Location: [place];  Service: Thoracic;  Laterality: Left;  . PORTACATH PLACEMENT N/A 03/25/2017   Procedure: INSERTION PORT-A-CATH, right internal jugular;  Surgeon: Melrose Nakayama, MD;  Location: [place];  Service: Thoracic;  Laterality: N/A;  . THYROIDECTOMY  2001   S/P RAI  . VIDEO ASSISTED THORACOSCOPY Left 02/15/2017   Procedure: VIDEO ASSISTED THORACOSCOPY;  Surgeon: Melrose Nakayama, MD;  Location: [place];  Service: Thoracic;  Laterality: Left;    There were no vitals filed for this visit.  Subjective Assessment - 01/17/18 1333    Subjective  He reports a fall a  week ago.  He reports hip issues and decr balance. He feels hitting head on floor may contribute to balance issues.   Daughter report here for back and hip. ... Pt feels problem is with head .    First fall  end of November.   Did not hit head.     Hit head a week or 2 aago.     Reports no hip or back pain.         Patient is accompained by:  Family member daughter    Pertinent History  possibel dementia per daughter    Limitations  -- dizziness on rising from chair    How long can you sit comfortably?  as needed    How long can you stand comfortably?  as needed    How long can you walk comfortably?  as needed    Patient Stated Goals  NOt sure why he is here as he did not want to come.  Main concern is dizziness.    Currently in Pain?  No/denies                      [facility] Adult PT Treatment/Exercise - 01/18/18 0001      Lumbar Exercises: Aerobic   Nustep  L5 UE/LE x 5 minutes  Lumbar Exercises: Standing   Other Standing Lumbar Exercises  SLS (10 sec best), tandem stance trials (30 sec best) intermittent UE assist       Lumbar Exercises: Supine   Clam  20 reps green band     Clam Limitations  cues for abdominal draw in    Bridge  10 reps    Bridge Limitations  cues for abdominals and glutea contraction             PT Education - 01/18/18 1326    Education provided  Yes    Education Details  HEP/ need for referral for vestibular Eval/treatment    Person(s) Educated  Patient    Methods  Explanation;Handout    Comprehension  Verbalized understanding       PT Short Term Goals - 01/17/18 1329      PT SHORT TERM GOAL #1   Title  --    Time  3    Period  Weeks    Status  New      PT SHORT TERM GOAL #2   Title  --    Time  3    Period  Weeks    Status  New      PT SHORT TERM GOAL #3   Title  --    Time  3    Period  Weeks    Status  New        PT Long Term Goals - 01/17/18 1330      PT LONG TERM GOAL #1   Title  He will report pain  decreased 75% and be intermittant    Time  4    Period  Weeks    Status  New      PT LONG TERM GOAL #2   Title  He will be indewpendent with alll HEP issued.     Time  4    Period  Weeks    Status  New      PT LONG TERM GOAL #3   Title  BERG score improve to 54/56    Time  4    Period  Weeks    Status  New      PT LONG TERM GOAL #4   Title  He will report diziness on rising decr 50% or more    Time  4    Period  Weeks    Status  New      PT LONG TERM GOAL #5   Title  Hip strength incr to 4+/5 for improved function ono feet    Time  4    Period  Weeks    Status  New            Plan - 01/18/18 1256    Clinical Impression Statement  Pt arrives reporting no pain except usual "old age" pain in hips intermittently. Daughter stayed in lobby for treatment. Began hip abduction and extension strengthening with core draw in. Began balance exercises. Pt reports mild increase in pain in right hip with SLS. He was able to perform SLS 10 seconds on each leg after several attempts as well as maintain tandem stance for 30 seconds after several tries. Pt reports dizziness is still bothersome with first rising from supine position. I spoke to his daughter about requesting referral for vestibular treatment if MD agrees. Pt reports no increased pain after session. One episode of tripping during gait in clinic without fall.     PT Next Visit Plan  review and continue establishing comprehensive HEP for core/gluteal strength, balance.     PT Home Exercise Plan  SLS, tandem stance both at counter, bridge, supine clam green band.     Consulted and Agree with Plan of Care  Patient;Family member/caregiver    Family Member Consulted  daughter       Patient will benefit from skilled therapeutic intervention in order to improve the following deficits and impairments:  Postural dysfunction, Dizziness, Decreased balance, Decreased strength  Visit Diagnosis: Low back pain, unspecified back pain  laterality, unspecified chronicity, with sciatica presence unspecified  Abnormal posture  Unsteadiness on feet  Muscle weakness (generalized)     Problem List Patient Active Problem List   Diagnosis Date Noted  . Pain of left eye 01/07/2018  . Otalgia, right 12/21/2017  . Bursitis of hip 11/17/2017  . Bilateral low back pain without sciatica 11/17/2017  . Thoracic aortic aneurysm without rupture ([place]) 11/09/2017  . Muscular chest pain 11/09/2017  . Recurrent falls 11/09/2017  . Hypothyroidism 10/13/2017  . B12 deficiency 10/13/2017  . Change in stool 10/13/2017  . Port catheter in place 06/02/2017  . Degenerative arthritis of left knee 03/24/2017  . Goals of care, counseling/discussion 03/20/2017  . Encounter for antineoplastic chemotherapy 03/20/2017  . Reactive depression 03/20/2017  . Malignant mesothelioma of pleura ([place]) 02/19/2017  . History of lung biopsy 02/17/2017  . Pleural effusion 02/15/2017  . Greater trochanteric bursitis of left hip 12/31/2016  . Pleural effusion, left 10/27/2016  . infrarenal abdominal aortic aneurysm without rupture ([place]) - [place] needed 2020 10/22/2016  . Left lumbar radiculopathy 10/12/2016  . Bursitis of left hip 09/30/2016  . Left foot drop 09/30/2016  . Toe pain, left 08/13/2016  . Myalgia 07/04/2016  . Mild cognitive impairment 07/03/2016  . Adjustment disorder with depressed mood 07/03/2016  . Frequency of urination 09/26/2014  . Macular degeneration 06/22/2011  . Fasting hyperglycemia 05/20/2009  . NONSPECIFIC ABNORMAL ELECTROCARDIOGRAM 05/20/2009  . THYROID CANCER, HX OF 05/20/2009  . History of colonic polyps 05/20/2009  . Hyperlipidemia 11/21/2007    Dorene Ar, PTA 01/18/2018, 1:27 PM  [facility] [address] [place], [place], [address] Phone: [phone]   Fax:  [phone]  Name: Javarion Douty MRN: [id number] Date of Birth: 10-24-1934

## 2018-01-22 ENCOUNTER — Ambulatory Visit
Admission: RE | Admit: 2018-01-22 | Discharge: 2018-01-22 | Disposition: A | Discharge status: Home / Self Care | Payer: PPO | Source: Ambulatory Visit | Attending: Family Medicine | Admitting: Family Medicine

## 2018-01-22 DIAGNOSIS — M4807 Spinal stenosis, lumbosacral region: Secondary | ICD-10-CM

## 2018-01-22 DIAGNOSIS — M5136 Other intervertebral disc degeneration, lumbar region: Secondary | ICD-10-CM | POA: Diagnosis not present

## 2018-01-26 NOTE — Progress Notes (Signed)
[facility] [place], [place] [address] Phone: [phone] Subjective:    I'm seeing this patient by the request  of:    CC: Back pain and right hip pain follow-up  IRJ:[id number]  Dean Pruitt is a 84 y.o. male coming in with complaint of right hip pain. He had an MRI and is here for the results.  Patient did have the MRI done and was 82.  MRI of the lumbar spine showed abdominal aortic aneurysm that has been stable.  Patient also has mild degenerative changes of the lumbar spine but no focal spinal stenosis noted.  Patient continues to have pain on the left.    Past Medical History:  Diagnosis Date  . Cataract   . Depression    situational  . GERD (gastroesophageal reflux disease)   . Goals of care, counseling/discussion 03/20/2017  . Hyperlipidemia   . Hypothyroidism   . Reactive depression 03/20/2017  . Thyroid cancer ([place])    PMH of; on supressive therapy   Past Surgical History:  Procedure Laterality Date  . CATARACT EXTRACTION, BILATERAL  2013 & 2014   Dr Celene Squibb  . colon polyps  2002 & 2005   negative 2008; Dr Earlean Shawl  . COLONOSCOPY W/ POLYPECTOMY    . LUNG BIOPSY Left 02/15/2017   Procedure: LUNG BIOPSY;  Surgeon: Melrose Nakayama, MD;  Location: [place];  Service: Thoracic;  Laterality: Left;  . PLEURAL EFFUSION DRAINAGE Left 02/15/2017   Procedure: DRAINAGE OF PLEURAL EFFUSION;  Surgeon: Melrose Nakayama, MD;  Location: [place];  Service: Thoracic;  Laterality: Left;  . PORTACATH PLACEMENT N/A 03/25/2017   Procedure: INSERTION PORT-A-CATH, right internal jugular;  Surgeon: Melrose Nakayama, MD;  Location: [place];  Service: Thoracic;  Laterality: N/A;  . THYROIDECTOMY  2001   S/P RAI  . VIDEO ASSISTED THORACOSCOPY Left 02/15/2017   Procedure: VIDEO ASSISTED THORACOSCOPY;  Surgeon: Melrose Nakayama, MD;  Location: [place];  Service: Thoracic;  Laterality: Left;   Social History   Socioeconomic History  .  Marital status: Married    Spouse name: None  . Number of children: 2  . Years of education: None  . Highest education level: None  Social Needs  . Financial resource strain: None  . Food insecurity - worry: None  . Food insecurity - inability: None  . Transportation needs - medical: None  . Transportation needs - non-medical: None  Occupational History  . Occupation: Retired  Tobacco Use  . Smoking status: Former Smoker    Packs/day: 1.00    Years: 5.00    Pack years: 5.00    Types: Cigarettes    Last attempt to quit: 12/07/1958    Years since quitting: 59.1  . Smokeless tobacco: Never Used  . Tobacco comment: smoked 1956-1960, up 1 ppd  Substance and Sexual Activity  . Alcohol use: Yes    Alcohol/week: 8.4 oz    Types: 14 Glasses of wine per week  . Drug use: No  . Sexual activity: None  Other Topics Concern  . None  Social History Narrative   Exercise:  Yard work, walks on occasion               Allergies  Allergen Reactions  . Ativan [Lorazepam] Other (See Comments)    Mental status change-"went crazy in the head"  . Tramadol Other (See Comments)    Altered mental status "crazy in the head'  . Fentanyl Other (  See Comments)    hallucinations   Family History  Problem Relation Age of Onset  . Alzheimer's disease Mother   . Hypertension Mother   . Heart disease Father        CAD and angioplasty in 45s  . Cancer Father        Bladder  . Other Brother        valvular heart disease  . Diabetes Neg Hx   . Stroke Neg Hx      Past medical history, social, surgical and family history all reviewed in electronic medical record.  No pertanent information unless stated regarding to the chief complaint.   Review of Systems:Review of systems updated and as accurate as of 01/27/18  No headache, visual changes, nausea, vomiting, diarrhea, constipation, dizziness, abdominal pain, skin rash, fevers, chills, night sweats, weight loss, swollen lymph nodes, body aches,  joint swelling,  chest pain, shortness of breath, mood changes.  Positive muscle aches  Objective  Blood pressure 110/84, pulse 64, height 5\' 10"  (1.778 m), weight 212 lb (96.2 kg), SpO2 95 %. Systems examined below as of 01/27/18   General: No apparent distress alert and oriented x3 mood and affect normal, dressed appropriately.  HEENT: Pupils equal, extraocular movements intact  Respiratory: Patient's speak in full sentences and does not appear short of breath  Cardiovascular: No lower extremity edema, non tender, no erythema  Skin: Warm dry intact with no signs of infection or rash on extremities or on axial skeleton.  Abdomen: Soft nontender  Neuro: Cranial nerves II through XII are intact, neurovascularly intact in all extremities with 2+ DTRs and 2+ pulses.  Lymph: No lymphadenopathy of posterior or anterior cervical chain or axillae bilaterally.  Gait normal with good balance and coordination.  MSK:  Non tender with full range of motion and good stability and symmetric strength and tone of shoulders, elbows, wrist, , knee and ankles bilaterally.  Arthritic changes of multiple joints Patient's right hip still severely tender over the right greater trochanteric area.  Patient does have a positive Faber test.  Negative straight leg test.  No discomfort over the back at this moment to palpation.  Mild discomfort over the sacroiliac joint.  After verbal consent patient was prepped with alcohol swabs and with a 21-gauge 2 inch needle was injected into the right greater trochanteric area with a total of 1 cc of 0.5% Marcaine and 1 cc of Kenalog 40 mg/mL.  No blood loss.  Postinjection instructions given.     Impression and Recommendations:     This case required medical decision making of moderate complexity.      Note: This dictation was prepared with Dragon dictation along with smaller phrase technology. Any transcriptional errors that result from this process are unintentional.

## 2018-01-27 ENCOUNTER — Encounter: Payer: Self-pay | Admitting: Family Medicine

## 2018-01-27 ENCOUNTER — Ambulatory Visit (INDEPENDENT_AMBULATORY_CARE_PROVIDER_SITE_OTHER): Payer: PPO | Admitting: Family Medicine

## 2018-01-27 DIAGNOSIS — M7061 Trochanteric bursitis, right hip: Secondary | ICD-10-CM | POA: Diagnosis not present

## 2018-01-27 DIAGNOSIS — M545 Low back pain, unspecified: Secondary | ICD-10-CM

## 2018-01-27 NOTE — Patient Instructions (Addendum)
Good to see you  We tried to inject you today  If not better we will try an epidural at L3/4  Call us on Monday and if not better we will do the injection

## 2018-01-27 NOTE — Assessment & Plan Note (Signed)
No signs of an MRI of any significant nerve impingement.  Do think that a possible epidural could be beneficial well.  Patient will see how he responds to the injections in the hip and if no improvement then will consider the epidural.

## 2018-01-27 NOTE — Assessment & Plan Note (Signed)
Patient given an injection today, however to the procedure well.  We discussed icing regimen and home.  Patient will follow up with me again in 4 weeks

## 2018-01-31 ENCOUNTER — Ambulatory Visit: Payer: PPO

## 2018-01-31 DIAGNOSIS — M545 Low back pain: Secondary | ICD-10-CM | POA: Diagnosis not present

## 2018-01-31 DIAGNOSIS — M6281 Muscle weakness (generalized): Secondary | ICD-10-CM

## 2018-01-31 DIAGNOSIS — R2681 Unsteadiness on feet: Secondary | ICD-10-CM

## 2018-01-31 NOTE — Therapy (Signed)
[place], [place], [address] Phone: [phone]   Fax:  [phone]  Physical Therapy Treatment  Patient Details  Name: Dean Pruitt MRN: [id number] Date of Birth: [birth year] No Data Recorded  Encounter Date: 01/31/2018  PT End of Session - 01/31/18 1428    Visit Number  3    Number of Visits  7    Date for PT Re-Evaluation  02/12/18    Authorization Type  MCR    PT Start Time  0218    PT Stop Time  0258    PT Time Calculation (min)  40 min    Activity Tolerance  Patient tolerated treatment well    Behavior During Therapy  [facility] for tasks assessed/performed       Past Medical History:  Diagnosis Date  . Cataract   . Depression    situational  . GERD (gastroesophageal reflux disease)   . Goals of care, counseling/discussion 03/20/2017  . Hyperlipidemia   . Hypothyroidism   . Reactive depression 03/20/2017  . Thyroid cancer ([place])    PMH of; on supressive therapy    Past Surgical History:  Procedure Laterality Date  . CATARACT EXTRACTION, BILATERAL  2013 & 2014   Dr Celene Squibb  . colon polyps  2002 & 2005   negative 2008; Dr Earlean Shawl  . COLONOSCOPY W/ POLYPECTOMY    . LUNG BIOPSY Left 02/15/2017   Procedure: LUNG BIOPSY;  Surgeon: Melrose Nakayama, MD;  Location: [facility];  Service: Thoracic;  Laterality: Left;  . PLEURAL EFFUSION DRAINAGE Left 02/15/2017   Procedure: DRAINAGE OF PLEURAL EFFUSION;  Surgeon: Melrose Nakayama, MD;  Location: [facility];  Service: Thoracic;  Laterality: Left;  . PORTACATH PLACEMENT N/A 03/25/2017   Procedure: INSERTION PORT-A-CATH, right internal jugular;  Surgeon: Melrose Nakayama, MD;  Location: [facility];  Service: Thoracic;  Laterality: N/A;  . THYROIDECTOMY  2001   S/P RAI  . VIDEO ASSISTED THORACOSCOPY Left 02/15/2017   Procedure: VIDEO ASSISTED THORACOSCOPY;  Surgeon: Melrose Nakayama, MD;  Location: [facility];  Service: Thoracic;  Laterality: Left;     There were no vitals filed for this visit.  Subjective Assessment - 01/31/18 1429    Subjective  RT hip much improved post laast injection and no pain at rest mild pain with walking    Currently in Pain?  No/denies                      [facility] Adult PT Treatment/Exercise - 01/31/18 0001      Neuro Re-ed    Neuro Re-ed Details   worked on tandem walking and balance in parallel bars. 12 feet x 6 . He used bars for support 4-5x over all trips.   then tandem stance with ffont foot moved to 90 degrees from heel   He needed cuing and did not do entirely correct after this We stopped with RT leg stane whenRT hip became a little sore.       Lumbar Exercises: Aerobic   Nustep  L5 UE/LE x 5 minutes       Lumbar Exercises: Standing   Forward Lunge  15 reps RT  /LT     Side Lunge  10 reps;Limitations    Side Lunge Limitations  RT/LT     Wall Slides  15 reps    Other Standing Lumbar Exercises  sit to stand       Lumbar Exercises: Supine  Bridge  20 reps    Bridge with [employer]  20 reps    Bridge with clamshell  Limitations    Bridge with [employer] Limitations  30 reps with blue band.     Straight Leg Raise  Limitations;20 reps    Straight Leg Raises Limitations  RT/LT               PT Short Term Goals - 01/17/18 1329      PT SHORT TERM GOAL #1   Title  --    Time  3    Period  Weeks    Status  New      PT SHORT TERM GOAL #2   Title  --    Time  3    Period  Weeks    Status  New      PT SHORT TERM GOAL #3   Title  --    Time  3    Period  Weeks    Status  New        PT Long Term Goals - 01/17/18 1330      PT LONG TERM GOAL #1   Title  He will report pain decreased 75% and be intermittant    Time  4    Period  Weeks    Status  New      PT LONG TERM GOAL #2   Title  He will be indewpendent with alll HEP issued.     Time  4    Period  Weeks    Status  New      PT LONG TERM GOAL #3   Title  BERG score improve to 54/56    Time  4     Period  Weeks    Status  New      PT LONG TERM GOAL #4   Title  He will report diziness on rising decr 50% or more    Time  4    Period  Weeks    Status  New      PT LONG TERM GOAL #5   Title  Hip strength incr to 4+/5 for improved function ono feet    Time  4    Period  Weeks    Status  New            Plan - 01/31/18 1501    Clinical Impression Statement  Pain in RT hip only with walking or activity on feet.   Injection helped.   Reported fatigue at end of session but no pain . He will see neuro PT for vertigo treatment.   Add to hEP    PT Treatment/Interventions  Manual techniques;Patient/family education;Therapeutic exercise;Moist Heat;Ultrasound;Iontophoresis 4mg /ml Dexamethasone;Balance training    PT Next Visit Plan  review and continue establishing comprehensive HEP for core/gluteal strength, balance.     PT Home Exercise Plan  SLS, tandem stance both at counter, bridge, supine clam green band.     Consulted and Agree with Plan of Care  Patient    Family Member Consulted  daughter       Patient will benefit from skilled therapeutic intervention in order to improve the following deficits and impairments:  Postural dysfunction, Dizziness, Decreased balance, Decreased strength  Visit Diagnosis: Unsteadiness on feet  Muscle weakness (generalized)     Problem List Patient Active Problem List   Diagnosis Date Noted  . Greater trochanteric bursitis, right 01/27/2018  . Pain of left eye 01/07/2018  . Otalgia,  right 12/21/2017  . Bursitis of hip 11/17/2017  . Bilateral low back pain without sciatica 11/17/2017  . Thoracic aortic aneurysm without rupture ([place]) 11/09/2017  . Muscular chest pain 11/09/2017  . Recurrent falls 11/09/2017  . Hypothyroidism 10/13/2017  . B12 deficiency 10/13/2017  . Change in stool 10/13/2017  . Port catheter in place 06/02/2017  . Degenerative arthritis of left knee 03/24/2017  . Goals of care, counseling/discussion 03/20/2017  .  Encounter for antineoplastic chemotherapy 03/20/2017  . Reactive depression 03/20/2017  . Malignant mesothelioma of pleura ([place]) 02/19/2017  . History of lung biopsy 02/17/2017  . Pleural effusion 02/15/2017  . Greater trochanteric bursitis of left hip 12/31/2016  . Pleural effusion, left 10/27/2016  . infrarenal abdominal aortic aneurysm without rupture ([place]) - [place] needed 2020 10/22/2016  . Left lumbar radiculopathy 10/12/2016  . Bursitis of left hip 09/30/2016  . Left foot drop 09/30/2016  . Toe pain, left 08/13/2016  . Myalgia 07/04/2016  . Mild cognitive impairment 07/03/2016  . Adjustment disorder with depressed mood 07/03/2016  . Frequency of urination 09/26/2014  . Macular degeneration 06/22/2011  . Fasting hyperglycemia 05/20/2009  . NONSPECIFIC ABNORMAL ELECTROCARDIOGRAM 05/20/2009  . THYROID CANCER, HX OF 05/20/2009  . History of colonic polyps 05/20/2009  . Hyperlipidemia 11/21/2007    Darrel Hoover  PT 01/31/2018, 3:04 PM  [facility] [address] [place], [place], [address] Phone: [phone]   Fax:  [phone]  Name: Dean Pruitt MRN: [id number] Date of Birth: 1934-08-30

## 2018-01-31 NOTE — Addendum Note (Signed)
Addended by: Darrel Hoover on: 01/31/2018 03:25 PM   Modules accepted: Orders

## 2018-02-02 ENCOUNTER — Ambulatory Visit: Payer: PPO | Admitting: Physical Therapy

## 2018-02-02 ENCOUNTER — Encounter: Payer: Self-pay | Admitting: Physical Therapy

## 2018-02-02 DIAGNOSIS — R2681 Unsteadiness on feet: Secondary | ICD-10-CM

## 2018-02-02 DIAGNOSIS — R293 Abnormal posture: Secondary | ICD-10-CM

## 2018-02-02 DIAGNOSIS — M545 Low back pain: Secondary | ICD-10-CM

## 2018-02-02 DIAGNOSIS — M6281 Muscle weakness (generalized): Secondary | ICD-10-CM

## 2018-02-02 NOTE — Therapy (Signed)
[place], [place], [address] Phone: [phone]   Fax:  [phone]  Physical Therapy Treatment  Patient Details  Name: Dean Pruitt MRN: [id number] Date of Birth: 1934-05-11 No Data Recorded  Encounter Date: 02/02/2018  PT End of Session - 02/02/18 1306    Visit Number  4    Number of Visits  7    Date for PT Re-Evaluation  02/12/18    Authorization Type  MCR    PT Start Time  0100    PT Stop Time  0140    PT Time Calculation (min)  40 min       Past Medical History:  Diagnosis Date  . Cataract   . Depression    situational  . GERD (gastroesophageal reflux disease)   . Goals of care, counseling/discussion 03/20/2017  . Hyperlipidemia   . Hypothyroidism   . Reactive depression 03/20/2017  . Thyroid cancer ([place])    PMH of; on supressive therapy    Past Surgical History:  Procedure Laterality Date  . CATARACT EXTRACTION, BILATERAL  2013 & 2014   Dr Celene Squibb  . colon polyps  2002 & 2005   negative 2008; Dr Earlean Shawl  . COLONOSCOPY W/ POLYPECTOMY    . LUNG BIOPSY Left 02/15/2017   Procedure: LUNG BIOPSY;  Surgeon: Melrose Nakayama, MD;  Location: [facility];  Service: Thoracic;  Laterality: Left;  . PLEURAL EFFUSION DRAINAGE Left 02/15/2017   Procedure: DRAINAGE OF PLEURAL EFFUSION;  Surgeon: Melrose Nakayama, MD;  Location: [facility];  Service: Thoracic;  Laterality: Left;  . PORTACATH PLACEMENT N/A 03/25/2017   Procedure: INSERTION PORT-A-CATH, right internal jugular;  Surgeon: Melrose Nakayama, MD;  Location: [facility];  Service: Thoracic;  Laterality: N/A;  . THYROIDECTOMY  2001   S/P RAI  . VIDEO ASSISTED THORACOSCOPY Left 02/15/2017   Procedure: VIDEO ASSISTED THORACOSCOPY;  Surgeon: Melrose Nakayama, MD;  Location: [facility];  Service: Thoracic;  Laterality: Left;    There were no vitals filed for this visit.  Subjective Assessment - 02/02/18 1303    Currently in Pain?  Yes    Pain  Score  5  with movement/transitions    Pain Location  Hip    Pain Orientation  Right    Pain Descriptors / Indicators  Dull    Aggravating Factors   laying on it, transitions    Pain Relieving Factors  rest                      [facility] Adult PT Treatment/Exercise - 02/02/18 0001      Lumbar Exercises: Aerobic   Nustep  L5 LE x 5 minutes       Lumbar Exercises: Standing   Wall Slides  15 reps    Other Standing Lumbar Exercises  sit to stand x10 from mat,no UE       Lumbar Exercises: Supine   Clam  20 reps blue band    Clam Limitations  cues for abdominal draw in, unilateral and bilateral     Bridge  20 reps    Bridge with [employer]  20 reps blue    Bridge with clamshell  Limitations    Single Leg Bridge  10 reps    Straight Leg Raise  Limitations;20 reps    Straight Leg Raises Limitations  RT/LT    Other Supine Lumbar Exercises  ball squeeze x10       Knee/Hip Exercises:  Stretches   Active Hamstring Stretch  3 reps;30 seconds strap    Piriformis Stretch  2 reps;30 seconds    Other Knee/Hip Stretches  figure 4 modifies 2 reps 30 sec                PT Short Term Goals - 01/17/18 1329      PT SHORT TERM GOAL #1   Title  --    Time  3    Period  Weeks    Status  New      PT SHORT TERM GOAL #2   Title  --    Time  3    Period  Weeks    Status  New      PT SHORT TERM GOAL #3   Title  --    Time  3    Period  Weeks    Status  New        PT Long Term Goals - 01/17/18 1330      PT LONG TERM GOAL #1   Title  He will report pain decreased 75% and be intermittant    Time  4    Period  Weeks    Status  New      PT LONG TERM GOAL #2   Title  He will be indewpendent with alll HEP issued.     Time  4    Period  Weeks    Status  New      PT LONG TERM GOAL #3   Title  BERG score improve to 54/56    Time  4    Period  Weeks    Status  New      PT LONG TERM GOAL #4   Title  He will report diziness on rising decr 50% or more    Time  4     Period  Weeks    Status  New      PT LONG TERM GOAL #5   Title  Hip strength incr to 4+/5 for improved function ono feet    Time  4    Period  Weeks    Status  New            Plan - 02/02/18 1321    Clinical Impression Statement  Pt reports soreness in quads since last visit. His SLS time is less than previous visits. He reports pain is returning in hip with transitions.     PT Next Visit Plan  review and continue establishing comprehensive HEP for core/gluteal strength, balance.     PT Home Exercise Plan  SLS, tandem stance both at counter, bridge, supine clam green band.     Consulted and Agree with Plan of Care  Patient    Family Member Consulted  daughter       Patient will benefit from skilled therapeutic intervention in order to improve the following deficits and impairments:  Postural dysfunction, Dizziness, Decreased balance, Decreased strength  Visit Diagnosis: Unsteadiness on feet  Muscle weakness (generalized)  Low back pain, unspecified back pain laterality, unspecified chronicity, with sciatica presence unspecified  Abnormal posture     Problem List Patient Active Problem List   Diagnosis Date Noted  . Greater trochanteric bursitis, right 01/27/2018  . Pain of left eye 01/07/2018  . Otalgia, right 12/21/2017  . Bursitis of hip 11/17/2017  . Bilateral low back pain without sciatica 11/17/2017  . Thoracic aortic aneurysm without rupture ([place]) 11/09/2017  . Muscular chest pain  11/09/2017  . Recurrent falls 11/09/2017  . Hypothyroidism 10/13/2017  . B12 deficiency 10/13/2017  . Change in stool 10/13/2017  . Port catheter in place 06/02/2017  . Degenerative arthritis of left knee 03/24/2017  . Goals of care, counseling/discussion 03/20/2017  . Encounter for antineoplastic chemotherapy 03/20/2017  . Reactive depression 03/20/2017  . Malignant mesothelioma of pleura ([place]) 02/19/2017  . History of lung biopsy 02/17/2017  . Pleural effusion  02/15/2017  . Greater trochanteric bursitis of left hip 12/31/2016  . Pleural effusion, left 10/27/2016  . infrarenal abdominal aortic aneurysm without rupture ([place]) - [place] needed 2020 10/22/2016  . Left lumbar radiculopathy 10/12/2016  . Bursitis of left hip 09/30/2016  . Left foot drop 09/30/2016  . Toe pain, left 08/13/2016  . Myalgia 07/04/2016  . Mild cognitive impairment 07/03/2016  . Adjustment disorder with depressed mood 07/03/2016  . Frequency of urination 09/26/2014  . Macular degeneration 06/22/2011  . Fasting hyperglycemia 05/20/2009  . NONSPECIFIC ABNORMAL ELECTROCARDIOGRAM 05/20/2009  . THYROID CANCER, HX OF 05/20/2009  . History of colonic polyps 05/20/2009  . Hyperlipidemia 11/21/2007    Dorene Ar, PTA 02/02/2018, 1:42 PM  [facility] [address] [place], [place], [address] Phone: [phone]   Fax:  [phone]  Name: Dean Pruitt MRN: [id number] Date of Birth: [birth year]

## 2018-02-04 ENCOUNTER — Inpatient Hospital Stay: Payer: PPO | Attending: Internal Medicine

## 2018-02-04 ENCOUNTER — Ambulatory Visit (HOSPITAL_COMMUNITY)
Admission: RE | Admit: 2018-02-04 | Discharge: 2018-02-04 | Disposition: A | Discharge status: Home / Self Care | Payer: PPO | Source: Ambulatory Visit | Attending: Internal Medicine | Admitting: Internal Medicine

## 2018-02-04 ENCOUNTER — Inpatient Hospital Stay: Payer: PPO

## 2018-02-04 ENCOUNTER — Encounter: Payer: Self-pay | Admitting: Physical Therapy

## 2018-02-04 ENCOUNTER — Other Ambulatory Visit: Payer: Self-pay

## 2018-02-04 ENCOUNTER — Ambulatory Visit: Payer: PPO | Attending: Internal Medicine | Admitting: Physical Therapy

## 2018-02-04 DIAGNOSIS — I712 Thoracic aortic aneurysm, without rupture: Secondary | ICD-10-CM | POA: Diagnosis not present

## 2018-02-04 DIAGNOSIS — Z85850 Personal history of malignant neoplasm of thyroid: Secondary | ICD-10-CM | POA: Diagnosis not present

## 2018-02-04 DIAGNOSIS — H8111 Benign paroxysmal vertigo, right ear: Secondary | ICD-10-CM | POA: Diagnosis not present

## 2018-02-04 DIAGNOSIS — Z79899 Other long term (current) drug therapy: Secondary | ICD-10-CM | POA: Insufficient documentation

## 2018-02-04 DIAGNOSIS — I700 Atherosclerosis of aorta: Secondary | ICD-10-CM | POA: Insufficient documentation

## 2018-02-04 DIAGNOSIS — Z7982 Long term (current) use of aspirin: Secondary | ICD-10-CM | POA: Diagnosis not present

## 2018-02-04 DIAGNOSIS — N400 Enlarged prostate without lower urinary tract symptoms: Secondary | ICD-10-CM | POA: Diagnosis not present

## 2018-02-04 DIAGNOSIS — C450 Mesothelioma of pleura: Secondary | ICD-10-CM

## 2018-02-04 DIAGNOSIS — I2510 Atherosclerotic heart disease of native coronary artery without angina pectoris: Secondary | ICD-10-CM | POA: Insufficient documentation

## 2018-02-04 DIAGNOSIS — J90 Pleural effusion, not elsewhere classified: Secondary | ICD-10-CM | POA: Insufficient documentation

## 2018-02-04 DIAGNOSIS — I714 Abdominal aortic aneurysm, without rupture: Secondary | ICD-10-CM | POA: Insufficient documentation

## 2018-02-04 DIAGNOSIS — F0390 Unspecified dementia without behavioral disturbance: Secondary | ICD-10-CM | POA: Insufficient documentation

## 2018-02-04 DIAGNOSIS — Z95828 Presence of other vascular implants and grafts: Secondary | ICD-10-CM

## 2018-02-04 DIAGNOSIS — Z452 Encounter for adjustment and management of vascular access device: Secondary | ICD-10-CM | POA: Diagnosis present

## 2018-02-04 LAB — CBC WITH DIFFERENTIAL/PLATELET
Basophils Absolute: 0.1 10*3/uL (ref 0.0–0.1)
Basophils Relative: 1 %
EOS PCT: 2 %
Eosinophils Absolute: 0.1 10*3/uL (ref 0.0–0.5)
HCT: 45.4 % (ref 38.4–49.9)
Hemoglobin: 14.9 g/dL (ref 13.0–17.1)
LYMPHS ABS: 1.4 10*3/uL (ref 0.9–3.3)
LYMPHS PCT: 24 %
MCH: 32.9 pg (ref 27.2–33.4)
MCHC: 32.8 g/dL (ref 32.0–36.0)
MCV: 100.3 fL — AB (ref 79.3–98.0)
MONO ABS: 0.5 10*3/uL (ref 0.1–0.9)
Monocytes Relative: 9 %
Neutro Abs: 3.9 10*3/uL (ref 1.5–6.5)
Neutrophils Relative %: 64 %
PLATELETS: 156 10*3/uL (ref 140–400)
RBC: 4.52 MIL/uL (ref 4.20–5.82)
RDW: 15.3 % — AB (ref 11.0–14.6)
WBC: 6.1 10*3/uL (ref 4.0–10.3)

## 2018-02-04 LAB — COMPREHENSIVE METABOLIC PANEL
ALBUMIN: 3.8 g/dL (ref 3.5–5.0)
ALT: 25 U/L (ref 0–55)
AST: 28 U/L (ref 5–34)
Alkaline Phosphatase: 74 U/L (ref 40–150)
Anion gap: 9 (ref 3–11)
BILIRUBIN TOTAL: 1.1 mg/dL (ref 0.2–1.2)
BUN: 27 mg/dL — AB (ref 7–26)
CHLORIDE: 105 mmol/L (ref 98–109)
CO2: 23 mmol/L (ref 22–29)
Calcium: 9.2 mg/dL (ref 8.4–10.4)
Creatinine, Ser: 0.98 mg/dL (ref 0.70–1.30)
GFR calc Af Amer: >60 mL/min (ref >60)
GFR calc non Af Amer: >60 mL/min (ref >60)
GLUCOSE: 111 mg/dL (ref 70–140)
POTASSIUM: 4.4 mmol/L (ref 3.5–5.1)
Sodium: 137 mmol/L (ref 136–145)
Total Protein: 6.6 g/dL (ref 6.4–8.3)

## 2018-02-04 LAB — TOTAL PROTEIN, URINE DIPSTICK: Protein, ur: <30 mg/dL — AB

## 2018-02-04 MED ORDER — IOPAMIDOL (ISOVUE-300) INJECTION 61%
INTRAVENOUS | Status: AC
  Filled 2018-02-04: qty 100

## 2018-02-04 MED ORDER — SODIUM CHLORIDE 0.9 % IJ SOLN
INTRAMUSCULAR | Status: AC
  Filled 2018-02-04: qty 50

## 2018-02-04 MED ORDER — IOPAMIDOL (ISOVUE-300) INJECTION 61%
100.0000 mL | Freq: Once | INTRAVENOUS | Status: AC | PRN
  Administered 2018-02-04: 100 mL via INTRAVENOUS

## 2018-02-04 MED ORDER — SODIUM CHLORIDE 0.9% FLUSH
10.0000 mL | Freq: Once | INTRAVENOUS | Status: AC
  Administered 2018-02-04: 10 mL
  Filled 2018-02-04: qty 10

## 2018-02-04 NOTE — Therapy (Signed)
[facility] [address] [place], [place], [address] Phone: [phone]   Fax:  [phone]  Physical Therapy Evaluation  Patient Details  Name: Dean Pruitt MRN: [id number] Date of Birth: February 12, 1934 Referring Provider: [name] MD   Encounter Date: 02/04/2018  PT End of Session - 02/04/18 1605    Visit Number  1 vestibular eval    Number of Visits  1 vestibular eval    Date for PT Re-Evaluation  02/12/18    Authorization Type  MCR    PT Start Time  1453    PT Stop Time  1535    PT Time Calculation (min)  42 min    Activity Tolerance  Patient tolerated treatment well    Behavior During Therapy  [facility] for tasks assessed/performed        Past Medical History:  Diagnosis Date  . Cataract   . Depression    situational  . GERD (gastroesophageal reflux disease)   . Goals of care, counseling/discussion 03/20/2017  . Hyperlipidemia   . Hypothyroidism   . Reactive depression 03/20/2017  . Thyroid cancer ([place])    PMH of; on supressive therapy    Past Surgical History:  Procedure Laterality Date  . CATARACT EXTRACTION, BILATERAL  2013 & 2014   Dr Celene Squibb  . colon polyps  2002 & 2005   negative 2008; Dr Earlean Shawl  . COLONOSCOPY W/ POLYPECTOMY    . LUNG BIOPSY Left 02/15/2017   Procedure: LUNG BIOPSY;  Surgeon: Melrose Nakayama, MD;  Location: [facility];  Service: Thoracic;  Laterality: Left;  . PLEURAL EFFUSION DRAINAGE Left 02/15/2017   Procedure: DRAINAGE OF PLEURAL EFFUSION;  Surgeon: Melrose Nakayama, MD;  Location: [facility];  Service: Thoracic;  Laterality: Left;  . PORTACATH PLACEMENT N/A 03/25/2017   Procedure: INSERTION PORT-A-CATH, right internal jugular;  Surgeon: Melrose Nakayama, MD;  Location: [facility];  Service: Thoracic;  Laterality: N/A;  . THYROIDECTOMY  2001   S/P RAI  . VIDEO ASSISTED THORACOSCOPY Left 02/15/2017   Procedure: VIDEO ASSISTED THORACOSCOPY;  Surgeon: Melrose Nakayama, MD;   Location: [facility];  Service: Thoracic;  Laterality: Left;    There were no vitals filed for this visit.   Subjective Assessment - 02/04/18 1456    Subjective  It's a lot better than it was. If I get out of bed I have to sit there a minute or two and then I have to reach out for things to walk to the bathroom. Sometimes I can get up and go. Looks like things are moving around me.     Currently in Pain?  No/denies feels recent hip injection has worked; denied pain         [facility] PT Assessment - 02/04/18 1501      Assessment   Medical Diagnosis  vertigo    Referring Provider  [name] MD    Prior Therapy  current for back/hip pain and balance      Precautions   Precautions  None;Other (comment)    Precaution Comments  rt chest port-a-cath      Balance Screen   Has the patient fallen in the past 6 months  Yes    How many times?  -- no family present in exam room; pt reports 1 fall backwards     Has the patient had a decrease in activity level because of a fear of falling?   No    Is the patient  reluctant to leave their home because of a fear of falling?   No      Home Environment   Living Environment  Private residence    Living Arrangements  Children    Type of [place] to enter    Entrance Stairs-Number of Steps  1    Entrance Stairs-Rails  None    Home Layout  Two level    Alternate Level Stairs-Number of Steps  12    Alternate Level Stairs-Rails  Right;Left;Can reach both      Prior Function   Level of Independence  Independent    Vocation  Retired      Associate Professor   Overall Cognitive Status  No family/caregiver present to determine baseline cognitive functioning    Memory  Impaired    Memory Impairment  -- could not recall how long married to wife (recently passed);      Observation/Other Assessments   Focus on Therapeutic Outcomes (FOTO)   NP, one visit only         Vestibular Assessment - 02/04/18 0001      Symptom Behavior    Type of Dizziness  "World moves"    Frequency of Dizziness  daily    Duration of Dizziness  1 minute or less    Aggravating Factors  Supine to sit    Relieving Factors  Head stationary      Positional Testing   Dix-Hallpike  Dix-Hallpike Right;Dix-Hallpike Left      Dix-Hallpike Right   Dix-Hallpike Right Duration  5 sec    Dix-Hallpike Right Symptoms  Upbeat, right rotatory nystagmus      Dix-Hallpike Left   Dix-Hallpike Left Duration  0    Dix-Hallpike Left Symptoms  No nystagmus      Orthostatics   Orthostatics Comment  see vital sign section         Objective measurements completed on examination: See above findings.       Vestibular Treatment/Exercise - 02/04/18 0001      Vestibular Treatment/Exercise   Vestibular Treatment Provided  Canalith Repositioning    Canalith Repositioning  Epley Manuever Right       EPLEY MANUEVER RIGHT   Number of Reps   3    Overall Response  Improved Symptoms    Response Details   2nd rep with no nystagmus, but felt 1-2 seconds on 1st and 4th positions; 3rd rep no nystagmus, 1 -2 sec "felt like it was going to start" on 1st position and no symptoms remainder of manuever            PT Education - 02/04/18 1601    Education provided  Yes    Education Details  what BPPV is and how to treat; will keep referral open for him to call and make f/u appt if symptoms persist     Person(s) Educated  Patient;Child(ren)    Methods  Explanation    Comprehension  Verbalized understanding                  Plan - 02/04/18 1607    Clinical Impression Statement  Patient referred for Vestibular PT evaluation due to persistent vertigo. Patient's initial description was a "lightheaded" feeling that only happens when he first moves from supine to sitting each morning. He will sit for "about a minute" and it will clear up but he may feel off balance as he walks to the bathroom. He denied having symptoms  any other time of day or if he were  to lie down and sit back up again later in the day. Orthostatic BPs assessed with initial significant increase in BP moving from supine to sit (and denied vertigo); SBP decreased 14 mmHg sit to stand and remained stable after 3 minutes of standing. At this point pt assessed for BPPV with +right Hallpike-Dix with nystagmus seen. Performed right Epley x 3 reps with pt having no further nystagmus and slight "feeling that something is about to happen" with repeat Hallpike-Dix. Educated patient that I will keep his case open and he can call to schedule a follow-up appointment if he continues to have symptoms of vertigo. No further vestibular treatment scheduled at this time.     Clinical Presentation  Evolving    Clinical Presentation due to:  vertigo has improved but was not resolved prior to vestibular treatment    Clinical Decision Making  Low    Consulted and Agree with Plan of Care  Patient;Family member/caregiver    Family Member Consulted  daughter (present in waiting room at end of session, pt gave permission to discuss with her)       Patient will benefit from skilled therapeutic intervention in order to improve the following deficits and impairments:     Visit Diagnosis: BPPV (benign paroxysmal positional vertigo), right - Plan: PT plan of care cert/re-cert     Problem List Patient Active Problem List   Diagnosis Date Noted  . Greater trochanteric bursitis, right 01/27/2018  . Pain of left eye 01/07/2018  . Otalgia, right 12/21/2017  . Bursitis of hip 11/17/2017  . Bilateral low back pain without sciatica 11/17/2017  . Thoracic aortic aneurysm without rupture ([place]) 11/09/2017  . Muscular chest pain 11/09/2017  . Recurrent falls 11/09/2017  . Hypothyroidism 10/13/2017  . B12 deficiency 10/13/2017  . Change in stool 10/13/2017  . Port catheter in place 06/02/2017  . Degenerative arthritis of left knee 03/24/2017  . Goals of care, counseling/discussion 03/20/2017  . Encounter for  antineoplastic chemotherapy 03/20/2017  . Reactive depression 03/20/2017  . Malignant mesothelioma of pleura ([place]) 02/19/2017  . History of lung biopsy 02/17/2017  . Pleural effusion 02/15/2017  . Greater trochanteric bursitis of left hip 12/31/2016  . Pleural effusion, left 10/27/2016  . infrarenal abdominal aortic aneurysm without rupture ([place]) - [place] needed 2020 10/22/2016  . Left lumbar radiculopathy 10/12/2016  . Bursitis of left hip 09/30/2016  . Left foot drop 09/30/2016  . Toe pain, left 08/13/2016  . Myalgia 07/04/2016  . Mild cognitive impairment 07/03/2016  . Adjustment disorder with depressed mood 07/03/2016  . Frequency of urination 09/26/2014  . Macular degeneration 06/22/2011  . Fasting hyperglycemia 05/20/2009  . NONSPECIFIC ABNORMAL ELECTROCARDIOGRAM 05/20/2009  . THYROID CANCER, HX OF 05/20/2009  . History of colonic polyps 05/20/2009  . Hyperlipidemia 11/21/2007    Rexanne Mano, PT 02/04/2018, 4:27 PM  [facility] [address] [place], [place], [address] Phone: [phone]   Fax:  [phone]  Name: Dean Pruitt MRN: [id number] Date of Birth: May 28, 1934

## 2018-02-05 ENCOUNTER — Encounter (HOSPITAL_COMMUNITY): Payer: Self-pay | Admitting: Emergency Medicine

## 2018-02-05 ENCOUNTER — Emergency Department (HOSPITAL_COMMUNITY)
Admission: EM | Admit: 2018-02-05 | Discharge: 2018-02-05 | Disposition: A | Discharge status: Home / Self Care | Payer: PPO | Attending: Emergency Medicine | Admitting: Emergency Medicine

## 2018-02-05 DIAGNOSIS — Z79899 Other long term (current) drug therapy: Secondary | ICD-10-CM | POA: Diagnosis not present

## 2018-02-05 DIAGNOSIS — Z87891 Personal history of nicotine dependence: Secondary | ICD-10-CM | POA: Diagnosis not present

## 2018-02-05 DIAGNOSIS — Z7982 Long term (current) use of aspirin: Secondary | ICD-10-CM | POA: Diagnosis not present

## 2018-02-05 DIAGNOSIS — E039 Hypothyroidism, unspecified: Secondary | ICD-10-CM | POA: Insufficient documentation

## 2018-02-05 DIAGNOSIS — Z452 Encounter for adjustment and management of vascular access device: Secondary | ICD-10-CM | POA: Diagnosis not present

## 2018-02-05 MED ORDER — HEPARIN SOD (PORK) LOCK FLUSH 100 UNIT/ML IV SOLN
500.0000 [IU] | Freq: Once | INTRAVENOUS | Status: AC
  Administered 2018-02-05: 500 [IU]
  Filled 2018-02-05: qty 5

## 2018-02-05 NOTE — ED Triage Notes (Signed)
Patient reports he was getting scans at the cancer center yesterday and left without getting port deaccessed.

## 2018-02-05 NOTE — Discharge Instructions (Signed)
Please return to the emergency department if you notice over the next 48-72 hours that there is redness spreading around the site, drainage from your port access site, or increasing pain.  Thank you for allowing [place] to participate in your care today.

## 2018-02-06 NOTE — Progress Notes (Signed)
[name] [address], [place] [address] Phone: [phone] Subjective:    I'm seeing this patient by the request  of:    CC: Back pain and right hip pain follow-up  MWN:[id number]  Dean Pruitt is a 84 y.o. male coming in with complaint of neck pain and right hip follow-up.  Patient continues to have right hip pain.  Given another greater trochanteric injection February 11.  Patient was worked up for back pain.  MRI of the lumbar spine show very mild disc and facet arthritic changes from L3 through S1.  Also a large Tarlov cyst.  This was independently visualized by me.      Past Medical History:  Diagnosis Date  . Cataract   . Depression    situational  . GERD (gastroesophageal reflux disease)   . Goals of care, counseling/discussion 03/20/2017  . Hyperlipidemia   . Hypothyroidism   . Reactive depression 03/20/2017  . Thyroid cancer ([place])    PMH of; on supressive therapy   Past Surgical History:  Procedure Laterality Date  . CATARACT EXTRACTION, BILATERAL  2013 & 2014   Dr Celene Squibb  . colon polyps  2002 & 2005   negative 2008; Dr Earlean Shawl  . COLONOSCOPY W/ POLYPECTOMY    . LUNG BIOPSY Left 02/15/2017   Procedure: LUNG BIOPSY;  Surgeon: Melrose Nakayama, MD;  Location: [facility];  Service: Thoracic;  Laterality: Left;  . PLEURAL EFFUSION DRAINAGE Left 02/15/2017   Procedure: DRAINAGE OF PLEURAL EFFUSION;  Surgeon: Melrose Nakayama, MD;  Location: [facility];  Service: Thoracic;  Laterality: Left;  . PORTACATH PLACEMENT N/A 03/25/2017   Procedure: INSERTION PORT-A-CATH, right internal jugular;  Surgeon: Melrose Nakayama, MD;  Location: [facility];  Service: Thoracic;  Laterality: N/A;  . THYROIDECTOMY  2001   S/P RAI  . VIDEO ASSISTED THORACOSCOPY Left 02/15/2017   Procedure: VIDEO ASSISTED THORACOSCOPY;  Surgeon: Melrose Nakayama, MD;  Location: [facility];  Service: Thoracic;  Laterality: Left;   Social History   Socioeconomic  History  . Marital status: Married    Spouse name: None  . Number of children: 2  . Years of education: None  . Highest education level: None  Social Needs  . Financial resource strain: None  . Food insecurity - worry: None  . Food insecurity - inability: None  . Transportation needs - medical: None  . Transportation needs - non-medical: None  Occupational History  . Occupation: Retired  Tobacco Use  . Smoking status: Former Smoker    Packs/day: 1.00    Years: 5.00    Pack years: 5.00    Types: Cigarettes    Last attempt to quit: 12/07/1958    Years since quitting: 59.2  . Smokeless tobacco: Never Used  . Tobacco comment: smoked 1956-1960, up 1 ppd  Substance and Sexual Activity  . Alcohol use: Yes    Alcohol/week: 8.4 oz    Types: 14 Glasses of wine per week  . Drug use: No  . Sexual activity: None  Other Topics Concern  . None  Social History Narrative   Exercise:  Yard work, walks on occasion               Allergies  Allergen Reactions  . Ativan [Lorazepam] Other (See Comments)    Mental status change-"went crazy in the head"  . Tramadol Other (See Comments)    Altered mental status "crazy in the head'  . Fentanyl Other (  See Comments)    hallucinations   Family History  Problem Relation Age of Onset  . Alzheimer's disease Mother   . Hypertension Mother   . Heart disease Father        CAD and angioplasty in 33s  . Cancer Father        Bladder  . Other Brother        valvular heart disease  . Diabetes Neg Hx   . Stroke Neg Hx      Past medical history, social, surgical and family history all reviewed in electronic medical record.  No pertanent information unless stated regarding to the chief complaint.   Review of Systems:Review of systems updated and as accurate as of 02/07/18  No headache, visual changes, nausea, vomiting, diarrhea, constipation, dizziness, abdominal pain, skin rash, fevers, chills, night sweats, weight loss, swollen lymph nodes,  body aches, joint swelling,, chest pain, shortness of breath, mood changes.  Mild positive muscle aches Objective  Blood pressure 140/90, pulse 69, height 5\' 10"  (1.778 m), weight 206 lb (93.4 kg), SpO2 93 %. Systems examined below as of 02/07/18   General: No apparent distress alert and oriented x3 mood and affect normal, dressed appropriately.  HEENT: Pupils equal, extraocular movements intact  Respiratory: Patient's speak in full sentences and does not appear short of breath  Cardiovascular: No lower extremity edema, non tender, no erythema  Skin: Warm dry intact with no signs of infection or rash on extremities or on axial skeleton.  Abdomen: Soft nontender  Neuro: Cranial nerves II through XII are intact, neurovascularly intact in all extremities with 2+ DTRs and 2+ pulses.  Lymph: No lymphadenopathy of posterior or anterior cervical chain or axillae bilaterally.  Gait normal with good balance and coordination.  MSK:  Non tender with full range of motion and good stability and symmetric strength and tone of shoulders, elbows, wrist, hip, knee and ankles bilaterally.  Back exam shows the patient is tender to palpation over the right sacroiliac joint as well as the paraspinal musculature lumbar.  Still tender over the greater trochanteric area as well.  Tightness with Corky Sox test.  Negative straight leg test.  Neurovascularly intact distally.  Full range of motion of the back noted.    Impression and Recommendations:     This case required medical decision making of moderate complexity.      Note: This dictation was prepared with Dragon dictation along with smaller phrase technology. Any transcriptional errors that result from this process are unintentional.

## 2018-02-06 NOTE — ED Provider Notes (Signed)
[facility] DEPT Provider Note   CSN: [id number] Arrival date & time: 02/05/18  1555     History   Chief Complaint Chief Complaint  Patient presents with  . needs port deaccessed    HPI Dean Pruitt is a 84 y.o. male.  HPI  Patient is an 84 year old male medical history as below, and history of treatment for mesothelioma presenting for assistance in the accessing his Port-A-Cath.  Patient was at his oncologist office yesterday receiving blood work as well as a CT scan, and reports that he left with the  port still accessed.  Sterile dressing remain in place until today when patient removed it.  Patient denies any medications being flushed through the port, redness, or drainage from the site.  No pain.  Past Medical History:  Diagnosis Date  . Cataract   . Depression    situational  . GERD (gastroesophageal reflux disease)   . Goals of care, counseling/discussion 03/20/2017  . Hyperlipidemia   . Hypothyroidism   . Reactive depression 03/20/2017  . Thyroid cancer [facility])    PMH of; on supressive therapy    Patient Active Problem List   Diagnosis Date Noted  . Greater trochanteric bursitis, right 01/27/2018  . Pain of left eye 01/07/2018  . Otalgia, right 12/21/2017  . Bursitis of hip 11/17/2017  . Bilateral low back pain without sciatica 11/17/2017  . Thoracic aortic aneurysm without rupture ([place]) 11/09/2017  . Muscular chest pain 11/09/2017  . Recurrent falls 11/09/2017  . Hypothyroidism 10/13/2017  . B12 deficiency 10/13/2017  . Change in stool 10/13/2017  . Port catheter in place 06/02/2017  . Degenerative arthritis of left knee 03/24/2017  . Goals of care, counseling/discussion 03/20/2017  . Encounter for antineoplastic chemotherapy 03/20/2017  . Reactive depression 03/20/2017  . Malignant mesothelioma of pleura ([place]) 02/19/2017  . History of lung biopsy 02/17/2017  . Pleural effusion 02/15/2017  . Greater trochanteric  bursitis of left hip 12/31/2016  . Pleural effusion, left 10/27/2016  . infrarenal abdominal aortic aneurysm without rupture ([place]) - [place] needed 2020 10/22/2016  . Left lumbar radiculopathy 10/12/2016  . Bursitis of left hip 09/30/2016  . Left foot drop 09/30/2016  . Toe pain, left 08/13/2016  . Myalgia 07/04/2016  . Mild cognitive impairment 07/03/2016  . Adjustment disorder with depressed mood 07/03/2016  . Frequency of urination 09/26/2014  . Macular degeneration 06/22/2011  . Fasting hyperglycemia 05/20/2009  . NONSPECIFIC ABNORMAL ELECTROCARDIOGRAM 05/20/2009  . THYROID CANCER, HX OF 05/20/2009  . History of colonic polyps 05/20/2009  . Hyperlipidemia 11/21/2007    Past Surgical History:  Procedure Laterality Date  . CATARACT EXTRACTION, BILATERAL  2013 & 2014   Dr Celene Squibb  . colon polyps  2002 & 2005   negative 2008; Dr Earlean Shawl  . COLONOSCOPY W/ POLYPECTOMY    . LUNG BIOPSY Left 02/15/2017   Procedure: LUNG BIOPSY;  Surgeon: Melrose Nakayama, MD;  Location: [facility];  Service: Thoracic;  Laterality: Left;  . PLEURAL EFFUSION DRAINAGE Left 02/15/2017   Procedure: DRAINAGE OF PLEURAL EFFUSION;  Surgeon: Melrose Nakayama, MD;  Location: [facility];  Service: Thoracic;  Laterality: Left;  . PORTACATH PLACEMENT N/A 03/25/2017   Procedure: INSERTION PORT-A-CATH, right internal jugular;  Surgeon: Melrose Nakayama, MD;  Location: [facility];  Service: Thoracic;  Laterality: N/A;  . THYROIDECTOMY  2001   S/P RAI  . VIDEO ASSISTED THORACOSCOPY Left 02/15/2017   Procedure: VIDEO ASSISTED THORACOSCOPY;  Surgeon: Melrose Nakayama, MD;  [facility];  Service: Thoracic;  Laterality: Left;       Home Medications    Prior to Admission medications   Medication Sig Start Date End Date Taking? Authorizing Provider  aspirin 81 MG tablet Take 81 mg by mouth daily.    [provider]  donepezil (ARICEPT) 10 MG tablet Take 1 tablet (10 mg total) by mouth at bedtime. 12/02/17    Tomi Likens, Adam R, DO  escitalopram (LEXAPRO) 20 MG tablet Take 1 tablet (20 mg total) by mouth at bedtime. 12/10/17   Tomi Likens, Adam R, DO  gabapentin (NEURONTIN) 300 MG capsule TAKE ONE CAPSULE BY MOUTH AT NIGHT 11/09/17   Burns, Claudina Lick, MD  levothyroxine (SYNTHROID, LEVOTHROID) 150 MCG tablet TAKE 1 TABLET BY MOUTH ONCE DAILY MON- FRI AND 1/2 TABLET ON SAT & SUN 10/25/17   [name], MD  lidocaine-prilocaine (EMLA) cream Apply 1 application topically as needed. Patient taking differently: Apply 1 application topically as needed (port access).  03/19/17   Curt Bears, MD  meloxicam (MOBIC) 7.5 MG tablet Take 1 tablet (7.5 mg total) by mouth daily. 12/30/17   Lyndal Pulley, DO  omeprazole (PRILOSEC) 20 MG capsule Take 1 capsule (20 mg total) by mouth daily. 01/04/18   [name], MD  oxyCODONE (OXY IR/ROXICODONE) 5 MG immediate release tablet Take 1 tablet (5 mg total) by mouth every 6 (six) hours as needed for severe pain. 11/09/17   [name], MD  Probiotic Product (PROBIOTIC PO) Take 1 tablet by mouth 2 (two) times daily.     [provider]  rosuvastatin (CRESTOR) 20 MG tablet Take 1 tablet (20 mg total) by mouth daily. 11/09/17   [name], MD    Family History Family History  Problem Relation Age of Onset  . Alzheimer's disease Mother   . Hypertension Mother   . Heart disease Father        CAD and angioplasty in 94s  . Cancer Father        Bladder  . Other Brother        valvular heart disease  . Diabetes Neg Hx   . Stroke Neg Hx     Social History Social History   Tobacco Use  . Smoking status: Former Smoker    Packs/day: 1.00    Years: 5.00    Pack years: 5.00    Types: Cigarettes    Last attempt to quit: 12/07/1958    Years since quitting: 59.2  . Smokeless tobacco: Never Used  . Tobacco comment: smoked 1956-1960, up 1 ppd  Substance Use Topics  . Alcohol use: Yes    Alcohol/week: 8.4 oz    Types: 14 Glasses of wine per week  . Drug use: No       Allergies   Ativan [lorazepam]; Tramadol; and Fentanyl   Review of Systems Review of Systems  Cardiovascular: Negative for chest pain.  Skin: Negative for color change.     Physical Exam Updated Vital Signs BP 123/86 (BP Location: Left Arm)   Pulse 81   Temp 98.2 F (36.8 C) (Oral)   Resp 18   SpO2 92%   Physical Exam  Constitutional: He appears well-developed and well-nourished. No distress.  Sitting comfortably in bed.  HENT:  Head: Normocephalic and atraumatic.  Eyes: Conjunctivae are normal. Right eye exhibits no discharge. Left eye exhibits no discharge.  EOMs normal to gross examination.  Neck: Normal range of motion.  Cardiovascular: Normal rate and  regular rhythm.  Pulmonary/Chest: Effort normal and breath sounds normal.  Normal respiratory effort. Patient converses comfortably. No audible wheeze or stridor.  Abdominal: He exhibits no distension.  Musculoskeletal: Normal range of motion.  Neurological: He is alert.  Cranial nerves intact to gross observation. Patient moves extremities without difficulty.  Skin: Skin is warm and dry. He is not diaphoretic.  Area around accessed port is clean and dry.  No drainage.  No bleeding.  No ecchymosis.  Psychiatric: He has a normal mood and affect. His behavior is normal. Judgment and thought content normal.  Nursing note and vitals reviewed.    ED Treatments / Results  Labs (all labs ordered are listed, but only abnormal results are displayed) Labs Reviewed - No data to display  EKG  EKG Interpretation None       Radiology Ct Chest W Contrast  Result Date: 02/04/2018 CLINICAL DATA:  Followup malignant pleural mesothelioma. EXAM: CT CHEST, ABDOMEN, AND PELVIS WITH CONTRAST TECHNIQUE: Multidetector CT imaging of the chest, abdomen and pelvis was performed following the standard protocol during bolus administration of intravenous contrast. CONTRAST:  112mL ISOVUE-300 IOPAMIDOL (ISOVUE-300) INJECTION 61%  COMPARISON:  11/04/2017. FINDINGS: CT CHEST FINDINGS Cardiovascular: Normal heart size. No pericardial effusion. Aortic atherosclerosis noted. The ascending thoracic aorta measures 4.3 cm, image 29/3. Calcification in the left main coronary artery, LAD, left circumflex and RCA noted. Mediastinum/Nodes: Thyroidectomy. The trachea appears patent and is midline. Normal appearance of the esophagus. Right paratracheal lymph node is stable at 1 cm, image 23/3. Lungs/Pleura: No pleural effusion. Interval resolution of small right effusion and posterior right lung base atelectasis. Mild pleural thickening along the oblique fissure and over the medial left lower lobe appears improved from previous exam. Small nodule within the left lower lobe measuring 3 mm, image 117/7. Musculoskeletal: Degenerative disc disease identified within the thoracic spine. No suspicious bone lesions. CT ABDOMEN PELVIS FINDINGS Hepatobiliary: No focal liver abnormality is seen. No gallstones, gallbladder wall thickening, or biliary dilatation. Pancreas: Unremarkable. No pancreatic ductal dilatation or surrounding inflammatory changes. Spleen: Normal in size without focal abnormality. Adrenals/Urinary Tract: The adrenal glands appear normal. Tiny stone within the lower pole of left kidney is unchanged. Right kidney appears normal. No mass or hydronephrosis. Urinary bladder appears normal. Stomach/Bowel: Stomach unremarkable. A no dilatation of the small bowel loops. The appendix is visualized and appears normal. Multiple colonic diverticula identified distally without acute inflammation. Vascular/Lymphatic: Aortic atherosclerosis. The infrarenal abdominal aorta measures 3.2 cm AP diameter, image 81/3. No adenopathy within the abdomen or pelvis. Reproductive: Prostate gland measures 5.5 by 4.9 by 4.9 cm (volume = 69 cm^3). Other: None Musculoskeletal: Degenerative disc disease within the lower thoracic and lumbar spine. No suspicious bone lesions.  IMPRESSION: 1. Further improvement in pleural thickening within the left hemithorax compared with 11/04/2017. No new or progressive disease identified. 2. Resolution of right lower lobe pleural effusion and atelectasis. 3. Aneurysmal dilatation of the ascending thoracic aorta. Recommend annual imaging followup by CTA or MRA. This recommendation follows 2010 ACCF/AHA/AATS/ACR/ASA/SCA/SCAI/SIR/STS/SVM Guidelines for the Diagnosis and Management of Patients with Thoracic Aortic Disease. Circulation. 2010; 121: K093-G182 4. Infrarenal abdominal aortic aneurysm measures 3.2 cm. Recommend followup by ultrasound in 3 years. This recommendation follows ACR consensus guidelines: White Paper of the ACR Incidental Findings Committee II on Vascular Findings. J Am Coll Radiol 2013; 99:371-696 5.  Aortic Atherosclerosis (ICD10-I70.0). 6. Atherosclerotic calcifications within the left main coronary artery, LAD, left circumflex, RCA. 7. Prostate gland enlargement. Electronically Signed   By:  Kerby Moors M.D.   On: 02/04/2018 15:08   Ct Abdomen Pelvis W Contrast  Result Date: 02/04/2018 CLINICAL DATA:  Followup malignant pleural mesothelioma. EXAM: CT CHEST, ABDOMEN, AND PELVIS WITH CONTRAST TECHNIQUE: Multidetector CT imaging of the chest, abdomen and pelvis was performed following the standard protocol during bolus administration of intravenous contrast. CONTRAST:  166mL ISOVUE-300 IOPAMIDOL (ISOVUE-300) INJECTION 61% COMPARISON:  11/04/2017. FINDINGS: CT CHEST FINDINGS Cardiovascular: Normal heart size. No pericardial effusion. Aortic atherosclerosis noted. The ascending thoracic aorta measures 4.3 cm, image 29/3. Calcification in the left main coronary artery, LAD, left circumflex and RCA noted. Mediastinum/Nodes: Thyroidectomy. The trachea appears patent and is midline. Normal appearance of the esophagus. Right paratracheal lymph node is stable at 1 cm, image 23/3. Lungs/Pleura: No pleural effusion. Interval resolution of  small right effusion and posterior right lung base atelectasis. Mild pleural thickening along the oblique fissure and over the medial left lower lobe appears improved from previous exam. Small nodule within the left lower lobe measuring 3 mm, image 117/7. Musculoskeletal: Degenerative disc disease identified within the thoracic spine. No suspicious bone lesions. CT ABDOMEN PELVIS FINDINGS Hepatobiliary: No focal liver abnormality is seen. No gallstones, gallbladder wall thickening, or biliary dilatation. Pancreas: Unremarkable. No pancreatic ductal dilatation or surrounding inflammatory changes. Spleen: Normal in size without focal abnormality. Adrenals/Urinary Tract: The adrenal glands appear normal. Tiny stone within the lower pole of left kidney is unchanged. Right kidney appears normal. No mass or hydronephrosis. Urinary bladder appears normal. Stomach/Bowel: Stomach unremarkable. A no dilatation of the small bowel loops. The appendix is visualized and appears normal. Multiple colonic diverticula identified distally without acute inflammation. Vascular/Lymphatic: Aortic atherosclerosis. The infrarenal abdominal aorta measures 3.2 cm AP diameter, image 81/3. No adenopathy within the abdomen or pelvis. Reproductive: Prostate gland measures 5.5 by 4.9 by 4.9 cm (volume = 69 cm^3). Other: None Musculoskeletal: Degenerative disc disease within the lower thoracic and lumbar spine. No suspicious bone lesions. IMPRESSION: 1. Further improvement in pleural thickening within the left hemithorax compared with 11/04/2017. No new or progressive disease identified. 2. Resolution of right lower lobe pleural effusion and atelectasis. 3. Aneurysmal dilatation of the ascending thoracic aorta. Recommend annual imaging followup by CTA or MRA. This recommendation follows 2010 ACCF/AHA/AATS/ACR/ASA/SCA/SCAI/SIR/STS/SVM Guidelines for the Diagnosis and Management of Patients with Thoracic Aortic Disease. Circulation. 2010; 121:  Y073-X106 4. Infrarenal abdominal aortic aneurysm measures 3.2 cm. Recommend followup by ultrasound in 3 years. This recommendation follows ACR consensus guidelines: White Paper of the ACR Incidental Findings Committee II on Vascular Findings. J Am Coll Radiol 2013; 26:948-546 5.  Aortic Atherosclerosis (ICD10-I70.0). 6. Atherosclerotic calcifications within the left main coronary artery, LAD, left circumflex, RCA. 7. Prostate gland enlargement. Electronically Signed   By: Kerby Moors M.D.   On: 02/04/2018 15:08    Procedures Procedures (including critical care time)  Medications Ordered in ED Medications  heparin lock flush 100 unit/mL (500 Units Intracatheter Given 02/05/18 1702)     Initial Impression / Assessment and Plan / ED Course  I have reviewed the triage vital signs and the nursing notes.  Pertinent labs & imaging results that were available during my care of the patient were reviewed by me and considered in my medical decision making (see chart for details).     Patient is well-appearing and in no acute distress.  Discussed with nursing best tactics for care of the accessing Port-A-Cath.  Access was removed after heparin flush.  There is no evidence of infection around the site.  Patient was given strict return precautions for any erythema or drainage of the site.  Patient is following up with the clinic on Monday regarding the results of his testing, and site will be reassessed at that time.  Final Clinical Impressions(s) / ED Diagnoses   Final diagnoses:  Encounter for care related to vascular access port    ED Discharge Orders    None       Tamala Julian 02/06/18 0038    Quintella Reichert, MD 02/06/18 [phone]

## 2018-02-07 ENCOUNTER — Encounter: Payer: Self-pay | Admitting: Internal Medicine

## 2018-02-07 ENCOUNTER — Encounter: Payer: Self-pay | Admitting: Family Medicine

## 2018-02-07 ENCOUNTER — Inpatient Hospital Stay (HOSPITAL_BASED_OUTPATIENT_CLINIC_OR_DEPARTMENT_OTHER): Payer: PPO | Admitting: Internal Medicine

## 2018-02-07 ENCOUNTER — Ambulatory Visit: Payer: PPO | Attending: Family Medicine

## 2018-02-07 ENCOUNTER — Ambulatory Visit (INDEPENDENT_AMBULATORY_CARE_PROVIDER_SITE_OTHER): Payer: PPO | Admitting: Family Medicine

## 2018-02-07 ENCOUNTER — Telehealth: Payer: Self-pay | Admitting: Internal Medicine

## 2018-02-07 ENCOUNTER — Other Ambulatory Visit: Payer: Self-pay

## 2018-02-07 VITALS — BP 133/71 | HR 71 | Temp 99.3°F | Resp 18 | Ht 70.0 in | Wt 208.6 lb

## 2018-02-07 VITALS — BP 140/90 | HR 69 | Ht 70.0 in | Wt 206.0 lb

## 2018-02-07 DIAGNOSIS — C450 Mesothelioma of pleura: Secondary | ICD-10-CM | POA: Diagnosis not present

## 2018-02-07 DIAGNOSIS — Z85850 Personal history of malignant neoplasm of thyroid: Secondary | ICD-10-CM | POA: Diagnosis not present

## 2018-02-07 DIAGNOSIS — M549 Dorsalgia, unspecified: Secondary | ICD-10-CM

## 2018-02-07 DIAGNOSIS — R293 Abnormal posture: Secondary | ICD-10-CM | POA: Diagnosis not present

## 2018-02-07 DIAGNOSIS — Z79899 Other long term (current) drug therapy: Secondary | ICD-10-CM | POA: Diagnosis not present

## 2018-02-07 DIAGNOSIS — M6281 Muscle weakness (generalized): Secondary | ICD-10-CM | POA: Diagnosis not present

## 2018-02-07 DIAGNOSIS — J90 Pleural effusion, not elsewhere classified: Secondary | ICD-10-CM | POA: Diagnosis not present

## 2018-02-07 DIAGNOSIS — Z7982 Long term (current) use of aspirin: Secondary | ICD-10-CM

## 2018-02-07 DIAGNOSIS — M545 Low back pain, unspecified: Secondary | ICD-10-CM

## 2018-02-07 DIAGNOSIS — F0390 Unspecified dementia without behavioral disturbance: Secondary | ICD-10-CM | POA: Diagnosis not present

## 2018-02-07 DIAGNOSIS — R2681 Unsteadiness on feet: Secondary | ICD-10-CM | POA: Insufficient documentation

## 2018-02-07 NOTE — Progress Notes (Signed)
Dean Pruitt [phone]   [phone]  OFFICE PROGRESS NOTE  [name], MD [place] [address]  DIAGNOSIS: Stage II malignant pleural mesothelioma, epithelioid type involving the left hemithorax diagnosed in March 2018.  PRIOR THERAPY:  1) Status post left VATS with biopsy and wedge resection of the left lower lobe as well as parietal pleurectomy under the care of Dr. Roxan Hockey on 02/15/2017. 2) Palliative systemic chemotherapy with carboplatin for AUC of 5, Alimta 500 MG/M2 and Avastin 15 MG/KG every 3 weeks. Status post 6 cycles.  CURRENT THERAPY: Observation.  INTERVAL HISTORY: [place] 84 y.o. male returns to the clinic today for follow-up visit accompanied by his daughter and her husband.  The patient is feeling fine today with no specific complaints except for dementia.  Recent chest pain, shortness of breath, cough or hemoptysis.  He denied having any weight loss or night sweats.  He has no nausea, vomiting, diarrhea or constipation.  He is currently on observation.  He had repeat CT scan of the chest, abdomen and pelvis performed recently and he is here for evaluation and discussion of his discuss results.  MEDICAL HISTORY: Past Medical History:  Diagnosis Date  . Cataract   . Depression    situational  . GERD (gastroesophageal reflux disease)   . Goals of care, counseling/discussion 03/20/2017  . Hyperlipidemia   . Hypothyroidism   . Reactive depression 03/20/2017  . Thyroid cancer ([place])    PMH of; on supressive therapy    ALLERGIES:  is allergic to ativan [lorazepam]; tramadol; and fentanyl.  MEDICATIONS:  Current Outpatient Medications  Medication Sig Dispense Refill  . aspirin 81 MG tablet Take 81 mg by mouth daily.    Marland Kitchen donepezil (ARICEPT) 10 MG tablet Take 1 tablet (10 mg total) by mouth at bedtime. 30 tablet 2  . escitalopram (LEXAPRO) 20 MG tablet Take 1 tablet (20 mg total) by mouth at  bedtime. 30 tablet 2  . gabapentin (NEURONTIN) 300 MG capsule TAKE ONE CAPSULE BY MOUTH AT NIGHT 90 capsule 3  . levothyroxine (SYNTHROID, LEVOTHROID) 150 MCG tablet TAKE 1 TABLET BY MOUTH ONCE DAILY MON- FRI AND 1/2 TABLET ON SAT & SUN 90 tablet 1  . lidocaine-prilocaine (EMLA) cream Apply 1 application topically as needed. (Patient taking differently: Apply 1 application topically as needed (port access). ) 30 g 0  . meloxicam (MOBIC) 7.5 MG tablet Take 1 tablet (7.5 mg total) by mouth daily. 30 tablet 3  . omeprazole (PRILOSEC) 20 MG capsule Take 1 capsule (20 mg total) by mouth daily. 90 capsule 1  . oxyCODONE (OXY IR/ROXICODONE) 5 MG immediate release tablet Take 1 tablet (5 mg total) by mouth every 6 (six) hours as needed for severe pain. 30 tablet 0  . Probiotic Product (PROBIOTIC PO) Take 1 tablet by mouth 2 (two) times daily.     . rosuvastatin (CRESTOR) 20 MG tablet Take 1 tablet (20 mg total) by mouth daily. 90 tablet 3   No current facility-administered medications for this visit.     SURGICAL HISTORY:  Past Surgical History:  Procedure Laterality Date  . CATARACT EXTRACTION, BILATERAL  2013 & 2014   Dr Celene Squibb  . colon polyps  2002 & 2005   negative 2008; Dr Earlean Shawl  . COLONOSCOPY W/ POLYPECTOMY    . LUNG BIOPSY Left 02/15/2017   Procedure: LUNG BIOPSY;  Surgeon: Melrose Nakayama, MD;  Location: [facility];  Service:  Thoracic;  Laterality: Left;  . PLEURAL EFFUSION DRAINAGE Left 02/15/2017   Procedure: DRAINAGE OF PLEURAL EFFUSION;  Surgeon: Melrose Nakayama, MD;  Location: [facility];  Service: Thoracic;  Laterality: Left;  . PORTACATH PLACEMENT N/A 03/25/2017   Procedure: INSERTION PORT-A-CATH, right internal jugular;  Surgeon: Melrose Nakayama, MD;  Location: [facility];  Service: Thoracic;  Laterality: N/A;  . THYROIDECTOMY  2001   S/P RAI  . VIDEO ASSISTED THORACOSCOPY Left 02/15/2017   Procedure: VIDEO ASSISTED THORACOSCOPY;  Surgeon: Melrose Nakayama, MD;  Location: [facility];  Service: Thoracic;  Laterality: Left;    REVIEW OF SYSTEMS:  A comprehensive review of systems was negative except for: Neurological: positive for memory problems   PHYSICAL EXAMINATION: General appearance: alert, cooperative and no distress Head: Normocephalic, without obvious abnormality, atraumatic Neck: no adenopathy, no JVD, supple, symmetrical, trachea midline and thyroid not enlarged, symmetric, no tenderness/mass/nodules Lymph nodes: Cervical, supraclavicular, and axillary nodes normal. Resp: clear to auscultation bilaterally Back: symmetric, no curvature. ROM normal. No CVA tenderness. Cardio: regular rate and rhythm, S1, S2 normal, no murmur, click, rub or gallop GI: soft, non-tender; bowel sounds normal; no masses,  no organomegaly Extremities: extremities normal, atraumatic, no cyanosis or edema  ECOG PERFORMANCE STATUS: 1 - Symptomatic but completely ambulatory  Blood pressure 133/71, pulse 71, temperature 99.3 F (37.4 C), temperature source Oral, resp. rate 18, height 5\' 10"  (1.778 m), weight 208 lb 9.6 oz (94.6 kg), SpO2 96 %.  LABORATORY DATA: Lab Results  Component Value Date   WBC 6.1 02/04/2018   HGB 14.9 02/04/2018   HCT 45.4 02/04/2018   MCV 100.3 (H) 02/04/2018   PLT 156 02/04/2018      Chemistry      Component Value Date/Time   NA 137 02/04/2018 1108   NA 140 11/04/2017 1210   K 4.4 02/04/2018 1108   K 4.1 11/04/2017 1210   CL 105 02/04/2018 1108   CO2 23 02/04/2018 1108   CO2 26 11/04/2017 1210   BUN 27 (H) 02/04/2018 1108   BUN 20.1 11/04/2017 1210   CREATININE 0.98 02/04/2018 1108   CREATININE 1.2 11/04/2017 1210      Component Value Date/Time   CALCIUM 9.2 02/04/2018 1108   CALCIUM 9.6 11/04/2017 1210   ALKPHOS 74 02/04/2018 1108   ALKPHOS 62 11/04/2017 1210   AST 28 02/04/2018 1108   AST 37 (H) 11/04/2017 1210   ALT 25 02/04/2018 1108   ALT 22 11/04/2017 1210   BILITOT 1.1 02/04/2018 1108   BILITOT 1.32 (H) 11/04/2017 1210        RADIOGRAPHIC STUDIES: Ct Chest W Contrast  Result Date: 02/04/2018 CLINICAL DATA:  Followup malignant pleural mesothelioma. EXAM: CT CHEST, ABDOMEN, AND PELVIS WITH CONTRAST TECHNIQUE: Multidetector CT imaging of the chest, abdomen and pelvis was performed following the standard protocol during bolus administration of intravenous contrast. CONTRAST:  19mL ISOVUE-300 IOPAMIDOL (ISOVUE-300) INJECTION 61% COMPARISON:  11/04/2017. FINDINGS: CT CHEST FINDINGS Cardiovascular: Normal heart size. No pericardial effusion. Aortic atherosclerosis noted. The ascending thoracic aorta measures 4.3 cm, image 29/3. Calcification in the left main coronary artery, LAD, left circumflex and RCA noted. Mediastinum/Nodes: Thyroidectomy. The trachea appears patent and is midline. Normal appearance of the esophagus. Right paratracheal lymph node is stable at 1 cm, image 23/3. Lungs/Pleura: No pleural effusion. Interval resolution of small right effusion and posterior right lung base atelectasis. Mild pleural thickening along the oblique fissure and over the medial left lower lobe appears improved from previous  exam. Small nodule within the left lower lobe measuring 3 mm, image 117/7. Musculoskeletal: Degenerative disc disease identified within the thoracic spine. No suspicious bone lesions. CT ABDOMEN PELVIS FINDINGS Hepatobiliary: No focal liver abnormality is seen. No gallstones, gallbladder wall thickening, or biliary dilatation. Pancreas: Unremarkable. No pancreatic ductal dilatation or surrounding inflammatory changes. Spleen: Normal in size without focal abnormality. Adrenals/Urinary Tract: The adrenal glands appear normal. Tiny stone within the lower pole of left kidney is unchanged. Right kidney appears normal. No mass or hydronephrosis. Urinary bladder appears normal. Stomach/Bowel: Stomach unremarkable. A no dilatation of the small bowel loops. The appendix is visualized and appears normal. Multiple colonic diverticula  identified distally without acute inflammation. Vascular/Lymphatic: Aortic atherosclerosis. The infrarenal abdominal aorta measures 3.2 cm AP diameter, image 81/3. No adenopathy within the abdomen or pelvis. Reproductive: Prostate gland measures 5.5 by 4.9 by 4.9 cm (volume = 69 cm^3). Other: None Musculoskeletal: Degenerative disc disease within the lower thoracic and lumbar spine. No suspicious bone lesions. IMPRESSION: 1. Further improvement in pleural thickening within the left hemithorax compared with 11/04/2017. No new or progressive disease identified. 2. Resolution of right lower lobe pleural effusion and atelectasis. 3. Aneurysmal dilatation of the ascending thoracic aorta. Recommend annual imaging followup by CTA or MRA. This recommendation follows 2010 ACCF/AHA/AATS/ACR/ASA/SCA/SCAI/SIR/STS/SVM Guidelines for the Diagnosis and Management of Patients with Thoracic Aortic Disease. Circulation. 2010; 121: T557-D220 4. Infrarenal abdominal aortic aneurysm measures 3.2 cm. Recommend followup by ultrasound in 3 years. This recommendation follows ACR consensus guidelines: White Paper of the ACR Incidental Findings Committee II on Vascular Findings. J Am Coll Radiol 2013; 25:427-062 5.  Aortic Atherosclerosis (ICD10-I70.0). 6. Atherosclerotic calcifications within the left main coronary artery, LAD, left circumflex, RCA. 7. Prostate gland enlargement. Electronically Signed   By: Kerby Moors M.D.   On: 02/04/2018 15:08   Mr Lumbar Spine Wo Contrast  Result Date: 01/23/2018 CLINICAL DATA:  Lumbar spinal stenosis. Back pain with right hip and buttock pain. History of lung cancer EXAM: MRI LUMBAR SPINE WITHOUT CONTRAST TECHNIQUE: Multiplanar, multisequence MR imaging of the lumbar spine was performed. No intravenous contrast was administered. COMPARISON:  Lumbar radiographs 12/13/2017, MRI 11/09/2016 FINDINGS: Segmentation:  Normal Alignment:  Mild retrolisthesis L3-4. Vertebrae:  Normal bone marrow.  Negative  for fracture or mass. Conus medullaris and cauda equina: Conus extends to the L1-2 level. Conus and cauda equina appear normal. Paraspinal and other soft tissues: Atherosclerotic disease in the abdominal aorta measuring up to 31 x 34 mm. No retroperitoneal mass Disc levels: L1-2: Negative L2-3: Negative L3-4: Mild disc and facet degeneration. Mild retrolisthesis. Mild vertebral spurring without significant stenosis. L4-5: Mild disc and mild facet degeneration without significant stenosis L5-S1: Mild disc and mild facet degeneration without significant stenosis. Large Tarlov cyst in the lower sacral canal with a benign appearance. This is unchanged from the prior study. IMPRESSION: Mild degenerative changes in the lumbar spine. No focal disc protrusion or spinal stenosis. Abdominal aortic aneurysm 31 x 34 mm stable from the prior MRI. Large Tarlov cyst in the sacral canal unchanged. Electronically Signed   By: Franchot Gallo M.D.   On: 01/23/2018 06:35   Ct Abdomen Pelvis W Contrast  Result Date: 02/04/2018 CLINICAL DATA:  Followup malignant pleural mesothelioma. EXAM: CT CHEST, ABDOMEN, AND PELVIS WITH CONTRAST TECHNIQUE: Multidetector CT imaging of the chest, abdomen and pelvis was performed following the standard protocol during bolus administration of intravenous contrast. CONTRAST:  145mL ISOVUE-300 IOPAMIDOL (ISOVUE-300) INJECTION 61% COMPARISON:  11/04/2017. FINDINGS: CT CHEST  FINDINGS Cardiovascular: Normal heart size. No pericardial effusion. Aortic atherosclerosis noted. The ascending thoracic aorta measures 4.3 cm, image 29/3. Calcification in the left main coronary artery, LAD, left circumflex and RCA noted. Mediastinum/Nodes: Thyroidectomy. The trachea appears patent and is midline. Normal appearance of the esophagus. Right paratracheal lymph node is stable at 1 cm, image 23/3. Lungs/Pleura: No pleural effusion. Interval resolution of small right effusion and posterior right lung base atelectasis.  Mild pleural thickening along the oblique fissure and over the medial left lower lobe appears improved from previous exam. Small nodule within the left lower lobe measuring 3 mm, image 117/7. Musculoskeletal: Degenerative disc disease identified within the thoracic spine. No suspicious bone lesions. CT ABDOMEN PELVIS FINDINGS Hepatobiliary: No focal liver abnormality is seen. No gallstones, gallbladder wall thickening, or biliary dilatation. Pancreas: Unremarkable. No pancreatic ductal dilatation or surrounding inflammatory changes. Spleen: Normal in size without focal abnormality. Adrenals/Urinary Tract: The adrenal glands appear normal. Tiny stone within the lower pole of left kidney is unchanged. Right kidney appears normal. No mass or hydronephrosis. Urinary bladder appears normal. Stomach/Bowel: Stomach unremarkable. A no dilatation of the small bowel loops. The appendix is visualized and appears normal. Multiple colonic diverticula identified distally without acute inflammation. Vascular/Lymphatic: Aortic atherosclerosis. The infrarenal abdominal aorta measures 3.2 cm AP diameter, image 81/3. No adenopathy within the abdomen or pelvis. Reproductive: Prostate gland measures 5.5 by 4.9 by 4.9 cm (volume = 69 cm^3). Other: None Musculoskeletal: Degenerative disc disease within the lower thoracic and lumbar spine. No suspicious bone lesions. IMPRESSION: 1. Further improvement in pleural thickening within the left hemithorax compared with 11/04/2017. No new or progressive disease identified. 2. Resolution of right lower lobe pleural effusion and atelectasis. 3. Aneurysmal dilatation of the ascending thoracic aorta. Recommend annual imaging followup by CTA or MRA. This recommendation follows 2010 ACCF/AHA/AATS/ACR/ASA/SCA/SCAI/SIR/STS/SVM Guidelines for the Diagnosis and Management of Patients with Thoracic Aortic Disease. Circulation. 2010; 121: P619-J093 4. Infrarenal abdominal aortic aneurysm measures 3.2 cm.  Recommend followup by ultrasound in 3 years. This recommendation follows ACR consensus guidelines: White Paper of the ACR Incidental Findings Committee II on Vascular Findings. J Am Coll Radiol 2013; 26:712-458 5.  Aortic Atherosclerosis (ICD10-I70.0). 6. Atherosclerotic calcifications within the left main coronary artery, LAD, left circumflex, RCA. 7. Prostate gland enlargement. Electronically Signed   By: Kerby Moors M.D.   On: 02/04/2018 15:08    ASSESSMENT AND PLAN:  This is a very pleasant 84 years old white male with malignant pleural mesothelioma involving the left hemothorax. He completed treatment with systemic chemotherapy with carboplatin, Alimta and Avastin status post 6 cycles. He has been tolerating the treatment fairly well with no significant adverse effects except for fatigue. The patient has no complaints today. Repeat CT scan of the chest, abdomen and pelvis showed no concerning findings for disease progression. I discussed the scan results with the patient and his family and recommended for him to continue in observation with repeat CT scan of the chest in 3 months. He was advised to call immediately if he has any concerning symptoms in the interval. The patient voices understanding of current disease status and treatment options and is in agreement with the current care plan. All questions were answered. The patient knows to call the clinic with any problems, questions or concerns. We can certainly see the patient much sooner if necessary.  Disclaimer: This note was dictated with voice recognition software. Similar sounding words can inadvertently be transcribed and may not be corrected upon review.

## 2018-02-07 NOTE — Patient Outreach (Signed)
Verified HIPPA compliant information with daughter, Ifeanyichukwu Wickham. Patient has not made follow up appointment, but daughter states they will in regards to recent ED visit. She states it has been hard as patient lost his wife this past December. Will mail successful letter in attention to [place] with know before you go and magnet.

## 2018-02-07 NOTE — Patient Instructions (Addendum)
Good to see you  Alvera Singh is your friend.  We will get an epidural and they will call you or call them at [phone] See me again in 2-3 weeks AFTER epidural

## 2018-02-07 NOTE — Therapy (Deleted)
[place], [place], [address] Phone: [phone]   Fax:  [phone]  February 07, 2018   @CCLISTADDRESS @  Physical Therapy Discharge Summary  Patient: Dean Pruitt  MRN: [id number]  Date of Birth: [birth year]   Diagnosis: Unsteadiness on feet  Muscle weakness (generalized)  Low back pain, unspecified back pain laterality, unspecified chronicity, with sciatica presence unspecified  Abnormal posture Referring Provider: [name] MD   The above patient had been seen in Physical Therapy *** times of *** treatments scheduled with *** no shows and *** cancellations.  The treatment consisted of *** The patient is: {improved/worse/unchanged:[id number]}  Subjective: ***  Discharge Findings: ***  Functional Status at Discharge: ***  {YTWKM:[id number]}  Plan - 02/07/18 1340    Clinical Impression Statement  Mr Dean Pruitt reports to have loer back injections as DO feels RT hip pain may be related to lower back.  Cont strength exer post injection. Still tender gluteals around RT hip.    PT Treatment/Interventions  Manual techniques;Patient/family education;Therapeutic exercise;Moist Heat;Ultrasound;Iontophoresis 4mg /ml Dexamethasone;Balance training    PT Next Visit Plan  review and continue establishing comprehensive HEP for core/gluteal strength, balance.    MMT   BERG     PT Home Exercise Plan  SLS, tandem stance both at counter, bridge, supine clam green band.     Consulted and Agree with Plan of Care  Patient       Sincerely,   Schwanda Zima, Lillette Boxer, PT   CC @CCLISTRESTNAME @  [facility] [address] [place], [place], [address] Phone: [phone]   Fax:  [phone]  Patient: Dean Pruitt  MRN: [id number]  Date of Birth: Apr 27, 1934

## 2018-02-07 NOTE — Telephone Encounter (Signed)
Scheduled appt per 3/4 los - Gave patient AVS and calender per los. Central radiology to contact patient with ct schedule.

## 2018-02-07 NOTE — Assessment & Plan Note (Signed)
With patient having no improvement with the greater trochanteric injection I feel that an epidural of the back could be diagnostic as well as potentially therapeutic for any type of neurologic component to this pain.  Patient is not on any blood thinners at the moment.  Patient is in agreement with the plan.  Patient will have this done and then follow-up 2-3 weeks after.

## 2018-02-07 NOTE — Therapy (Signed)
[place], [place], [address] Phone: [phone]   Fax:  [phone]  Physical Therapy Treatment  Patient Details  Name: Dean Pruitt MRN: [id number] Date of Birth: 03/19/1934 Referring Provider: [name] MD   Encounter Date: 02/07/2018  PT End of Session - 02/07/18 1344    Visit Number  5    Number of Visits  7    Date for PT Re-Evaluation  02/12/18    Authorization Type  MCR    PT Start Time  0133    PT Stop Time  0215    PT Time Calculation (min)  42 min    Activity Tolerance  Patient tolerated treatment well    Behavior During Therapy  [facility] for tasks assessed/performed       Past Medical History:  Diagnosis Date  . Cataract   . Depression    situational  . GERD (gastroesophageal reflux disease)   . Goals of care, counseling/discussion 03/20/2017  . Hyperlipidemia   . Hypothyroidism   . Reactive depression 03/20/2017  . Thyroid cancer ([place])    PMH of; on supressive therapy    Past Surgical History:  Procedure Laterality Date  . CATARACT EXTRACTION, BILATERAL  2013 & 2014   Dr Celene Squibb  . colon polyps  2002 & 2005   negative 2008; Dr Earlean Shawl  . COLONOSCOPY W/ POLYPECTOMY    . LUNG BIOPSY Left 02/15/2017   Procedure: LUNG BIOPSY;  Surgeon: Melrose Nakayama, MD;  Location: [place];  Service: Thoracic;  Laterality: Left;  . PLEURAL EFFUSION DRAINAGE Left 02/15/2017   Procedure: DRAINAGE OF PLEURAL EFFUSION;  Surgeon: Melrose Nakayama, MD;  Location: [place];  Service: Thoracic;  Laterality: Left;  . PORTACATH PLACEMENT N/A 03/25/2017   Procedure: INSERTION PORT-A-CATH, right internal jugular;  Surgeon: Melrose Nakayama, MD;  Location: [place];  Service: Thoracic;  Laterality: N/A;  . THYROIDECTOMY  2001   S/P RAI  . VIDEO ASSISTED THORACOSCOPY Left 02/15/2017   Procedure: VIDEO ASSISTED THORACOSCOPY;  Surgeon: Melrose Nakayama, MD;  Location: [place];  Service: Thoracic;   Laterality: Left;    There were no vitals filed for this visit.                   [place] Adult PT Treatment/Exercise - 02/07/18 0001      Lumbar Exercises: Aerobic   Nustep  L6  LE x 5 minutes       Lumbar Exercises: Supine   Bridge  20 reps    Bridge with [employer]  20 reps    Bridge with [employer] Limitations  20 reps with blue band.     Single Leg Bridge  10 reps RT/LT    Straight Leg Raise  20 reps    Straight Leg Raises Limitations  RT/LT    Other Supine Lumbar Exercises  PPT x 10 10 sec      Knee/Hip Exercises: Stretches   Active Hamstring Stretch  Right;Left;1 rep;60 seconds    Piriformis Stretch  Right;3 reps;30 seconds      Manual Therapy   Manual Therapy  Soft tissue mobilization    Soft tissue mobilization  with tool                PT Short Term Goals - 01/17/18 1329      PT SHORT TERM GOAL #1   Title  --    Time  3    Period  Weeks    Status  New      PT SHORT TERM GOAL #2   Title  --    Time  3    Period  Weeks    Status  New      PT SHORT TERM GOAL #3   Title  --    Time  3    Period  Weeks    Status  New        PT Long Term Goals - 02/07/18 1414      PT LONG TERM GOAL #1   Title  He will report pain decreased 75% and be intermittant    Status  On-going      PT LONG TERM GOAL #2   Title  He will be independent with all HEP issued.     Status  On-going      PT LONG TERM GOAL #3   Title  BERG score improve to 54/56    Status  Unable to assess      PT LONG TERM GOAL #5   Title  Hip strength incr to 4+/5 for improved function ono feet    Status  Unable to assess            Plan - 02/07/18 1340    Clinical Impression Statement  Mr Dean Pruitt reports to have loer back injections as DO feels RT hip pain may be related to lower back.  Cont strength exer post injection. Still tender gluteals around RT hip.    PT Treatment/Interventions  Manual techniques;Patient/family education;Therapeutic exercise;Moist  Heat;Ultrasound;Iontophoresis 4mg /ml Dexamethasone;Balance training    PT Next Visit Plan  review and continue establishing comprehensive HEP for core/gluteal strength, balance.    MMT   BERG     PT Home Exercise Plan  SLS, tandem stance both at counter, bridge, supine clam green band.     Consulted and Agree with Plan of Care  Patient       Patient will benefit from skilled therapeutic intervention in order to improve the following deficits and impairments:  Postural dysfunction, Dizziness, Decreased balance, Decreased strength, Pain, Decreased activity tolerance  Visit Diagnosis: Unsteadiness on feet  Muscle weakness (generalized)  Low back pain, unspecified back pain laterality, unspecified chronicity, with sciatica presence unspecified  Abnormal posture     Problem List Patient Active Problem List   Diagnosis Date Noted  . Greater trochanteric bursitis, right 01/27/2018  . Pain of left eye 01/07/2018  . Otalgia, right 12/21/2017  . Bursitis of hip 11/17/2017  . Bilateral low back pain without sciatica 11/17/2017  . Thoracic aortic aneurysm without rupture ([place]) 11/09/2017  . Muscular chest pain 11/09/2017  . Recurrent falls 11/09/2017  . Hypothyroidism 10/13/2017  . B12 deficiency 10/13/2017  . Change in stool 10/13/2017  . Port catheter in place 06/02/2017  . Degenerative arthritis of left knee 03/24/2017  . Goals of care, counseling/discussion 03/20/2017  . Encounter for antineoplastic chemotherapy 03/20/2017  . Reactive depression 03/20/2017  . Malignant mesothelioma of pleura ([place]) 02/19/2017  . History of lung biopsy 02/17/2017  . Pleural effusion 02/15/2017  . Greater trochanteric bursitis of left hip 12/31/2016  . Pleural effusion, left 10/27/2016  . infrarenal abdominal aortic aneurysm without rupture ([place]) - [place] needed 2020 10/22/2016  . Left lumbar radiculopathy 10/12/2016  . Bursitis of left hip 09/30/2016  . Left foot drop 09/30/2016  . Toe pain, left  08/13/2016  . Myalgia 07/04/2016  . Mild cognitive impairment 07/03/2016  . Adjustment disorder with  depressed mood 07/03/2016  . Frequency of urination 09/26/2014  . Macular degeneration 06/22/2011  . Fasting hyperglycemia 05/20/2009  . NONSPECIFIC ABNORMAL ELECTROCARDIOGRAM 05/20/2009  . THYROID CANCER, HX OF 05/20/2009  . History of colonic polyps 05/20/2009  . Hyperlipidemia 11/21/2007    Darrel Hoover  PT 02/07/2018, 2:16 PM  [facility] [address] [place], [place], [address] Phone: [phone]   Fax:  [phone]  Name: Dean Pruitt MRN: [id number] Date of Birth: [birth year]

## 2018-02-11 ENCOUNTER — Ambulatory Visit
Admission: RE | Admit: 2018-02-11 | Discharge: 2018-02-11 | Disposition: A | Discharge status: Home / Self Care | Payer: PPO | Source: Ambulatory Visit | Attending: Family Medicine | Admitting: Family Medicine

## 2018-02-11 DIAGNOSIS — M549 Dorsalgia, unspecified: Secondary | ICD-10-CM

## 2018-02-11 DIAGNOSIS — M47817 Spondylosis without myelopathy or radiculopathy, lumbosacral region: Secondary | ICD-10-CM | POA: Diagnosis not present

## 2018-02-11 MED ORDER — METHYLPREDNISOLONE ACETATE 40 MG/ML INJ SUSP (RADIOLOG
120.0000 mg | Freq: Once | INTRAMUSCULAR | Status: AC
  Administered 2018-02-11: 120 mg via EPIDURAL

## 2018-02-11 MED ORDER — IOPAMIDOL (ISOVUE-M 200) INJECTION 41%
1.0000 mL | Freq: Once | INTRAMUSCULAR | Status: AC
  Administered 2018-02-11: 1 mL via EPIDURAL

## 2018-02-11 NOTE — Discharge Instructions (Signed)

## 2018-02-14 ENCOUNTER — Ambulatory Visit: Payer: PPO

## 2018-02-14 DIAGNOSIS — M6281 Muscle weakness (generalized): Secondary | ICD-10-CM

## 2018-02-14 DIAGNOSIS — R2681 Unsteadiness on feet: Secondary | ICD-10-CM

## 2018-02-14 DIAGNOSIS — M545 Low back pain: Secondary | ICD-10-CM

## 2018-02-14 DIAGNOSIS — R293 Abnormal posture: Secondary | ICD-10-CM

## 2018-02-14 NOTE — Therapy (Signed)
[place], [place], [address] Phone: [phone]   Fax:  [phone]  Physical Therapy Treatment/Discharge  Patient Details  Name: Dean Pruitt MRN: [id number] Date of Birth: Jul 04, 1934 Referring Provider: [name] MD   Encounter Date: 02/14/2018  PT End of Session - 02/14/18 1327    Visit Number  6    Number of Visits  7    Date for PT Re-Evaluation  02/12/18    Authorization Type  MCR    PT Start Time  0130    PT Stop Time  0215    PT Time Calculation (min)  45 min    Activity Tolerance  Patient tolerated treatment well    Behavior During Therapy  [facility] for tasks assessed/performed       Past Medical History:  Diagnosis Date  . Cataract   . Depression    situational  . GERD (gastroesophageal reflux disease)   . Goals of care, counseling/discussion 03/20/2017  . Hyperlipidemia   . Hypothyroidism   . Reactive depression 03/20/2017  . Thyroid cancer ([place])    PMH of; on supressive therapy    Past Surgical History:  Procedure Laterality Date  . CATARACT EXTRACTION, BILATERAL  2013 & 2014   Dr Celene Squibb  . colon polyps  2002 & 2005   negative 2008; Dr Earlean Shawl  . COLONOSCOPY W/ POLYPECTOMY    . LUNG BIOPSY Left 02/15/2017   Procedure: LUNG BIOPSY;  Surgeon: Melrose Nakayama, MD;  Location: [place];  Service: Thoracic;  Laterality: Left;  . PLEURAL EFFUSION DRAINAGE Left 02/15/2017   Procedure: DRAINAGE OF PLEURAL EFFUSION;  Surgeon: Melrose Nakayama, MD;  Location: [place];  Service: Thoracic;  Laterality: Left;  . PORTACATH PLACEMENT N/A 03/25/2017   Procedure: INSERTION PORT-A-CATH, right internal jugular;  Surgeon: Melrose Nakayama, MD;  Location: [place];  Service: Thoracic;  Laterality: N/A;  . THYROIDECTOMY  2001   S/P RAI  . VIDEO ASSISTED THORACOSCOPY Left 02/15/2017   Procedure: VIDEO ASSISTED THORACOSCOPY;  Surgeon: Melrose Nakayama, MD;  Location: [place];  Service:  Thoracic;  Laterality: Left;    There were no vitals filed for this visit.  Subjective Assessment - 02/14/18 1332    Subjective  Injection really helped.  Slight pain.  No limitations now but Can feel it but no need to stop activity.  STW helped,  BPPV much improved  with only very mild occasional symptoms getting up from lying    Patient is accompained by:  Family member daughter in lobby    Currently in Pain?  Yes    Pain Score  1     Pain Location  Hip    Pain Orientation  Right    Pain Descriptors / Indicators  Dull    Pain Onset  More than a month ago    Pain Frequency  Intermittent    Aggravating Factors   mild with walking    Pain Relieving Factors  rest         [facility] PT Assessment - 02/14/18 0001      Strength   Overall Strength Comments  Hip abduction and ext 4/5, maybe slightly higher      Berg Balance Test   Sit to Stand  Able to stand without using hands and stabilize independently    Standing Unsupported  Able to stand safely 2 minutes    Sitting with Back Unsupported but Feet Supported on Floor or Stool  Able to sit safely and securely 2 minutes    Stand to Sit  Sits safely with minimal use of hands    Transfers  Able to transfer safely, minor use of hands    Standing Unsupported with Eyes Closed  Able to stand 10 seconds safely    Standing Ubsupported with Feet Together  Able to place feet together independently and stand 1 minute safely    From Standing, Reach Forward with Outstretched Arm  Can reach confidently >25 cm (10")    From Standing Position, Pick up Object from Floor  Able to pick up shoe safely and easily    From Standing Position, Turn to Look Behind Over each Shoulder  Looks behind from both sides and weight shifts well    Turn 360 Degrees  Able to turn 360 degrees safely in 4 seconds or less    Standing Unsupported, Alternately Place Feet on Step/Stool  Able to stand independently and safely and complete 8 steps in 20 seconds    Standing  Unsupported, One Foot in Front  Able to plae foot ahead of the other independently and hold 30 seconds    Standing on One Leg  Able to lift leg independently and hold equal to or more than 3 seconds    Total Score  53                            PT Short Term Goals - 01/17/18 1329      PT SHORT TERM GOAL #1   Title  --    Time  3    Period  Weeks    Status  New      PT SHORT TERM GOAL #2   Title  --    Time  3    Period  Weeks    Status  New      PT SHORT TERM GOAL #3   Title  --    Time  3    Period  Weeks    Status  New        PT Long Term Goals - 02/14/18 1340      PT LONG TERM GOAL #1   Title  He will report pain decreased 75% and be intermittant    Status  Achieved      PT LONG TERM GOAL #2   Title  He will be independent with all HEP issued.     Baseline  He does not do the exercise consistnetly    Status  Not Met      PT LONG TERM GOAL #3   Title  BERG score improve to 54/56    Baseline  53/56 improved 2 points with decr balance as BOS narrows    Status  Partially Met      PT LONG TERM GOAL #4   Title  He will report diziness on rising decr 50% or more    Baseline  improved    Status  Partially Met      PT LONG TERM GOAL #5   Title  Hip strength incr to 4+/5 for improved function ono feet    Baseline  still more like 4/5    Status  Not Met            Plan - 02/14/18 1329    Clinical Impression Statement  [name] is significantly better with decr pain post injection allowing un limited activity without incr pain.  He does not appear to be doing the HEP per his report so more exercises probably would not make much difference for him.  We would be glad o see him later if Dr Tamala Julian feels he nmeeds some intervention if pain returns.   He felt he did not need PT any more.      PT Treatment/Interventions  Manual techniques;Patient/family education;Therapeutic exercise;Moist Heat;Ultrasound;Iontophoresis 9m/ml Dexamethasone;Balance  training    PT Next Visit Plan  Discharge per pt request    PT Home Exercise Plan  SLS, tandem stance both at counter, bridge, supine clam green band.     Consulted and Agree with Plan of Care  Patient;Family member/caregiver    Family Member Consulted  spoke to daughter about plan.        Patient will benefit from skilled therapeutic intervention in order to improve the following deficits and impairments:  Postural dysfunction, Dizziness, Decreased balance, Decreased strength, Pain, Decreased activity tolerance  Visit Diagnosis: Unsteadiness on feet  Muscle weakness (generalized)  Low back pain, unspecified back pain laterality, unspecified chronicity, with sciatica presence unspecified  Abnormal posture     Problem List Patient Active Problem List   Diagnosis Date Noted  . Greater trochanteric bursitis, right 01/27/2018  . Pain of left eye 01/07/2018  . Otalgia, right 12/21/2017  . Bursitis of hip 11/17/2017  . Bilateral low back pain without sciatica 11/17/2017  . Thoracic aortic aneurysm without rupture ([place] 11/09/2017  . Muscular chest pain 11/09/2017  . Recurrent falls 11/09/2017  . Hypothyroidism 10/13/2017  . B12 deficiency 10/13/2017  . Change in stool 10/13/2017  . Port catheter in place 06/02/2017  . Degenerative arthritis of left knee 03/24/2017  . Goals of care, counseling/discussion 03/20/2017  . Encounter for antineoplastic chemotherapy 03/20/2017  . Reactive depression 03/20/2017  . Malignant mesothelioma of pleura ([place] 02/19/2017  . History of lung biopsy 02/17/2017  . Pleural effusion 02/15/2017  . Greater trochanteric bursitis of left hip 12/31/2016  . Pleural effusion, left 10/27/2016  . [place]al abdominal [place] 10/22[place]  . Left lumbar radiculopathy 10/12/2016  . Bursitis of left hip 09/30/2016  . Left foot drop 09/30/2016  . Toe pain, left 08/13/2016  . Myalgia 07/04/2016  . Mild cognitive  impairment 07/03/2016  . Adjustment disorder with depressed mood 07/03/2016  . Frequency of urination 09/26/2014  . Macular degeneration 06/22/2011  . Fasting hyperglycemia 05/20/2009  . NONSPECIFIC ABNORMAL ELECTROCARDIOGRAM 05/20/2009  . THYROID CANCER, HX OF 05/20/2009  . History of colonic polyps 05/20/2009  . Hyperlipidemia 11/21/2007    CDarrel Hoover3/10/2018, 1:49 PM  [facility]GMun[address] [place] [address]Phone: [phone]  Fax:  [phone] Name: LWin GuajardoMRN: [id number] of Birth: 8Sep 27, 1935 PHYSICAL THERAPY DISCHARGE SUMMARY  Visits from Start of Care: 6  Current functional level related to goals / functional outcomes: See above   Remaining deficits: See above   Education / Equipment: HEP . It does not appear he does his HEP Plan: Patient agrees to discharge.  Patient goals were partially met. Patient is being discharged due to the patient's request.  ?????

## 2018-02-16 ENCOUNTER — Telehealth: Payer: Self-pay | Admitting: Family Medicine

## 2018-02-16 ENCOUNTER — Ambulatory Visit: Payer: PPO | Admitting: Physical Therapy

## 2018-02-16 NOTE — Telephone Encounter (Signed)
Pt wanted you to know the injection he had at [place] worked really well for him.  Pain isn't as bad as it was prior to the injection.  Pt just wanted to give you a heads up so you will know how it went prior to him seeing you later this month.

## 2018-02-21 ENCOUNTER — Encounter: Payer: Self-pay | Admitting: Family

## 2018-02-21 ENCOUNTER — Other Ambulatory Visit (INDEPENDENT_AMBULATORY_CARE_PROVIDER_SITE_OTHER): Payer: PPO

## 2018-02-21 ENCOUNTER — Other Ambulatory Visit: Payer: Self-pay | Admitting: Family

## 2018-02-21 ENCOUNTER — Ambulatory Visit (INDEPENDENT_AMBULATORY_CARE_PROVIDER_SITE_OTHER): Payer: PPO | Admitting: Family

## 2018-02-21 VITALS — BP 132/82 | HR 62 | Temp 98.6°F | Ht 70.0 in | Wt 212.0 lb

## 2018-02-21 DIAGNOSIS — R197 Diarrhea, unspecified: Secondary | ICD-10-CM | POA: Diagnosis not present

## 2018-02-21 DIAGNOSIS — E039 Hypothyroidism, unspecified: Secondary | ICD-10-CM

## 2018-02-21 LAB — TSH: TSH: 9.29 u[IU]/mL — AB (ref 0.35–4.50)

## 2018-02-21 NOTE — Progress Notes (Signed)
Dean Pruitt is a 84 y.o. male with the following history as recorded in EpicCare:  Patient Active Problem List   Diagnosis Date Noted  . Greater trochanteric bursitis, right 01/27/2018  . Pain of left eye 01/07/2018  . Otalgia, right 12/21/2017  . Bursitis of hip 11/17/2017  . Bilateral low back pain without sciatica 11/17/2017  . Thoracic aortic aneurysm without rupture ([place]) 11/09/2017  . Muscular chest pain 11/09/2017  . Recurrent falls 11/09/2017  . Hypothyroidism 10/13/2017  . B12 deficiency 10/13/2017  . Change in stool 10/13/2017  . Port catheter in place 06/02/2017  . Degenerative arthritis of left knee 03/24/2017  . Goals of care, counseling/discussion 03/20/2017  . Encounter for antineoplastic chemotherapy 03/20/2017  . Reactive depression 03/20/2017  . Malignant mesothelioma of pleura ([place]) 02/19/2017  . History of lung biopsy 02/17/2017  . Pleural effusion 02/15/2017  . Greater trochanteric bursitis of left hip 12/31/2016  . Pleural effusion, left 10/27/2016  . infrarenal abdominal aortic aneurysm without rupture ([place]) - [place] needed 2020 10/22/2016  . Left lumbar radiculopathy 10/12/2016  . Bursitis of left hip 09/30/2016  . Left foot drop 09/30/2016  . Toe pain, left 08/13/2016  . Myalgia 07/04/2016  . Mild cognitive impairment 07/03/2016  . Adjustment disorder with depressed mood 07/03/2016  . Frequency of urination 09/26/2014  . Macular degeneration 06/22/2011  . Fasting hyperglycemia 05/20/2009  . NONSPECIFIC ABNORMAL ELECTROCARDIOGRAM 05/20/2009  . THYROID CANCER, HX OF 05/20/2009  . History of colonic polyps 05/20/2009  . Hyperlipidemia 11/21/2007    Current Outpatient Medications  Medication Sig Dispense Refill  . aspirin 81 MG tablet Take 81 mg by mouth daily.    Marland Kitchen donepezil (ARICEPT) 10 MG tablet Take 1 tablet (10 mg total) by mouth at bedtime. 30 tablet 2  . escitalopram (LEXAPRO) 20 MG tablet Take 1 tablet (20 mg total) by mouth at bedtime.  30 tablet 2  . gabapentin (NEURONTIN) 300 MG capsule TAKE ONE CAPSULE BY MOUTH AT NIGHT 90 capsule 3  . levothyroxine (SYNTHROID, LEVOTHROID) 150 MCG tablet TAKE 1 TABLET BY MOUTH ONCE DAILY MON- FRI AND 1/2 TABLET ON SAT & SUN 90 tablet 1  . lidocaine-prilocaine (EMLA) cream Apply 1 application topically as needed. (Patient taking differently: Apply 1 application topically as needed (port access). ) 30 g 0  . meloxicam (MOBIC) 7.5 MG tablet Take 1 tablet (7.5 mg total) by mouth daily. 30 tablet 3  . omeprazole (PRILOSEC) 20 MG capsule Take 1 capsule (20 mg total) by mouth daily. 90 capsule 1  . oxyCODONE (OXY IR/ROXICODONE) 5 MG immediate release tablet Take 1 tablet (5 mg total) by mouth every 6 (six) hours as needed for severe pain. 30 tablet 0  . Probiotic Product (PROBIOTIC PO) Take 1 tablet by mouth 2 (two) times daily.     . rosuvastatin (CRESTOR) 20 MG tablet Take 1 tablet (20 mg total) by mouth daily. 90 tablet 3   No current facility-administered medications for this visit.     Allergies: Ativan [lorazepam]; Fentanyl; and Tramadol  Past Medical History:  Diagnosis Date  . Cataract   . Depression    situational  . GERD (gastroesophageal reflux disease)   . Goals of care, counseling/discussion 03/20/2017  . Hyperlipidemia   . Hypothyroidism   . Reactive depression 03/20/2017  . Thyroid cancer ([place])    PMH of; on supressive therapy    Past Surgical History:  Procedure Laterality Date  . CATARACT EXTRACTION, BILATERAL  2013 & 2014  Dr Celene Squibb  . colon polyps  2002 & 2005   negative 2008; Dr Earlean Shawl  . COLONOSCOPY W/ POLYPECTOMY    . LUNG BIOPSY Left 02/15/2017   Procedure: LUNG BIOPSY;  Surgeon: Melrose Nakayama, MD;  Location: [place];  Service: Thoracic;  Laterality: Left;  . PLEURAL EFFUSION DRAINAGE Left 02/15/2017   Procedure: DRAINAGE OF PLEURAL EFFUSION;  Surgeon: Melrose Nakayama, MD;  Location: [place];  Service: Thoracic;  Laterality: Left;  . PORTACATH PLACEMENT  N/A 03/25/2017   Procedure: INSERTION PORT-A-CATH, right internal jugular;  Surgeon: Melrose Nakayama, MD;  Location: [place];  Service: Thoracic;  Laterality: N/A;  . THYROIDECTOMY  2001   S/P RAI  . VIDEO ASSISTED THORACOSCOPY Left 02/15/2017   Procedure: VIDEO ASSISTED THORACOSCOPY;  Surgeon: Melrose Nakayama, MD;  Location: [facility];  Service: Thoracic;  Laterality: Left;    Family History  Problem Relation Age of Onset  . Alzheimer's disease Mother   . Hypertension Mother   . Heart disease Father        CAD and angioplasty in 45s  . Cancer Father        Bladder  . Other Brother        valvular heart disease  . Diabetes Neg Hx   . Stroke Neg Hx     Social History   Tobacco Use  . Smoking status: Former Smoker    Packs/day: 1.00    Years: 5.00    Pack years: 5.00    Types: Cigarettes    Last attempt to quit: 12/07/1958    Years since quitting: 59.2  . Smokeless tobacco: Never Used  . Tobacco comment: smoked 1956-1960, up 1 ppd  Substance Use Topics  . Alcohol use: Yes    Alcohol/week: 8.4 oz    Types: 14 Glasses of wine per week    Subjective:  Patient is accompanied by his daughter who helps provide the history; he has dementia; he has had recurrent problems with diarrhea x at least 6 months; he did see his PCP about this issue in November and was referred back to his GI; unfortunately, his wife passed away in 2023/11/23 and the referral/ need for follow-up was overlooked; per patient, "everything is running through me." Notes that can have urgent need to get to the restroom; denies any blood- fecal test was done in November and was normal; normal abdominal/ pelvic CT was done in 02/2018; TSH was checked in November 2018- mildly elevated at [facility] at 6.10; patient is currently taking full dose of Synthroid M-F and 1/2 tablet on Saturday/ Sunday; denies any recurrent abdominal pain; denies any recent travel or stays in the hospital; daughter notes that diarrhea was present before  his wife passed away so she does not feel that stress is source of symptoms.  Objective:  Vitals:   02/21/18 0829  BP: 132/82  Pulse: 62  Temp: 98.6 F (37 C)  TempSrc: Oral  SpO2: 98%  Weight: 212 lb (96.2 kg)  Height: 5\' 10"  (1.778 m)    General: Well developed, well nourished, in no acute distress  Skin : Warm and dry.  Head: Normocephalic and atraumatic  Lungs: Respirations unlabored; clear to auscultation bilaterally without wheeze, rales, rhonchi  Abdomen: Soft; nontender; nondistended; normoactive bowel sounds; no masses or hepatosplenomegaly  Neurologic: Alert; speech intact; face symmetrical; moves all extremities well; CNII-XII intact without focal deficit   Assessment:  1. Diarrhea, unspecified type   2. Hypothyroidism, unspecified type  Plan:  1. Refer back to his GI- all in agreement that easiest to stay at [facility] at this time rather than transfer records to [place]; will check stool culture and C. Diff in the interim; 2. Update TSH today; will adjust medication accordingly;   No Follow-up on file.  Orders Placed This Encounter  Procedures  . Stool Culture    Standing Status:   Future    Standing Expiration Date:   02/21/2019  . TSH    Standing Status:   Future    Number of Occurrences:   1    Standing Expiration Date:   02/21/2019  . Ambulatory referral to Gastroenterology    Referral Priority:   Routine    Referral Type:   Consultation    Referral Reason:   Specialty Services Required    Number of Visits Requested:   1    Requested Prescriptions    No prescriptions requested or ordered in this encounter

## 2018-02-21 NOTE — Progress Notes (Signed)
Info given. Is there anything else recommended that can get his TSH level down outside of taking full tablet everyday. Patient's daughter wanted to know. Please advise.  Thanks

## 2018-02-23 ENCOUNTER — Other Ambulatory Visit: Payer: PPO

## 2018-02-23 DIAGNOSIS — R197 Diarrhea, unspecified: Secondary | ICD-10-CM | POA: Diagnosis not present

## 2018-02-25 LAB — CLOSTRIDIUM DIFFICILE EIA: C DIFFICILE TOXINS A+ B, EIA: NEGATIVE

## 2018-02-27 LAB — STOOL CULTURE
MICRO NUMBER: 90351323
MICRO NUMBER:: 90351321
MICRO NUMBER:: 90351322
SHIGA RESULT:: NOT DETECTED
SPECIMEN QUALITY: ADEQUATE
SPECIMEN QUALITY:: ADEQUATE
SPECIMEN QUALITY:: ADEQUATE

## 2018-02-28 ENCOUNTER — Encounter: Payer: Self-pay | Admitting: Psychology

## 2018-02-28 ENCOUNTER — Ambulatory Visit: Payer: PPO | Admitting: Psychology

## 2018-02-28 ENCOUNTER — Encounter: Payer: PPO | Admitting: Psychology

## 2018-02-28 DIAGNOSIS — R413 Other amnesia: Secondary | ICD-10-CM

## 2018-02-28 DIAGNOSIS — G3184 Mild cognitive impairment, so stated: Secondary | ICD-10-CM

## 2018-02-28 NOTE — Progress Notes (Signed)
NEUROBEHAVIORAL STATUS EXAM   Name: Dean Pruitt Date of Birth: August 30, 1934 Date of Interview: 02/28/2018  Reason for Referral:  Dean Pruitt is a 84 y.o. male who is referred for neuropsychological re-evaluation by Dr. Metta Clines of [facility] due to concerns about worsening memory and probable dementia. This patient is accompanied in the office by his daughter who supplements the history.  History of Presenting Problem [04/02/2016]:  Dean Pruitt denied having any concerns about his memory or cognitive abilities. He reported that any changes he has noticed he attributes to normal aging. Meanwhile, his wife reported concerns about his memory. She reported forgetfulness for recent conversations and events. The patient admitted that this does happen. The patient and his wife denied language (expressive and receptive) deficits, inattention or distractibility. They denied executive dysfunction. They reported a change in his driving ability characterized by difficulty "judging distances".   The patient's wife reported she became concerned about the patient's memory in December 2016/January 2017. She noted that the patient was sick with pneumonia at that time. It took him over 4 weeks to recover. Once he recovered from the pneumonia, she felt his memory did improve.   His wife also noted that she and the patient have been under a great deal of stress for the past two years, as they have two adult children who have serious medical conditions. Additionally, they have a grandson with chronic substance use problems. The patient's wife thinks that he seems stressed and depressed. She reported that he is more easily aggravated. Dean Pruitt denies depression but admits that he does feel "disturbed" by his stressors frequently. He denied suicidal ideation or intention. No imminent risk of self-harm was identified.  Dean Pruitt reported poor sleep with chronic insomnia. He reported he does  not take medication for sleep. He reported it takes him a long time to fall asleep, probably because he has too much "whirling" in his mind.   An MRI of the brain was completed on 02/26/2016. Per the MRI report, there was no acute intracranial abnormality. Generalized cerebral volume loss, within normal limits for age, was noted. Mild for age nonspecific cerebral white matter signal changes were also noted.  Dean Pruitt lives with his wife in their own home. He is retired (since 1999). He continues to drive but admitted to more difficulty with directions as well as trouble judging distances. He uses his GPS more often. He denied any problems with managing his medications. His wife noted that he does not take them as prescribed (reported that he is supposed to take them at different times throughout the day but that he takes them all at one time). Dean Pruitt denied any difficulty managing appointments. He also takes care of all the finances, including for their rental properties, and he does find this overwhelming.   The patient denied history of depression, anxiety, other MH disorder, as well as any history of MH treatment. He denied history of substance dependence/treatment.  Previous neuropsychological evaluation results [04/14/2016]: Clinical Impressions: Non-amnestic mild cognitive impairment (MCI). Results of the current cognitive evaluation are largely within normal limits, with most areas of function consistent with estimated premorbid intellectual abilities. However, there is evidence of mild executive dysfunction (e.g., mental flexibility and set shifting; verbal fluency; clock drawing). His cognitive profile is not consistent with hippocampal consolidation dysfunction or medial temporal lobe involvement at the present time. Additionally, his cognitive profile and current level of functioning do not warrant a diagnosis of dementia. Based  on the history provided, it appears that psychosocial  stressors and recent illness have likely contributed to cognitive impairment in daily life. However, I do think there is underlying MCI. Given the mild executive dysfunction noted on this evaluation, a subcortical etiology is most likely. His recent brain MRI did reveal mild white matter signal changes which could represent mild microvascular ischemia and could be the etiology of his MCI.  Interim History and Current Functioning: In the interim since I last saw him in 2017, the patient was diagnosed with malignant mesothelioma in March 2018 and underwent chemotherapy. He continues to be followed closely but has remained stable and has not had to undergo any more chemotherapy. In 04-09-2017 his grandson passed away. In 12/09/2017, his wife passed away from ovarian cancer. Per notes, he experienced some visual hallucinations after that but they were not distressing to him and they ultimately resolved. MRI of the brain from 07/19/2017 reportedly revealed moderate chronic small vessel disease but no evidence of metastasis or stroke. He last saw Dr. Tomi Likens on 12/10/2017. He is tolerating Aricept well. His escitalopram was increased from 10 mg to 20 mg daily.  At today's visit (02/28/2018), the patient reports that since our last evaluation in 2017 his memory has improved. His daughter, who has lived with him since October 2018, agrees that he is much clearer cognitively since starting Aricept. They deny any recent difficulties with forgetfulness, misplacing/losing items, repeating statements/questions, word finding difficulty, comprehension problems, difficulty concentrating or getting lost when driving. He drives minimally/locally and has not had any trouble with this. His daughter fills his pillbox and he remembers to take his medications but sometimes he is resistant to taking them because he doesn't think they are helping. His daughter manages the finances/bills. He can prepare simple meals if necessary (making a  sandwich or heating up soup). He can manage his appointments, but his daughter helps with medical appointments. He is independent with all basic ADLs (bathing, eating, walking, dressing, toileting).   He reports that several medical/physical issues that were bothering him in the past have significantly improved or resolved. He underwent therapy for vertigo which has really helped him. He started getting shots in his back for his hip pain which has really helped his walking/balance, and he has not had any recent falls. He denies any physical complaints at the present time.   He says his mood has been good. His daughter agrees. She states that as long as he takes his medications his mood is good but if he doesn't then "he can get grouchy". He denies suicidal ideation/intention. His appetite is good. He reports he was having trouble sleeping for a while but this has gotten better too. He may doze off during the day. His daughter denies any changes in personality or behavior.  He continues to have 2 glasses of wine most nights.   Social History: Born/Raised: [employer] Education: Some college (approximately one year) Occupational history: Press photographer, retired 1999 Marital history: Married Gaffer. Recently widowed. The patient has two adult children from a previous marriage. Alcohol/Tobacco/Substances: He reported that he drinks a couple glasses of wine most nights. Denied history of heavy drinking. Denied substance abuse.   Medical History: Past Medical History:  Diagnosis Date  . Cataract   . Depression    situational  . GERD (gastroesophageal reflux disease)   . Goals of care, counseling/discussion 03/20/2017  . Hyperlipidemia   . Hypothyroidism   . Reactive depression 03/20/2017  . Thyroid cancer ([place])  PMH of; on supressive therapy     Current Medications:  Outpatient Encounter Medications as of 02/28/2018  Medication Sig  . aspirin 81 MG tablet Take 81 mg by mouth daily.  Marland Kitchen donepezil  (ARICEPT) 10 MG tablet Take 1 tablet (10 mg total) by mouth at bedtime.  Marland Kitchen escitalopram (LEXAPRO) 20 MG tablet Take 1 tablet (20 mg total) by mouth at bedtime.  . gabapentin (NEURONTIN) 300 MG capsule TAKE ONE CAPSULE BY MOUTH AT NIGHT  . levothyroxine (SYNTHROID, LEVOTHROID) 150 MCG tablet TAKE 1 TABLET BY MOUTH ONCE DAILY MON- FRI AND 1/2 TABLET ON SAT & SUN  . lidocaine-prilocaine (EMLA) cream Apply 1 application topically as needed. (Patient taking differently: Apply 1 application topically as needed (port access). )  . meloxicam (MOBIC) 7.5 MG tablet Take 1 tablet (7.5 mg total) by mouth daily.  Marland Kitchen omeprazole (PRILOSEC) 20 MG capsule Take 1 capsule (20 mg total) by mouth daily.  Marland Kitchen oxyCODONE (OXY IR/ROXICODONE) 5 MG immediate release tablet Take 1 tablet (5 mg total) by mouth every 6 (six) hours as needed for severe pain.  . Probiotic Product (PROBIOTIC PO) Take 1 tablet by mouth 2 (two) times daily.   . rosuvastatin (CRESTOR) 20 MG tablet Take 1 tablet (20 mg total) by mouth daily.   No facility-administered encounter medications on file as of 02/28/2018.      Behavioral Observations:   Appearance: Neatly and appropriately dressed and groomed Gait: Ambulated independently, no gross abnormalities observed Speech: Fluent; normal rate, rhythm and volume. No evidence of word finding difficulty. Thought process: Linear, goal directed Affect: Full, stable, euthymic Interpersonal: Pleasant, appropriate   30 minutes spent face-to-face with patient completing neurobehavioral status exam. 30 minutes spent integrating medical records/clinical data and completing this report. CPT code [id number].   TESTING: There is medical necessity to proceed with neuropsychological assessment as the results will be used to aid in differential diagnosis and clinical decision-making and to inform specific treatment recommendations. Per medical records reviewed, there has been a worsening in cognitive functioning and  a reasonable suspicion of conversion of previously diagnosed MCI to dementia.  Clinical Decision Making: In considering the patient's current level of functioning, level of presumed impairment, nature of symptoms, emotional and behavioral responses during the interview, level of literacy, and observed level of motivation, a battery of tests was selected and communicated to the psychometrician.    PLAN: The patient will return tomorrow to complete the above referenced full battery of neuropsychological testing with a psychometrician under my supervision. Education regarding testing procedures was provided to the patient. Subsequently, the patient will see this provider for a follow-up session at which time his test performances and my impressions and treatment recommendations will be reviewed in detail.  Evaluation ongoing; full report to follow.

## 2018-03-01 ENCOUNTER — Encounter: Payer: Self-pay | Admitting: Family Medicine

## 2018-03-01 ENCOUNTER — Ambulatory Visit (INDEPENDENT_AMBULATORY_CARE_PROVIDER_SITE_OTHER): Payer: PPO | Admitting: Family Medicine

## 2018-03-01 ENCOUNTER — Ambulatory Visit (INDEPENDENT_AMBULATORY_CARE_PROVIDER_SITE_OTHER): Payer: PPO | Admitting: Psychology

## 2018-03-01 DIAGNOSIS — R413 Other amnesia: Secondary | ICD-10-CM

## 2018-03-01 DIAGNOSIS — M545 Low back pain, unspecified: Secondary | ICD-10-CM

## 2018-03-01 NOTE — Progress Notes (Signed)
[name] [address] [place], [place] [address] Phone: [phone] Subjective:      CC: Right hip pain follow-up  YKD:[id number]  Dean Pruitt is a 84 y.o. male coming in with complaint of  Right hip pain.  Patient has been seen multiple times.  Had more greater trochanteric as well as concerning for more of a nerve root impingement.  Patient given an injection at L3-L4 on the right side on February 11, 2018.  Feeling significantly better at this time.  Patient states not pain-free but significantly improved.     Past Medical History:  Diagnosis Date  . Cataract   . Depression    situational  . GERD (gastroesophageal reflux disease)   . Goals of care, counseling/discussion 03/20/2017  . Hyperlipidemia   . Hypothyroidism   . Reactive depression 03/20/2017  . Thyroid cancer ([place])    PMH of; on supressive therapy   Past Surgical History:  Procedure Laterality Date  . CATARACT EXTRACTION, BILATERAL  2013 & 2014   Dr Celene Squibb  . colon polyps  2002 & 2005   negative 2008; Dr Earlean Shawl  . COLONOSCOPY W/ POLYPECTOMY    . LUNG BIOPSY Left 02/15/2017   Procedure: LUNG BIOPSY;  Surgeon: Melrose Nakayama, MD;  Location: [facility];  Service: Thoracic;  Laterality: Left;  . PLEURAL EFFUSION DRAINAGE Left 02/15/2017   Procedure: DRAINAGE OF PLEURAL EFFUSION;  Surgeon: Melrose Nakayama, MD;  Location: [facility];  Service: Thoracic;  Laterality: Left;  . PORTACATH PLACEMENT N/A 03/25/2017   Procedure: INSERTION PORT-A-CATH, right internal jugular;  Surgeon: Melrose Nakayama, MD;  Location: [facility];  Service: Thoracic;  Laterality: N/A;  . THYROIDECTOMY  2001   S/P RAI  . VIDEO ASSISTED THORACOSCOPY Left 02/15/2017   Procedure: VIDEO ASSISTED THORACOSCOPY;  Surgeon: Melrose Nakayama, MD;  Location: [facility];  Service: Thoracic;  Laterality: Left;   Social History   Socioeconomic History  . Marital status: Married    Spouse name: Not on file  . Number of  children: 2  . Years of education: Not on file  . Highest education level: Not on file  Occupational History  . Occupation: Retired  Scientific laboratory technician  . Financial resource strain: Not on file  . Food insecurity:    Worry: Not on file    Inability: Not on file  . Transportation needs:    Medical: Not on file    Non-medical: Not on file  Tobacco Use  . Smoking status: Former Smoker    Packs/day: 1.00    Years: 5.00    Pack years: 5.00    Types: Cigarettes    Last attempt to quit: 12/07/1958    Years since quitting: 59.2  . Smokeless tobacco: Never Used  . Tobacco comment: smoked 1956-1960, up 1 ppd  Substance and Sexual Activity  . Alcohol use: Yes    Alcohol/week: 8.4 oz    Types: 14 Glasses of wine per week  . Drug use: No  . Sexual activity: Not on file  Lifestyle  . Physical activity:    Days per week: Not on file    Minutes per session: Not on file  . Stress: Not on file  Relationships  . Social connections:    Talks on phone: Not on file    Gets together: Not on file    Attends religious service: Not on file    Active member of club or organization: Not on file  Attends meetings of clubs or organizations: Not on file    Relationship status: Not on file  Other Topics Concern  . Not on file  Social History Narrative   Exercise:  Yard work, walks on occasion               Allergies  Allergen Reactions  . Ativan [Lorazepam] Other (See Comments)    Mental status change-"went crazy in the head"  . Fentanyl Other (See Comments)    hallucinations  . Tramadol Other (See Comments)    Altered mental status "crazy in the head'   Family History  Problem Relation Age of Onset  . Alzheimer's disease Mother   . Hypertension Mother   . Heart disease Father        CAD and angioplasty in 13s  . Cancer Father        Bladder  . Other Brother        valvular heart disease  . Diabetes Neg Hx   . Stroke Neg Hx      Past medical history, social, surgical and family  history all reviewed in electronic medical record.  No pertanent information unless stated regarding to the chief complaint.   Review of Systems:Review of systems updated and as accurate as of 03/01/18  No headache, visual changes, nausea, vomiting, diarrhea, constipation, dizziness, abdominal pain, skin rash, fevers, chills, night sweats, weight loss, swollen lymph nodes, body aches, joint swelling, muscle aches, chest pain, shortness of breath, mood changes.   Objective  Blood pressure 130/78, pulse 73, height 5\' 10"  (1.778 m), weight 215 lb (97.5 kg), SpO2 94 %. Systems examined below as of 03/01/18   General: No apparent distress alert and oriented x3 mood and affect normal, dressed appropriately.  HEENT: Pupils equal, extraocular movements intact  Respiratory: Patient's speak in full sentences and does not appear short of breath  Cardiovascular: No lower extremity edema, non tender, no erythema  Skin: Warm dry intact with no signs of infection or rash on extremities or on axial skeleton.  Abdomen: Soft nontender  Neuro: Cranial nerves II through XII are intact, neurovascularly intact in all extremities with 2+ DTRs and 2+ pulses.  Lymph: No lymphadenopathy of posterior or anterior cervical chain or axillae bilaterally.  Gait normal with good balance and coordination.  MSK:  Non tender with full range of motion and good stability and symmetric strength and tone of shoulders, elbows, wrist, hip, knee and ankles bilaterally.  Mild arthritic changes Hip exam shows the patient has full range of motion.  Negative straight leg test.  Negative numbness.  Full strength.    Impression and Recommendations:     This case required medical decision making of moderate complexity.      Note: This dictation was prepared with Dragon dictation along with smaller phrase technology. Any transcriptional errors that result from this process are unintentional.

## 2018-03-01 NOTE — Patient Instructions (Addendum)
Good to see you  Dean Pruitt is your friend.  Stay active.  If worsening pain call me [phone] See me again when you need me

## 2018-03-01 NOTE — Assessment & Plan Note (Signed)
Discussed with patient and icing regimen.  We discussed which activities of doing which wants to avoid.  Increase activity as tolerated.  Follow-up as needed continue the gabapentin

## 2018-03-01 NOTE — Progress Notes (Addendum)
   Neuropsychology Note  McBride completed 60 minutes of neuropsychological testing with technician, Milana Kidney, BS, under the supervision of Dr. Macarthur Critchley, Licensed Psychologist. The patient did not appear overtly distressed by the testing session, per behavioral observation or via self-report to the technician. Rest breaks were offered.   Clinical Decision Making: In considering the patient's current level of functioning, level of presumed impairment, nature of symptoms, emotional and behavioral responses during the interview, level of literacy, and observed level of motivation/effort, a battery of tests was selected and communicated to the psychometrician.  Communication between the psychologist and technician was ongoing throughout the testing session and changes were made as deemed necessary based on patient performance on testing, technician observations and additional pertinent factors such as those listed above.  Dean Pruitt will return within approximately 2 weeks for an interactive feedback session with Dean Pruitt at which time his test performances, clinical impressions and treatment recommendations will be reviewed in detail. The patient understands he can contact our office should he require our assistance before this time.  15 minutes spent performing neuropsychological evaluation services/clinical decision making (psychologist). [CPT 50539] 60 minutes spent face-to-face with patient administering standardized tests, 30 minutes spent scoring (technician). [CPT [id number], 76734]  Full report to follow.

## 2018-03-02 ENCOUNTER — Encounter: Payer: Self-pay | Admitting: Physical Therapy

## 2018-03-02 ENCOUNTER — Ambulatory Visit: Payer: PPO | Admitting: Physical Therapy

## 2018-03-02 DIAGNOSIS — H8111 Benign paroxysmal vertigo, right ear: Secondary | ICD-10-CM | POA: Diagnosis not present

## 2018-03-02 NOTE — Therapy (Addendum)
[facility] [address] [place], [place], [address] Phone: [phone]   Fax:  [phone]  Physical Therapy Treatment  Patient Details  Name: Dean Pruitt MRN: [id number] Date of Birth: 03-27-1934 Referring Provider: [name] MD   Encounter Date: 03/02/2018  PT End of Session - 03/02/18 1039    Visit Number  2    Number of Visits  4    Date for PT Re-Evaluation  05/01/18    Authorization Type  MCR    Authorization Time Period  03/02/18 to 05/01/18    PT Start Time  0804    PT Stop Time  0835    PT Time Calculation (min)  31 min    Activity Tolerance  Patient tolerated treatment well    Behavior During Therapy  [facility] for tasks assessed/performed       Past Medical History:  Diagnosis Date  . Cataract   . Depression    situational  . GERD (gastroesophageal reflux disease)   . Goals of care, counseling/discussion 03/20/2017  . Hyperlipidemia   . Hypothyroidism   . Reactive depression 03/20/2017  . Thyroid cancer ([place])    PMH of; on supressive therapy    Past Surgical History:  Procedure Laterality Date  . CATARACT EXTRACTION, BILATERAL  2013 & 2014   Dr Celene Squibb  . colon polyps  2002 & 2005   negative 2008; Dr Earlean Shawl  . COLONOSCOPY W/ POLYPECTOMY    . LUNG BIOPSY Left 02/15/2017   Procedure: LUNG BIOPSY;  Surgeon: Melrose Nakayama, MD;  Location: [place];  Service: Thoracic;  Laterality: Left;  . PLEURAL EFFUSION DRAINAGE Left 02/15/2017   Procedure: DRAINAGE OF PLEURAL EFFUSION;  Surgeon: Melrose Nakayama, MD;  Location: [facility];  Service: Thoracic;  Laterality: Left;  . PORTACATH PLACEMENT N/A 03/25/2017   Procedure: INSERTION PORT-A-CATH, right internal jugular;  Surgeon: Melrose Nakayama, MD;  Location: [facility];  Service: Thoracic;  Laterality: N/A;  . THYROIDECTOMY  2001   S/P RAI  . VIDEO ASSISTED THORACOSCOPY Left 02/15/2017   Procedure: VIDEO ASSISTED THORACOSCOPY;  Surgeon: Melrose Nakayama, MD;  Location: [facility];  Service: Thoracic;  Laterality: Left;    There were no vitals filed for this visit.  Subjective Assessment - 03/02/18 0830    Subjective  It got a whole lot better but never was completely gone. It pretty much only happened when I first sat up from lying down. It would go away in a few seconds.     Patient is accompained by:  Family member daughter    Pertinent History  possible dementia per daughter    Patient Stated Goals  wants to stop having spinning every time he sits up    Currently in Pain?  No/denies             Vestibular Assessment - 03/02/18 0816      Positional Testing   Dix-Hallpike  Dix-Hallpike Right;Dix-Hallpike Left      Dix-Hallpike Right   Dix-Hallpike Right Duration  10    Dix-Hallpike Right Symptoms  Upbeat, right rotatory nystagmus      Dix-Hallpike Left   Dix-Hallpike Left Duration  0    Dix-Hallpike Left Symptoms  No nystagmus                Vestibular Treatment/Exercise - 03/02/18 0825      Vestibular Treatment/Exercise   Vestibular Treatment Provided  Canalith Repositioning    Canalith Repositioning  Epley Manuever Right       EPLEY MANUEVER RIGHT   Number of Reps   2    Overall Response  Symptoms Resolved    Response Details   +symptoms & nystagmus 1st rep; none 2nd rep            PT Education - 03/02/18 1038    Education Details  will keep referral open for 60 days and he can call to make f/u appt if symptoms persist (he did not want to go ahead and schedule a f/u)    Person(s) Educated  Patient;Child(ren)    Methods  Explanation    Comprehension  Verbalized understanding          PT Long Term Goals - 03/02/18 1053      PT LONG TERM GOAL #1   Title  Patient will have negative positional testing for BPPV. (Target 05/01/16 for all goals)    Time  8    Period  Weeks    Status  New      PT LONG TERM GOAL #2   Title  Patient will be independent with Brandt-Daroff exercises for  HEP    Time  8    Period  Weeks    Status  New      PT LONG TERM GOAL #3   Title  Patient assessed for vestibular hypofunction and treatment plan established if present.    Time  8    Period  Weeks    Status  New            Plan - 03/02/18 1042    Clinical Impression Statement  Dean Pruitt was seen for vestibular evaluation on 02/04/18 and found to have right posterior canal BPPV. By end of session he was asymptomatic with Dix-Hallpike and no further appointments were indicated. Upon return today, he reports that the next morning he was much better but it was not completely gone. He waited to return to neurorehab until he completed his OP orthopedic PT. Patient with + rt Dix-Hallpike on assessment and again cleared with Epley maneuver. Checked for horizontal canal BPPV which was negative bil. Asked patient to make one additional f/u appointment in case he finds it is not completely cleared. He preferred to "call if needed" and educated his plan of care could be open for 60 days for return if needed.       Rehab Potential  Good    PT Frequency  Other (comment) 2 visits within 60 days (if needed)    PT Duration  8 weeks    PT Treatment/Interventions  Patient/family education;Therapeutic exercise;Balance training;Canalith Repostioning    PT Next Visit Plan  assess for BPPV and then for ? hypofunction    PT Home Exercise Plan  --    Consulted and Agree with Plan of Care  Patient;Family member/caregiver    Family Member Consulted  daughter       Patient will benefit from skilled therapeutic intervention in order to improve the following deficits and impairments:  Postural dysfunction, Dizziness, Decreased balance, Decreased strength, Pain, Decreased activity tolerance  Visit Diagnosis: BPPV (benign paroxysmal positional vertigo), right - Plan: PT plan of care cert/re-cert     Problem List Patient Active Problem List   Diagnosis Date Noted  . Greater trochanteric bursitis, right  01/27/2018  . Pain of left eye 01/07/2018  . Otalgia, right 12/21/2017  . Bursitis of hip 11/17/2017  . Bilateral low back pain without sciatica 11/17/2017  . Thoracic  aortic aneurysm without rupture ([place]) 11/09/2017  . Muscular chest pain 11/09/2017  . Recurrent falls 11/09/2017  . Hypothyroidism 10/13/2017  . B12 deficiency 10/13/2017  . Change in stool 10/13/2017  . Port catheter in place 06/02/2017  . Degenerative arthritis of left knee 03/24/2017  . Goals of care, counseling/discussion 03/20/2017  . Encounter for antineoplastic chemotherapy 03/20/2017  . Reactive depression 03/20/2017  . Malignant mesothelioma of pleura ([place]) 02/19/2017  . History of lung biopsy 02/17/2017  . Pleural effusion 02/15/2017  . Greater trochanteric bursitis of left hip 12/31/2016  . Pleural effusion, left 10/27/2016  . infrarenal abdominal aortic aneurysm without rupture ([place]) - [place] needed 2020 10/22/2016  . Left lumbar radiculopathy 10/12/2016  . Bursitis of left hip 09/30/2016  . Left foot drop 09/30/2016  . Toe pain, left 08/13/2016  . Myalgia 07/04/2016  . Mild cognitive impairment 07/03/2016  . Adjustment disorder with depressed mood 07/03/2016  . Frequency of urination 09/26/2014  . Macular degeneration 06/22/2011  . Fasting hyperglycemia 05/20/2009  . NONSPECIFIC ABNORMAL ELECTROCARDIOGRAM 05/20/2009  . THYROID CANCER, HX OF 05/20/2009  . History of colonic polyps 05/20/2009  . Hyperlipidemia 11/21/2007   07/08/18 Addended  PHYSICAL THERAPY DISCHARGE SUMMARY  Visits from Start of Care: 2  Current functional level related to goals / functional outcomes: Refer to above progress note   Remaining deficits: Refer to above progress note   Education / Equipment: Refer to above progress note  Plan: Patient agrees to discharge.  Patient goals were not met. Patient is being discharged due to being pleased with the current functional level.  ?????      Rexanne Mano, PT 03/02/2018,  5:53 PM  [facility] [address] [place], [place], [address] Phone: [phone]   Fax:  [phone]  Name: Dean Pruitt MRN: [id number] Date of Birth: 07-22-1934

## 2018-03-03 ENCOUNTER — Other Ambulatory Visit: Payer: Self-pay | Admitting: Neurology

## 2018-03-07 ENCOUNTER — Other Ambulatory Visit: Payer: Self-pay | Admitting: Neurology

## 2018-03-14 ENCOUNTER — Inpatient Hospital Stay: Payer: PPO | Attending: Internal Medicine

## 2018-03-14 DIAGNOSIS — C450 Mesothelioma of pleura: Secondary | ICD-10-CM | POA: Insufficient documentation

## 2018-03-14 DIAGNOSIS — Z452 Encounter for adjustment and management of vascular access device: Secondary | ICD-10-CM | POA: Diagnosis not present

## 2018-03-14 DIAGNOSIS — Z95828 Presence of other vascular implants and grafts: Secondary | ICD-10-CM

## 2018-03-14 MED ORDER — SODIUM CHLORIDE 0.9% FLUSH
10.0000 mL | Freq: Once | INTRAVENOUS | Status: AC
  Administered 2018-03-14: 10 mL
  Filled 2018-03-14: qty 10

## 2018-03-14 MED ORDER — HEPARIN SOD (PORK) LOCK FLUSH 100 UNIT/ML IV SOLN
500.0000 [IU] | Freq: Once | INTRAVENOUS | Status: AC
  Administered 2018-03-14: 500 [IU]
  Filled 2018-03-14: qty 5

## 2018-03-21 ENCOUNTER — Other Ambulatory Visit: Payer: Self-pay | Admitting: Family

## 2018-03-21 ENCOUNTER — Other Ambulatory Visit (INDEPENDENT_AMBULATORY_CARE_PROVIDER_SITE_OTHER): Payer: PPO

## 2018-03-21 DIAGNOSIS — E039 Hypothyroidism, unspecified: Secondary | ICD-10-CM

## 2018-03-21 LAB — TSH: TSH: 1.4 u[IU]/mL (ref 0.35–4.50)

## 2018-03-28 DIAGNOSIS — Z86010 Personal history of colonic polyps: Secondary | ICD-10-CM | POA: Diagnosis not present

## 2018-03-28 DIAGNOSIS — R197 Diarrhea, unspecified: Secondary | ICD-10-CM | POA: Diagnosis not present

## 2018-03-29 ENCOUNTER — Encounter: Payer: PPO | Admitting: Psychology

## 2018-04-04 ENCOUNTER — Encounter: Payer: Self-pay | Admitting: Neurology

## 2018-04-07 ENCOUNTER — Ambulatory Visit: Payer: PPO | Admitting: Neurology

## 2018-04-11 ENCOUNTER — Telehealth: Payer: Self-pay | Admitting: Neurology

## 2018-04-11 NOTE — Telephone Encounter (Signed)
Wants to talk to someone about the medication after he see Dr Si Raider. He sees her on 04-20-18 for the test results

## 2018-04-12 NOTE — Telephone Encounter (Signed)
Called and LM on VM for Lor

## 2018-04-15 NOTE — Telephone Encounter (Signed)
Called and spoke with Dean Pruitt. She was concerned they would have to wait several months after getting results of neuro cognitive tests to see Dr Tomi Likens and get any medications he may recommend. I advsd her Dr Si Raider will send Dr Tomi Likens the results and if he determines a medication is needled, we can call them at that time.

## 2018-04-19 NOTE — Progress Notes (Signed)
[place] RE-EVALUATION   Name:    Dean Pruitt  Date of Birth:   11/23/1934 Date of Interview:  02/28/2018 Date of Testing:  03/01/2018   Date of Feedback:  04/20/2018       Background Information:  Reason for Referral:  Dean Pruitt is a 84 y.o. male referred by Dr. Metta Clines to assess his current level of cognitive functioning and assist in differential diagnosis. The current evaluation consisted of a review of available medical records, an interview with the patient and his daughter, and the completion of a neuropsychological testing battery. Informed consent was obtained.  History of Presenting Problem [04/02/2016]:  Dean Pruitt denied having any concerns about his memory or cognitive abilities. He reported that any changes he has noticed he attributes to normal aging. Meanwhile, his wife reported concerns about his memory. She reported forgetfulness for recent conversations and events. The patient admitted that this does happen. The patient and his wife denied language (expressive and receptive) deficits, inattention or distractibility. They denied executive dysfunction. They reported a change in his driving ability characterized by difficulty "judging distances".   The patient's wife reported she became concerned about the patient's memory in December 2016/January 2017. She noted that the patient was sick with pneumonia at that time. It took him over 4 weeks to recover. Once he recovered from the pneumonia, she felt his memory did improve.   His wife also noted that she and the patient have been under a great deal of stress for the past two years, as they have two adult children who have serious medical conditions. Additionally, they have a grandson with chronic substance use problems. The patient's wife thinks that he seems stressed and depressed. She reported that he is more easily aggravated. Dean Pruitt denies depression but admits that he does feel  "disturbed" by his stressors frequently. He denied suicidal ideation or intention. No imminent risk of self-harm was identified.  Dean Pruitt reported poor sleep with chronic insomnia. He reported he does not take medication for sleep. He reported it takes him a long time to fall asleep, probably because he has too much "whirling" in his mind.   An MRI of the brain was completed on 02/26/2016. Per the MRI report, there was no acute intracranial abnormality. Generalized cerebral volume loss, within normal limits for age, was noted. Mild for age nonspecific cerebral white matter signal changes were also noted.  Dean Pruitt lives with his wife in their own home. He is retired (since 1999). He continues to drive but admitted to more difficulty with directions as well as trouble judging distances. He uses his GPS more often. He denied any problems with managing his medications. His wife noted that he does not take them as prescribed (reported that he is supposed to take them at different times throughout the day but that he takes them all at one time). Dean Pruitt denied any difficulty managing appointments. He also takes care of all the finances, including for their rental properties, and he does find this overwhelming.   The patient denied history of depression, anxiety, other MH disorder, as well as any history of MH treatment. He denied history of substance dependence/treatment.  Previous neuropsychological evaluation results [04/14/2016]: Clinical Impressions: Non-amnestic mild cognitive impairment (MCI). Results of the current cognitive evaluation are largely within normal limits, with most areas of function consistent with estimated premorbid intellectual abilities. However, there is evidence of mild executive dysfunction (e.g., mental flexibility and set shifting; verbal  fluency; clock drawing). His cognitive profile is not consistent with hippocampal consolidation dysfunction or medial temporal  lobe involvement at the present time. Additionally, his cognitive profile and current level of functioning do not warrant a diagnosis of dementia. Based on the history provided, it appears that psychosocial stressors and recent illness have likely contributed to cognitive impairment in daily life. However, I do think there is underlying MCI. Given the mild executive dysfunction noted on this evaluation, a subcortical etiology is most likely. His recent brain MRI did reveal mild white matter signal changes which could represent mild microvascular ischemia and could be the etiology of his MCI.  Interim History and Current Functioning: In the interim since I last saw him in 2017, the patient was diagnosed with malignant mesothelioma in March 2018 and underwent chemotherapy. He continues to be followed closely but has remained stable and has not had to undergo any more chemotherapy. In March 16, 2017 his grandson passed away. In 11/15/2017, his wife passed away from ovarian cancer. Per notes, he experienced some visual hallucinations after that but they were not distressing to him and they ultimately resolved. MRI of the brain from 07/19/2017 reportedly revealed moderate chronic small vessel disease but no evidence of metastasis or stroke. He last saw Dr. Tomi Likens on 12/10/2017. He is tolerating Aricept well. His escitalopram was increased from 10 mg to 20 mg daily.  At today's visit (02/28/2018), the patient reports that since our last evaluation in 2017 his memory has improved. His daughter, who has lived with him since October 2018, agrees that he is much clearer cognitively since starting Aricept. They deny any recent difficulties with forgetfulness, misplacing/losing items, repeating statements/questions, word finding difficulty, comprehension problems, difficulty concentrating or getting lost when driving. He drives minimally/locally and has not had any trouble with this. His daughter fills his pillbox and he  remembers to take his medications but sometimes he is resistant to taking them because he doesn't think they are helping. His daughter manages the finances/bills. He can prepare simple meals if necessary (making a sandwich or heating up soup). He can manage his appointments, but his daughter helps with medical appointments. He is independent with all basic ADLs (bathing, eating, walking, dressing, toileting).   He reports that several medical/physical issues that were bothering him in the past have significantly improved or resolved. He underwent therapy for vertigo which has really helped him. He started getting shots in his back for his hip pain which has really helped his walking/balance, and he has not had any recent falls. He denies any physical complaints at the present time.   He says his mood has been good. His daughter agrees. She states that as long as he takes his medications his mood is good but if he doesn't then "he can get grouchy". He denies suicidal ideation/intention. His appetite is good. He reports he was having trouble sleeping for a while but this has gotten better too. He may doze off during the day. His daughter denies any changes in personality or behavior.  He continues to have 2 glasses of wine most nights.   Social History: Born/Raised: [employer] Education: Some college (approximately one year) Occupational history: Press photographer, retired 1999 Marital history: Married Gaffer. Recently widowed. The patient has two adult children from a previous marriage. Alcohol/Tobacco/Substances: He reported that he drinks a couple glasses of wine most nights. Denied history of heavy drinking. Denied substance abuse.   Medical History:  Past Medical History:  Diagnosis Date  . Cataract   .  Depression    situational  . GERD (gastroesophageal reflux disease)   . Goals of care, counseling/discussion 03/20/2017  . Hyperlipidemia   . Hypothyroidism   . Reactive depression 03/20/2017  .  Thyroid cancer [facility])    PMH of; on supressive therapy    Current medications:  Outpatient Encounter Medications as of 04/20/2018  Medication Sig  . aspirin 81 MG tablet Take 81 mg by mouth daily.  Marland Kitchen donepezil (ARICEPT) 10 MG tablet TAKE 1 TABLET BY MOUTH AT BEDTIME  . escitalopram (LEXAPRO) 20 MG tablet TAKE 1 TABLET BY MOUTH AT BEDTIME  . gabapentin (NEURONTIN) 300 MG capsule TAKE ONE CAPSULE BY MOUTH AT NIGHT  . levothyroxine (SYNTHROID, LEVOTHROID) 150 MCG tablet TAKE 1 TABLET BY MOUTH ONCE DAILY MON- FRI AND 1/2 TABLET ON SAT & SUN  . lidocaine-prilocaine (EMLA) cream Apply 1 application topically as needed. (Patient taking differently: Apply 1 application topically as needed (port access). )  . meloxicam (MOBIC) 7.5 MG tablet Take 1 tablet (7.5 mg total) by mouth daily.  Marland Kitchen omeprazole (PRILOSEC) 20 MG capsule Take 1 capsule (20 mg total) by mouth daily.  Marland Kitchen oxyCODONE (OXY IR/ROXICODONE) 5 MG immediate release tablet Take 1 tablet (5 mg total) by mouth every 6 (six) hours as needed for severe pain.  . Probiotic Product (PROBIOTIC PO) Take 1 tablet by mouth 2 (two) times daily.   . rosuvastatin (CRESTOR) 20 MG tablet Take 1 tablet (20 mg total) by mouth daily.   No facility-administered encounter medications on file as of 04/20/2018.      Current Examination:  Behavioral Observations:  Appearance: Neatly and appropriately dressed and groomed Gait: Ambulated independently, no gross abnormalities observed Speech: Fluent; normal rate, rhythm and volume. No evidence of word finding difficulty. Thought process: Linear, goal directed Affect: Full, stable, euthymic Interpersonal: Pleasant, appropriate Orientation: Oriented to person, place, current date and current day of the week. Disoriented to current age (2 years off), current month (two months off), and current year ("2039"). Accurately named the current President and his predecessor.   Tests Administered: . Wechsler Adult  Intelligence Scale-Fourth Edition (WAIS-IV): Similarities, Music therapist, Coding and Digit Span subtests . [employer] Verbal Learning Test - 2nd Edition (CVLT-2) Short Form . Repeatable Battery for the Assessment of Neuropsychological Status (RBANS) Form A:  Story Memory and Story Recall subtests, Figure Copy and Recall subtests and Semantic Fluency subtest . Neuropsychological Assessment Battery (NAB) Language Module, Form 1: Naming subtest . [place] Diagnostic Aphasia Examination: Complex Ideational Material subtest . Controlled Oral Word Association Test (COWAT) . Trail Making Test A and B . Clock drawing test . Beck Depression Inventory -2nd Edition (BDI-II) . Generalized Anxiety Disorder - 7 item screener (GAD-7)  Test Results: Note: Standardized scores are presented only for use by appropriately trained professionals and to allow for any future test-retest comparison. These scores should not be interpreted without consideration of all the information that is contained in the rest of the report. The most recent standardization samples from the test publisher or other sources were used whenever possible to derive standard scores; scores were corrected for age, gender, ethnicity and education when available.   Test Scores:  Test Name Raw Score Standardized Score Descriptor  WAIS-IV Subtests     Similarities 22/36 ss= 10 Average  Block Design 24/66 ss= 10 Average  Coding 32/135 ss= 8 Low end of average  Digit Span Forward 9/16 ss= 9 Average  Digit Span Backward 6/16 ss= 8 Low end of average  RBANS  Subtests     Story Memory 13/24 Z= -0.6 Average  Story Recall 3/12 Z= -1.6 Borderline  Figure Copy 16/20 Z= -0.7 Average  Figure Recall 8/20 Z= -0.8 Low average  Semantic Fluency 7 Z= -2.8 Severely impaired  CVLT-II Scores     Trial 1 3/9 Z= -2 Impaired  Trial 4 5/9 Z= -1.5 Borderline  Trials 1-4 total 18/36 T= 44 Average  SD Free Recall 4/9 Z= -1 Low average  LD Free Recall 0/9 Z= -1.5  Borderline  LD Cued Recall 3/9 Z= -1 Low average  Recognition Discriminability 8/9 hits, 2 false positives Z= 0.5 Average  Forced Choice Recognition 9/9  WNL  NAB Language subtest     Naming 27/31 T= 41 Low average  COWAT-FAS 20 T= 35 Borderline  COWAT-Animals 4 T= 21 Severely impaired  Trail Making Test A  120" 1 error T= 27 Impaired  Trail Making Test B  255" 2 errors T= 38 Low average  Clock Drawing   Impaired   BDI-II 4/63  WNL  GAD-7 0/21  WNL      Description of Test Results:  Premorbid verbal intellectual abilities were estimated to have been within the average range based on a test of word reading at his last exam. Psychomotor processing speed was variable, ranging from average on a coding task (stable from 2017) to impaired on Trails A (significant decline from 2017). Auditory attention and working memory were average (mild decline from 2017). Visual-spatial construction was average (stable). Language abilities were below expectation. Specifically, confrontation naming was low average (decline from 2017), and semantic verbal fluency was severely impaired (decline from 2017). With regard to verbal memory, encoding and acquisition of non-contextual information (i.e., word list) was average (stable). After a brief distracter task, free recall was low average (4/9 items; stable). After a delay, free recall was borderline (0/9 items, significant decline from 2017). Cued recall was low average (3/9 items; decline from 2017). Performance on a yes/no recognition task was average (stable). On another verbal memory test, encoding and acquisition of contextual auditory information (i.e., short stories) was average but mildly reduced relative to 2017. After a delay, free recall was borderline (mild decline). With regard to non-verbal memory, delayed free recall of visual information was low average (mild decline). Executive functioning was variable. Mental flexibility and set-shifting were low  average (stable) on Trails B. Verbal fluency with phonemic search restrictions was borderline (stable). Verbal abstract reasoning was average (stable). Performance on a clock drawing task was impaired (stable). On self-report measures of mood, the patient's responses were not indicative of clinically significant depression or anxiety at the present time.    Clinical Impressions: Mild vascular dementia. Results of cognitive testing revealed interval decline in several aspects of cognitive functioning relative to his performances two years ago. Additionally, while he is still able to maintain several complex ADLs, I suspect he would have difficulty finances/medications which his daughter is now managing. As such, diagnostic criteria for a dementia syndrome are now met.  Similar to 2017, the patient's cognitive profile reflects mild executive dysfunction (impaired clock drawing, mild difficulty on Trails B, interval decline in auditory attention) and disruption of frontal-subcortical memory networks (impaired retrieval but aided by semantic cueing and recognition). He is also demonstrating notable declines in confrontation naming and verbal fluency (semantic > phonemic), which could just represent worsening retrieval deficits - however, I can't rule out developing Alzheimer's disease. Overall, however, the patient's cognitive profile continues to be more suggestive of subcortical than  cortical dysfunction. This could be due to vascular etiology (small vessel disease). He is not reporting any depression or anxiety, and there is no indication of behavioral disturbance. I would characterize dementia as mild stage at this point.    Recommendations/Plan: Based on the findings of the present evaluation, the following recommendations are offered:  1. He is tolerating Aricept well and it is recommended this be continued. 2. He appears to have appropriate support with complex ADLs from his daughter. She is managing  finances, and helping manage medications and appointments. It is recommended he continue to limit his driving to familiar areas in close geographic proximity to his home and avoid nighttime driving. 3. The patient and his daughter were educated and counseled regarding vascular cognitive impairment (ie vascular dementia) and empirically supported recommendations to reduce the risk of further decline. 4. Maintaining a regular schedule/routine of daily activities was recommended in order to maximize cognitive functioning.   Feedback to Patient: Dean Pruitt returned for a feedback appointment on 04/20/2018 to review the results of his neuropsychological evaluation with this provider. 25 minutes face-to-face time was spent reviewing his test results, my impressions and my recommendations as detailed above.    Total time spent on this patient's case: 60 minutes for neurobehavioral status exam with psychologist (CPT code [id number]); 90 minutes of testing/scoring by psychometrician under psychologist's supervision (CPT codes [phone], [phone] units); 180 minutes for integration of patient data, interpretation of standardized test results and clinical data, clinical decision making, treatment planning and preparation of this report, and interactive feedback with review of results to the patient/family by psychologist (CPT codes [phone], [phone] units).      Thank you for your referral of Dean Pruitt. Please feel free to contact me if you have any questions or concerns regarding this report.

## 2018-04-20 ENCOUNTER — Ambulatory Visit (INDEPENDENT_AMBULATORY_CARE_PROVIDER_SITE_OTHER): Payer: PPO | Admitting: Psychology

## 2018-04-20 ENCOUNTER — Telehealth: Payer: Self-pay

## 2018-04-20 ENCOUNTER — Encounter: Payer: Self-pay | Admitting: Psychology

## 2018-04-20 DIAGNOSIS — F0150 Vascular dementia without behavioral disturbance: Secondary | ICD-10-CM

## 2018-04-20 NOTE — Telephone Encounter (Signed)
Called and LMOVM asking for Pt to return my call.

## 2018-04-20 NOTE — Patient Instructions (Signed)
Your cognitive profile of strengths and weaknesses is similar to 2017. There were some declines relative to 2017 in memory encoding/retrieval and word finding. Cognitive changes are most likely due to changes to the small blood vessels in the brain (vascular cognitive impairment ie vascular dementia). This can happen as we age and is more likely to happen if we have a history of things like high blood pressure and high cholesterol.  Fortunately, you seem to be functioning quite well in daily life and getting the appropriate support with things that are more challenging/complex (e.g., medications, finances, appointments). I recommend you continue to limit your driving to familiar areas in a close geographic proximity and avoid nighttime driving. I also encourage you to continue to engage in activities that provide social interaction and safe cardiovascular exercise, as both of these things are important for brain health and quality of life. Having a daily routine or schedule of activities will help you keep up on these activities and help maximize cognitive functioning.

## 2018-04-20 NOTE — Telephone Encounter (Signed)
-----   Message from Pieter Partridge, DO sent at 04/20/2018 11:10 AM EDT ----- At this time, I wouldn't add any new medication.  Mr. Gangemi should continue donepezil 10mg  at bedtime, daily aspirin and his statin to control cholesterol. ----- Message ----- From: Kandis Nab, PsyD Sent: 04/20/2018  10:28 AM To: Pieter Partridge, DO  Hi there! Pt and his daughter just had follow up with me today to review results of re-evaluation. They would like to know if there is any medication change recommended at this point (ie any increase in donepezil or addition of any other medication) and requested a call by [facility] to advise them if there is. Thanks! MB

## 2018-04-21 ENCOUNTER — Telehealth: Payer: Self-pay

## 2018-04-21 NOTE — Telephone Encounter (Signed)
-----   Message from Pieter Partridge, DO sent at 04/20/2018 11:10 AM EDT ----- At this time, I wouldn't add any new medication.  Dean Pruitt should continue donepezil 10mg  at bedtime, daily aspirin and his statin to control cholesterol. ----- Message ----- From: Kandis Nab, PsyD Sent: 04/20/2018  10:28 AM To: Pieter Partridge, DO  Hi there! Pt and his daughter just had follow up with me today to review results of re-evaluation. They would like to know if there is any medication change recommended at this point (ie any increase in donepezil or addition of any other medication) and requested a call by [facility] to advise them if there is. Thanks! MB

## 2018-04-21 NOTE — Telephone Encounter (Signed)
Called and spoke with Pt, gave him recommendations. Pt verbalized understanding.

## 2018-04-25 ENCOUNTER — Telehealth: Payer: Self-pay | Admitting: Neurology

## 2018-04-25 NOTE — Telephone Encounter (Signed)
Pt's spouse left a VM message wanting test results for pt CB# [phone] or [phone]

## 2018-04-25 NOTE — Progress Notes (Signed)
[facility] [place],  [address] Phone: [phone] Subjective:     CC: Hip and back pain follow-up  CWC:[id number]  Dean Pruitt is a 84 y.o. male coming in with complaint of back pain hip pain.  Patient has had some difficulty with this for quite some time.  Seems to have more of a nerve impingement of the back but patient's back MRI only showed arthritic changes.  Has responded fairly well to greater trochanteric bursitis.  Last injection was greater than 3 months ago.  States that he is doing relatively well.       Past Medical History:  Diagnosis Date  . Cataract   . Depression    situational  . GERD (gastroesophageal reflux disease)   . Goals of care, counseling/discussion 03/20/2017  . Hyperlipidemia   . Hypothyroidism   . Reactive depression 03/20/2017  . Thyroid cancer ([facility])    PMH of; on supressive therapy   Past Surgical History:  Procedure Laterality Date  . CATARACT EXTRACTION, BILATERAL  2013 & 2014   Dr Celene Squibb  . colon polyps  2002 & 2005   negative 2008; Dr Earlean Shawl  . COLONOSCOPY W/ POLYPECTOMY    . LUNG BIOPSY Left 02/15/2017   Procedure: LUNG BIOPSY;  Surgeon: Melrose Nakayama, MD;  Location: [facility];  Service: Thoracic;  Laterality: Left;  . PLEURAL EFFUSION DRAINAGE Left 02/15/2017   Procedure: DRAINAGE OF PLEURAL EFFUSION;  Surgeon: Melrose Nakayama, MD;  Location: [facility];  Service: Thoracic;  Laterality: Left;  . PORTACATH PLACEMENT N/A 03/25/2017   Procedure: INSERTION PORT-A-CATH, right internal jugular;  Surgeon: Melrose Nakayama, MD;  Location: [facility];  Service: Thoracic;  Laterality: N/A;  . THYROIDECTOMY  2001   S/P RAI  . VIDEO ASSISTED THORACOSCOPY Left 02/15/2017   Procedure: VIDEO ASSISTED THORACOSCOPY;  Surgeon: Melrose Nakayama, MD;  Location: [facility];  Service: Thoracic;  Laterality: Left;   Social History   Socioeconomic History  . Marital status: Married    Spouse name:  Not on file  . Number of children: 2  . Years of education: Not on file  . Highest education level: Not on file  Occupational History  . Occupation: Retired  Scientific laboratory technician  . Financial resource strain: Not on file  . Food insecurity:    Worry: Not on file    Inability: Not on file  . Transportation needs:    Medical: Not on file    Non-medical: Not on file  Tobacco Use  . Smoking status: Former Smoker    Packs/day: 1.00    Years: 5.00    Pack years: 5.00    Types: Cigarettes    Last attempt to quit: 12/07/1958    Years since quitting: 59.4  . Smokeless tobacco: Never Used  . Tobacco comment: smoked 1956-1960, up 1 ppd  Substance and Sexual Activity  . Alcohol use: Yes    Alcohol/week: 8.4 oz    Types: 14 Glasses of wine per week  . Drug use: No  . Sexual activity: Not on file  Lifestyle  . Physical activity:    Days per week: Not on file    Minutes per session: Not on file  . Stress: Not on file  Relationships  . Social connections:    Talks on phone: Not on file    Gets together: Not on file    Attends religious service: Not on file    Active member of  club or organization: Not on file    Attends meetings of clubs or organizations: Not on file    Relationship status: Not on file  Other Topics Concern  . Not on file  Social History Narrative   Exercise:  Yard work, walks on occasion               Allergies  Allergen Reactions  . Ativan [Lorazepam] Other (See Comments)    Mental status change-"went crazy in the head"  . Fentanyl Other (See Comments)    hallucinations  . Tramadol Other (See Comments)    Altered mental status "crazy in the head'   Family History  Problem Relation Age of Onset  . Alzheimer's disease Mother   . Hypertension Mother   . Heart disease Father        CAD and angioplasty in 15s  . Cancer Father        Bladder  . Other Brother        valvular heart disease  . Diabetes Neg Hx   . Stroke Neg Hx      Past medical history,  social, surgical and family history all reviewed in electronic medical record.  No pertanent information unless stated regarding to the chief complaint.   Review of Systems:Review of systems updated and as accurate as of 04/26/18  No headache, visual changes, nausea, vomiting, diarrhea, constipation, dizziness, abdominal pain, skin rash, fevers, chills, night sweats, weight loss, swollen lymph nodes, body aches, joint swelling, chest pain, shortness of breath, mood changes.  Positive bruising and mild muscle aches  Objective  Blood pressure 100/66, pulse 78, height 5\' 10"  (1.778 m), weight 209 lb (94.8 kg), SpO2 91 %. Systems examined below as of 04/26/18   General: No apparent distress alert and oriented x3 mood and affect normal, dressed appropriately.  HEENT: Pupils equal, extraocular movements intact  Respiratory: Patient's speak in full sentences and does not appear short of breath  Cardiovascular: No lower extremity edema, non tender, no erythema  Skin: Warm dry intact with no signs of infection or rash on extremities or on axial skeleton.  Abdomen: Soft nontender  Neuro: Cranial nerves II through XII are intact, neurovascularly intact in all extremities with 2+ DTRs and 2+ pulses.  Lymph: No lymphadenopathy of posterior or anterior cervical chain or axillae bilaterally.  Gait normal with good balance and coordination.  MSK:  Non tender with full range of motion and good stability and symmetric strength and tone of shoulders, elbows, wrist,  knee and ankles bilaterally.  Arthritic changes of multiple joints Tenderness very mild over the lateral aspect of the hip.  Patient does have tightness with Corky Sox.  Mild positive straight leg test.     Impression and Recommendations:     This case required medical decision making of moderate complexity.      Note: This dictation was prepared with Dragon dictation along with smaller phrase technology. Any transcriptional errors that result  from this process are unintentional.

## 2018-04-26 ENCOUNTER — Encounter: Payer: Self-pay | Admitting: Family Medicine

## 2018-04-26 ENCOUNTER — Ambulatory Visit (INDEPENDENT_AMBULATORY_CARE_PROVIDER_SITE_OTHER): Payer: PPO | Admitting: Family Medicine

## 2018-04-26 DIAGNOSIS — M7061 Trochanteric bursitis, right hip: Secondary | ICD-10-CM | POA: Diagnosis not present

## 2018-04-26 NOTE — Patient Instructions (Signed)
Good to see you  I am glad you are doing better Aspercreme should do well  Arnica lotion 2 times daily  See me again in 3-4 months

## 2018-04-26 NOTE — Assessment & Plan Note (Signed)
Patient is stable at this time.  Discussed icing regimen and home exercise.  Discussed which activities of doing which wants to avoid.  Patient will increase activity as tolerated.  Follow-up again in 4 months.  Still concerned with some of it being back pain and radicular symptoms.

## 2018-05-04 NOTE — Telephone Encounter (Signed)
Called and spoke with daughter, Cecille Rubin. Pt's wife passed last December. Daughter wanted to know if Dr Tomi Likens had recommended any additional medications. I advsd her not at this time.

## 2018-05-05 ENCOUNTER — Other Ambulatory Visit: Payer: Self-pay | Admitting: *Deleted

## 2018-05-05 ENCOUNTER — Other Ambulatory Visit: Payer: Self-pay | Admitting: Family Medicine

## 2018-05-05 DIAGNOSIS — C450 Mesothelioma of pleura: Secondary | ICD-10-CM

## 2018-05-06 ENCOUNTER — Ambulatory Visit (HOSPITAL_COMMUNITY)
Admission: RE | Admit: 2018-05-06 | Discharge: 2018-05-06 | Disposition: A | Discharge status: Home / Self Care | Payer: PPO | Source: Ambulatory Visit | Attending: Internal Medicine | Admitting: Internal Medicine

## 2018-05-06 ENCOUNTER — Inpatient Hospital Stay: Payer: PPO

## 2018-05-06 ENCOUNTER — Inpatient Hospital Stay: Payer: PPO | Attending: Internal Medicine

## 2018-05-06 DIAGNOSIS — C450 Mesothelioma of pleura: Secondary | ICD-10-CM | POA: Diagnosis not present

## 2018-05-06 DIAGNOSIS — J984 Other disorders of lung: Secondary | ICD-10-CM | POA: Insufficient documentation

## 2018-05-06 DIAGNOSIS — I712 Thoracic aortic aneurysm, without rupture: Secondary | ICD-10-CM | POA: Diagnosis not present

## 2018-05-06 DIAGNOSIS — I700 Atherosclerosis of aorta: Secondary | ICD-10-CM | POA: Diagnosis not present

## 2018-05-06 DIAGNOSIS — R918 Other nonspecific abnormal finding of lung field: Secondary | ICD-10-CM | POA: Diagnosis not present

## 2018-05-06 DIAGNOSIS — C459 Mesothelioma, unspecified: Secondary | ICD-10-CM | POA: Diagnosis not present

## 2018-05-06 DIAGNOSIS — Z452 Encounter for adjustment and management of vascular access device: Secondary | ICD-10-CM | POA: Insufficient documentation

## 2018-05-06 DIAGNOSIS — I2510 Atherosclerotic heart disease of native coronary artery without angina pectoris: Secondary | ICD-10-CM | POA: Diagnosis not present

## 2018-05-06 DIAGNOSIS — Z5111 Encounter for antineoplastic chemotherapy: Secondary | ICD-10-CM | POA: Diagnosis not present

## 2018-05-06 DIAGNOSIS — K760 Fatty (change of) liver, not elsewhere classified: Secondary | ICD-10-CM | POA: Insufficient documentation

## 2018-05-06 LAB — CBC WITH DIFFERENTIAL (CANCER CENTER ONLY)
Basophils Absolute: 0.1 10*3/uL (ref 0.0–0.1)
Basophils Relative: 1 %
Eosinophils Absolute: 0.1 10*3/uL (ref 0.0–0.5)
Eosinophils Relative: 1 %
HEMATOCRIT: 44.7 % (ref 38.4–49.9)
HEMOGLOBIN: 15.3 g/dL (ref 13.0–17.1)
LYMPHS ABS: 1.1 10*3/uL (ref 0.9–3.3)
Lymphocytes Relative: 16 %
MCH: 33.9 pg — AB (ref 27.2–33.4)
MCHC: 34.1 g/dL (ref 32.0–36.0)
MCV: 99.4 fL — ABNORMAL HIGH (ref 79.3–98.0)
MONOS PCT: 12 %
Monocytes Absolute: 0.8 10*3/uL (ref 0.1–0.9)
NEUTROS ABS: 5.1 10*3/uL (ref 1.5–6.5)
NEUTROS PCT: 70 %
Platelet Count: 155 10*3/uL (ref 140–400)
RBC: 4.5 MIL/uL (ref 4.20–5.82)
RDW: 13.4 % (ref 11.0–14.6)
WBC Count: 7.2 10*3/uL (ref 4.0–10.3)

## 2018-05-06 LAB — CMP (CANCER CENTER ONLY)
ALK PHOS: 61 U/L (ref 40–150)
ALT: 15 U/L (ref 0–55)
ANION GAP: 13 — AB (ref 3–11)
AST: 29 U/L (ref 5–34)
Albumin: 3.9 g/dL (ref 3.5–5.0)
BILIRUBIN TOTAL: 1.7 mg/dL — AB (ref 0.2–1.2)
BUN: 23 mg/dL (ref 7–26)
CALCIUM: 8.9 mg/dL (ref 8.4–10.4)
CO2: 25 mmol/L (ref 22–29)
Chloride: 104 mmol/L (ref 98–109)
Creatinine: 1.25 mg/dL (ref 0.70–1.30)
GFR, EST NON AFRICAN AMERICAN: 52 mL/min — AB (ref >60)
Glucose, Bld: 112 mg/dL (ref 70–140)
Potassium: 3.5 mmol/L (ref 3.5–5.1)
Sodium: 142 mmol/L (ref 136–145)
Total Protein: 6.4 g/dL (ref 6.4–8.3)

## 2018-05-06 MED ORDER — SODIUM CHLORIDE 0.9 % IJ SOLN
10.0000 mL | Freq: Once | INTRAMUSCULAR | Status: AC
  Administered 2018-05-06: 10 mL
  Filled 2018-05-06: qty 10

## 2018-05-06 MED ORDER — IOHEXOL 300 MG/ML  SOLN
75.0000 mL | Freq: Once | INTRAMUSCULAR | Status: AC | PRN
  Administered 2018-05-06: 75 mL via INTRAVENOUS

## 2018-05-06 MED ORDER — HEPARIN SOD (PORK) LOCK FLUSH 100 UNIT/ML IV SOLN
500.0000 [IU] | Freq: Once | INTRAVENOUS | Status: AC
  Administered 2018-05-06: 500 [IU]
  Filled 2018-05-06: qty 5

## 2018-05-06 NOTE — Telephone Encounter (Signed)
Refill done.  

## 2018-05-09 ENCOUNTER — Telehealth: Payer: Self-pay | Admitting: Internal Medicine

## 2018-05-09 ENCOUNTER — Encounter: Payer: Self-pay | Admitting: Internal Medicine

## 2018-05-09 ENCOUNTER — Inpatient Hospital Stay: Payer: PPO | Attending: Internal Medicine | Admitting: Internal Medicine

## 2018-05-09 VITALS — BP 92/63 | HR 77 | Temp 97.6°F | Resp 18 | Ht 70.0 in | Wt 200.4 lb

## 2018-05-09 DIAGNOSIS — C450 Mesothelioma of pleura: Secondary | ICD-10-CM

## 2018-05-09 DIAGNOSIS — Z85850 Personal history of malignant neoplasm of thyroid: Secondary | ICD-10-CM

## 2018-05-09 DIAGNOSIS — R530 Neoplastic (malignant) related fatigue: Secondary | ICD-10-CM | POA: Insufficient documentation

## 2018-05-09 DIAGNOSIS — Z7982 Long term (current) use of aspirin: Secondary | ICD-10-CM | POA: Diagnosis not present

## 2018-05-09 DIAGNOSIS — C3490 Malignant neoplasm of unspecified part of unspecified bronchus or lung: Secondary | ICD-10-CM

## 2018-05-09 DIAGNOSIS — Z79899 Other long term (current) drug therapy: Secondary | ICD-10-CM | POA: Insufficient documentation

## 2018-05-09 NOTE — Telephone Encounter (Signed)
Scheduled appt per 6/3 los -sent reminder letter in the mail

## 2018-05-09 NOTE — Progress Notes (Signed)
[facility] Telephone:([phone]) [phone]   Fax:(336) [phone]  OFFICE PROGRESS NOTE  [name], MD [place] [address]  DIAGNOSIS: Stage II malignant pleural mesothelioma, epithelioid type involving the left hemithorax diagnosed in March 2018.  PRIOR THERAPY:  1) Status post left VATS with biopsy and wedge resection of the left lower lobe as well as parietal pleurectomy under the care of Dr. Roxan Hockey on 02/15/2017. 2) Palliative systemic chemotherapy with carboplatin for AUC of 5, Alimta 500 MG/M2 and Avastin 15 MG/KG every 3 weeks. Status post 6 cycles.  CURRENT THERAPY: Observation.  INTERVAL HISTORY: Dean [facility] [place] 84 y.o. male returns to the clinic today for follow-up visit accompanied by his daughter.  The patient is feeling fine today with no specific complaints except for few recent falls.  He denied having any chest pain, shortness of breath, cough or hemoptysis.  He denied having any fever or chills.  He has no nausea, vomiting, diarrhea or constipation.  He has no recent weight loss or night sweats.  The patient had repeat CT scan of the chest performed recently and is here for evaluation and discussion of his discuss results.  MEDICAL HISTORY: Past Medical History:  Diagnosis Date  . Cataract   . Depression    situational  . GERD (gastroesophageal reflux disease)   . Goals of care, counseling/discussion 03/20/2017  . Hyperlipidemia   . Hypothyroidism   . Reactive depression 03/20/2017  . Thyroid cancer ([facility])    PMH of; on supressive therapy    ALLERGIES:  is allergic to ativan [lorazepam]; fentanyl; and tramadol.  MEDICATIONS:  Current Outpatient Medications  Medication Sig Dispense Refill  . aspirin 81 MG tablet Take 81 mg by mouth daily.    Marland Kitchen donepezil (ARICEPT) 10 MG tablet TAKE 1 TABLET BY MOUTH AT BEDTIME 90 tablet 0  . escitalopram (LEXAPRO) 20 MG tablet TAKE 1 TABLET BY MOUTH AT BEDTIME 90 tablet 1  . gabapentin  (NEURONTIN) 300 MG capsule TAKE ONE CAPSULE BY MOUTH AT NIGHT 90 capsule 3  . levothyroxine (SYNTHROID, LEVOTHROID) 150 MCG tablet TAKE 1 TABLET BY MOUTH ONCE DAILY MON- FRI AND 1/2 TABLET ON SAT & SUN 90 tablet 1  . lidocaine-prilocaine (EMLA) cream Apply 1 application topically as needed. (Patient taking differently: Apply 1 application topically as needed (port access). ) 30 g 0  . meloxicam (MOBIC) 7.5 MG tablet TAKE 1 TABLET BY MOUTH ONCE DAILY 90 tablet 0  . omeprazole (PRILOSEC) 20 MG capsule Take 1 capsule (20 mg total) by mouth daily. 90 capsule 1  . rosuvastatin (CRESTOR) 20 MG tablet Take 1 tablet (20 mg total) by mouth daily. 90 tablet 3  . oxyCODONE (OXY IR/ROXICODONE) 5 MG immediate release tablet Take 1 tablet (5 mg total) by mouth every 6 (six) hours as needed for severe pain. (Patient not taking: Reported on 05/09/2018) 30 tablet 0   No current facility-administered medications for this visit.     SURGICAL HISTORY:  Past Surgical History:  Procedure Laterality Date  . CATARACT EXTRACTION, BILATERAL  2013 & 2014   Dr Celene Squibb  . colon polyps  2002 & 2005   negative 2008; Dr Earlean Shawl  . COLONOSCOPY W/ POLYPECTOMY    . LUNG BIOPSY Left 02/15/2017   Procedure: LUNG BIOPSY;  Surgeon: Melrose Nakayama, MD;  Location: [facility];  Service: Thoracic;  Laterality: Left;  . PLEURAL EFFUSION DRAINAGE Left 02/15/2017   Procedure: DRAINAGE OF PLEURAL EFFUSION;  Surgeon: Melrose Nakayama, MD;  Location: [facility];  Service: Thoracic;  Laterality: Left;  . PORTACATH PLACEMENT N/A 03/25/2017   Procedure: INSERTION PORT-A-CATH, right internal jugular;  Surgeon: Melrose Nakayama, MD;  Location: [facility];  Service: Thoracic;  Laterality: N/A;  . THYROIDECTOMY  2001   S/P RAI  . VIDEO ASSISTED THORACOSCOPY Left 02/15/2017   Procedure: VIDEO ASSISTED THORACOSCOPY;  Surgeon: Melrose Nakayama, MD;  Location: [facility];  Service: Thoracic;  Laterality: Left;    REVIEW OF SYSTEMS:  A comprehensive  review of systems was negative except for: Constitutional: positive for fatigue   PHYSICAL EXAMINATION: General appearance: alert, cooperative, fatigued and no distress Head: Normocephalic, without obvious abnormality, atraumatic Neck: no adenopathy, no JVD, supple, symmetrical, trachea midline and thyroid not enlarged, symmetric, no tenderness/mass/nodules Lymph nodes: Cervical, supraclavicular, and axillary nodes normal. Resp: clear to auscultation bilaterally Back: symmetric, no curvature. ROM normal. No CVA tenderness. Cardio: regular rate and rhythm, S1, S2 normal, no murmur, click, rub or gallop GI: soft, non-tender; bowel sounds normal; no masses,  no organomegaly Extremities: extremities normal, atraumatic, no cyanosis or edema  ECOG PERFORMANCE STATUS: 1 - Symptomatic but completely ambulatory  Blood pressure 92/63, pulse 77, temperature 97.6 F (36.4 C), temperature source Oral, resp. rate 18, height 5\' 10"  (1.778 m), weight 200 lb 6.4 oz (90.9 kg), SpO2 95 %.  LABORATORY DATA: Lab Results  Component Value Date   WBC 7.2 05/06/2018   HGB 15.3 05/06/2018   HCT 44.7 05/06/2018   MCV 99.4 (H) 05/06/2018   PLT 155 05/06/2018      Chemistry      Component Value Date/Time   NA 142 05/06/2018 1204   NA 140 11/04/2017 1210   K 3.5 05/06/2018 1204   K 4.1 11/04/2017 1210   CL 104 05/06/2018 1204   CO2 25 05/06/2018 1204   CO2 26 11/04/2017 1210   BUN 23 05/06/2018 1204   BUN 20.1 11/04/2017 1210   CREATININE 1.25 05/06/2018 1204   CREATININE 1.2 11/04/2017 1210      Component Value Date/Time   CALCIUM 8.9 05/06/2018 1204   CALCIUM 9.6 11/04/2017 1210   ALKPHOS 61 05/06/2018 1204   ALKPHOS 62 11/04/2017 1210   AST 29 05/06/2018 1204   AST 37 (H) 11/04/2017 1210   ALT 15 05/06/2018 1204   ALT 22 11/04/2017 1210   BILITOT 1.7 (H) 05/06/2018 1204   BILITOT 1.32 (H) 11/04/2017 1210       RADIOGRAPHIC STUDIES: Ct Chest W Contrast  Result Date: 05/09/2018 CLINICAL  DATA:  Mesothelioma. Left VATS with palliative chemotherapy. EXAM: CT CHEST WITH CONTRAST TECHNIQUE: Multidetector CT imaging of the chest was performed during intravenous contrast administration. CONTRAST:  19mL OMNIPAQUE IOHEXOL 300 MG/ML  SOLN COMPARISON:  CT chest 02/04/2018. FINDINGS: Cardiovascular: Right IJ Port-A-Cath terminates in the low SVC. Atherosclerotic calcification of the arterial vasculature, including extensive three-vessel involvement of the coronary arteries. Ascending aorta measures 4.8 cm. Heart is enlarged. No pericardial effusion. Mediastinum/Nodes: Thyroidectomy. Mediastinal lymph nodes measure up to 11 mm in the low right paratracheal station, similar. Right hilar lymph node measures 10 mm also similar. No axillary adenopathy. Esophagus is grossly unremarkable. Lungs/Pleura: Mild postoperative pleuroparenchymal scarring in the left hemithorax. Mild residual pleural thickening at the base of the left hemithorax as well as within the apical left hemithorax, unchanged. No pleural fluid. Airway is unremarkable. Upper Abdomen: Liver appears decreased in attenuation diffusely. Gallbladder and adrenal glands are unremarkable. Visualized portions of the  kidneys, spleen pancreas are unremarkable. Small hiatal hernia. Visualized portions of the stomach bowel are grossly unremarkable. No upper abdominal adenopathy. Musculoskeletal: Degenerative changes in the spine. No worrisome lytic or sclerotic lesions. IMPRESSION: 1. Postoperative pleuroparenchymal scarring in the left hemithorax with trace residual pleural thickening, as before. 2. Aortic atherosclerosis (ICD10-170.0). Three-vessel coronary artery calcification. 3. Ascending Aortic aneurysm NOS (ICD10-I71.9). Ascending thoracic aortic aneurysm. Recommend semi-annual imaging followup by CTA or MRA and referral to cardiothoracic surgery if not already obtained. This recommendation follows 2010 ACCF/AHA/AATS/ACR/ASA/SCA/SCAI/SIR/STS/SVM Guidelines  for the Diagnosis and Management of Patients With Thoracic Aortic Disease. Circulation. 2010; 121: Z610-R604. 4. Hepatic steatosis. Electronically Signed   By: Lorin Picket M.D.   On: 05/09/2018 08:04    ASSESSMENT AND PLAN:  This is a very pleasant 84 years old white male with malignant pleural mesothelioma involving the left hemothorax. He completed treatment with systemic chemotherapy with carboplatin, Alimta and Avastin status post 6 cycles. He has been tolerating the treatment fairly well with no significant adverse effects except for fatigue. The patient is currently on observation and he is feeling fine. Repeat CT scan of the chest showed no concerning findings for disease progression. I discussed the scan results with the patient and his daughter and recommended for him to continue on observation with repeat CT scan of the chest in 6 months. He was advised to call immediately if he has any concerning symptoms in the interval. The patient voices understanding of current disease status and treatment options and is in agreement with the current care plan. All questions were answered. The patient knows to call the clinic with any problems, questions or concerns. We can certainly see the patient much sooner if necessary.  Disclaimer: This note was dictated with voice recognition software. Similar sounding words can inadvertently be transcribed and may not be corrected upon review.

## 2018-05-13 IMAGING — DX DG LUMBAR SPINE COMPLETE 4+V
5 series · 5 of 5 positions shown · non-contrast
Comparison: None in PACs

CLINICAL DATA: Left-sided sciatic symptoms and pain for the past 2
months since a fall.

EXAM:
LUMBAR SPINE - COMPLETE 4+ VIEW

[l-spine ap]
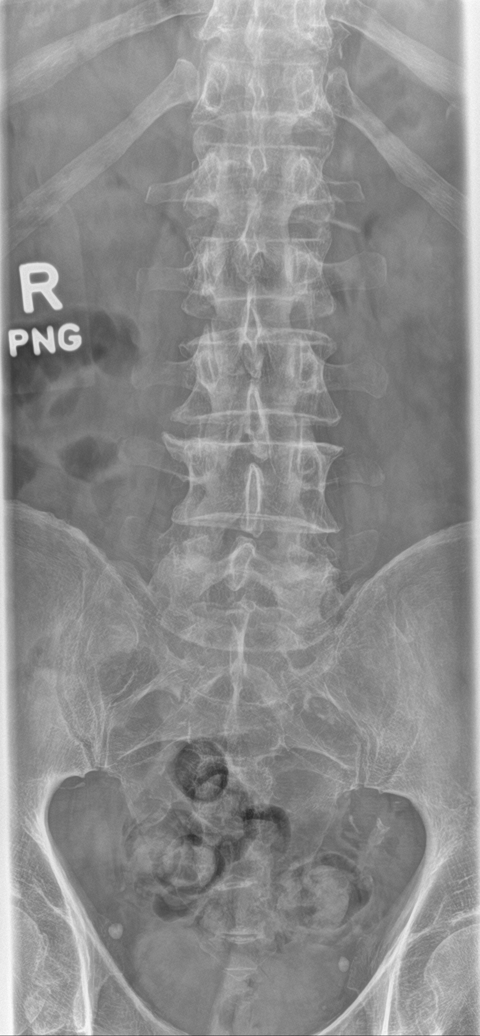

[l-spine obl (1 of 2)]
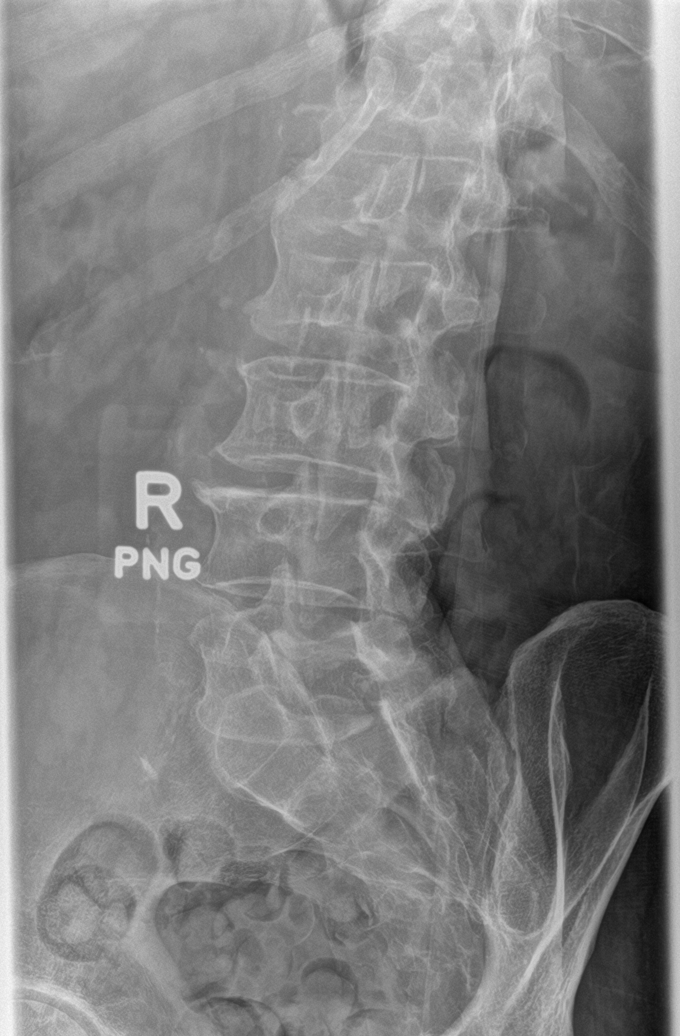

[l-spine obl (2 of 2)]
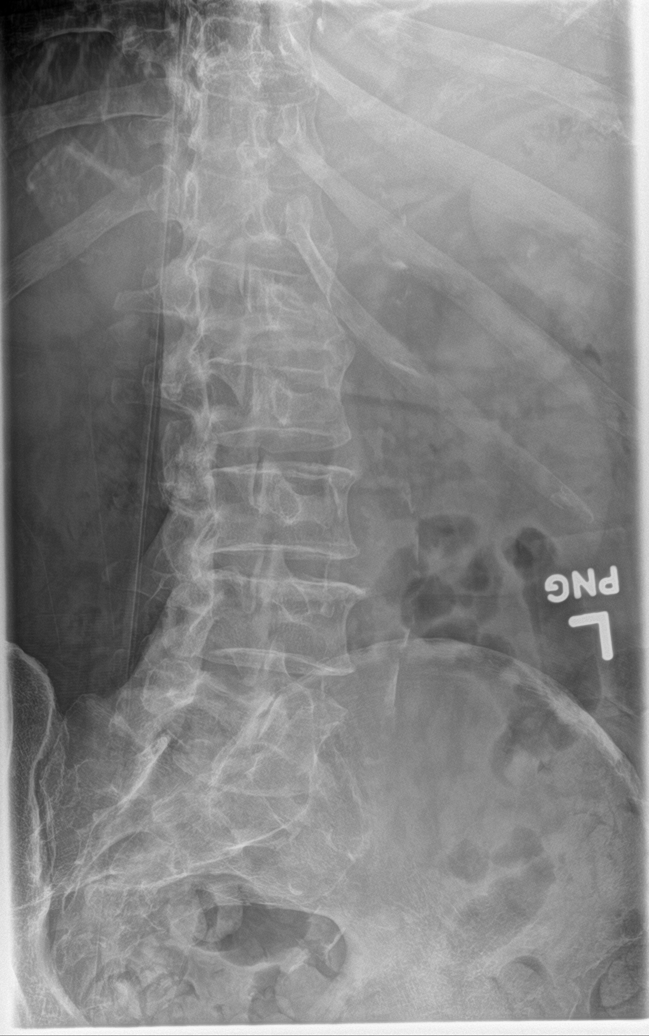

[l-spine lat]
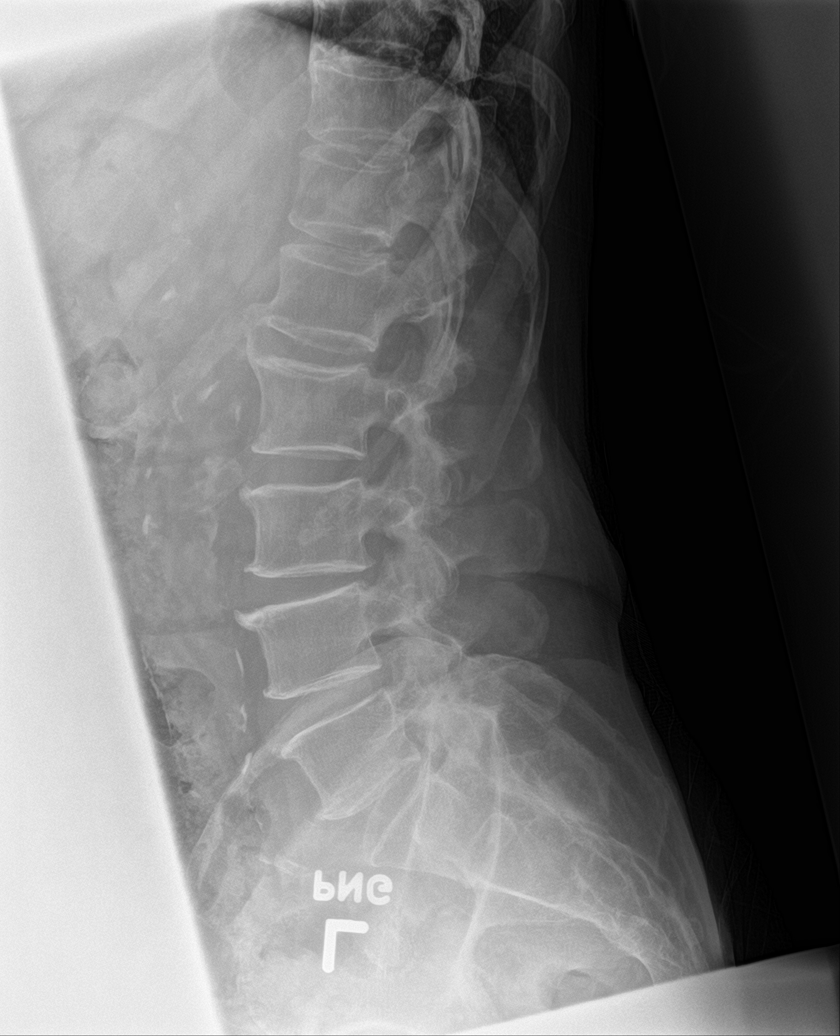

[l-spine spot]
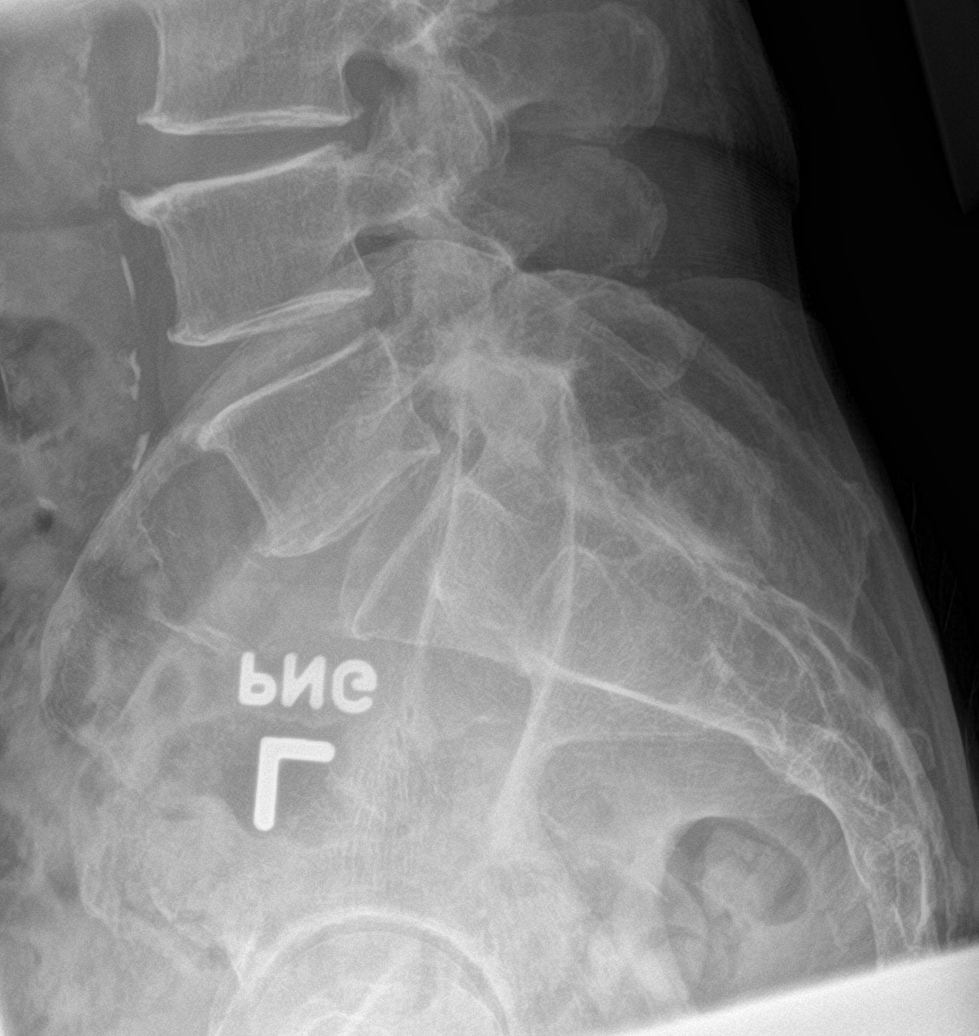

[5 of 5 positions shown; findings below may reference images not displayed]

FINDINGS: The lumbar vertebral bodies are preserved in height. The disc space
heights are reasonably well-maintained. Small anterior endplate
osteophytes are present at multiple levels. There is no
spondylolisthesis. There is facet joint hypertrophy at L4-5 and
L5-S1. The pedicles and transverse processes are intact. The
observed portions of the sacrum are normal. There are calcifications
in the wall of the abdominal aorta and common iliac arteries.
IMPRESSION: Multilevel endplate spurring. No compression fracture nor high-grade
disc space narrowing. Mild facet joint hypertrophy at L4-5 and
L5-S1.

Aortoiliac atherosclerosis. Probable abdominal aortic aneurysm
measuring up to 3.7 cm in diameter. Abdominal aortic ultrasound is
recommended.

## 2018-05-16 ENCOUNTER — Other Ambulatory Visit: Payer: Self-pay | Admitting: Internal Medicine

## 2018-06-02 NOTE — Addendum Note (Signed)
Encounter addended by: Harlene Ramus on: 06/02/2018 2:59 PM  Actions taken: Imaging Exam ended

## 2018-06-04 ENCOUNTER — Other Ambulatory Visit: Payer: Self-pay | Admitting: Neurology

## 2018-06-06 ENCOUNTER — Inpatient Hospital Stay: Payer: PPO | Attending: Internal Medicine

## 2018-06-06 DIAGNOSIS — Z95828 Presence of other vascular implants and grafts: Secondary | ICD-10-CM

## 2018-06-06 DIAGNOSIS — C450 Mesothelioma of pleura: Secondary | ICD-10-CM | POA: Diagnosis not present

## 2018-06-06 DIAGNOSIS — Z452 Encounter for adjustment and management of vascular access device: Secondary | ICD-10-CM | POA: Diagnosis not present

## 2018-06-06 MED ORDER — HEPARIN SOD (PORK) LOCK FLUSH 100 UNIT/ML IV SOLN
500.0000 [IU] | Freq: Once | INTRAVENOUS | Status: AC
  Administered 2018-06-06: 500 [IU]
  Filled 2018-06-06: qty 5

## 2018-06-06 MED ORDER — SODIUM CHLORIDE 0.9% FLUSH
10.0000 mL | Freq: Once | INTRAVENOUS | Status: AC
  Administered 2018-06-06: 10 mL
  Filled 2018-06-06: qty 10

## 2018-06-13 ENCOUNTER — Other Ambulatory Visit: Payer: Self-pay | Admitting: Neurology

## 2018-06-13 DIAGNOSIS — H1820 Unspecified corneal edema: Secondary | ICD-10-CM | POA: Diagnosis not present

## 2018-06-13 DIAGNOSIS — Z961 Presence of intraocular lens: Secondary | ICD-10-CM | POA: Diagnosis not present

## 2018-06-13 DIAGNOSIS — H353121 Nonexudative age-related macular degeneration, left eye, early dry stage: Secondary | ICD-10-CM | POA: Diagnosis not present

## 2018-06-14 ENCOUNTER — Encounter: Payer: PPO | Admitting: Psychology

## 2018-06-14 ENCOUNTER — Encounter

## 2018-06-30 ENCOUNTER — Ambulatory Visit (INDEPENDENT_AMBULATORY_CARE_PROVIDER_SITE_OTHER): Payer: PPO | Admitting: Neurology

## 2018-06-30 ENCOUNTER — Encounter: Payer: Self-pay | Admitting: Neurology

## 2018-06-30 VITALS — BP 128/78 | HR 64 | Ht 70.5 in | Wt 199.0 lb

## 2018-06-30 DIAGNOSIS — F0150 Vascular dementia without behavioral disturbance: Secondary | ICD-10-CM | POA: Diagnosis not present

## 2018-06-30 NOTE — Progress Notes (Signed)
NEUROLOGY FOLLOW UP OFFICE NOTE  [place] [id number]  HISTORY OF PRESENT ILLNESS: Dean Pruitt is an 84 year old right-handed male with hyperlipidemia, elevated blood pressure and history of thyroid cancer s/p thyroidectomy who follows up for probable Alzheimer's dementia.  He is accompanied by his daughter who supplements history.   UPDATE: He underwent repeat neuropsychological testing on 03/01/18.  He demonstrated interval decline in multiple domains of cognitive functioning.  As he has difficulty managing finances and medications independently, he has met criteria for mild vascular dementia.    He is taking Aricept 80m at bedtime.  He is feeling well.   HISTORY: His wife started noticing problems with memory in 2016.  He recognizes memory problems too, but is not as concerned as his wife.  He started to become disoriented driving on familiar routes.  From church, he would be unsure how to get to known restaurants.  He had trouble driving to his daughter's house, as well.  Once in a while, hew would misplace things, such as his checkbook.  Over the span of a few months, he forgot to pay some pills three times.  He has since set up auto-payments.  This past year, he began to struggle with paying his taxes.  He sets up his pillbox for the week every week, which works out well.  He once saw a close friend from church whom he hasn't seen for several years.  He recognized him but couldn't remember his name.  Sleeping is poor.  He drinks 2 glasses of wine a day.    He underwent neuropsychological testing on 04/14/16.  Results were overall within normal limits, however there was evidence of mild executive dysfunction, as demonstrated with abnormalities in mental flexibility and set shifting, verbal fluency and clock drawing.  Although stress was likely the primary cause of his memory problems, these findings also suggest an underlying non-amnestic mild cognitive impairment.    He had  been receiving B12 supplementation.  He was diagnosed with malignant mesothelioma in March 2018 and had been undergoing chemotherapy.  This past A04-20-2024 his grandson passed away.  Over the past year, he has had cognitive decline with confusion and recurrent falls.  He has had difficulty using common devices, such as the telephone and TV remote.  His family manages his finances and his medications.  He rarely drives.  He is able to dress, bathe and use the toilet himself.  His daughter stays with him and his wife.  He is unaware that his wife with ovarian cancer will be transferred to hospice  MRI of brain with and without contrast from 07/19/17 revealed moderate chronic small vessel disease but no evidence of metastasis or stroke. Recent B12 was 411.      B12 from 02/13/16 was 326; from 10/13/17 was 411. TSH from 10/13/17 was 6.10. MRI of brain from 02/26/16 revealed age-related generalized cerebral volume loss and mild nonspecific white matter changes. MRI of brain with and without contrast from 07/19/17  Showed generalized atrophy and moderate chronic small vessel ischemic changes.   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  PAST MEDICAL HISTORY: Past Medical History:  Diagnosis Date  . Cataract   . Depression    situational  . GERD (gastroesophageal reflux disease)   . Goals of care, counseling/discussion 03/20/2017  . Hyperlipidemia   . Hypothyroidism   . Reactive depression 03/20/2017  . Thyroid cancer ([facility]    PMH of; on supressive therapy  MEDICATIONS: Current Outpatient Medications on File Prior to Visit  Medication Sig Dispense Refill  . aspirin 81 MG tablet Take 81 mg by mouth daily.    Marland Kitchen donepezil (ARICEPT) 10 MG tablet TAKE 1 TABLET BY MOUTH AT BEDTIME 90 tablet 0  . escitalopram (LEXAPRO) 20 MG tablet TAKE 1 TABLET BY MOUTH AT BEDTIME 90 tablet 1  . gabapentin (NEURONTIN) 300 MG capsule TAKE ONE CAPSULE BY MOUTH AT NIGHT 90 capsule 3  . levothyroxine  (SYNTHROID, LEVOTHROID) 150 MCG tablet TAKE 1 TABLET BY MOUTH ONCE DAILY MON- FRI AND 1/2 TABLET ON SAT & SUN 90 tablet 1  . levothyroxine (SYNTHROID, LEVOTHROID) 150 MCG tablet TAKE 1 TABLET BY MOUTH ONCE DAILY MONDAY-FRIDAY AND 1/2 TABLET ON SATURDAY AND SUNDAY. 90 tablet 0  . lidocaine-prilocaine (EMLA) cream Apply 1 application topically as needed. (Patient taking differently: Apply 1 application topically as needed (port access). ) 30 g 0  . meloxicam (MOBIC) 7.5 MG tablet TAKE 1 TABLET BY MOUTH ONCE DAILY 90 tablet 0  . omeprazole (PRILOSEC) 20 MG capsule Take 1 capsule (20 mg total) by mouth daily. 90 capsule 1  . rosuvastatin (CRESTOR) 20 MG tablet Take 1 tablet (20 mg total) by mouth daily. 90 tablet 3   No current facility-administered medications on file prior to visit.     ALLERGIES: Allergies  Allergen Reactions  . Ativan [Lorazepam] Other (See Comments)    Mental status change-"went crazy in the head"  . Fentanyl Other (See Comments)    hallucinations  . Tramadol Other (See Comments)    Altered mental status "crazy in the head'    FAMILY HISTORY: Family History  Problem Relation Age of Onset  . Alzheimer's disease Mother   . Hypertension Mother   . Heart disease Father        CAD and angioplasty in 58s  . Cancer Father        Bladder  . Other Brother        valvular heart disease  . Diabetes Neg Hx   . Stroke Neg Hx     SOCIAL HISTORY: Social History   Socioeconomic History  . Marital status: Married    Spouse name: Not on file  . Number of children: 2  . Years of education: Not on file  . Highest education level: Not on file  Occupational History  . Occupation: Retired  Scientific laboratory technician  . Financial resource strain: Not on file  . Food insecurity:    Worry: Not on file    Inability: Not on file  . Transportation needs:    Medical: Not on file    Non-medical: Not on file  Tobacco Use  . Smoking status: Former Smoker    Packs/day: 1.00    Years: 5.00     Pack years: 5.00    Types: Cigarettes    Last attempt to quit: 12/07/1958    Years since quitting: 59.6  . Smokeless tobacco: Never Used  . Tobacco comment: smoked 1956-1960, up 1 ppd  Substance and Sexual Activity  . Alcohol use: Yes    Alcohol/week: 8.4 oz    Types: 14 Glasses of wine per week  . Drug use: No  . Sexual activity: Not on file  Lifestyle  . Physical activity:    Days per week: Not on file    Minutes per session: Not on file  . Stress: Not on file  Relationships  . Social connections:    Talks on phone: Not on file  Gets together: Not on file    Attends religious service: Not on file    Active member of club or organization: Not on file    Attends meetings of clubs or organizations: Not on file    Relationship status: Not on file  . Intimate partner violence:    Fear of current or ex partner: Not on file    Emotionally abused: Not on file    Physically abused: Not on file    Forced sexual activity: Not on file  Other Topics Concern  . Not on file  Social History Narrative   Exercise:  Yard work, walks on occasion                REVIEW OF SYSTEMS: Constitutional: No fevers, chills, or sweats, no generalized fatigue, change in appetite Eyes: No visual changes, double vision, eye pain Ear, nose and throat: No hearing loss, ear pain, nasal congestion, sore throat Cardiovascular: No chest pain, palpitations Respiratory:  No shortness of breath at rest or with exertion, wheezes GastrointestinaI: No nausea, vomiting, diarrhea, abdominal pain, fecal incontinence Genitourinary:  No dysuria, urinary retention or frequency Musculoskeletal:  No neck pain, back pain Integumentary: No rash, pruritus, skin lesions Neurological: as above Psychiatric: No depression, insomnia, anxiety Endocrine: No palpitations, fatigue, diaphoresis, mood swings, change in appetite, change in weight, increased thirst Hematologic/Lymphatic:  No purpura,  petechiae. Allergic/Immunologic: no itchy/runny eyes, nasal congestion, recent allergic reactions, rashes  PHYSICAL EXAM: Vitals:   06/30/18 1054  BP: 128/78  Pulse: 64  SpO2: 94%   General: No acute distress.  Patient appears well-groomed.  normal body habitus. Head:  Normocephalic/atraumatic Eyes:  Fundi examined but not visualized Neck: supple, no paraspinal tenderness, full range of motion Heart:  Regular rate and rhythm Lungs:  Clear to auscultation bilaterally Back: No paraspinal tenderness Neurological Exam: alert and oriented to person, place, and time. Attention span and concentration intact, recent and remote memory intact, fund of knowledge intact.  Speech fluent and not dysarthric, language intact.   MMSE - Mini Mental State Exam 06/30/2018 11/03/2017 11/05/2016 04/22/2016  Not completed: - - - Refused  Orientation to time 3 0 4 -  Orientation to Place '4 2 4 ' -  Registration '3 3 3 ' -  Attention/ Calculation '5 2 4 ' -  Recall 0 0 0 -  Language- name 2 objects '2 2 2 ' -  Language- repeat '1 1 1 ' -  Language- follow 3 step command '3 3 3 ' -  Language- read & follow direction '1 1 1 ' -  Write a sentence '1 1 1 ' -  Copy design 0 0 1 -  Total score '23 15 24 ' -   CN II-XII intact. Bulk and tone normal, muscle strength 5/5 throughout.  Sensation to light touch  intact.  Deep tendon reflexes 2+ throughout.  Finger to nose testing intact.  Gait normal, Romberg negative.  IMPRESSION: Mild vascular dementia Hyperlipidemia  PLAN: 1.  Continue donepezil 31m at bedtime, aspirin 847mdaily and Crestor 2.  Recommend routine exercise, both cardiovascular and resistance/weight training 3.  Remain socially active and engage in mentally stimulating activities 4.  Limit driving to familiar areas 5.  Family should monitor and assist finances and medication management 6.  Mediterranean diet (see below) 7.  Follow up in one year  25 minutes spent face to face with patient, over 50% spent  discussing management.  AdMetta ClinesDO  CC:  Dr. BuQuay Burow

## 2018-06-30 NOTE — Patient Instructions (Signed)
1.  Continue donepezil 10mg  at bedtime, aspirin 81mg  daily and Crestor 2.  Recommend routine exercise, both cardiovascular and resistance/weight training 3.  Remain socially active and engage in mentally stimulating activities 4.  Limit driving to familiar areas 5.  Mediterranean diet (see below) 6.  Follow up in one year   Mediterranean Diet A Mediterranean diet refers to food and lifestyle choices that are based on the traditions of countries located on the [employer]. This way of eating has been shown to help prevent certain conditions and improve outcomes for people who have chronic diseases, like kidney disease and heart disease. What are tips for following this plan? Lifestyle  Cook and eat meals together with your family, when possible.  Drink enough fluid to keep your urine clear or pale yellow.  Be physically active every day. This includes: ? Aerobic exercise like running or swimming. ? Leisure activities like gardening, walking, or housework.  Get 7-8 hours of sleep each night.  If recommended by your health care provider, drink red wine in moderation. This means 1 glass a day for nonpregnant women and 2 glasses a day for men. A glass of wine equals 5 oz (150 mL). Reading food labels  Check the serving size of packaged foods. For foods such as rice and pasta, the serving size refers to the amount of cooked product, not dry.  Check the total fat in packaged foods. Avoid foods that have saturated fat or trans fats.  Check the ingredients list for added sugars, such as corn syrup. Shopping  At the grocery store, buy most of your food from the areas near the walls of the store. This includes: ? Fresh fruits and vegetables (produce). ? Grains, beans, nuts, and seeds. Some of these may be available in unpackaged forms or large amounts (in bulk). ? Fresh seafood. ? Poultry and eggs. ? Low-fat dairy products.  Buy whole ingredients instead of prepackaged  foods.  Buy fresh fruits and vegetables in-season from local farmers markets.  Buy frozen fruits and vegetables in resealable bags.  If you do not have access to quality fresh seafood, buy precooked frozen shrimp or canned fish, such as tuna, salmon, or sardines.  Buy small amounts of raw or cooked vegetables, salads, or olives from the deli or salad bar at your store.  Stock your pantry so you always have certain foods on hand, such as olive oil, canned tuna, canned tomatoes, rice, pasta, and beans. Cooking  Cook foods with extra-virgin olive oil instead of using butter or other vegetable oils.  Have meat as a side dish, and have vegetables or grains as your main dish. This means having meat in small portions or adding small amounts of meat to foods like pasta or stew.  Use beans or vegetables instead of meat in common dishes like chili or lasagna.  Experiment with different cooking methods. Try roasting or broiling vegetables instead of steaming or sauteing them.  Add frozen vegetables to soups, stews, pasta, or rice.  Add nuts or seeds for added healthy fat at each meal. You can add these to yogurt, salads, or vegetable dishes.  Marinate fish or vegetables using olive oil, lemon juice, garlic, and fresh herbs. Meal planning  Plan to eat 1 vegetarian meal one day each week. Try to work up to 2 vegetarian meals, if possible.  Eat seafood 2 or more times a week.  Have healthy snacks readily available, such as: ? Vegetable sticks with hummus. ? [place] yogurt. ?  Fruit and nut trail mix.  Eat balanced meals throughout the week. This includes: ? Fruit: 2-3 servings a day ? Vegetables: 4-5 servings a day ? Low-fat dairy: 2 servings a day ? Fish, poultry, or lean meat: 1 serving a day ? Beans and legumes: 2 or more servings a week ? Nuts and seeds: 1-2 servings a day ? Whole grains: 6-8 servings a day ? Extra-virgin olive oil: 3-4 servings a day  Limit red meat and sweets to  only a few servings a month What are my food choices?  Mediterranean diet ? Recommended ? Grains: Whole-grain pasta. Brown rice. Bulgar wheat. Polenta. Couscous. Whole-wheat bread. [place]. ? Vegetables: Artichokes. Beets. Broccoli. Cabbage. Carrots. Eggplant. Green beans. Chard. Kale. Spinach. Onions. Leeks. Peas. Squash. Tomatoes. Peppers. Radishes. ? Fruits: Apples. Apricots. Avocado. Berries. Bananas. Cherries. Dates. Figs. Grapes. Lemons. Melon. Oranges. Peaches. Plums. Pomegranate. ? Meats and other protein foods: Beans. Almonds. Sunflower seeds. Pine nuts. Peanuts. [place]. Salmon. Scallops. Shrimp. [place]. Tilapia. Clams. Oysters. Eggs. ? Dairy: Low-fat milk. Cheese. Greek yogurt. ? Beverages: Water. Red wine. Herbal tea. ? Fats and oils: Extra virgin olive oil. Avocado oil. Grape seed oil. ? Sweets and desserts: [place] yogurt with honey. Baked apples. Poached pears. Trail mix. ? Seasoning and other foods: Basil. Cilantro. Coriander. Cumin. Mint. Parsley. Sage. Rosemary. Tarragon. Garlic. Oregano. Thyme. Pepper. Balsalmic vinegar. Tahini. Hummus. Tomato sauce. Olives. Mushrooms. ? Limit these ? Grains: Prepackaged pasta or rice dishes. Prepackaged cereal with added sugar. ? Vegetables: Deep fried potatoes (french fries). ? Fruits: Fruit canned in syrup. ? Meats and other protein foods: Beef. Pork. Lamb. Poultry with skin. Hot dogs. [place] Salines. ? Dairy: Ice cream. Sour cream. Whole milk. ? Beverages: Juice. Sugar-sweetened soft drinks. Beer. Liquor and spirits. ? Fats and oils: Butter. Canola oil. Vegetable oil. Beef fat (tallow). Lard. ? Sweets and desserts: Cookies. Cakes. Pies. Candy. ? Seasoning and other foods: Mayonnaise. Premade sauces and marinades. ? The items listed may not be a complete list. Talk with your dietitian about what dietary choices are right for you. Summary  The Mediterranean diet includes both food and lifestyle choices.  Eat a variety of fresh fruits and  vegetables, beans, nuts, seeds, and whole grains.  Limit the amount of red meat and sweets that you eat.  Talk with your health care provider about whether it is safe for you to drink red wine in moderation. This means 1 glass a day for nonpregnant women and 2 glasses a day for men. A glass of wine equals 5 oz (150 mL). This information is not intended to replace advice given to you by your health care provider. Make sure you discuss any questions you have with your health care provider. Document Released: 07/16/2016 Document Revised: 08/18/2016 Document Reviewed: 07/16/2016 Elsevier Interactive Patient Education  [employer].    Vascular Dementia Dementia is a condition in which a person has problems with thinking, memory, and behavior that are severe enough to interfere with daily life. Vascular dementia is a type of dementia. It results from brain damage that is caused by the brain not getting enough blood. Vascular dementia usually begins between 40 and 62 years of age. What are the causes? Vascular dementia is caused by conditions that lessen blood flow to the brain. Common causes include:  Multiple small strokes. These may happen without symptoms (silent stroke).  Major stroke.  Damage to small blood vessels in the brain (cerebral small vessel disease).  What increases the risk?  Advancing  age.  Having had a stroke.  Having high blood pressure (hypertension) or high cholesterol.  Having a disease that affects the heart or blood vessels.  Smoking.  Having diabetes.  Being male.  Being obese.  Not being active.  Having depression. What are the signs or symptoms? Symptoms can vary a lot from one person to another. Symptoms may be mild or severe depending on the amount of damage and which parts of the brain have been affected. Symptoms may begin suddenly or may develop gradually. Symptoms may remain stable, or they may get worse over time. Symptoms of vascular  dementia may be similar to those of Alzheimer disease. The two conditions can occur together (mixed dementia). Symptoms of vascular dementia may include: Mental  Confusion.  Memory problems.  Poor attention and concentration.  Trouble understanding speech.  Depression.  Personality changes.  Trouble recognizing familiar people.  Agitation or aggression.  Paranoia.  Delusions or hallucinations. Physical  Weakness.  Poor balance.  Loss of bladder or bowel control (incontinence).  Unsteady walking (gait).  Speaking problems. Behavioral  Getting lost in familiar places.  Problems with planning and judgment.  Trouble following instructions.  Social problems.  Emotional outbursts.  Trouble with daily activities and self-care.  Problems handling money. How is this diagnosed? There is not a specific test to diagnose vascular dementia. The health care provider will consider the person's medical history and symptoms or changes that are reported by friends and family. The health care provider will do a physical exam and may order lab tests or other tests that check brain and nervous system function. Tests that may be done include:  Blood tests.  Brain imaging tests.  Tests of movement, speech, and other daily activities (neurological exam).  Tests of memory, thinking, and problem-solving (neuropsychological or neurocognitive testing).  Diagnosis may involve several specialists. These may include a health care provider who specializes in the brain and nervous system (neurologist), a provider who specializes in disorders of the mind (psychiatrist), and a provider who focuses on speech and language changes (Electrical engineer). How is this treated? There is no cure for vascular dementia. Brain damage that has already occurred cannot be reversed. Treatment depends on:  How severe the condition is.  Which parts of the brain have been affected.  The person's overall  health.  Treatment measures aim to:  Treat the underlying cause of vascular dementia and manage risk factors. This may include: ? Controlling blood pressure. ? Lowering cholesterol. ? Treating diabetes. ? Quitting smoking. ? Losing weight.  Manage symptoms.  Prevent further brain damage.  Improve the person's health and quality of life.  Treatment for dementia may involve a team of health care providers, including:  A neurologist.  A psychiatrist.  An occupational therapist.  A speech pathologist.  A cardiologist.  An exercise physiologist or physical therapist.  Follow these instructions at home: Home care for a person with vascular dementia depends on what caused the condition and how severe the symptoms are. General guidelines for care at home include:  Following the health care provider's instructions for treating the condition that caused the dementia.  Using medicines only as told by the person's health care provider.  Creating a safe living space.  Learning ways to help the person remember people, appointments, and daily activities.  Finding a support group to help caregivers and family to cope with the effects of dementia.  Helping family and friends learn about ways to communicate with a person who has  dementia.  Making sure the person keeps all follow-up visits and goes to all rehabilitation appointments as told by the health care team. This is important.  Contact a health care provider if:  A fever develops.  New behavioral problems develop.  Problems with swallowing develop.  Confusion gets worse.  Sleepiness gets worse. Get help right away if:  Loss of consciousness occurs.  There is a sudden loss of speech, balance, or thinking ability.  New numbness or paralysis occurs.  Sudden, severe headache occurs.  Vision is lost or suddenly gets worse in one or both eyes. This information is not intended to replace advice given to you by your  health care provider. Make sure you discuss any questions you have with your health care provider. Document Released: 11/13/2002 Document Revised: 04/30/2016 Document Reviewed: 03/06/2015 Elsevier Interactive Patient Education  2018 [employer].

## 2018-07-08 ENCOUNTER — Ambulatory Visit: Payer: PPO | Admitting: Neurology

## 2018-07-08 ENCOUNTER — Encounter

## 2018-07-11 ENCOUNTER — Other Ambulatory Visit: Payer: Self-pay | Admitting: Internal Medicine

## 2018-07-25 NOTE — Progress Notes (Signed)
[facility] [place], [place] [address] Phone: [phone] Subjective:      CC: Hip and neck pain follow-up  [id number]  Dean Pruitt is a 84 y.o. male coming in with complaint of back and hip pain follow-up.  History of malignant mesothelioma.  Patient has morbid greater trochanteric bursitis. Had been doing very well.  Last injection was 6 months ago.  Patient states that he has some soreness with walking. He has not been remembering to use pennsaid.  Overall though he is doing relatively well.  Nothing that is stopping him from activities     Past Medical History:  Diagnosis Date  . Cataract   . Depression    situational  . GERD (gastroesophageal reflux disease)   . Goals of care, counseling/discussion 03/20/2017  . Hyperlipidemia   . Hypothyroidism   . Reactive depression 03/20/2017  . Thyroid cancer ([place])    PMH of; on supressive therapy   Past Surgical History:  Procedure Laterality Date  . CATARACT EXTRACTION, BILATERAL  2013 & 2014   Dr Celene Squibb  . colon polyps  2002 & 2005   negative 2008; Dr Earlean Shawl  . COLONOSCOPY W/ POLYPECTOMY    . LUNG BIOPSY Left 02/15/2017   Procedure: LUNG BIOPSY;  Surgeon: Melrose Nakayama, MD;  Location: [facility];  Service: Thoracic;  Laterality: Left;  . PLEURAL EFFUSION DRAINAGE Left 02/15/2017   Procedure: DRAINAGE OF PLEURAL EFFUSION;  Surgeon: Melrose Nakayama, MD;  Location: [facility];  Service: Thoracic;  Laterality: Left;  . PORTACATH PLACEMENT N/A 03/25/2017   Procedure: INSERTION PORT-A-CATH, right internal jugular;  Surgeon: Melrose Nakayama, MD;  Location: [facility];  Service: Thoracic;  Laterality: N/A;  . THYROIDECTOMY  2001   S/P RAI  . VIDEO ASSISTED THORACOSCOPY Left 02/15/2017   Procedure: VIDEO ASSISTED THORACOSCOPY;  Surgeon: Melrose Nakayama, MD;  Location: [facility];  Service: Thoracic;  Laterality: Left;   Social History   Socioeconomic History  . Marital status:  Married    Spouse name: Not on file  . Number of children: 2  . Years of education: Not on file  . Highest education level: Not on file  Occupational History  . Occupation: Retired  Scientific laboratory technician  . Financial resource strain: Not on file  . Food insecurity:    Worry: Not on file    Inability: Not on file  . Transportation needs:    Medical: Not on file    Non-medical: Not on file  Tobacco Use  . Smoking status: Former Smoker    Packs/day: 1.00    Years: 5.00    Pack years: 5.00    Types: Cigarettes    Last attempt to quit: 12/07/1958    Years since quitting: 59.6  . Smokeless tobacco: Never Used  . Tobacco comment: smoked 1956-1960, up 1 ppd  Substance and Sexual Activity  . Alcohol use: Yes    Alcohol/week: 14.0 standard drinks    Types: 14 Glasses of wine per week  . Drug use: No  . Sexual activity: Not on file  Lifestyle  . Physical activity:    Days per week: Not on file    Minutes per session: Not on file  . Stress: Not on file  Relationships  . Social connections:    Talks on phone: Not on file    Gets together: Not on file    Attends religious service: Not on file    Active member  of club or organization: Not on file    Attends meetings of clubs or organizations: Not on file    Relationship status: Not on file  Other Topics Concern  . Not on file  Social History Narrative   Exercise:  Yard work, walks on occasion               Allergies  Allergen Reactions  . Ativan [Lorazepam] Other (See Comments)    Mental status change-"went crazy in the head"  . Fentanyl Other (See Comments)    hallucinations  . Tramadol Other (See Comments)    Altered mental status "crazy in the head'   Family History  Problem Relation Age of Onset  . Alzheimer's disease Mother   . Hypertension Mother   . Heart disease Father        CAD and angioplasty in 48s  . Cancer Father        Bladder  . Other Brother        valvular heart disease  . Diabetes Neg Hx   . Stroke  Neg Hx      Past medical history, social, surgical and family history all reviewed in electronic medical record.  No pertanent information unless stated regarding to the chief complaint.   Review of Systems:Review of systems updated and as accurate as of 07/27/18  No headache, visual changes, nausea, vomiting, diarrhea, constipation, dizziness, abdominal pain, skin rash, fevers, chills, night sweats, weight loss, swollen lymph nodes, body aches, joint swelling, chest pain, shortness of breath, mood changes.  Positive muscle aches  Objective  Blood pressure 118/82, pulse 66, height 5\' 10"  (1.778 m), weight 195 lb (88.5 kg), SpO2 93 %. Systems examined below as of 07/27/18   General: No apparent distress alert and oriented x3 mood and affect normal, dressed appropriately.  HEENT: Pupils equal, extraocular movements intact  Respiratory: Patient's speak in full sentences and does not appear short of breath  Cardiovascular: No lower extremity edema, non tender, no erythema  Skin: Warm dry intact with no signs of infection or rash on extremities or on axial skeleton.  Abdomen: Soft nontender  Neuro: Cranial nerves II through XII are intact, neurovascularly intact in all extremities with 2+ DTRs and 2+ pulses.  Lymph: No lymphadenopathy of posterior or anterior cervical chain or axillae bilaterally.  Gait mild antalgic MSK:  tender with full range of motion and good stability and symmetric strength and tone of shoulders, elbows, wrist,  knee and ankles bilaterally.  Left hip exam shows some mild discomfort and tenderness over the greater trochanteric area and mild positive Corky Sox.  Negative straight leg test.  Mild discomfort over the right sacroiliac joint.  Otherwise fairly unremarkable    Impression and Recommendations:     This case required medical decision making of moderate complexity.      Note: This dictation was prepared with Dragon dictation along with smaller phrase technology.  Any transcriptional errors that result from this process are unintentional.

## 2018-07-27 ENCOUNTER — Ambulatory Visit (INDEPENDENT_AMBULATORY_CARE_PROVIDER_SITE_OTHER): Payer: PPO | Admitting: Family Medicine

## 2018-07-27 ENCOUNTER — Encounter: Payer: Self-pay | Admitting: Family Medicine

## 2018-07-27 DIAGNOSIS — M7061 Trochanteric bursitis, right hip: Secondary | ICD-10-CM

## 2018-07-27 NOTE — Assessment & Plan Note (Signed)
Stable at the moment.  No significant changes.  If any worsening symptoms consider injections again.  Follow-up as needed

## 2018-07-27 NOTE — Patient Instructions (Signed)
You are great  Ice is your friend.  pennsaid pinkie amount topically 2 times daily as needed.  Stay active Call us at [phone] when you need [place] for an injection

## 2018-07-29 DIAGNOSIS — H18831 Recurrent erosion of cornea, right eye: Secondary | ICD-10-CM | POA: Diagnosis not present

## 2018-08-01 DIAGNOSIS — S0501XA Injury of conjunctiva and corneal abrasion without foreign body, right eye, initial encounter: Secondary | ICD-10-CM | POA: Diagnosis not present

## 2018-08-02 ENCOUNTER — Other Ambulatory Visit: Payer: Self-pay | Admitting: Family Medicine

## 2018-08-02 DIAGNOSIS — S0501XD Injury of conjunctiva and corneal abrasion without foreign body, right eye, subsequent encounter: Secondary | ICD-10-CM | POA: Diagnosis not present

## 2018-08-02 NOTE — Telephone Encounter (Signed)
Refill done.  

## 2018-08-04 NOTE — Progress Notes (Signed)
Subjective:    Patient ID: Dean Pruitt, male    DOB: Jan 12, 1934, 84 y.o.   MRN: [id number]  HPI The patient is here for an acute visit for Bowels loose and urinary incontinence.  He is here with his daughter   Loose bowels: it has been going on for several months. After he eats he is often running to the bathroom.  The stools are soft to watery.  The stools are less watery than they were in the past.  No blood in the stool or abdominal pain/cramping.  He saw Dr. Earlean Shawl on 03/28/2018 for chronic diarrhea.  At that time he admitted to drinking 1 bottle of red wine at night.  Apparently per Dr. Liliane Channel note when he had stopped the alcohol his bowels were normal and he was having 1-2 day of formed stool.  It was advised that he stop drinking.  He was also told to stop the probiotics.  He states he drinks a couple of glasses of wine a day only.  He does not eat that healthy and his daughter states he does not get much fiber in his diet.  Urinary incontinence:  It has been going on for a while.  It has gotten worse  He feels he is empyting his bladder.  He drinks a lot of coffee during the day, but it is half caffeine, half decaf.  He does have a couple of alcoholic drinks at night.  He denies any dysuria, hematuria, difficulty urinating.  He does go frequently because he drinks a lot during the day.   The right ankle is more swollen than the left.  This is been this way for time.  He denies any injury.  It never goes away.  It does not get better/worse as the day progresses.  He does not notice it.  He denies SOB, palps and chest pain.    Medications and allergies reviewed with patient and updated if appropriate.  Patient Active Problem List   Diagnosis Date Noted  . Greater trochanteric bursitis, right 01/27/2018  . Pain of left eye 01/07/2018  . Otalgia, right 12/21/2017  . Bursitis of hip 11/17/2017  . Bilateral low back pain without sciatica 11/17/2017  . Thoracic aortic aneurysm  without rupture ([place]) 11/09/2017  . Muscular chest pain 11/09/2017  . Recurrent falls 11/09/2017  . Hypothyroidism 10/13/2017  . B12 deficiency 10/13/2017  . Change in stool 10/13/2017  . Port catheter in place 06/02/2017  . Degenerative arthritis of left knee 03/24/2017  . Goals of care, counseling/discussion 03/20/2017  . Encounter for antineoplastic chemotherapy 03/20/2017  . Reactive depression 03/20/2017  . Malignant mesothelioma of pleura ([place]) 02/19/2017  . History of lung biopsy 02/17/2017  . Pleural effusion 02/15/2017  . Greater trochanteric bursitis of left hip 12/31/2016  . Pleural effusion, left 10/27/2016  . infrarenal abdominal aortic aneurysm without rupture ([place]) - [place] needed 2020 10/22/2016  . Left lumbar radiculopathy 10/12/2016  . Bursitis of left hip 09/30/2016  . Left foot drop 09/30/2016  . Toe pain, left 08/13/2016  . Myalgia 07/04/2016  . Mild cognitive impairment 07/03/2016  . Adjustment disorder with depressed mood 07/03/2016  . Frequency of urination 09/26/2014  . Macular degeneration 06/22/2011  . Fasting hyperglycemia 05/20/2009  . NONSPECIFIC ABNORMAL ELECTROCARDIOGRAM 05/20/2009  . THYROID CANCER, HX OF 05/20/2009  . History of colonic polyps 05/20/2009  . Hyperlipidemia 11/21/2007    Current Outpatient Medications on File Prior to Visit  Medication Sig Dispense Refill  .  aspirin 81 MG tablet Take 81 mg by mouth daily.    Marland Kitchen donepezil (ARICEPT) 10 MG tablet TAKE 1 TABLET BY MOUTH AT BEDTIME 90 tablet 0  . escitalopram (LEXAPRO) 20 MG tablet TAKE 1 TABLET BY MOUTH AT BEDTIME 90 tablet 1  . gabapentin (NEURONTIN) 300 MG capsule TAKE ONE CAPSULE BY MOUTH AT NIGHT 90 capsule 3  . levothyroxine (SYNTHROID, LEVOTHROID) 150 MCG tablet TAKE 1 TABLET BY MOUTH ONCE DAILY MONDAY-FRIDAY AND 1/2 TABLET ON SATURDAY AND SUNDAY. 90 tablet 0  . lidocaine-prilocaine (EMLA) cream Apply 1 application topically as needed. (Patient taking differently: Apply 1  application topically as needed (port access). ) 30 g 0  . meloxicam (MOBIC) 7.5 MG tablet TAKE 1 TABLET BY MOUTH ONCE DAILY 90 tablet 0  . omeprazole (PRILOSEC) 20 MG capsule Take 1 capsule (20 mg total) by mouth daily. -- Office visit needed for further refills 90 capsule 0  . rosuvastatin (CRESTOR) 20 MG tablet Take 1 tablet (20 mg total) by mouth daily. 90 tablet 3   No current facility-administered medications on file prior to visit.     Past Medical History:  Diagnosis Date  . Cataract   . Depression    situational  . GERD (gastroesophageal reflux disease)   . Goals of care, counseling/discussion 03/20/2017  . Hyperlipidemia   . Hypothyroidism   . Reactive depression 03/20/2017  . Thyroid cancer ([place])    PMH of; on supressive therapy    Past Surgical History:  Procedure Laterality Date  . CATARACT EXTRACTION, BILATERAL  2013 & 2014   Dr Celene Squibb  . colon polyps  2002 & 2005   negative 2008; Dr Earlean Shawl  . COLONOSCOPY W/ POLYPECTOMY    . LUNG BIOPSY Left 02/15/2017   Procedure: LUNG BIOPSY;  Surgeon: Melrose Nakayama, MD;  Location: [facility];  Service: Thoracic;  Laterality: Left;  . PLEURAL EFFUSION DRAINAGE Left 02/15/2017   Procedure: DRAINAGE OF PLEURAL EFFUSION;  Surgeon: Melrose Nakayama, MD;  Location: [facility];  Service: Thoracic;  Laterality: Left;  . PORTACATH PLACEMENT N/A 03/25/2017   Procedure: INSERTION PORT-A-CATH, right internal jugular;  Surgeon: Melrose Nakayama, MD;  Location: [facility];  Service: Thoracic;  Laterality: N/A;  . THYROIDECTOMY  2001   S/P RAI  . VIDEO ASSISTED THORACOSCOPY Left 02/15/2017   Procedure: VIDEO ASSISTED THORACOSCOPY;  Surgeon: Melrose Nakayama, MD;  Location: [facility];  Service: Thoracic;  Laterality: Left;    Social History   Socioeconomic History  . Marital status: Married    Spouse name: Not on file  . Number of children: 2  . Years of education: Not on file  . Highest education level: Not on file  Occupational History    . Occupation: Retired  Scientific laboratory technician  . Financial resource strain: Not on file  . Food insecurity:    Worry: Not on file    Inability: Not on file  . Transportation needs:    Medical: Not on file    Non-medical: Not on file  Tobacco Use  . Smoking status: Former Smoker    Packs/day: 1.00    Years: 5.00    Pack years: 5.00    Types: Cigarettes    Last attempt to quit: 12/07/1958    Years since quitting: 59.7  . Smokeless tobacco: Never Used  . Tobacco comment: smoked 1956-1960, up 1 ppd  Substance and Sexual Activity  . Alcohol use: Yes    Alcohol/week: 14.0 standard drinks    Types:  14 Glasses of wine per week  . Drug use: No  . Sexual activity: Not on file  Lifestyle  . Physical activity:    Days per week: Not on file    Minutes per session: Not on file  . Stress: Not on file  Relationships  . Social connections:    Talks on phone: Not on file    Gets together: Not on file    Attends religious service: Not on file    Active member of club or organization: Not on file    Attends meetings of clubs or organizations: Not on file    Relationship status: Not on file  Other Topics Concern  . Not on file  Social History Narrative   Exercise:  Yard work, walks on occasion                Family History  Problem Relation Age of Onset  . Alzheimer's disease Mother   . Hypertension Mother   . Heart disease Father        CAD and angioplasty in 69s  . Cancer Father        Bladder  . Other Brother        valvular heart disease  . Diabetes Neg Hx   . Stroke Neg Hx     Review of Systems  Constitutional: Negative for fever.  Respiratory: Negative for shortness of breath.   Cardiovascular: Positive for leg swelling (right ankle only). Negative for chest pain and palpitations.  Genitourinary: Positive for frequency. Negative for difficulty urinating, dysuria and hematuria.       Incontinence       Objective:   Vitals:   08/05/18 1301  BP: 108/60  Pulse: 67   Resp: 14  Temp: 98.3 F (36.8 C)  SpO2: 94%   BP Readings from Last 3 Encounters:  08/05/18 108/60  07/27/18 118/82  06/30/18 128/78   Wt Readings from Last 3 Encounters:  08/05/18 201 lb 9.6 oz (91.4 kg)  07/27/18 195 lb (88.5 kg)  06/30/18 199 lb (90.3 kg)   Body mass index is 28.93 kg/m.   Physical Exam  Constitutional: He appears well-developed and well-nourished. No distress.  HENT:  Head: Normocephalic and atraumatic.  Neck: Neck supple. No tracheal deviation present. No thyromegaly present.  Cardiovascular: Normal rate and regular rhythm.  Pulmonary/Chest: Effort normal and breath sounds normal. No respiratory distress. He has no wheezes. He has no rales.  Abdominal: Soft. He exhibits no distension. There is no tenderness.  Musculoskeletal: He exhibits edema (Mild right ankle, nonpitting; no left lower extremity edema).  Lymphadenopathy:    He has no cervical adenopathy.  Skin: Skin is warm and dry. He is not diaphoretic.           Assessment & Plan:    See Problem List for Assessment and Plan of chronic medical problems.

## 2018-08-05 ENCOUNTER — Ambulatory Visit (INDEPENDENT_AMBULATORY_CARE_PROVIDER_SITE_OTHER): Payer: PPO | Admitting: Internal Medicine

## 2018-08-05 ENCOUNTER — Encounter: Payer: Self-pay | Admitting: Internal Medicine

## 2018-08-05 VITALS — BP 108/60 | HR 67 | Temp 98.3°F | Resp 14 | Ht 70.0 in | Wt 201.6 lb

## 2018-08-05 DIAGNOSIS — R195 Other fecal abnormalities: Secondary | ICD-10-CM | POA: Diagnosis not present

## 2018-08-05 DIAGNOSIS — S0501XD Injury of conjunctiva and corneal abrasion without foreign body, right eye, subsequent encounter: Secondary | ICD-10-CM | POA: Diagnosis not present

## 2018-08-05 DIAGNOSIS — R32 Unspecified urinary incontinence: Secondary | ICD-10-CM | POA: Diagnosis not present

## 2018-08-05 DIAGNOSIS — Z23 Encounter for immunization: Secondary | ICD-10-CM | POA: Diagnosis not present

## 2018-08-05 DIAGNOSIS — M25471 Effusion, right ankle: Secondary | ICD-10-CM | POA: Insufficient documentation

## 2018-08-05 NOTE — Assessment & Plan Note (Signed)
He is experiencing urinary incontinence No other concerning symptoms Will refer to urology for further evaluation and treatment

## 2018-08-05 NOTE — Patient Instructions (Addendum)
Take the benefiber pills daily. This will likely improve your stools.  If your stools are too loose you can try imodium as needed.    A referral was ordered for urology.  Try decreasing your alcohol and coffee intake.

## 2018-08-05 NOTE — Assessment & Plan Note (Signed)
He has been experiencing loose stools-watery stools from months He has seen Dr. Earlean Shawl for this in the past and it seems like it may have been related to alcohol or probiotics at that time Stools are loose, but less watery than they had been in the past Advised to limit alcohol He does not have a healthy diet and it is currently low in fiber Start Benefiber daily Advised for watery stool he can take Imodium as needed

## 2018-08-05 NOTE — Assessment & Plan Note (Signed)
For a long time-unable to determine how long he has had mild right ankle swelling that is nonpitting in nature.  It does not get better or worse throughout the day or night.  It does not bother him.  He does not have any left lower extremity edema and he denies any other concerning symptoms Discussed probable causes of vein damage in that leg for some reason No leg pain Given duration and lack of other symptoms unlikely to be a blood clot and he agrees No evaluation needed at this time

## 2018-08-12 ENCOUNTER — Telehealth: Payer: Self-pay | Admitting: Internal Medicine

## 2018-08-12 MED ORDER — LEVOTHYROXINE SODIUM 150 MCG PO TABS: ORAL_TABLET | ORAL | 0 refills | Status: DC

## 2018-08-12 NOTE — Telephone Encounter (Signed)
Copied from [place] [phone]. Topic: Quick Communication - Rx Refill/Question >> Aug 12, 2018  1:58 PM Margot Ables wrote: Medication: levothyroxine - pt daughter states that she was told a while ago that pt was to take 1/day (had been taking 1/2 on Saturday and Sunday) - Please advise as pt is almost out. Send in refill please./  Has the patient contacted their pharmacy? Yes - told to contact [facility] (with phone number or street name): [facility] on [address]   Pt was also supposed to be referred to Urology but they have not been contacted. I do not see the name of the office referred to in the referral.

## 2018-08-12 NOTE — Telephone Encounter (Signed)
Spoke with daughter and advised refill was sent and referrals can take a couple of weeks to hear back. Daughter understood.

## 2018-08-30 ENCOUNTER — Other Ambulatory Visit: Payer: Self-pay | Admitting: Neurology

## 2018-09-19 DIAGNOSIS — N3946 Mixed incontinence: Secondary | ICD-10-CM | POA: Diagnosis not present

## 2018-10-24 ENCOUNTER — Other Ambulatory Visit: Payer: Self-pay | Admitting: Internal Medicine

## 2018-11-07 ENCOUNTER — Ambulatory Visit (HOSPITAL_COMMUNITY)
Admission: RE | Admit: 2018-11-07 | Discharge: 2018-11-07 | Disposition: A | Discharge status: Home / Self Care | Payer: PPO | Source: Ambulatory Visit | Attending: Internal Medicine | Admitting: Internal Medicine

## 2018-11-07 ENCOUNTER — Other Ambulatory Visit: Payer: PPO

## 2018-11-07 ENCOUNTER — Inpatient Hospital Stay: Payer: PPO | Attending: Internal Medicine

## 2018-11-07 DIAGNOSIS — C450 Mesothelioma of pleura: Secondary | ICD-10-CM | POA: Insufficient documentation

## 2018-11-07 DIAGNOSIS — C3490 Malignant neoplasm of unspecified part of unspecified bronchus or lung: Secondary | ICD-10-CM | POA: Insufficient documentation

## 2018-11-07 DIAGNOSIS — Z7982 Long term (current) use of aspirin: Secondary | ICD-10-CM | POA: Insufficient documentation

## 2018-11-07 DIAGNOSIS — Z79899 Other long term (current) drug therapy: Secondary | ICD-10-CM | POA: Diagnosis not present

## 2018-11-07 DIAGNOSIS — Z9221 Personal history of antineoplastic chemotherapy: Secondary | ICD-10-CM | POA: Diagnosis not present

## 2018-11-07 LAB — CMP (CANCER CENTER ONLY)
ALBUMIN: 3.8 g/dL (ref 3.5–5.0)
ALK PHOS: 62 U/L (ref 38–126)
ALT: 38 U/L (ref 0–44)
AST: 52 U/L — ABNORMAL HIGH (ref 15–41)
Anion gap: 10 (ref 5–15)
BILIRUBIN TOTAL: 1.3 mg/dL — AB (ref 0.3–1.2)
BUN: 18 mg/dL (ref 8–23)
CALCIUM: 9.4 mg/dL (ref 8.9–10.3)
CO2: 27 mmol/L (ref 22–32)
Chloride: 106 mmol/L (ref 98–111)
Creatinine: 1.1 mg/dL (ref 0.61–1.24)
GLUCOSE: 107 mg/dL — AB (ref 70–99)
Potassium: 4.2 mmol/L (ref 3.5–5.1)
Sodium: 143 mmol/L (ref 135–145)
TOTAL PROTEIN: 6.2 g/dL — AB (ref 6.5–8.1)

## 2018-11-07 LAB — CBC WITH DIFFERENTIAL (CANCER CENTER ONLY)
Abs Immature Granulocytes: 0.01 10*3/uL (ref 0.00–0.07)
BASOS PCT: 1 %
Basophils Absolute: 0.1 10*3/uL (ref 0.0–0.1)
EOS ABS: 0.1 10*3/uL (ref 0.0–0.5)
Eosinophils Relative: 2 %
HCT: 46.5 % (ref 39.0–52.0)
Hemoglobin: 15.7 g/dL (ref 13.0–17.0)
IMMATURE GRANULOCYTES: 0 %
Lymphocytes Relative: 27 %
Lymphs Abs: 1.4 10*3/uL (ref 0.7–4.0)
MCH: 33.7 pg (ref 26.0–34.0)
MCHC: 33.8 g/dL (ref 30.0–36.0)
MCV: 99.8 fL (ref 80.0–100.0)
MONO ABS: 0.6 10*3/uL (ref 0.1–1.0)
MONOS PCT: 12 %
NEUTROS PCT: 58 %
Neutro Abs: 3 10*3/uL (ref 1.7–7.7)
PLATELETS: 148 10*3/uL — AB (ref 150–400)
RBC: 4.66 MIL/uL (ref 4.22–5.81)
RDW: 12.3 % (ref 11.5–15.5)
WBC Count: 5.2 10*3/uL (ref 4.0–10.5)
nRBC: 0 % (ref 0.0–0.2)

## 2018-11-07 MED ORDER — HEPARIN SOD (PORK) LOCK FLUSH 100 UNIT/ML IV SOLN
INTRAVENOUS | Status: AC
  Filled 2018-11-07: qty 5

## 2018-11-07 MED ORDER — SODIUM CHLORIDE (PF) 0.9 % IJ SOLN
INTRAMUSCULAR | Status: AC
  Filled 2018-11-07: qty 50

## 2018-11-07 MED ORDER — HEPARIN SOD (PORK) LOCK FLUSH 100 UNIT/ML IV SOLN
500.0000 [IU] | Freq: Once | INTRAVENOUS | Status: AC
  Administered 2018-11-07: 500 [IU] via INTRAVENOUS

## 2018-11-07 MED ORDER — IOHEXOL 300 MG/ML  SOLN
75.0000 mL | Freq: Once | INTRAMUSCULAR | Status: AC | PRN
  Administered 2018-11-07: 75 mL via INTRAVENOUS

## 2018-11-09 ENCOUNTER — Encounter: Payer: Self-pay | Admitting: Internal Medicine

## 2018-11-09 ENCOUNTER — Inpatient Hospital Stay: Payer: PPO | Admitting: Internal Medicine

## 2018-11-09 ENCOUNTER — Telehealth: Payer: Self-pay | Admitting: Internal Medicine

## 2018-11-09 ENCOUNTER — Other Ambulatory Visit: Payer: Self-pay

## 2018-11-09 VITALS — BP 116/84 | HR 64 | Temp 98.2°F | Resp 18 | Ht 70.0 in | Wt 204.0 lb

## 2018-11-09 DIAGNOSIS — Z79899 Other long term (current) drug therapy: Secondary | ICD-10-CM

## 2018-11-09 DIAGNOSIS — Z9221 Personal history of antineoplastic chemotherapy: Secondary | ICD-10-CM

## 2018-11-09 DIAGNOSIS — C450 Mesothelioma of pleura: Secondary | ICD-10-CM | POA: Diagnosis not present

## 2018-11-09 DIAGNOSIS — Z7982 Long term (current) use of aspirin: Secondary | ICD-10-CM | POA: Diagnosis not present

## 2018-11-09 NOTE — Telephone Encounter (Signed)
Printed calendar and avs. °

## 2018-11-09 NOTE — Progress Notes (Signed)
[place] [phone]   [phone]  OFFICE PROGRESS NOTE  [name], MD [place] [address]  DIAGNOSIS: Stage II malignant pleural mesothelioma, epithelioid type involving the left hemithorax diagnosed in March 2018.  PRIOR THERAPY:  1) Status post left VATS with biopsy and wedge resection of the left lower lobe as well as parietal pleurectomy under the care of Dr. Roxan Hockey on 02/15/2017. 2) Palliative systemic chemotherapy with carboplatin for AUC of 5, Alimta 500 MG/M2 and Avastin 15 MG/KG every 3 weeks. Status post 6 cycles.  CURRENT THERAPY: Observation.  INTERVAL HISTORY: Dean Pruitt 84 y.o. male returns to the clinic today for six-month follow-up visit accompanied by his daughter.  The patient is feeling fine today with no concerning complaints.  He denied having any fatigue or weakness.  He denied having any chest pain, shortness of breath, cough or hemoptysis.  He has no nausea, vomiting, diarrhea or constipation.  He has no concerning weight loss or night sweats.  He had repeat CT scan of the chest performed recently and is here for evaluation and discussion of his discuss results.   MEDICAL HISTORY: Past Medical History:  Diagnosis Date  . Cataract   . Depression    situational  . GERD (gastroesophageal reflux disease)   . Goals of care, counseling/discussion 03/20/2017  . Hyperlipidemia   . Hypothyroidism   . Reactive depression 03/20/2017  . Thyroid cancer ([place])    PMH of; on supressive therapy    ALLERGIES:  is allergic to ativan [lorazepam]; fentanyl; and tramadol.  MEDICATIONS:  Current Outpatient Medications  Medication Sig Dispense Refill  . aspirin 81 MG tablet Take 81 mg by mouth daily.    . bifidobacterium infantis (ALIGN) capsule Take by mouth.    . donepezil (ARICEPT) 10 MG tablet TAKE 1 TABLET BY MOUTH AT BEDTIME 90 tablet 3  . escitalopram (LEXAPRO) 20 MG tablet TAKE 1 TABLET BY  MOUTH ONCE DAILY AT BEDTIME 90 tablet 3  . gabapentin (NEURONTIN) 300 MG capsule TAKE ONE CAPSULE BY MOUTH AT NIGHT 90 capsule 3  . levothyroxine (SYNTHROID, LEVOTHROID) 150 MCG tablet TAKE 1 TABLET BY MOUTH ONCE DAILY 90 tablet 0  . lidocaine-prilocaine (EMLA) cream Apply 1 application topically as needed. (Patient taking differently: Apply 1 application topically as needed (port access). ) 30 g 0  . meloxicam (MOBIC) 7.5 MG tablet TAKE 1 TABLET BY MOUTH ONCE DAILY 90 tablet 0  . omeprazole (PRILOSEC) 20 MG capsule TAKE 1 CAPSULE BY MOUTH ONCE DAILY 90 capsule 0  . rosuvastatin (CRESTOR) 20 MG tablet Take 1 tablet (20 mg total) by mouth daily. 90 tablet 3  . sodium chloride (MURO 128) 5 % ophthalmic solution INSTILL 1 DROP INTO RIGHT EYE 4 TIMES DAILY  99   No current facility-administered medications for this visit.     SURGICAL HISTORY:  Past Surgical History:  Procedure Laterality Date  . CATARACT EXTRACTION, BILATERAL  2013 & 2014   Dr Celene Squibb  . colon polyps  2002 & 2005   negative 2008; Dr Earlean Shawl  . COLONOSCOPY W/ POLYPECTOMY    . LUNG BIOPSY Left 02/15/2017   Procedure: LUNG BIOPSY;  Surgeon: Melrose Nakayama, MD;  Location: [facility];  Service: Thoracic;  Laterality: Left;  . PLEURAL EFFUSION DRAINAGE Left 02/15/2017   Procedure: DRAINAGE OF PLEURAL EFFUSION;  Surgeon: Melrose Nakayama, MD;  Location: [facility];  Service: Thoracic;  Laterality: Left;  .  PORTACATH PLACEMENT N/A 03/25/2017   Procedure: INSERTION PORT-A-CATH, right internal jugular;  Surgeon: Melrose Nakayama, MD;  Location: [facility];  Service: Thoracic;  Laterality: N/A;  . THYROIDECTOMY  2001   S/P RAI  . VIDEO ASSISTED THORACOSCOPY Left 02/15/2017   Procedure: VIDEO ASSISTED THORACOSCOPY;  Surgeon: Melrose Nakayama, MD;  Location: [facility];  Service: Thoracic;  Laterality: Left;    REVIEW OF SYSTEMS:  A comprehensive review of systems was negative.   PHYSICAL EXAMINATION: General appearance: alert, cooperative  and no distress Head: Normocephalic, without obvious abnormality, atraumatic Neck: no adenopathy, no JVD, supple, symmetrical, trachea midline and thyroid not enlarged, symmetric, no tenderness/mass/nodules Lymph nodes: Cervical, supraclavicular, and axillary nodes normal. Resp: clear to auscultation bilaterally Back: symmetric, no curvature. ROM normal. No CVA tenderness. Cardio: regular rate and rhythm, S1, S2 normal, no murmur, click, rub or gallop GI: soft, non-tender; bowel sounds normal; no masses,  no organomegaly Extremities: extremities normal, atraumatic, no cyanosis or edema  ECOG PERFORMANCE STATUS: 1 - Symptomatic but completely ambulatory  Blood pressure 116/84, pulse 64, temperature 98.2 F (36.8 C), temperature source Oral, resp. rate 18, height 5\' 10"  (1.778 m), weight 204 lb (92.5 kg), SpO2 95 %.  LABORATORY DATA: Lab Results  Component Value Date   WBC 5.2 11/07/2018   HGB 15.7 11/07/2018   HCT 46.5 11/07/2018   MCV 99.8 11/07/2018   PLT 148 (L) 11/07/2018      Chemistry      Component Value Date/Time   NA 143 11/07/2018 1416   NA 140 11/04/2017 1210   K 4.2 11/07/2018 1416   K 4.1 11/04/2017 1210   CL 106 11/07/2018 1416   CO2 27 11/07/2018 1416   CO2 26 11/04/2017 1210   BUN 18 11/07/2018 1416   BUN 20.1 11/04/2017 1210   CREATININE 1.10 11/07/2018 1416   CREATININE 1.2 11/04/2017 1210      Component Value Date/Time   CALCIUM 9.4 11/07/2018 1416   CALCIUM 9.6 11/04/2017 1210   ALKPHOS 62 11/07/2018 1416   ALKPHOS 62 11/04/2017 1210   AST 52 (H) 11/07/2018 1416   AST 37 (H) 11/04/2017 1210   ALT 38 11/07/2018 1416   ALT 22 11/04/2017 1210   BILITOT 1.3 (H) 11/07/2018 1416   BILITOT 1.32 (H) 11/04/2017 1210       RADIOGRAPHIC STUDIES: Ct Chest W Contrast  Result Date: 11/07/2018 CLINICAL DATA:  Non-small cell lung cancer. Restaging. Chemotherapy complete 2018. LEFT lower lung pain EXAM: CT CHEST WITH CONTRAST TECHNIQUE: Multidetector CT  imaging of the chest was performed during intravenous contrast administration. CONTRAST:  1mL OMNIPAQUE IOHEXOL 300 MG/ML  SOLN COMPARISON:  CT 05/06/2018 FINDINGS: Cardiovascular: Coronary artery calcification and aortic atherosclerotic calcification. Port in the anterior chest wall with tip in distal SVC. Mediastinum/Nodes: No axillary supraclavicular adenopathy. No mediastinal hilar adenopathy. No pericardial effusion. Esophagus normal. Lungs/Pleura: Volume loss in the LEFT hemithorax. Mild pleural-parenchymal thickening at the LEFT lung base. No suspicious nodularity. Fine reticular pattern in the peripheral aspect LEFT lower lobe is also unchanged. RIGHT lung clear. Upper Abdomen: Limited view of the liver, kidneys, pancreas are unremarkable. Normal adrenal glands. Musculoskeletal: No aggressive osseous lesion. IMPRESSION: 1. No evidence of lung cancer recurrence on chest CT. 2. Postsurgical change in the LEFT hemithorax 3. Coronary artery calcification and Aortic Atherosclerosis (ICD10-I70.0). Electronically Signed   By: Suzy Bouchard M.D.   On: 11/07/2018 16:59    ASSESSMENT AND PLAN:  This is a very pleasant 84 years  old white male with malignant pleural mesothelioma involving the left hemothorax. He completed treatment with systemic chemotherapy with carboplatin, Alimta and Avastin status post 6 cycles. He has been tolerating the treatment fairly well with no significant adverse effects except for fatigue. He has been in observation since that time. Repeat CT scan of the chest performed recently showed no concerning findings for disease recurrence or progression. I discussed the scan results with the patient and his daughter and recommended for him to continue in observation with repeat CT scan of the chest in 6 months. The patient was advised to call immediately if he has any concerning symptoms in the interval. The patient voices understanding of current disease status and treatment options  and is in agreement with the current care plan. All questions were answered. The patient knows to call the clinic with any problems, questions or concerns. We can certainly see the patient much sooner if necessary.  Disclaimer: This note was dictated with voice recognition software. Similar sounding words can inadvertently be transcribed and may not be corrected upon review.

## 2018-11-16 ENCOUNTER — Other Ambulatory Visit: Payer: Self-pay | Admitting: Thoracic Surgery (Cardiothoracic Vascular Surgery)

## 2018-11-28 ENCOUNTER — Telehealth: Payer: Self-pay | Admitting: Internal Medicine

## 2018-11-28 NOTE — Telephone Encounter (Signed)
Copied from [place] [id number]. Topic: Quick Communication - Rx Refill/Question >> Nov 28, 2018  3:28 PM Margot Ables wrote: Medication: meloxicam (MOBIC) 7.5 MG tablet - pt daughter called stating pt is having pain again mainly in back and hips - requesting refill on medication as he is out  Has the patient contacted their pharmacy? No - no refills and worried with the holidays it would be delayed thru the pharmacy  Preferred Pharmacy (with phone number or street name): [facility], [place] [phone] (Phone) [phone] (Fax)

## 2018-11-28 NOTE — Telephone Encounter (Signed)
RX was filled by Dr. Hulan Saas

## 2018-12-05 ENCOUNTER — Other Ambulatory Visit: Payer: Self-pay | Admitting: Internal Medicine

## 2018-12-05 DIAGNOSIS — E7849 Other hyperlipidemia: Secondary | ICD-10-CM

## 2018-12-15 ENCOUNTER — Other Ambulatory Visit: Payer: Self-pay | Admitting: Internal Medicine

## 2018-12-15 MED ORDER — MELOXICAM 7.5 MG PO TABS: 7.5000 mg | ORAL_TABLET | Freq: Every day | ORAL | 0 refills | Status: DC

## 2018-12-15 NOTE — Telephone Encounter (Signed)
Spoke to pt's daughter. Sent in refill of meloxicam to pt's pharmacy.

## 2018-12-15 NOTE — Telephone Encounter (Signed)
Patient is calling to follow up on this.  °

## 2018-12-15 NOTE — Addendum Note (Signed)
Addended by: Douglass Rivers T on: 12/15/2018 03:46 PM   Modules accepted: Orders

## 2018-12-20 ENCOUNTER — Ambulatory Visit: Payer: PPO | Admitting: Thoracic Surgery (Cardiothoracic Vascular Surgery)

## 2018-12-20 ENCOUNTER — Other Ambulatory Visit: Payer: Self-pay

## 2018-12-20 ENCOUNTER — Encounter: Payer: Self-pay | Admitting: Thoracic Surgery (Cardiothoracic Vascular Surgery)

## 2018-12-20 VITALS — BP 99/72 | HR 65 | Resp 16 | Ht 70.0 in | Wt 206.0 lb

## 2018-12-20 DIAGNOSIS — C450 Mesothelioma of pleura: Secondary | ICD-10-CM

## 2018-12-20 NOTE — Progress Notes (Signed)
[address]       [place] [address]             [phone]     HPI: [name] returns for an annual follow-up visit regarding his ascending aneurysm.  Dean Pruitt is an 85 year old man with a history of thyroid cancer, a 4.2 cm ascending aneurysm, hyperlipidemia, arthritis, early dementia, and stage II malignant mesothelioma status post pleurectomy followed by chemotherapy in March 2018.  He still suffer some from depression and has some memory loss due to early dementia.  He is not having any shortness of breath.  He occasionally will feel some pain related to his incision with a cough or deep breath.  He saw Dean Pruitt in December.  There was no evidence of recurrent disease.  Past Medical History:  Diagnosis Date  . Cataract   . Depression    situational  . GERD (gastroesophageal reflux disease)   . Goals of care, counseling/discussion 03/20/2017  . Hyperlipidemia   . Hypothyroidism   . Reactive depression 03/20/2017  . Thyroid cancer [facility])    PMH of; on supressive therapy    Current Outpatient Medications  Medication Sig Dispense Refill  . aspirin 81 MG tablet Take 81 mg by mouth daily.    . bifidobacterium infantis (ALIGN) capsule Take by mouth.    . donepezil (ARICEPT) 10 MG tablet TAKE 1 TABLET BY MOUTH AT BEDTIME 90 tablet 3  . escitalopram (LEXAPRO) 20 MG tablet TAKE 1 TABLET BY MOUTH ONCE DAILY AT BEDTIME 90 tablet 3  . gabapentin (NEURONTIN) 300 MG capsule TAKE 1 CAPSULE BY MOUTH AT NIGHT -- Office visit needed for further refills 90 capsule 0  . levothyroxine (SYNTHROID, LEVOTHROID) 150 MCG tablet TAKE 1 TABLET BY MOUTH ONCE DAILY 90 tablet 0  . meloxicam (MOBIC) 7.5 MG tablet Take 1 tablet (7.5 mg total) by mouth daily. 90 tablet 0  . omeprazole (PRILOSEC) 20 MG capsule TAKE 1 CAPSULE BY MOUTH ONCE DAILY 90 capsule 0  . rosuvastatin (CRESTOR) 20 MG tablet TAKE 1 TABLET BY MOUTH ONCE DAILY 90 tablet 0  . lidocaine-prilocaine (EMLA) cream  Apply 1 application topically as needed. (Patient not taking: Reported on 12/20/2018) 30 g 0   No current facility-administered medications for this visit.     Physical Exam BP 99/72 (BP Location: Right Arm, Patient Position: Sitting, Cuff Size: Large)   Pulse 65   Resp 16   Ht 5\' 10"  (1.778 m)   Wt 206 lb (93.4 kg)   SpO2 94% Comment: ON RA  BMI 29.65 kg/m  85 year old man in no acute distress Alert and oriented x3 with no focal deficits No carotid bruits Cardiac regular rate and rhythm normal S1 and S2 Lungs clear with good breath sounds bilaterally  Diagnostic Tests: CT CHEST WITH CONTRAST  TECHNIQUE: Multidetector CT imaging of the chest was performed during intravenous contrast administration.  CONTRAST:  67mL OMNIPAQUE IOHEXOL 300 MG/ML  SOLN  COMPARISON:  CT 05/06/2018  FINDINGS: Cardiovascular: Coronary artery calcification and aortic atherosclerotic calcification. Port in the anterior chest wall with tip in distal SVC.  Mediastinum/Nodes: No axillary supraclavicular adenopathy. No mediastinal hilar adenopathy. No pericardial effusion. Esophagus normal.  Lungs/Pleura: Volume loss in the LEFT hemithorax. Mild pleural-parenchymal thickening at the LEFT lung base. No suspicious nodularity. Fine reticular pattern in the peripheral aspect LEFT lower lobe is also unchanged. RIGHT lung clear.  Upper Abdomen: Limited view of the liver, kidneys, pancreas are unremarkable. Normal  adrenal glands.  Musculoskeletal: No aggressive osseous lesion.  IMPRESSION: 1. No evidence of lung cancer recurrence on chest CT. 2. Postsurgical change in the LEFT hemithorax 3. Coronary artery calcification and Aortic Atherosclerosis (ICD10-I70.0).   Electronically Signed   By: Dean Pruitt M.D.   On: 11/07/2018 16:59 Personally reviewed the CT images and concur with the findings noted above.  Although not noted in the report, the ascending aortic aneurysm is stable  at 4.2 cm  Impression: Dean Pruitt is an 85 year old man with a history of stage II malignant mesothelioma status post pleurectomy followed by chemotherapy, hyperlipidemia, arthritis, depression, and a 4.2 cm ascending aneurysm.  Malignant mesothelioma-stable with no evidence of recurrent disease at this time.  Will need continued follow-up.  That is being done by Dean Pruitt.  Ascending aneurysm-stable will need continued follow-up.  I will continue to see him once a year.  Plan: Return in 1 year after CT done by Dr. Thad Ranger, MD [facility] [phone]

## 2018-12-27 ENCOUNTER — Ambulatory Visit: Payer: PPO | Admitting: Thoracic Surgery (Cardiothoracic Vascular Surgery)

## 2019-01-16 NOTE — Progress Notes (Signed)
Subjective:    Patient ID: Dean Pruitt, male    DOB: 08-Feb-1934, 85 y.o.   MRN: [id number]  HPI The patient is here for follow up.  Right ear hurts intermittently.  Putting pressure on it makes it worse.  He denies changes in his hearing.    Hyperlipidemia: He is taking his medication daily. He is compliant with a low fat/cholesterol diet. He is not exercising regularly. He denies myalgias.   Mild cognitive impairment:  He is taking aricept daily.   Depression: He is taking his medication daily as prescribed. He denies any side effects from the medication. He feels his depression is well controlled and he is happy with his current dose of medication.   Hypothyroidism:  He is taking his medication daily.  He denies any recent changes in energy or weight that are unexplained.  His daughter thinks he has been more fatigued.    Chronic lower back pain;  He is taking gabapentin.  It does help.     Medications and allergies reviewed with patient and updated if appropriate.  Patient Active Problem List   Diagnosis Date Noted  . Loose stools 08/05/2018  . Ankle swelling, right 08/05/2018  . Urinary incontinence 08/05/2018  . Greater trochanteric bursitis, right 01/27/2018  . Otalgia, right 12/21/2017  . Bursitis of hip 11/17/2017  . Bilateral low back pain without sciatica 11/17/2017  . Thoracic aortic aneurysm without rupture ([place]) 11/09/2017  . Muscular chest pain 11/09/2017  . Recurrent falls 11/09/2017  . Hypothyroidism 10/13/2017  . B12 deficiency 10/13/2017  . Change in stool 10/13/2017  . Port catheter in place 06/02/2017  . Degenerative arthritis of left knee 03/24/2017  . Encounter for antineoplastic chemotherapy 03/20/2017  . Reactive depression 03/20/2017  . Malignant mesothelioma of pleura ([place]) 02/19/2017  . History of lung biopsy 02/17/2017  . Greater trochanteric bursitis of left hip 12/31/2016  . Pleural effusion, left 10/27/2016  . infrarenal  abdominal aortic aneurysm without rupture ([place]) - [place] needed 2020 10/22/2016  . Left lumbar radiculopathy 10/12/2016  . Bursitis of left hip 09/30/2016  . Left foot drop 09/30/2016  . Mild cognitive impairment 07/03/2016  . Adjustment disorder with depressed mood 07/03/2016  . Frequency of urination 09/26/2014  . Macular degeneration 06/22/2011  . Fasting hyperglycemia 05/20/2009  . NONSPECIFIC ABNORMAL ELECTROCARDIOGRAM 05/20/2009  . THYROID CANCER, HX OF 05/20/2009  . History of colonic polyps 05/20/2009  . Hyperlipidemia 11/21/2007    Current Outpatient Medications on File Prior to Visit  Medication Sig Dispense Refill  . aspirin 81 MG tablet Take 81 mg by mouth daily.    . bifidobacterium infantis (ALIGN) capsule Take by mouth.    . donepezil (ARICEPT) 10 MG tablet TAKE 1 TABLET BY MOUTH AT BEDTIME 90 tablet 3  . escitalopram (LEXAPRO) 20 MG tablet TAKE 1 TABLET BY MOUTH ONCE DAILY AT BEDTIME 90 tablet 3  . gabapentin (NEURONTIN) 300 MG capsule TAKE 1 CAPSULE BY MOUTH AT NIGHT -- Office visit needed for further refills 90 capsule 0  . lidocaine-prilocaine (EMLA) cream Apply 1 application topically as needed. 30 g 0  . meloxicam (MOBIC) 7.5 MG tablet Take 1 tablet (7.5 mg total) by mouth daily. 90 tablet 0   No current facility-administered medications on file prior to visit.     Past Medical History:  Diagnosis Date  . Cataract   . Depression    situational  . GERD (gastroesophageal reflux disease)   . Goals of care, counseling/discussion 03/20/2017  .  Hyperlipidemia   . Hypothyroidism   . Reactive depression 03/20/2017  . Thyroid cancer ([place])    PMH of; on supressive therapy    Past Surgical History:  Procedure Laterality Date  . CATARACT EXTRACTION, BILATERAL  2013 & 2014   Dr Celene Squibb  . colon polyps  2002 & 2005   negative 2008; Dr Earlean Shawl  . COLONOSCOPY W/ POLYPECTOMY    . LUNG BIOPSY Left 02/15/2017   Procedure: LUNG BIOPSY;  Surgeon: Melrose Nakayama, MD;   Location: [facility];  Service: Thoracic;  Laterality: Left;  . PLEURAL EFFUSION DRAINAGE Left 02/15/2017   Procedure: DRAINAGE OF PLEURAL EFFUSION;  Surgeon: Melrose Nakayama, MD;  Location: [facility];  Service: Thoracic;  Laterality: Left;  . PORTACATH PLACEMENT N/A 03/25/2017   Procedure: INSERTION PORT-A-CATH, right internal jugular;  Surgeon: Melrose Nakayama, MD;  Location: [facility];  Service: Thoracic;  Laterality: N/A;  . THYROIDECTOMY  2001   S/P RAI  . VIDEO ASSISTED THORACOSCOPY Left 02/15/2017   Procedure: VIDEO ASSISTED THORACOSCOPY;  Surgeon: Melrose Nakayama, MD;  Location: [facility];  Service: Thoracic;  Laterality: Left;    Social History   Socioeconomic History  . Marital status: Married    Spouse name: Not on file  . Number of children: 2  . Years of education: Not on file  . Highest education level: Not on file  Occupational History  . Occupation: Retired  Scientific laboratory technician  . Financial resource strain: Not on file  . Food insecurity:    Worry: Not on file    Inability: Not on file  . Transportation needs:    Medical: Not on file    Non-medical: Not on file  Tobacco Use  . Smoking status: Former Smoker    Packs/day: 1.00    Years: 5.00    Pack years: 5.00    Types: Cigarettes    Last attempt to quit: 12/07/1958    Years since quitting: 60.1  . Smokeless tobacco: Never Used  . Tobacco comment: smoked 1956-1960, up 1 ppd  Substance and Sexual Activity  . Alcohol use: Yes    Alcohol/week: 14.0 standard drinks    Types: 14 Glasses of wine per week  . Drug use: No  . Sexual activity: Not on file  Lifestyle  . Physical activity:    Days per week: Not on file    Minutes per session: Not on file  . Stress: Not on file  Relationships  . Social connections:    Talks on phone: Not on file    Gets together: Not on file    Attends religious service: Not on file    Active member of club or organization: Not on file    Attends meetings of clubs or organizations: Not on  file    Relationship status: Not on file  Other Topics Concern  . Not on file  Social History Narrative   Exercise:  Yard work, walks on occasion                Family History  Problem Relation Age of Onset  . Alzheimer's disease Mother   . Hypertension Mother   . Heart disease Father        CAD and angioplasty in 20s  . Cancer Father        Bladder  . Other Brother        valvular heart disease  . Diabetes Neg Hx   . Stroke Neg Hx  Review of Systems  Constitutional: Negative for appetite change, chills, fatigue and fever.  Respiratory: Negative for cough, shortness of breath and wheezing.   Cardiovascular: Positive for chest pain (left anterior pain wth deep breaths only ). Negative for palpitations and leg swelling.  Gastrointestinal: Negative for abdominal pain, constipation, diarrhea and nausea.       Occ gerd  Genitourinary: Negative for difficulty urinating, dysuria and hematuria.        incontinence  Neurological: Negative for light-headedness and headaches.  Psychiatric/Behavioral: Negative for dysphoric mood and sleep disturbance. The patient is not nervous/anxious.        Objective:   Vitals:   01/17/19 1330  BP: 102/72  Pulse: 78  Resp: 14  Temp: 99 F (37.2 C)  SpO2: 94%   BP Readings from Last 3 Encounters:  01/17/19 102/72  12/20/18 99/72  11/09/18 116/84   Wt Readings from Last 3 Encounters:  01/17/19 200 lb 6.4 oz (90.9 kg)  12/20/18 206 lb (93.4 kg)  11/09/18 204 lb (92.5 kg)   Body mass index is 28.75 kg/m.   Physical Exam    Constitutional: Appears well-developed and well-nourished. No distress.  HENT: Normocephalic and atraumatic. B/l ear canals and TM's normal.  No TMJ pain. Neck supple. No tracheal deviation present. No thyromegaly present.  No cervical lymphadenopathy Cardiovascular: Normal rate, regular rhythm and normal heart sounds.   No murmur heard. No carotid bruit .  No edema Pulmonary/Chest: Effort normal and  breath sounds normal. No respiratory distress. No has no wheezes. No rales.  Skin: Skin is warm and dry. Not diaphoretic.  Psychiatric: Normal mood and affect. Behavior is normal.      Assessment & Plan:    See Problem List for Assessment and Plan of chronic medical problems.

## 2019-01-17 ENCOUNTER — Encounter: Payer: Self-pay | Admitting: Internal Medicine

## 2019-01-17 ENCOUNTER — Telehealth: Payer: Self-pay

## 2019-01-17 ENCOUNTER — Other Ambulatory Visit (INDEPENDENT_AMBULATORY_CARE_PROVIDER_SITE_OTHER): Payer: PPO

## 2019-01-17 ENCOUNTER — Ambulatory Visit (INDEPENDENT_AMBULATORY_CARE_PROVIDER_SITE_OTHER): Payer: PPO | Admitting: Internal Medicine

## 2019-01-17 VITALS — BP 102/72 | HR 78 | Temp 99.0°F | Resp 14 | Ht 70.0 in | Wt 200.4 lb

## 2019-01-17 DIAGNOSIS — F329 Major depressive disorder, single episode, unspecified: Secondary | ICD-10-CM | POA: Diagnosis not present

## 2019-01-17 DIAGNOSIS — E7849 Other hyperlipidemia: Secondary | ICD-10-CM

## 2019-01-17 DIAGNOSIS — E038 Other specified hypothyroidism: Secondary | ICD-10-CM

## 2019-01-17 DIAGNOSIS — M545 Low back pain, unspecified: Secondary | ICD-10-CM

## 2019-01-17 DIAGNOSIS — M5416 Radiculopathy, lumbar region: Secondary | ICD-10-CM

## 2019-01-17 DIAGNOSIS — G3184 Mild cognitive impairment, so stated: Secondary | ICD-10-CM

## 2019-01-17 DIAGNOSIS — R32 Unspecified urinary incontinence: Secondary | ICD-10-CM

## 2019-01-17 DIAGNOSIS — R7301 Impaired fasting glucose: Secondary | ICD-10-CM

## 2019-01-17 DIAGNOSIS — H9201 Otalgia, right ear: Secondary | ICD-10-CM | POA: Diagnosis not present

## 2019-01-17 LAB — COMPREHENSIVE METABOLIC PANEL
ALT: 31 U/L (ref 0–53)
AST: 42 U/L — ABNORMAL HIGH (ref 0–37)
Albumin: 4.3 g/dL (ref 3.5–5.2)
Alkaline Phosphatase: 54 U/L (ref 39–117)
BUN: 25 mg/dL — ABNORMAL HIGH (ref 6–23)
CO2: 25 mEq/L (ref 19–32)
CREATININE: 1.27 mg/dL (ref 0.40–1.50)
Calcium: 9.7 mg/dL (ref 8.4–10.5)
Chloride: 103 mEq/L (ref 96–112)
GFR: 54.23 mL/min — ABNORMAL LOW (ref >60.00)
Glucose, Bld: 108 mg/dL — ABNORMAL HIGH (ref 70–99)
Potassium: 4.6 mEq/L (ref 3.5–5.1)
Sodium: 138 mEq/L (ref 135–145)
Total Bilirubin: 1 mg/dL (ref 0.2–1.2)
Total Protein: 6.5 g/dL (ref 6.0–8.3)

## 2019-01-17 LAB — CBC WITH DIFFERENTIAL/PLATELET
BASOS ABS: 0 10*3/uL (ref 0.0–0.1)
Basophils Relative: 1 % (ref 0.0–3.0)
EOS ABS: 0.1 10*3/uL (ref 0.0–0.7)
Eosinophils Relative: 1.8 % (ref 0.0–5.0)
HCT: 49.2 % (ref 39.0–52.0)
Hemoglobin: 16.8 g/dL (ref 13.0–17.0)
Lymphocytes Relative: 28.3 % (ref 12.0–46.0)
Lymphs Abs: 1.3 10*3/uL (ref 0.7–4.0)
MCHC: 34.1 g/dL (ref 30.0–36.0)
MCV: 100.2 fl — ABNORMAL HIGH (ref 78.0–100.0)
Monocytes Absolute: 0.6 10*3/uL (ref 0.1–1.0)
Monocytes Relative: 12.4 % — ABNORMAL HIGH (ref 3.0–12.0)
Neutro Abs: 2.7 10*3/uL (ref 1.4–7.7)
Neutrophils Relative %: 56.5 % (ref 43.0–77.0)
Platelets: 166 10*3/uL (ref 150.0–400.0)
RBC: 4.91 Mil/uL (ref 4.22–5.81)
RDW: 13 % (ref 11.5–15.5)
WBC: 4.7 10*3/uL (ref 4.0–10.5)

## 2019-01-17 LAB — LIPID PANEL
Cholesterol: 152 mg/dL (ref 0–200)
HDL: 55.3 mg/dL (ref >39.00)
LDL Cholesterol: 80 mg/dL (ref 0–99)
NonHDL: 96.53
Total CHOL/HDL Ratio: 3
Triglycerides: 85 mg/dL (ref 0.0–149.0)
VLDL: 17 mg/dL (ref 0.0–40.0)

## 2019-01-17 LAB — HEMOGLOBIN A1C: Hgb A1c MFr Bld: 5.6 % (ref 4.6–6.5)

## 2019-01-17 LAB — TSH: TSH: 0.22 u[IU]/mL — ABNORMAL LOW (ref 0.35–4.50)

## 2019-01-17 MED ORDER — LEVOTHYROXINE SODIUM 150 MCG PO TABS: ORAL_TABLET | ORAL | 1 refills | Status: DC

## 2019-01-17 MED ORDER — ROSUVASTATIN CALCIUM 20 MG PO TABS: 20.0000 mg | ORAL_TABLET | Freq: Every day | ORAL | 1 refills | Status: DC

## 2019-01-17 MED ORDER — OMEPRAZOLE 20 MG PO CPDR: 20.0000 mg | DELAYED_RELEASE_CAPSULE | Freq: Every day | ORAL | 1 refills | Status: DC

## 2019-01-17 NOTE — Assessment & Plan Note (Signed)
a1c

## 2019-01-17 NOTE — Assessment & Plan Note (Signed)
Controlled, stable Continue current dose of medication  

## 2019-01-17 NOTE — Assessment & Plan Note (Addendum)
Clinically euthyroid ? More fatigued per daughter Check tsh  Titrate med dose if needed

## 2019-01-17 NOTE — Assessment & Plan Note (Signed)
Stable Continue aricept

## 2019-01-17 NOTE — Assessment & Plan Note (Signed)
Continue gabapentin.

## 2019-01-17 NOTE — Assessment & Plan Note (Signed)
Has seen urology Medication would possibly worsen dementia Urology advised going to the bathroom every 2 hours and avoiding medication

## 2019-01-17 NOTE — Patient Instructions (Addendum)

## 2019-01-17 NOTE — Telephone Encounter (Signed)
Copied from [place] [phone]. Topic: General - Other >> Jan 17, 2019 11:28 AM Carolyn Stare wrote:  Cecille Rubin pt daughter call and wanted D Burns to know before the appt that pt has been feeling really tired lately and not sure if he has been taking his morning meds and is asking if lab can be drawn today also bring up the conversation about his incontinent and bowels

## 2019-01-17 NOTE — Telephone Encounter (Signed)
noted 

## 2019-01-17 NOTE — Assessment & Plan Note (Signed)
Check lipid panel  Continue daily statin Regular exercise and healthy diet encouraged  

## 2019-01-17 NOTE — Assessment & Plan Note (Signed)
Has had recurrent R ear pain Exam is normal ? TMJ Symptomatic treatment

## 2019-01-17 NOTE — Telephone Encounter (Signed)
Patient being seen today at 1:30

## 2019-01-18 LAB — THYROGLOBULIN ANTIBODY: Thyroglobulin Ab: <1 IU/mL (ref <=1)

## 2019-03-03 IMAGING — CT CT CHEST W/ CM
3 of 5 series · 14 of 36 positions shown, 17 images · IV contrast (iopamidol)
Comparison: PET-CT 05/31/2017.

CLINICAL DATA: Restaging mesothelioma.

EXAM:
CT CHEST, ABDOMEN, AND PELVIS WITH CONTRAST
TECHNIQUE: Multidetector CT imaging of the chest, abdomen and pelvis was
performed following the standard protocol during bolus
administration of intravenous contrast.
CONTRAST:  100mL [id number] IOPAMIDOL ([id number]) INJECTION 61%

[Series 2: cap with · axial · 0.88mm/px · z∈[-597,-37]mm · 9 of 141 slices shown, 12 images]
[im 15/141  mediastinal]
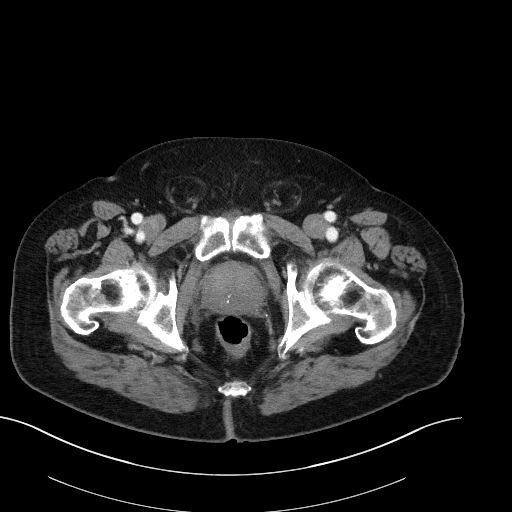
[im 15/141  lung]
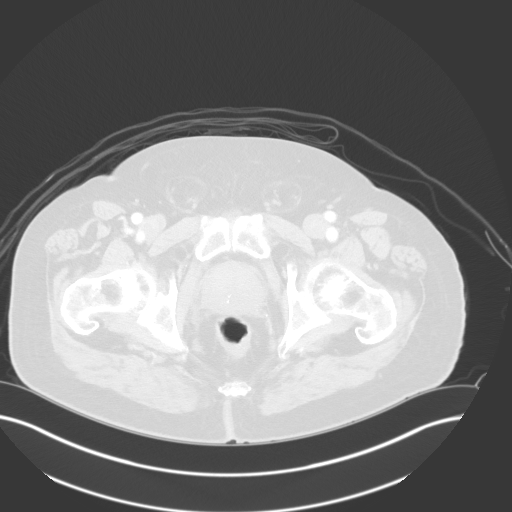
[im 29/141  lung]
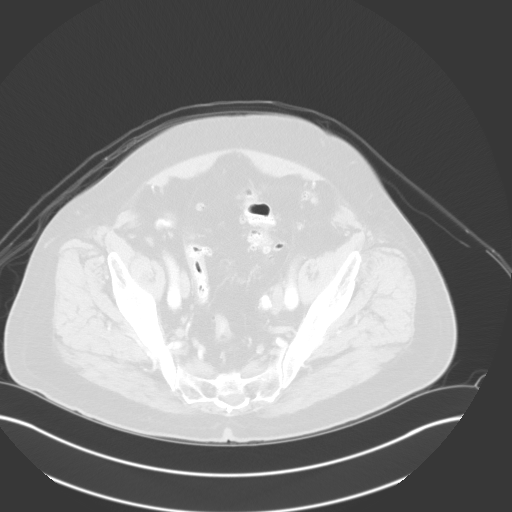
[im 43/141  lung]
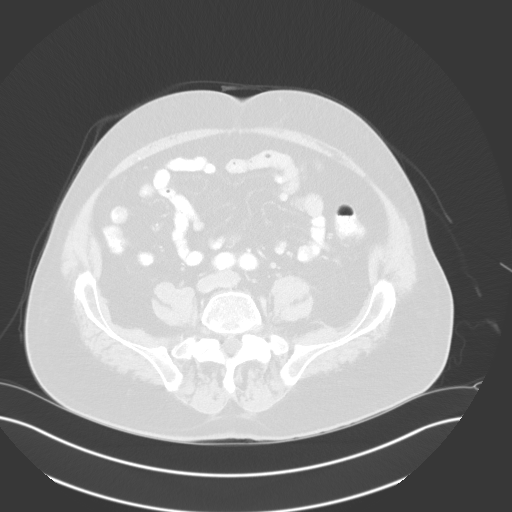
[im 57/141  lung]
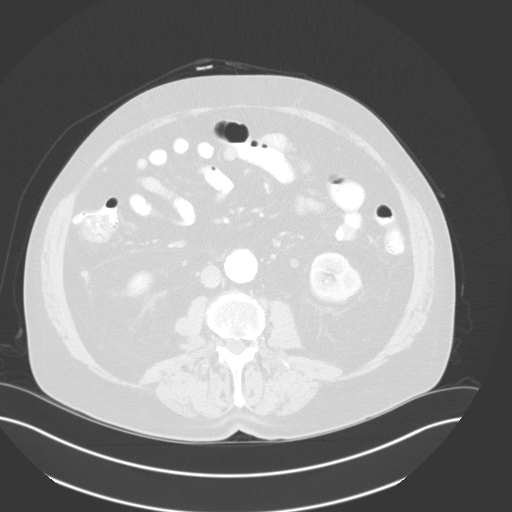
[im 71/141  mediastinal]
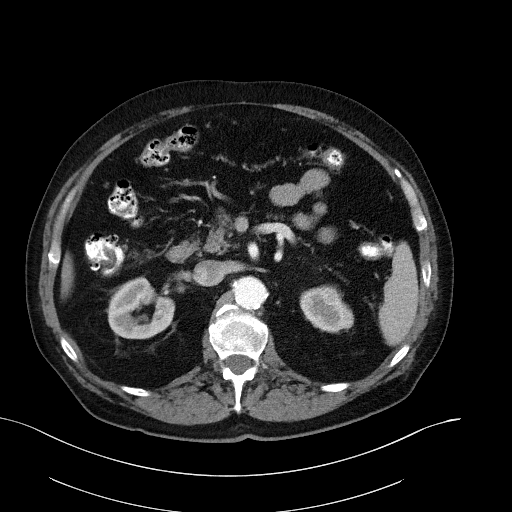
[im 71/141  lung]
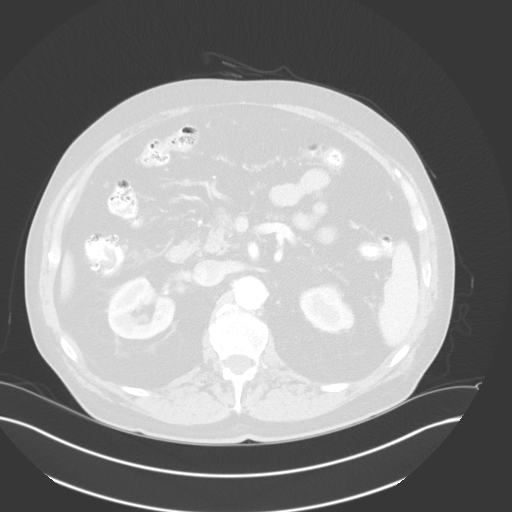
[im 85/141  lung]
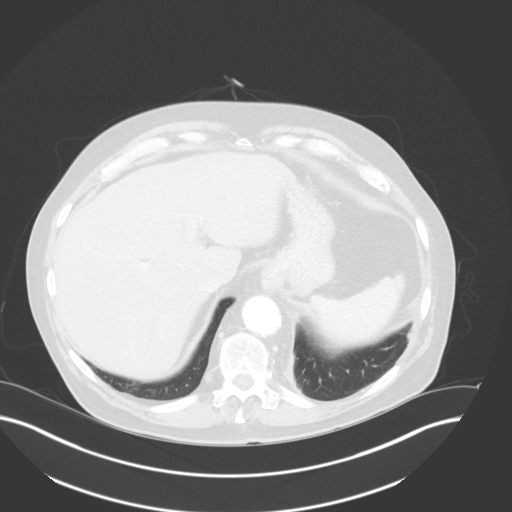
[im 99/141  lung]
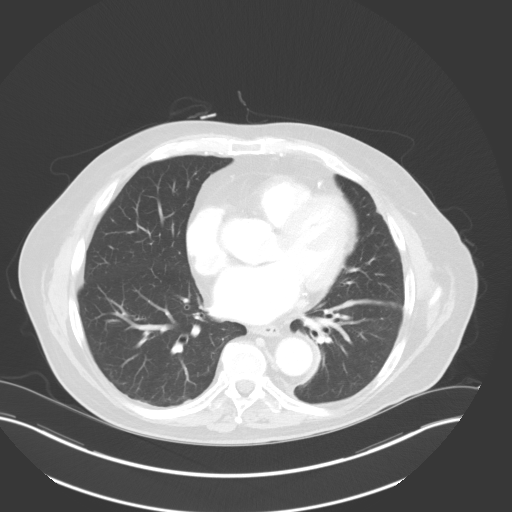
[im 113/141  lung]
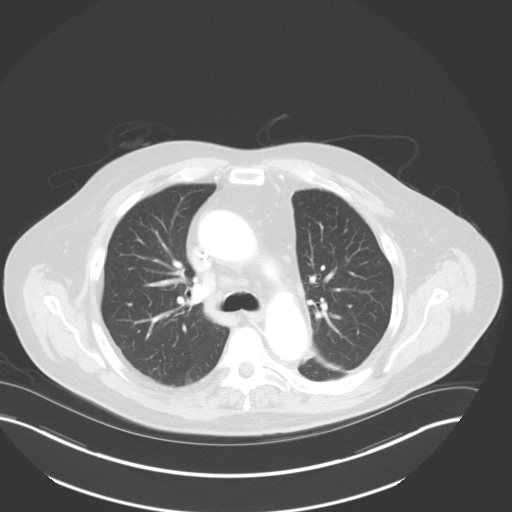
[im 127/141  mediastinal]
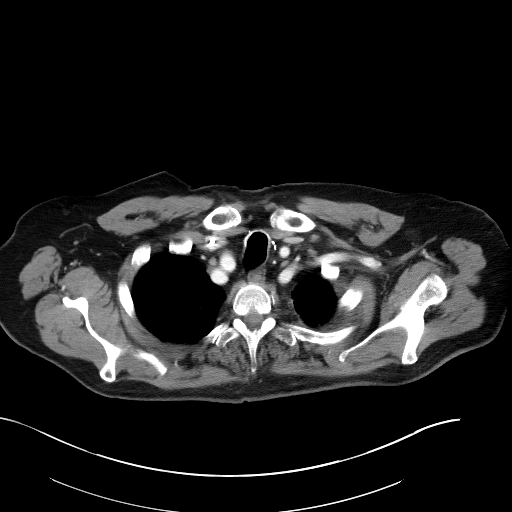
[im 127/141  lung]
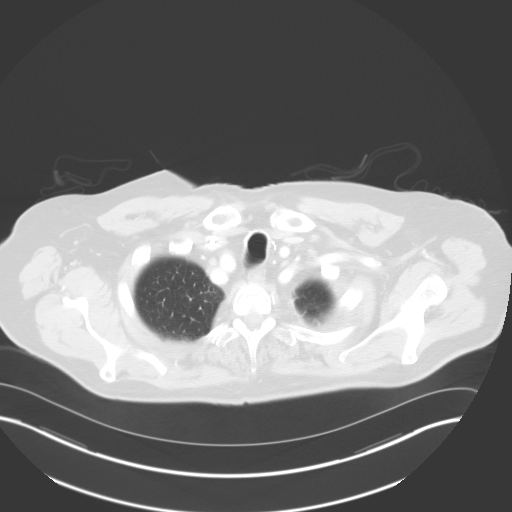

[Series 4: lung · axial · 0.88mm/px · z∈[-275,-223]mm · 2 of 167 slices shown]
[im 13/167  lung]
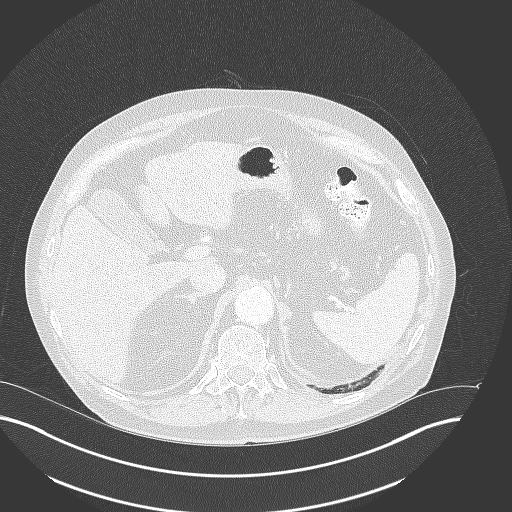
[im 39/167  lung]
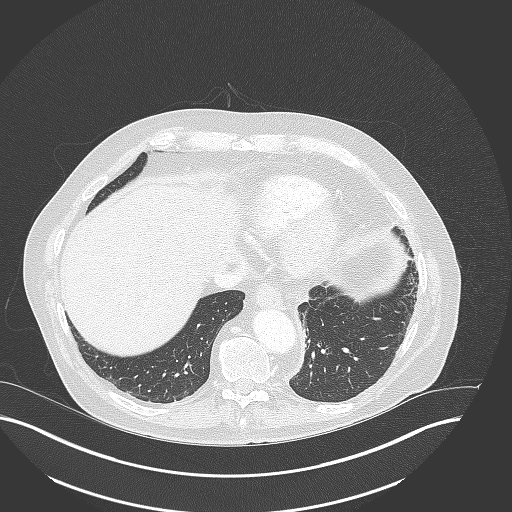

[Series 5: coronals · coronal · 1.00mm/px · 3 of 161 slices shown]
[im 33/161  lung]
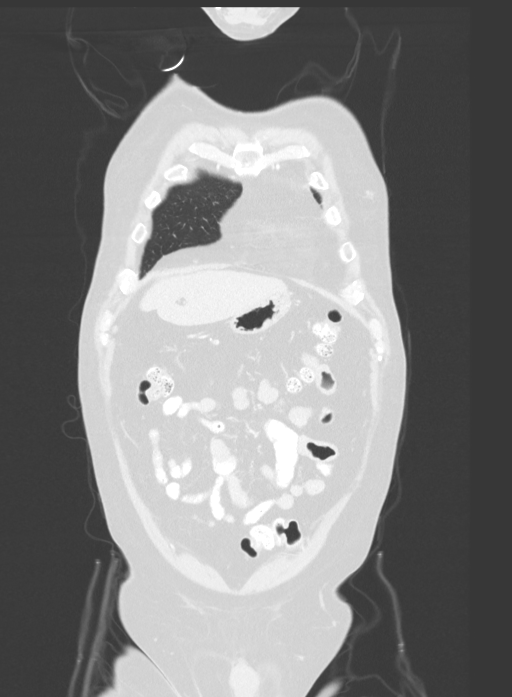
[im 65/161  lung]
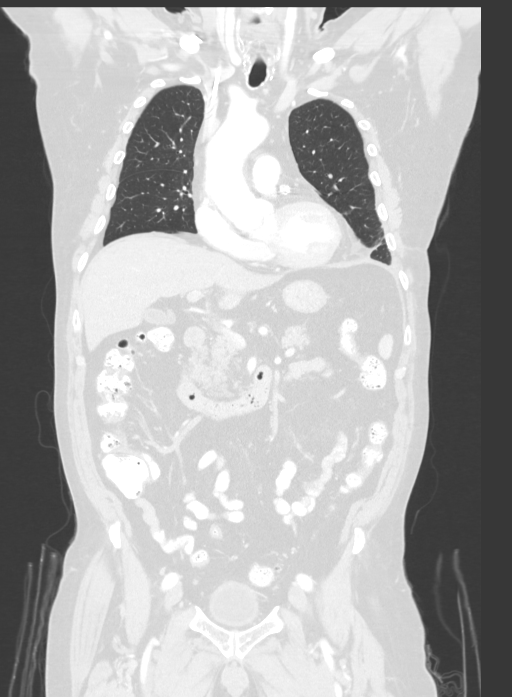
[im 97/161  lung]
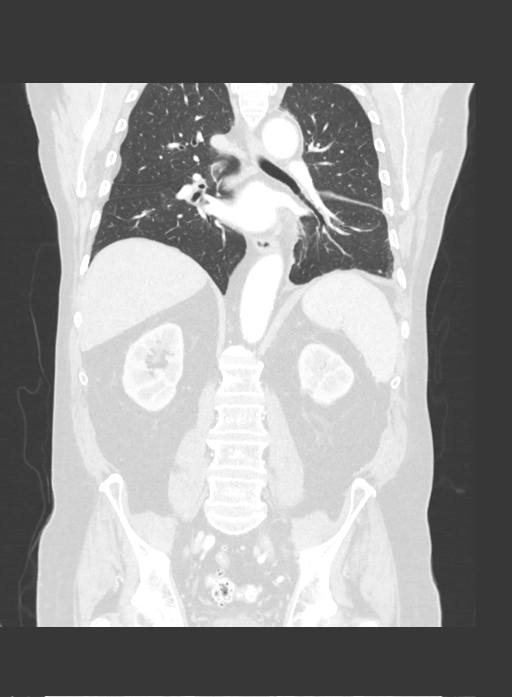

[14 of 36 positions shown; findings below may reference images not displayed]

FINDINGS: CT CHEST FINDINGS

Cardiovascular: The heart is within normal limits in size and
stable. There is tortuosity, ectasia and calcification of the
thoracic aorta. Stable three-vessel coronary artery calcifications.

Mediastinum/Nodes: Small scattered mediastinal and hilar lymph nodes
are stable. The esophagus is normal. Small hiatal hernia.

Lungs/Pleura: No acute pulmonary findings. No worrisome pulmonary
lesions. Stable postoperative changes involving the left hemithorax
with Stable mild thickening and calcification along the left major
fissure. No worrisome nodularity to suggest recurrent disease.
Stable 5 mm pleural nodule medially on image number 37. Stable 3 mm
nodule at the left lung base on image number 133.

Chest wall/ Musculoskeletal: No chest wall mass, supraclavicular or
axillary lymphadenopathy. The thyroid gland is surgically absent. A
right-sided Port-A-Cath is stable. New line no significant bony
findings.

CT ABDOMEN PELVIS FINDINGS

Hepatobiliary: No focal hepatic lesions or intrahepatic biliary
dilatation. The gallbladder is normal. No common bile duct
dilatation.

Pancreas: No mass, inflammation or ductal dilatation.

Spleen: Normal size.  No focal lesions.

Adrenals/Urinary Tract: The adrenal glands and kidneys are
unremarkable and stable. Small lower pole left renal calculus noted.
No ureteral or bladder calculi. No bladder mass.

Stomach/Bowel: The stomach, duodenum, small bowel and colon are
unremarkable. No acute inflammatory process, mass lesions or
obstructive findings. The terminal ileum is normal. The appendix is
normal. Scattered colonic diverticulosis without findings for acute
diverticulitis.

Vascular/Lymphatic: Stable tortuosity, ectasia and atherosclerotic
calcifications involving the abdominal aorta. Mild fusiform
aneurysmal dilatation with maximum measurements of 3.2 x 2.9 cm at
the level of the IMA.

No mesenteric or retroperitoneal mass or adenopathy.

Reproductive: Mild prostate gland enlargement impressing on the base
of the bladder. The seminal vesicles are normal.

Other: Stable bilateral inguinal hernias containing fat. No inguinal
mass or adenopathy.

Musculoskeletal: No significant bony findings.
IMPRESSION: 1. Stable CT appearance of the chest. Stable postoperative changes.
No findings suspicious for recurrent tumor or metastatic disease.
2. No significant findings in the abdomen/pelvis other than
incidental findings as described above.
3. Stable wall advanced atherosclerotic calcifications involving the
thoracic and abdominal aorta and branch vessels including the
coronary arteries.

## 2019-03-13 ENCOUNTER — Other Ambulatory Visit: Payer: Self-pay | Admitting: Family Medicine

## 2019-03-14 NOTE — Telephone Encounter (Signed)
Left message to call back in regards to setting up web visit for rx refill. Last seen in August 2019.

## 2019-03-15 NOTE — Telephone Encounter (Signed)
Left message for patient to call back for virtual visit.

## 2019-03-20 NOTE — Telephone Encounter (Signed)
Left message to call back for virtual visit.  

## 2019-03-29 ENCOUNTER — Other Ambulatory Visit: Payer: Self-pay

## 2019-03-29 MED ORDER — GABAPENTIN 300 MG PO CAPS: ORAL_CAPSULE | ORAL | 0 refills | Status: DC

## 2019-05-11 ENCOUNTER — Inpatient Hospital Stay: Payer: PPO | Attending: Internal Medicine

## 2019-05-11 DIAGNOSIS — Z9221 Personal history of antineoplastic chemotherapy: Secondary | ICD-10-CM | POA: Insufficient documentation

## 2019-05-11 DIAGNOSIS — Z79899 Other long term (current) drug therapy: Secondary | ICD-10-CM | POA: Insufficient documentation

## 2019-05-11 DIAGNOSIS — C450 Mesothelioma of pleura: Secondary | ICD-10-CM | POA: Insufficient documentation

## 2019-05-11 DIAGNOSIS — Z5112 Encounter for antineoplastic immunotherapy: Secondary | ICD-10-CM | POA: Insufficient documentation

## 2019-05-11 DIAGNOSIS — Z85850 Personal history of malignant neoplasm of thyroid: Secondary | ICD-10-CM | POA: Insufficient documentation

## 2019-05-11 DIAGNOSIS — J439 Emphysema, unspecified: Secondary | ICD-10-CM | POA: Insufficient documentation

## 2019-05-11 DIAGNOSIS — K219 Gastro-esophageal reflux disease without esophagitis: Secondary | ICD-10-CM | POA: Insufficient documentation

## 2019-05-11 DIAGNOSIS — Z923 Personal history of irradiation: Secondary | ICD-10-CM | POA: Insufficient documentation

## 2019-05-11 DIAGNOSIS — Z7982 Long term (current) use of aspirin: Secondary | ICD-10-CM | POA: Insufficient documentation

## 2019-05-11 DIAGNOSIS — E785 Hyperlipidemia, unspecified: Secondary | ICD-10-CM | POA: Insufficient documentation

## 2019-05-11 DIAGNOSIS — E039 Hypothyroidism, unspecified: Secondary | ICD-10-CM | POA: Insufficient documentation

## 2019-05-11 DIAGNOSIS — Z791 Long term (current) use of non-steroidal anti-inflammatories (NSAID): Secondary | ICD-10-CM | POA: Insufficient documentation

## 2019-05-11 DIAGNOSIS — I700 Atherosclerosis of aorta: Secondary | ICD-10-CM | POA: Insufficient documentation

## 2019-05-11 DIAGNOSIS — R5383 Other fatigue: Secondary | ICD-10-CM | POA: Insufficient documentation

## 2019-05-11 DIAGNOSIS — Z5111 Encounter for antineoplastic chemotherapy: Secondary | ICD-10-CM | POA: Insufficient documentation

## 2019-05-15 ENCOUNTER — Ambulatory Visit: Payer: PPO | Admitting: Internal Medicine

## 2019-05-15 ENCOUNTER — Telehealth: Payer: Self-pay | Admitting: Medical Oncology

## 2019-05-15 NOTE — Telephone Encounter (Signed)
Confirmed lab and CT appt with pt daughter.

## 2019-05-15 NOTE — Telephone Encounter (Signed)
LVM to return call. Appt is today but scan on Friday , [name] wants to r/ s appt to next week.

## 2019-05-16 ENCOUNTER — Telehealth: Payer: Self-pay | Admitting: Internal Medicine

## 2019-05-16 NOTE — Telephone Encounter (Signed)
Scheduled appt per sch msg. Called patients daughter and left msg.

## 2019-05-19 ENCOUNTER — Inpatient Hospital Stay: Payer: PPO

## 2019-05-19 ENCOUNTER — Ambulatory Visit (HOSPITAL_COMMUNITY)
Admission: RE | Admit: 2019-05-19 | Discharge: 2019-05-19 | Disposition: A | Discharge status: Home / Self Care | Payer: PPO | Source: Ambulatory Visit | Attending: Internal Medicine | Admitting: Internal Medicine

## 2019-05-19 ENCOUNTER — Other Ambulatory Visit: Payer: Self-pay

## 2019-05-19 ENCOUNTER — Other Ambulatory Visit: Payer: PPO

## 2019-05-19 DIAGNOSIS — Z9221 Personal history of antineoplastic chemotherapy: Secondary | ICD-10-CM | POA: Diagnosis not present

## 2019-05-19 DIAGNOSIS — I700 Atherosclerosis of aorta: Secondary | ICD-10-CM | POA: Diagnosis not present

## 2019-05-19 DIAGNOSIS — Z5112 Encounter for antineoplastic immunotherapy: Secondary | ICD-10-CM | POA: Diagnosis not present

## 2019-05-19 DIAGNOSIS — R5383 Other fatigue: Secondary | ICD-10-CM | POA: Diagnosis not present

## 2019-05-19 DIAGNOSIS — C450 Mesothelioma of pleura: Secondary | ICD-10-CM

## 2019-05-19 DIAGNOSIS — Z5111 Encounter for antineoplastic chemotherapy: Secondary | ICD-10-CM | POA: Diagnosis not present

## 2019-05-19 DIAGNOSIS — K219 Gastro-esophageal reflux disease without esophagitis: Secondary | ICD-10-CM | POA: Diagnosis not present

## 2019-05-19 DIAGNOSIS — E785 Hyperlipidemia, unspecified: Secondary | ICD-10-CM | POA: Diagnosis not present

## 2019-05-19 DIAGNOSIS — Z85850 Personal history of malignant neoplasm of thyroid: Secondary | ICD-10-CM | POA: Diagnosis not present

## 2019-05-19 DIAGNOSIS — J439 Emphysema, unspecified: Secondary | ICD-10-CM | POA: Diagnosis not present

## 2019-05-19 DIAGNOSIS — E039 Hypothyroidism, unspecified: Secondary | ICD-10-CM | POA: Diagnosis not present

## 2019-05-19 DIAGNOSIS — Z791 Long term (current) use of non-steroidal anti-inflammatories (NSAID): Secondary | ICD-10-CM | POA: Diagnosis not present

## 2019-05-19 DIAGNOSIS — Z923 Personal history of irradiation: Secondary | ICD-10-CM | POA: Diagnosis not present

## 2019-05-19 DIAGNOSIS — Z7982 Long term (current) use of aspirin: Secondary | ICD-10-CM | POA: Diagnosis not present

## 2019-05-19 DIAGNOSIS — Z79899 Other long term (current) drug therapy: Secondary | ICD-10-CM | POA: Diagnosis not present

## 2019-05-19 LAB — CMP (CANCER CENTER ONLY)
ALT: 53 U/L — ABNORMAL HIGH (ref 0–44)
AST: 83 U/L — ABNORMAL HIGH (ref 15–41)
Albumin: 4.1 g/dL (ref 3.5–5.0)
Alkaline Phosphatase: 76 U/L (ref 38–126)
Anion gap: 10 (ref 5–15)
BUN: 15 mg/dL (ref 8–23)
CO2: 24 mmol/L (ref 22–32)
Calcium: 9 mg/dL (ref 8.9–10.3)
Chloride: 106 mmol/L (ref 98–111)
Creatinine: 1.15 mg/dL (ref 0.61–1.24)
GFR, Est AFR Am: >60 mL/min (ref >60)
GFR, Estimated: 59 mL/min — ABNORMAL LOW (ref >60)
Glucose, Bld: 118 mg/dL — ABNORMAL HIGH (ref 70–99)
Potassium: 4.2 mmol/L (ref 3.5–5.1)
Sodium: 140 mmol/L (ref 135–145)
Total Bilirubin: 1.4 mg/dL — ABNORMAL HIGH (ref 0.3–1.2)
Total Protein: 6.7 g/dL (ref 6.5–8.1)

## 2019-05-19 LAB — CBC WITH DIFFERENTIAL (CANCER CENTER ONLY)
Abs Immature Granulocytes: 0.01 10*3/uL (ref 0.00–0.07)
Basophils Absolute: 0.1 10*3/uL (ref 0.0–0.1)
Basophils Relative: 1 %
Eosinophils Absolute: 0.1 10*3/uL (ref 0.0–0.5)
Eosinophils Relative: 2 %
HCT: 49.7 % (ref 39.0–52.0)
Hemoglobin: 16.4 g/dL (ref 13.0–17.0)
Immature Granulocytes: 0 %
Lymphocytes Relative: 25 %
Lymphs Abs: 1.3 10*3/uL (ref 0.7–4.0)
MCH: 34 pg (ref 26.0–34.0)
MCHC: 33 g/dL (ref 30.0–36.0)
MCV: 103.1 fL — ABNORMAL HIGH (ref 80.0–100.0)
Monocytes Absolute: 0.6 10*3/uL (ref 0.1–1.0)
Monocytes Relative: 11 %
Neutro Abs: 3.3 10*3/uL (ref 1.7–7.7)
Neutrophils Relative %: 61 %
Platelet Count: 144 10*3/uL — ABNORMAL LOW (ref 150–400)
RBC: 4.82 MIL/uL (ref 4.22–5.81)
RDW: 11.8 % (ref 11.5–15.5)
WBC Count: 5.4 10*3/uL (ref 4.0–10.5)
nRBC: 0 % (ref 0.0–0.2)

## 2019-05-19 MED ORDER — IOHEXOL 300 MG/ML  SOLN
75.0000 mL | Freq: Once | INTRAMUSCULAR | Status: AC | PRN
  Administered 2019-05-19: 75 mL via INTRAVENOUS

## 2019-05-19 MED ORDER — SODIUM CHLORIDE (PF) 0.9 % IJ SOLN
INTRAMUSCULAR | Status: AC
  Filled 2019-05-19: qty 50

## 2019-05-23 ENCOUNTER — Telehealth: Payer: Self-pay | Admitting: *Deleted

## 2019-05-23 NOTE — Telephone Encounter (Signed)
Received call from pt's daughter to inform us that her father's dementia has gotten worse as evidenced by confusion as to where he is, day of the week and short term memory problems.  She also states that he get short of breath at times but seems to be related to anxiety.   She would like to be called during the appt as she can't trust him to tell Dr. Julien Nordmann exactly how he feels and how he id doing overall Her # is [phone]

## 2019-05-24 ENCOUNTER — Encounter: Payer: Self-pay | Admitting: Internal Medicine

## 2019-05-24 ENCOUNTER — Inpatient Hospital Stay (HOSPITAL_BASED_OUTPATIENT_CLINIC_OR_DEPARTMENT_OTHER): Payer: PPO | Admitting: Internal Medicine

## 2019-05-24 ENCOUNTER — Other Ambulatory Visit: Payer: Self-pay

## 2019-05-24 VITALS — BP 127/91 | HR 77 | Temp 98.5°F | Resp 18 | Ht 70.0 in | Wt 204.0 lb

## 2019-05-24 DIAGNOSIS — Z791 Long term (current) use of non-steroidal anti-inflammatories (NSAID): Secondary | ICD-10-CM

## 2019-05-24 DIAGNOSIS — Z79899 Other long term (current) drug therapy: Secondary | ICD-10-CM

## 2019-05-24 DIAGNOSIS — K219 Gastro-esophageal reflux disease without esophagitis: Secondary | ICD-10-CM

## 2019-05-24 DIAGNOSIS — C450 Mesothelioma of pleura: Secondary | ICD-10-CM | POA: Diagnosis not present

## 2019-05-24 DIAGNOSIS — I700 Atherosclerosis of aorta: Secondary | ICD-10-CM | POA: Diagnosis not present

## 2019-05-24 DIAGNOSIS — E785 Hyperlipidemia, unspecified: Secondary | ICD-10-CM | POA: Diagnosis not present

## 2019-05-24 DIAGNOSIS — Z5111 Encounter for antineoplastic chemotherapy: Secondary | ICD-10-CM

## 2019-05-24 DIAGNOSIS — J439 Emphysema, unspecified: Secondary | ICD-10-CM | POA: Diagnosis not present

## 2019-05-24 DIAGNOSIS — R5383 Other fatigue: Secondary | ICD-10-CM | POA: Diagnosis not present

## 2019-05-24 DIAGNOSIS — E039 Hypothyroidism, unspecified: Secondary | ICD-10-CM | POA: Diagnosis not present

## 2019-05-24 DIAGNOSIS — Z85850 Personal history of malignant neoplasm of thyroid: Secondary | ICD-10-CM | POA: Diagnosis not present

## 2019-05-24 DIAGNOSIS — Z923 Personal history of irradiation: Secondary | ICD-10-CM | POA: Diagnosis not present

## 2019-05-24 DIAGNOSIS — Z7982 Long term (current) use of aspirin: Secondary | ICD-10-CM | POA: Diagnosis not present

## 2019-05-24 DIAGNOSIS — Z9221 Personal history of antineoplastic chemotherapy: Secondary | ICD-10-CM

## 2019-05-24 DIAGNOSIS — Z5112 Encounter for antineoplastic immunotherapy: Secondary | ICD-10-CM | POA: Diagnosis not present

## 2019-05-24 DIAGNOSIS — Z7189 Other specified counseling: Secondary | ICD-10-CM

## 2019-05-24 MED ORDER — CYANOCOBALAMIN 1000 MCG/ML IJ SOLN
1000.0000 ug | Freq: Once | INTRAMUSCULAR | Status: AC
  Administered 2019-05-24: 1000 ug via INTRAMUSCULAR

## 2019-05-24 MED ORDER — CYANOCOBALAMIN 1000 MCG/ML IJ SOLN
INTRAMUSCULAR | Status: AC
  Filled 2019-05-24: qty 1

## 2019-05-24 NOTE — Progress Notes (Addendum)
[facility] [phone]   [phone]  OFFICE PROGRESS NOTE  [name], MD [place] [address]  DIAGNOSIS: Stage II malignant pleural mesothelioma, epithelioid type involving the left hemithorax diagnosed in March 2018.  PRIOR THERAPY:  1) Status post left VATS with biopsy and wedge resection of the left lower lobe as well as parietal pleurectomy under the care of Dr. Roxan Hockey on 02/15/2017. 2) Palliative systemic chemotherapy with carboplatin for AUC of 5, Alimta 500 MG/M2 and Avastin 15 MG/KG every 3 weeks. Status post 6 cycles.  The patient has been on observation for almost 2 years.  CURRENT THERAPY: Resuming systemic chemotherapy with carboplatin for AUC of 5, Alimta 500 mg/M2 and Avastin 15 mg/KG every 3 weeks.  First dose May 31, 2019  INTERVAL HISTORY: Dean Pruitt 85 y.o. male returns to the clinic today for follow-up visit.  His daughter was available by phone during the visit.  The patient is feeling fine today with no concerning complaints except for intermittent pain on the left side of the chest as well as mild shortness of breath with exertion.  He denied having any recent weight loss or night sweats.  He has no nausea, vomiting, diarrhea or constipation.  He denied having any headache or visual changes.  He had repeat CT scan of the chest performed recently and is here for evaluation and discussion of his scan results.   MEDICAL HISTORY: Past Medical History:  Diagnosis Date   Cataract    Depression    situational   GERD (gastroesophageal reflux disease)    Goals of care, counseling/discussion 03/20/2017   Hyperlipidemia    Hypothyroidism    Reactive depression 03/20/2017   Thyroid cancer ([facility])    PMH of; on supressive therapy    ALLERGIES:  is allergic to ativan [lorazepam]; fentanyl; and tramadol.  MEDICATIONS:  Current Outpatient Medications  Medication Sig Dispense Refill    aspirin 81 MG tablet Take 81 mg by mouth daily.     bifidobacterium infantis (ALIGN) capsule Take by mouth.     donepezil (ARICEPT) 10 MG tablet TAKE 1 TABLET BY MOUTH AT BEDTIME 90 tablet 3   escitalopram (LEXAPRO) 20 MG tablet TAKE 1 TABLET BY MOUTH ONCE DAILY AT BEDTIME 90 tablet 3   gabapentin (NEURONTIN) 300 MG capsule TAKE 1 CAPSULE BY MOUTH AT NIGHT -- Office visit needed for further refills 90 capsule 0   levothyroxine (SYNTHROID, LEVOTHROID) 150 MCG tablet TAKE 1 TABLET BY MOUTH ONCE DAILY 90 tablet 1   lidocaine-prilocaine (EMLA) cream Apply 1 application topically as needed. 30 g 0   meloxicam (MOBIC) 7.5 MG tablet Take 1 tablet (7.5 mg total) by mouth daily. 90 tablet 0   omeprazole (PRILOSEC) 20 MG capsule Take 1 capsule (20 mg total) by mouth daily. 90 capsule 1   rosuvastatin (CRESTOR) 20 MG tablet Take 1 tablet (20 mg total) by mouth daily. 90 tablet 1   No current facility-administered medications for this visit.     SURGICAL HISTORY:  Past Surgical History:  Procedure Laterality Date   CATARACT EXTRACTION, BILATERAL  2013 & 2014   Dr Celene Squibb   colon polyps  2002 & 2005   negative 2008; Dr Earlean Shawl   COLONOSCOPY W/ POLYPECTOMY     LUNG BIOPSY Left 02/15/2017   Procedure: LUNG BIOPSY;  Surgeon: Melrose Nakayama, MD;  Location: [facility];  Service: Thoracic;  Laterality: Left;   PLEURAL EFFUSION DRAINAGE  Left 02/15/2017   Procedure: DRAINAGE OF PLEURAL EFFUSION;  Surgeon: Melrose Nakayama, MD;  Location: [facility];  Service: Thoracic;  Laterality: Left;   PORTACATH PLACEMENT N/A 03/25/2017   Procedure: INSERTION PORT-A-CATH, right internal jugular;  Surgeon: Melrose Nakayama, MD;  Location: [facility];  Service: Thoracic;  Laterality: N/A;   THYROIDECTOMY  2001   S/P RAI   VIDEO ASSISTED THORACOSCOPY Left 02/15/2017   Procedure: VIDEO ASSISTED THORACOSCOPY;  Surgeon: Melrose Nakayama, MD;  Location: [facility];  Service: Thoracic;  Laterality: Left;    REVIEW  OF SYSTEMS:  Constitutional: negative Eyes: negative Ears, nose, mouth, throat, and face: negative Respiratory: positive for dyspnea on exertion and pleurisy/chest pain Cardiovascular: negative Gastrointestinal: negative Genitourinary:negative Integument/breast: negative Hematologic/lymphatic: negative Musculoskeletal:negative Neurological: negative Behavioral/Psych: negative Endocrine: negative Allergic/Immunologic: negative   PHYSICAL EXAMINATION: General appearance: alert, cooperative, fatigued and no distress Head: Normocephalic, without obvious abnormality, atraumatic Neck: no adenopathy, no JVD, supple, symmetrical, trachea midline and thyroid not enlarged, symmetric, no tenderness/mass/nodules Lymph nodes: Cervical, supraclavicular, and axillary nodes normal. Resp: clear to auscultation bilaterally Back: symmetric, no curvature. ROM normal. No CVA tenderness. Cardio: regular rate and rhythm, S1, S2 normal, no murmur, click, rub or gallop GI: soft, non-tender; bowel sounds normal; no masses,  no organomegaly Extremities: extremities normal, atraumatic, no cyanosis or edema Neurologic: Alert and oriented X 3, normal strength and tone. Normal symmetric reflexes. Normal coordination and gait  ECOG PERFORMANCE STATUS: 1 - Symptomatic but completely ambulatory  Blood pressure (!) 127/91, pulse 77, temperature 98.5 F (36.9 C), temperature source Oral, resp. rate 18, height 5\' 10"  (1.778 m), weight 204 lb (92.5 kg), SpO2 95 %.  LABORATORY DATA: Lab Results  Component Value Date   WBC 5.4 05/19/2019   HGB 16.4 05/19/2019   HCT 49.7 05/19/2019   MCV 103.1 (H) 05/19/2019   PLT 144 (L) 05/19/2019      Chemistry      Component Value Date/Time   NA 140 05/19/2019 1015   NA 140 11/04/2017 1210   K 4.2 05/19/2019 1015   K 4.1 11/04/2017 1210   CL 106 05/19/2019 1015   CO2 24 05/19/2019 1015   CO2 26 11/04/2017 1210   BUN 15 05/19/2019 1015   BUN 20.1 11/04/2017 1210    CREATININE 1.15 05/19/2019 1015   CREATININE 1.2 11/04/2017 1210      Component Value Date/Time   CALCIUM 9.0 05/19/2019 1015   CALCIUM 9.6 11/04/2017 1210   ALKPHOS 76 05/19/2019 1015   ALKPHOS 62 11/04/2017 1210   AST 83 (H) 05/19/2019 1015   AST 37 (H) 11/04/2017 1210   ALT 53 (H) 05/19/2019 1015   ALT 22 11/04/2017 1210   BILITOT 1.4 (H) 05/19/2019 1015   BILITOT 1.32 (H) 11/04/2017 1210       RADIOGRAPHIC STUDIES: Ct Chest W Contrast  Result Date: 05/19/2019 CLINICAL DATA:  Restaging non-small cell lung cancer. EXAM: CT CHEST WITH CONTRAST TECHNIQUE: Multidetector CT imaging of the chest was performed during intravenous contrast administration. CONTRAST:  55mL OMNIPAQUE IOHEXOL 300 MG/ML  SOLN COMPARISON:  11/07/2018 FINDINGS: Cardiovascular: The heart is normal in size. No pericardial effusion. Stable slightly prominent pericardial and epicardial fat. Stable mild tortuosity, ectasia and calcification of the thoracic aorta. No dissection. The branch vessels are patent. Stable three-vessel coronary artery calcifications. Mediastinum/Nodes: New and progressive irregular pleural nodularity involving the left hemithorax consistent with recurrent pleural tumor. 11 mm lateral pleural nodule in the left upper lobe region on image number  26 previously measured 5 mm. 8 mm medial pleural nodule on image number 47 at the level of the aortic arch previously measured 4 mm. 9.5 mm medial pleural nodule on image number 74 previously measured 5.5 mm Nodular lesion adjacent to the esophagus on image number 117 measures approximately 30 x 19 mm and was much smaller on the prior study. 26.5 x 18 mm pleural lesion on image number 103 is also larger. Progressive nodularity along the left major fissure on image number 67. Lungs/Pleura: Underlying stable emphysematous changes. No parenchymal pulmonary lesions. No acute pulmonary findings. Upper Abdomen: No significant upper abdominal findings. Musculoskeletal: No  significant bony findings. IMPRESSION: 1. CT findings consistent with recurrent left-sided pleural tumor with several areas of progressive disease as detailed above. 2. No parenchymal lung lesions. 3. Stable atherosclerotic disease involving the aorta and coronary arteries. Aortic Atherosclerosis (ICD10-I70.0) and Emphysema (ICD10-J43.9). Electronically Signed   By: Marijo Sanes M.D.   On: 05/19/2019 12:55    ASSESSMENT AND PLAN:  This is a very pleasant 85 years old white male with malignant pleural mesothelioma involving the left hemothorax. He completed treatment with systemic chemotherapy with carboplatin, Alimta and Avastin status post 6 cycles. He has been tolerating the treatment fairly well with no significant adverse effects except for fatigue. The patient has been in observation for almost 2 years. He had repeat CT scan of the chest performed recently.  I personally and independently reviewed the scan images and discussed the results with the patient and his daughter. His a scan showed evidence for recurrent disease in the left side of the chest with several areas of progression compared to the previous imaging studies. I had a lengthy discussion with the patient and his daughter about his current condition and treatment options.  I discussed with the patient the option of continuous observation and monitoring with palliative care versus consideration of palliative systemic chemotherapy with carboplatin for AUC of 5, Alimta 500 mg/M2 on the Avastin 15 mg/KG every 3 weeks.  The patient and his daughter are interested in resuming his treatment with systemic chemotherapy and he is expected to start the first cycle of this treatment next week. We will arrange for the patient to receive vitamin B12 injection today. We will call his pharmacy with prescription for folic acid 1 mg p.o. daily in addition to Compazine 10 mg p.o. every 6 hours as needed for nausea as well as EMLA cream to be applied to  the Port-A-Cath site before treatment. He will come back for follow-up visit in 4 weeks for evaluation before starting cycle #2. The patient was advised to call immediately if he has any concerning symptoms in the interval. The patient voices understanding of current disease status and treatment options and is in agreement with the current care plan. All questions were answered. The patient knows to call the clinic with any problems, questions or concerns. We can certainly see the patient much sooner if necessary.  Disclaimer: This note was dictated with voice recognition software. Similar sounding words can inadvertently be transcribed and may not be corrected upon review.

## 2019-05-24 NOTE — Progress Notes (Signed)
DISCONTINUE ON PATHWAY REGIMEN - Mesothelioma     A cycle is every 21 days:     Pemetrexed      Carboplatin   **Always confirm dose/schedule in your pharmacy ordering system**  REASON: Disease Progression PRIOR TREATMENT: [id number]: Carboplatin AUC=5 + Pemetrexed 500 mg/m2 q21 Days x 4 Cycles TREATMENT RESPONSE: Progressive Disease (PD)  START ON PATHWAY REGIMEN - Mesothelioma     A cycle is every 21 days:     Pemetrexed      Carboplatin   **Always confirm dose/schedule in your pharmacy ordering system**  Patient Characteristics: Newly Diagnosed, Preoperative or Nonsurgical Candidate (Clinical Staging), Unresectable, First Line Therapeutic Status: Newly Diagnosed, Preoperative or Nonsurgical Candidate (Clinical Staging) AJCC T Category: cT3 AJCC N Category: cN1 AJCC 8 Stage Grouping: IIIA Resectability Status: Unresectable AJCC M Category: cM0 Intent of Therapy: Non-Curative / Palliative Intent, Discussed with Patient

## 2019-05-25 ENCOUNTER — Telehealth: Payer: Self-pay | Admitting: Medical Oncology

## 2019-05-25 DIAGNOSIS — Z95828 Presence of other vascular implants and grafts: Secondary | ICD-10-CM

## 2019-05-25 DIAGNOSIS — C450 Mesothelioma of pleura: Secondary | ICD-10-CM

## 2019-05-25 DIAGNOSIS — Z5111 Encounter for antineoplastic chemotherapy: Secondary | ICD-10-CM

## 2019-05-25 MED ORDER — PROCHLORPERAZINE MALEATE 10 MG PO TABS: 10.0000 mg | ORAL_TABLET | Freq: Four times a day (QID) | ORAL | 0 refills | Status: DC | PRN

## 2019-05-25 MED ORDER — FOLIC ACID 1 MG PO TABS: 1.0000 mg | ORAL_TABLET | Freq: Every day | ORAL | 2 refills | Status: DC

## 2019-05-25 MED ORDER — LIDOCAINE-PRILOCAINE 2.5-2.5 % EX CREA: 1.0000 "application " | TOPICAL_CREAM | CUTANEOUS | 0 refills | Status: AC | PRN

## 2019-05-25 NOTE — Telephone Encounter (Signed)
The pharmacy did not receive his folic acid, EMLA and compazine rx for pre chemo. Orders sent to [facility].

## 2019-05-26 ENCOUNTER — Telehealth: Payer: Self-pay | Admitting: Internal Medicine

## 2019-05-26 ENCOUNTER — Telehealth: Payer: Self-pay | Admitting: Medical Oncology

## 2019-05-26 NOTE — Telephone Encounter (Addendum)
Chemo education - Daughter wants information about what treatment her father is getting and his care needs. She will be his caregiver. He is ambulatory ,does well , but has some dementia. Message sent to chemo educators.

## 2019-05-26 NOTE — Telephone Encounter (Signed)
Called and spoke with patients daughter. Confirmed dates and times of appts

## 2019-05-29 ENCOUNTER — Telehealth: Payer: Self-pay | Admitting: Internal Medicine

## 2019-05-29 ENCOUNTER — Other Ambulatory Visit: Payer: Self-pay | Admitting: Internal Medicine

## 2019-05-29 ENCOUNTER — Telehealth: Payer: Self-pay | Admitting: *Deleted

## 2019-05-29 MED ORDER — DEXAMETHASONE 4 MG PO TABS: ORAL_TABLET | ORAL | 1 refills | Status: AC

## 2019-05-29 NOTE — Telephone Encounter (Signed)
Received vm message from pt's daughter, Cecille Rubin inquiring about a prescription for steroids related to resuming his chemotherapy. Sge sates there was not a prescription for steroids when she picked up others last week.  Please advise

## 2019-05-29 NOTE — Telephone Encounter (Signed)
Done

## 2019-05-29 NOTE — Telephone Encounter (Signed)
I CALLED Dean Pruitt regarding education

## 2019-05-29 NOTE — Telephone Encounter (Signed)
TCT to pt's daughter, Cecille Rubin. Advised of prescription for Dexamethasone sent to pt's pharmacy and she also asked about chemo education class. Pt starts chemo on Wednesday, 05/31/19 and she wants to be the one called for this @ [phone]

## 2019-05-30 ENCOUNTER — Inpatient Hospital Stay: Payer: PPO

## 2019-05-30 ENCOUNTER — Telehealth: Payer: Self-pay | Admitting: *Deleted

## 2019-05-31 ENCOUNTER — Inpatient Hospital Stay: Payer: PPO

## 2019-05-31 ENCOUNTER — Encounter: Payer: Self-pay | Admitting: Internal Medicine

## 2019-05-31 ENCOUNTER — Other Ambulatory Visit: Payer: Self-pay

## 2019-05-31 VITALS — BP 110/79 | HR 71 | Temp 98.7°F | Resp 18 | Wt 205.5 lb

## 2019-05-31 DIAGNOSIS — Z95828 Presence of other vascular implants and grafts: Secondary | ICD-10-CM

## 2019-05-31 DIAGNOSIS — C450 Mesothelioma of pleura: Secondary | ICD-10-CM

## 2019-05-31 DIAGNOSIS — Z5112 Encounter for antineoplastic immunotherapy: Secondary | ICD-10-CM | POA: Diagnosis not present

## 2019-05-31 LAB — CMP (CANCER CENTER ONLY)
ALT: 51 U/L — ABNORMAL HIGH (ref 0–44)
AST: 62 U/L — ABNORMAL HIGH (ref 15–41)
Albumin: 4.1 g/dL (ref 3.5–5.0)
Alkaline Phosphatase: 67 U/L (ref 38–126)
Anion gap: 12 (ref 5–15)
BUN: 33 mg/dL — ABNORMAL HIGH (ref 8–23)
CO2: 20 mmol/L — ABNORMAL LOW (ref 22–32)
Calcium: 9.1 mg/dL (ref 8.9–10.3)
Chloride: 108 mmol/L (ref 98–111)
Creatinine: 1.31 mg/dL — ABNORMAL HIGH (ref 0.61–1.24)
GFR, Est AFR Am: 58 mL/min — ABNORMAL LOW (ref >60)
GFR, Estimated: 50 mL/min — ABNORMAL LOW (ref >60)
Glucose, Bld: 149 mg/dL — ABNORMAL HIGH (ref 70–99)
Potassium: 4.6 mmol/L (ref 3.5–5.1)
Sodium: 140 mmol/L (ref 135–145)
Total Bilirubin: 1.2 mg/dL (ref 0.3–1.2)
Total Protein: 7.1 g/dL (ref 6.5–8.1)

## 2019-05-31 LAB — CBC WITH DIFFERENTIAL (CANCER CENTER ONLY)
Abs Immature Granulocytes: 0.03 10*3/uL (ref 0.00–0.07)
Basophils Absolute: 0 10*3/uL (ref 0.0–0.1)
Basophils Relative: 0 %
Eosinophils Absolute: 0 10*3/uL (ref 0.0–0.5)
Eosinophils Relative: 0 %
HCT: 46.1 % (ref 39.0–52.0)
Hemoglobin: 15.8 g/dL (ref 13.0–17.0)
Immature Granulocytes: 0 %
Lymphocytes Relative: 7 %
Lymphs Abs: 0.7 10*3/uL (ref 0.7–4.0)
MCH: 34.1 pg — ABNORMAL HIGH (ref 26.0–34.0)
MCHC: 34.3 g/dL (ref 30.0–36.0)
MCV: 99.4 fL (ref 80.0–100.0)
Monocytes Absolute: 0.6 10*3/uL (ref 0.1–1.0)
Monocytes Relative: 5 %
Neutro Abs: 9.5 10*3/uL — ABNORMAL HIGH (ref 1.7–7.7)
Neutrophils Relative %: 88 %
Platelet Count: 182 10*3/uL (ref 150–400)
RBC: 4.64 MIL/uL (ref 4.22–5.81)
RDW: 11.5 % (ref 11.5–15.5)
WBC Count: 10.9 10*3/uL — ABNORMAL HIGH (ref 4.0–10.5)
nRBC: 0 % (ref 0.0–0.2)

## 2019-05-31 LAB — TOTAL PROTEIN, URINE DIPSTICK: Protein, ur: 30 mg/dL — AB

## 2019-05-31 MED ORDER — PALONOSETRON HCL INJECTION 0.25 MG/5ML
INTRAVENOUS | Status: AC
  Filled 2019-05-31: qty 5

## 2019-05-31 MED ORDER — SODIUM CHLORIDE 0.9% FLUSH
10.0000 mL | INTRAVENOUS | Status: DC | PRN
  Administered 2019-05-31: 10 mL
  Filled 2019-05-31: qty 10

## 2019-05-31 MED ORDER — HEPARIN SOD (PORK) LOCK FLUSH 100 UNIT/ML IV SOLN
500.0000 [IU] | Freq: Once | INTRAVENOUS | Status: AC | PRN
  Administered 2019-05-31: 500 [IU]
  Filled 2019-05-31: qty 5

## 2019-05-31 MED ORDER — SODIUM CHLORIDE 0.9 % IV SOLN
449.0000 mg | Freq: Once | INTRAVENOUS | Status: AC
  Administered 2019-05-31: 450 mg via INTRAVENOUS
  Filled 2019-05-31: qty 45

## 2019-05-31 MED ORDER — SODIUM CHLORIDE 0.9% FLUSH
10.0000 mL | Freq: Once | INTRAVENOUS | Status: AC
  Administered 2019-05-31: 13:00:00 10 mL
  Filled 2019-05-31: qty 10

## 2019-05-31 MED ORDER — SODIUM CHLORIDE 0.9 % IV SOLN
Freq: Once | INTRAVENOUS | Status: AC
  Administered 2019-05-31: 15:00:00 via INTRAVENOUS
  Filled 2019-05-31: qty 5

## 2019-05-31 MED ORDER — PALONOSETRON HCL INJECTION 0.25 MG/5ML
0.2500 mg | Freq: Once | INTRAVENOUS | Status: AC
  Administered 2019-05-31: 0.25 mg via INTRAVENOUS

## 2019-05-31 MED ORDER — SODIUM CHLORIDE 0.9 % IV SOLN
1000.0000 mg | Freq: Once | INTRAVENOUS | Status: AC
  Administered 2019-05-31: 1000 mg via INTRAVENOUS
  Filled 2019-05-31: qty 40

## 2019-05-31 MED ORDER — SODIUM CHLORIDE 0.9 % IV SOLN
15.0000 mg/kg | Freq: Once | INTRAVENOUS | Status: AC
  Administered 2019-05-31: 1400 mg via INTRAVENOUS
  Filled 2019-05-31: qty 48

## 2019-05-31 MED ORDER — SODIUM CHLORIDE 0.9 % IV SOLN
Freq: Once | INTRAVENOUS | Status: AC
  Administered 2019-05-31: 14:00:00 via INTRAVENOUS
  Filled 2019-05-31: qty 250

## 2019-05-31 NOTE — Progress Notes (Signed)
Called pt and spoke to his daughter to introduce myself as his Arboriculturist.  Unfortunately there aren't any foundations offering copay assistance for his Dx and the type of ins he has.  I offered the [employer], went over what it covers and gave her the income requirement.  She stated she's pretty sure her dad makes over the requirement so he doesn't qualify for the grant at this time.  I will give him my card today for any questions or concerns he may have in the future.

## 2019-05-31 NOTE — Patient Instructions (Signed)
[facility] Discharge Instructions for Patients Receiving Chemotherapy  Today you received the following chemotherapy agents Avastin, Alimta, Carboplatin  To help prevent nausea and vomiting after your treatment, we encourage you to take your nausea medication as prescribed.   If you develop nausea and vomiting that is not controlled by your nausea medication, call the clinic.   BELOW ARE SYMPTOMS THAT SHOULD BE REPORTED IMMEDIATELY:  *FEVER GREATER THAN 100.5 F  *CHILLS WITH OR WITHOUT FEVER  NAUSEA AND VOMITING THAT IS NOT CONTROLLED WITH YOUR NAUSEA MEDICATION  *UNUSUAL SHORTNESS OF BREATH  *UNUSUAL BRUISING OR BLEEDING  TENDERNESS IN MOUTH AND THROAT WITH OR WITHOUT PRESENCE OF ULCERS  *URINARY PROBLEMS  *BOWEL PROBLEMS  UNUSUAL RASH Items with * indicate a potential emergency and should be followed up as soon as possible.  Feel free to call the clinic should you have any questions or concerns. The clinic phone number is [phone].  Please show the [facility] at check-in to the Emergency Department and triage What is this medicine? GEMCITABINE (jem SYE ta been) is a chemotherapy drug. This medicine is used to treat many types of cancer like breast cancer, lung cancer, pancreatic cancer, and ovarian cancer. This medicine may be used for other purposes; ask your health care provider or pharmacist if you have questions. COMMON BRAND NAME(S): Gemzar, Infugem What should I tell my health care provider before I take this medicine? They need to know if you have any of these conditions: -blood disorders -infection -kidney disease -liver disease -lung or breathing disease, like asthma -recent or ongoing radiation therapy -an unusual or allergic reaction to gemcitabine, other chemotherapy, other medicines, foods, dyes, or preservatives -pregnant or trying to get pregnant -breast-feeding How should I use this medicine? This drug is given as an  infusion into a vein. It is administered in a hospital or clinic by a specially trained health care professional. Talk to your pediatrician regarding the use of this medicine in children. Special care may be needed. Overdosage: If you think you have taken too much of this medicine contact a poison control center or emergency room at once. NOTE: This medicine is only for you. Do not share this medicine with others. What if I miss a dose? It is important not to miss your dose. Call your doctor or health care professional if you are unable to keep an appointment. What may interact with this medicine? -medicines to increase blood counts like filgrastim, pegfilgrastim, sargramostim -some other chemotherapy drugs like cisplatin -vaccines Talk to your doctor or health care professional before taking any of these medicines: -acetaminophen -aspirin -ibuprofen -ketoprofen -naproxen This list may not describe all possible interactions. Give your health care provider a list of all the medicines, herbs, non-prescription drugs, or dietary supplements you use. Also tell them if you smoke, drink alcohol, or use illegal drugs. Some items may interact with your medicine. What should I watch for while using this medicine? Visit your doctor for checks on your progress. This drug may make you feel generally unwell. This is not uncommon, as chemotherapy can affect healthy cells as well as cancer cells. Report any side effects. Continue your course of treatment even though you feel ill unless your doctor tells you to stop. In some cases, you may be given additional medicines to help with side effects. Follow all directions for their use. Call your doctor or health care professional for advice if you get a fever, chills or sore throat, or other  symptoms of a cold or flu. Do not treat yourself. This drug decreases your body's ability to fight infections. Try to avoid being around people who are sick. This medicine may  increase your risk to bruise or bleed. Call your doctor or health care professional if you notice any unusual bleeding. Be careful brushing and flossing your teeth or using a toothpick because you may get an infection or bleed more easily. If you have any dental work done, tell your dentist you are receiving this medicine. Avoid taking products that contain aspirin, acetaminophen, ibuprofen, naproxen, or ketoprofen unless instructed by your doctor. These medicines may hide a fever. Do not become pregnant while taking this medicine or for 6 months after stopping it. Women should inform their doctor if they wish to become pregnant or think they might be pregnant. Men should not father a child while taking this medicine and for 3 months after stopping it. There is a potential for serious side effects to an unborn child. Talk to your health care professional or pharmacist for more information. Do not breast-feed an infant while taking this medicine or for at least 1 week after stopping it. Men should inform their doctors if they wish to father a child. This medicine may lower sperm counts. Talk with your doctor or health care professional if you are concerned about your fertility. What side effects may I notice from receiving this medicine? Side effects that you should report to your doctor or health care professional as soon as possible: -allergic reactions like skin rash, itching or hives, swelling of the face, lips, or tongue -breathing problems -pain, redness, or irritation at site where injected -signs and symptoms of a dangerous change in heartbeat or heart rhythm like chest pain; dizziness; fast or irregular heartbeat; palpitations; feeling faint or lightheaded, falls; breathing problems -signs of decreased platelets or bleeding - bruising, pinpoint red spots on the skin, black, tarry stools, blood in the urine -signs of decreased red blood cells - unusually weak or tired, feeling faint or  lightheaded, falls -signs of infection - fever or chills, cough, sore throat, pain or difficulty passing urine -signs and symptoms of kidney injury like trouble passing urine or change in the amount of urine -signs and symptoms of liver injury like dark yellow or brown urine; general ill feeling or flu-like symptoms; light-colored stools; loss of appetite; nausea; right upper belly pain; unusually weak or tired; yellowing of the eyes or skin -swelling of ankles, feet, hands Side effects that usually do not require medical attention (report to your doctor or health care professional if they continue or are bothersome): -constipation -diarrhea -hair loss -loss of appetite -nausea -rash -vomiting This list may not describe all possible side effects. Call your doctor for medical advice about side effects. You may report side effects to FDA at 1-800-FDA-1088. Where should I keep my medicine? This drug is given in a hospital or clinic and will not be stored at home. NOTE: This sheet is a summary. It may not cover all possible information. If you have questions about this medicine, talk to your doctor, pharmacist, or health care provider.  2019 Elsevier/Gold Standard (2018-02-16 18  Bevacizumab injection (Avastin) What is this medicine? BEVACIZUMAB (be va SIZ yoo mab) is a monoclonal antibody. It is used to treat many types of cancer. This medicine may be used for other purposes; ask your health care provider or pharmacist if you have questions. COMMON BRAND NAME(S): Avastin, MVASI What should I tell my health  care provider before I take this medicine? They need to know if you have any of these conditions: -diabetes -heart disease -high blood pressure -history of coughing up blood -prior anthracycline chemotherapy (e.g., doxorubicin, daunorubicin, epirubicin) -recent or ongoing radiation therapy -recent or planning to have surgery -stroke -an unusual or allergic reaction to bevacizumab,  hamster proteins, mouse proteins, other medicines, foods, dyes, or preservatives -pregnant or trying to get pregnant -breast-feeding How should I use this medicine? This medicine is for infusion into a vein. It is given by a health care professional in a hospital or clinic setting. Talk to your pediatrician regarding the use of this medicine in children. Special care may be needed. Overdosage: If you think you have taken too much of this medicine contact a poison control center or emergency room at once. NOTE: This medicine is only for you. Do not share this medicine with others. What if I miss a dose? It is important not to miss your dose. Call your doctor or health care professional if you are unable to keep an appointment. What may interact with this medicine? Interactions are not expected. This list may not describe all possible interactions. Give your health care provider a list of all the medicines, herbs, non-prescription drugs, or dietary supplements you use. Also tell them if you smoke, drink alcohol, or use illegal drugs. Some items may interact with your medicine. What should I watch for while using this medicine? Your condition will be monitored carefully while you are receiving this medicine. You will need important blood work and urine testing done while you are taking this medicine. This medicine may increase your risk to bruise or bleed. Call your doctor or health care professional if you notice any unusual bleeding. This medicine should be started at least 28 days following major surgery and the site of the surgery should be totally healed. Check with your doctor before scheduling dental work or surgery while you are receiving this treatment. Talk to your doctor if you have recently had surgery or if you have a wound that has not healed. Do not become pregnant while taking this medicine or for 6 months after stopping it. Women should inform their doctor if they wish to become  pregnant or think they might be pregnant. There is a potential for serious side effects to an unborn child. Talk to your health care professional or pharmacist for more information. Do not breast-feed an infant while taking this medicine and for 6 months after the last dose. This medicine has caused ovarian failure in some women. This medicine may interfere with the ability to have a child. You should talk to your doctor or health care professional if you are concerned about your fertility. What side effects may I notice from receiving this medicine? Side effects that you should report to your doctor or health care professional as soon as possible: -allergic reactions like skin rash, itching or hives, swelling of the face, lips, or tongue -chest pain or chest tightness -chills -coughing up blood -high fever -seizures -severe constipation -signs and symptoms of bleeding such as bloody or black, tarry stools; red or dark-brown urine; spitting up blood or brown material that looks like coffee grounds; red spots on the skin; unusual bruising or bleeding from the eye, gums, or nose -signs and symptoms of a blood clot such as breathing problems; chest pain; severe, sudden headache; pain, swelling, warmth in the leg -signs and symptoms of a stroke like changes in vision; confusion; trouble speaking  or understanding; severe headaches; sudden numbness or weakness of the face, arm or leg; trouble walking; dizziness; loss of balance or coordination -stomach pain -sweating -swelling of legs or ankles -vomiting -weight gain Side effects that usually do not require medical attention (report to your doctor or health care professional if they continue or are bothersome): -back pain -changes in taste -decreased appetite -dry skin -nausea -tiredness This list may not describe all possible side effects. Call your doctor for medical advice about side effects. You may report side effects to FDA at  1-800-FDA-1088. Where should I keep my medicine? This drug is given in a hospital or clinic and will not be stored at home. NOTE: This sheet is a summary. It may not cover all possible information. If you have questions about this medicine, talk to your doctor, pharmacist, or health care provider.  2019 Elsevier/Gold Standard (2016-11-20 14:33:29)  Pemetrexed injection  (Alimta) What is this medicine? PEMETREXED (PEM e TREX ed) is a chemotherapy drug used to treat lung cancers like non-small cell lung cancer and mesothelioma. It may also be used to treat other cancers. This medicine may be used for other purposes; ask your health care provider or pharmacist if you have questions. COMMON BRAND NAME(S): Alimta What should I tell my health care provider before I take this medicine? They need to know if you have any of these conditions: -infection (especially a virus infection such as chickenpox, cold sores, or herpes) -kidney disease -low blood counts, like low white cell, platelet, or red cell counts -lung or breathing disease, like asthma -radiation therapy -an unusual or allergic reaction to pemetrexed, other medicines, foods, dyes, or preservative -pregnant or trying to get pregnant -breast-feeding How should I use this medicine? This drug is given as an infusion into a vein. It is administered in a hospital or clinic by a specially trained health care professional. Talk to your pediatrician regarding the use of this medicine in children. Special care may be needed. Overdosage: If you think you have taken too much of this medicine contact a poison control center or emergency room at once. NOTE: This medicine is only for you. Do not share this medicine with others. What if I miss a dose? It is important not to miss your dose. Call your doctor or health care professional if you are unable to keep an appointment. What may interact with this medicine? This medicine may interact with the  following medications: -Ibuprofen This list may not describe all possible interactions. Give your health care provider a list of all the medicines, herbs, non-prescription drugs, or dietary supplements you use. Also tell them if you smoke, drink alcohol, or use illegal drugs. Some items may interact with your medicine. What should I watch for while using this medicine? Visit your doctor for checks on your progress. This drug may make you feel generally unwell. This is not uncommon, as chemotherapy can affect healthy cells as well as cancer cells. Report any side effects. Continue your course of treatment even though you feel ill unless your doctor tells you to stop. In some cases, you may be given additional medicines to help with side effects. Follow all directions for their use. Call your doctor or health care professional for advice if you get a fever, chills or sore throat, or other symptoms of a cold or flu. Do not treat yourself. This drug decreases your body's ability to fight infections. Try to avoid being around people who are sick. This medicine may increase  your risk to bruise or bleed. Call your doctor or health care professional if you notice any unusual bleeding. Be careful brushing and flossing your teeth or using a toothpick because you may get an infection or bleed more easily. If you have any dental work done, tell your dentist you are receiving this medicine. Avoid taking products that contain aspirin, acetaminophen, ibuprofen, naproxen, or ketoprofen unless instructed by your doctor. These medicines may hide a fever. Call your doctor or health care professional if you get diarrhea or mouth sores. Do not treat yourself. To protect your kidneys, drink water or other fluids as directed while you are taking this medicine. Do not become pregnant while taking this medicine or for 6 months after stopping it. Women should inform their doctor if they wish to become pregnant or think they might  be pregnant. Men should not father a child while taking this medicine and for 3 months after stopping it. This may interfere with the ability to father a child. You should talk to your doctor or health care professional if you are concerned about your fertility. There is a potential for serious side effects to an unborn child. Talk to your health care professional or pharmacist for more information. Do not breast-feed an infant while taking this medicine or for 1 week after stopping it. What side effects may I notice from receiving this medicine? Side effects that you should report to your doctor or health care professional as soon as possible: -allergic reactions like skin rash, itching or hives, swelling of the face, lips, or tongue -breathing problems -redness, blistering, peeling or loosening of the skin, including inside the mouth -signs and symptoms of bleeding such as bloody or black, tarry stools; red or dark-brown urine; spitting up blood or brown material that looks like coffee grounds; red spots on the skin; unusual bruising or bleeding from the eye, gums, or nose -signs and symptoms of infection like fever or chills; cough; sore throat; pain or trouble passing urine -signs and symptoms of kidney injury like trouble passing urine or change in the amount of urine -signs and symptoms of liver injury like dark yellow or brown urine; general ill feeling or flu-like symptoms; light-colored stools; loss of appetite; nausea; right upper belly pain; unusually weak or tired; yellowing of the eyes or skin Side effects that usually do not require medical attention (report to your doctor or health care professional if they continue or are bothersome): -constipation -mouth sores -nausea, vomiting -unusually weak or tired This list may not describe all possible side effects. Call your doctor for medical advice about side effects. You may report side effects to FDA at 1-800-FDA-1088. Where should I keep  my medicine? This drug is given in a hospital or clinic and will not be stored at home. NOTE: This sheet is a summary. It may not cover all possible information. If you have questions about this medicine, talk to your doctor, pharmacist, or health care provider.  2019 Elsevier/Gold Standard (2018-01-12 16:11:33)  Carboplatin injection  (Paraplatin) What is this medicine? CARBOPLATIN (KAR boe pla tin) is a chemotherapy drug. It targets fast dividing cells, like cancer cells, and causes these cells to die. This medicine is used to treat ovarian cancer and many other cancers. This medicine may be used for other purposes; ask your health care provider or pharmacist if you have questions. COMMON BRAND NAME(S): Paraplatin What should I tell my health care provider before I take this medicine? They need to know if  you have any of these conditions: -blood disorders -hearing problems -kidney disease -recent or ongoing radiation therapy -an unusual or allergic reaction to carboplatin, cisplatin, other chemotherapy, other medicines, foods, dyes, or preservatives -pregnant or trying to get pregnant -breast-feeding How should I use this medicine? This drug is usually given as an infusion into a vein. It is administered in a hospital or clinic by a specially trained health care professional. Talk to your pediatrician regarding the use of this medicine in children. Special care may be needed. Overdosage: If you think you have taken too much of this medicine contact a poison control center or emergency room at once. NOTE: This medicine is only for you. Do not share this medicine with others. What if I miss a dose? It is important not to miss a dose. Call your doctor or health care professional if you are unable to keep an appointment. What may interact with this medicine? -medicines for seizures -medicines to increase blood counts like filgrastim, pegfilgrastim, sargramostim -some antibiotics like  amikacin, gentamicin, neomycin, streptomycin, tobramycin -vaccines Talk to your doctor or health care professional before taking any of these medicines: -acetaminophen -aspirin -ibuprofen -ketoprofen -naproxen This list may not describe all possible interactions. Give your health care provider a list of all the medicines, herbs, non-prescription drugs, or dietary supplements you use. Also tell them if you smoke, drink alcohol, or use illegal drugs. Some items may interact with your medicine. What should I watch for while using this medicine? Your condition will be monitored carefully while you are receiving this medicine. You will need important blood work done while you are taking this medicine. This drug may make you feel generally unwell. This is not uncommon, as chemotherapy can affect healthy cells as well as cancer cells. Report any side effects. Continue your course of treatment even though you feel ill unless your doctor tells you to stop. In some cases, you may be given additional medicines to help with side effects. Follow all directions for their use. Call your doctor or health care professional for advice if you get a fever, chills or sore throat, or other symptoms of a cold or flu. Do not treat yourself. This drug decreases your body's ability to fight infections. Try to avoid being around people who are sick. This medicine may increase your risk to bruise or bleed. Call your doctor or health care professional if you notice any unusual bleeding. Be careful brushing and flossing your teeth or using a toothpick because you may get an infection or bleed more easily. If you have any dental work done, tell your dentist you are receiving this medicine. Avoid taking products that contain aspirin, acetaminophen, ibuprofen, naproxen, or ketoprofen unless instructed by your doctor. These medicines may hide a fever. Do not become pregnant while taking this medicine. Women should inform their  doctor if they wish to become pregnant or think they might be pregnant. There is a potential for serious side effects to an unborn child. Talk to your health care professional or pharmacist for more information. Do not breast-feed an infant while taking this medicine. What side effects may I notice from receiving this medicine? Side effects that you should report to your doctor or health care professional as soon as possible: -allergic reactions like skin rash, itching or hives, swelling of the face, lips, or tongue -signs of infection - fever or chills, cough, sore throat, pain or difficulty passing urine -signs of decreased platelets or bleeding - bruising, pinpoint red spots  on the skin, black, tarry stools, nosebleeds -signs of decreased red blood cells - unusually weak or tired, fainting spells, lightheadedness -breathing problems -changes in hearing -changes in vision -chest pain -high blood pressure -low blood counts - This drug may decrease the number of white blood cells, red blood cells and platelets. You may be at increased risk for infections and bleeding. -nausea and vomiting -pain, swelling, redness or irritation at the injection site -pain, tingling, numbness in the hands or feet -problems with balance, talking, walking -trouble passing urine or change in the amount of urine Side effects that usually do not require medical attention (report to your doctor or health care professional if they continue or are bothersome): -hair loss -loss of appetite -metallic taste in the mouth or changes in taste This list may not describe all possible side effects. Call your doctor for medical advice about side effects. You may report side effects to FDA at 1-800-FDA-1088. Where should I keep my medicine? This drug is given in a hospital or clinic and will not be stored at home. NOTE: This sheet is a summary. It may not cover all possible information. If you have questions about this medicine,  talk to your doctor, pharmacist, or health care provider.  2019 Elsevier/Gold Standard (2008-02-28 14:38:05)  Coronavirus (COVID-19) Are you at risk?  Are you at risk for the Coronavirus (COVID-19)?  To be considered HIGH RISK for Coronavirus (COVID-19), you have to meet the following criteria:  . Traveled to [place], [place], [place], [place] or [place]; or in the [place] to [place], [place], [place], or [place]; and have fever, cough, and shortness of breath within the last 2 weeks of travel OR . Been in close contact with a person diagnosed with COVID-19 within the last 2 weeks and have fever, cough, and shortness of breath . IF YOU DO NOT MEET THESE CRITERIA, YOU ARE CONSIDERED LOW RISK FOR COVID-19.  What to do if you are HIGH RISK for COVID-19?  Marland Kitchen If you are having a medical emergency, call 911. . Seek medical care right away. Before you go to a doctor's office, urgent care or emergency department, call ahead and tell them about your recent travel, contact with someone diagnosed with COVID-19, and your symptoms. You should receive instructions from your physician's office regarding next steps of care.  . When you arrive at healthcare provider, tell the healthcare staff immediately you have returned from visiting [place], [place], [place], [place] or [place]; or traveled in the [place] to [place], [place], [place], or [place]; in the last two weeks or you have been in close contact with a person diagnosed with COVID-19 in the last 2 weeks.   . Tell the health care staff about your symptoms: fever, cough and shortness of breath. . After you have been seen by a medical provider, you will be either: o Tested for (COVID-19) and discharged home on quarantine except to seek medical care if symptoms worsen, and asked to  - Stay home and avoid contact with others until you get your results (4-5 days)  - Avoid travel on public transportation if possible (such as bus, train,  or airplane) or o Sent to the Emergency Department by EMS for evaluation, COVID-19 testing, and possible admission depending on your condition and test results.  What to do if you are LOW RISK for COVID-19?  Reduce your risk of any infection by using the same precautions used for avoiding the common  cold or flu:  Marland Kitchen Wash your hands often with soap and warm water for at least 20 seconds.  If soap and water are not readily available, use an alcohol-based hand sanitizer with at least 60% alcohol.  . If coughing or sneezing, cover your mouth and nose by coughing or sneezing into the elbow areas of your shirt or coat, into a tissue or into your sleeve (not your hands). . Avoid shaking hands with others and consider head nods or verbal greetings only. . Avoid touching your eyes, nose, or mouth with unwashed hands.  . Avoid close contact with people who are sick. . Avoid places or events with large numbers of people in one location, like concerts or sporting events. . Carefully consider travel plans you have or are making. . If you are planning any travel outside or inside the [place], visit the CDC's Travelers' Health webpage for the latest health notices. . If you have some symptoms but not all symptoms, continue to monitor at home and seek medical attention if your symptoms worsen. . If you are having a medical emergency, call 911.   [place] / e-Visit: [email or web address]         [facility]: [facility]: [phone]                   [facility]: [phone]

## 2019-06-01 NOTE — Telephone Encounter (Signed)
-----   Message from Rolene Course, RN sent at 05/31/2019  1:56 PM EDT ----- Regarding: Dr Julien Nordmann 1st Tx F/U call Dr. Julien Nordmann 1st Tx F/U call

## 2019-06-01 NOTE — Telephone Encounter (Signed)
TCT patient to follow up with him after 1st time Carboplatin, Avastin, Alimta on 05/31/19. Spoke with his daughter, Dean Pruitt.  She states he is still sleeping but did well last night. Denies fever, chills.  She does say he is not drinking as much fluids as he should. She will continue to encourage him to do this.  She is aware of his upcoming appts and knows to call with any questions or concerns.

## 2019-06-12 ENCOUNTER — Telehealth: Payer: Self-pay | Admitting: Medical Oncology

## 2019-06-12 NOTE — Telephone Encounter (Addendum)
Weak/dehydrated/confused-Spoke to dtr . Dean Pruitt is only drinking 3-4 oz fluid bid. He is confused at times. He is eating some but not walking a lot because he is weak. Schedule request sent for tomorrow appt lab, smc and ivf.

## 2019-06-12 NOTE — Telephone Encounter (Signed)
July 2- thinks pt is dehydrated. July 3rd- requesting call back re pt . 7/6- LVM to return my call .

## 2019-06-13 ENCOUNTER — Other Ambulatory Visit: Payer: Self-pay | Admitting: Emergency Medicine

## 2019-06-13 ENCOUNTER — Inpatient Hospital Stay: Payer: PPO | Attending: Internal Medicine | Admitting: Medical

## 2019-06-13 ENCOUNTER — Ambulatory Visit: Payer: PPO

## 2019-06-13 ENCOUNTER — Other Ambulatory Visit: Payer: Self-pay

## 2019-06-13 ENCOUNTER — Inpatient Hospital Stay: Payer: PPO

## 2019-06-13 VITALS — BP 130/72 | HR 53 | Temp 98.7°F | Resp 18 | Ht 70.0 in

## 2019-06-13 DIAGNOSIS — Z85850 Personal history of malignant neoplasm of thyroid: Secondary | ICD-10-CM | POA: Insufficient documentation

## 2019-06-13 DIAGNOSIS — Z7982 Long term (current) use of aspirin: Secondary | ICD-10-CM | POA: Diagnosis not present

## 2019-06-13 DIAGNOSIS — Z791 Long term (current) use of non-steroidal anti-inflammatories (NSAID): Secondary | ICD-10-CM | POA: Insufficient documentation

## 2019-06-13 DIAGNOSIS — R159 Full incontinence of feces: Secondary | ICD-10-CM

## 2019-06-13 DIAGNOSIS — Z8249 Family history of ischemic heart disease and other diseases of the circulatory system: Secondary | ICD-10-CM | POA: Diagnosis not present

## 2019-06-13 DIAGNOSIS — Z5112 Encounter for antineoplastic immunotherapy: Secondary | ICD-10-CM | POA: Insufficient documentation

## 2019-06-13 DIAGNOSIS — R531 Weakness: Secondary | ICD-10-CM

## 2019-06-13 DIAGNOSIS — G3184 Mild cognitive impairment, so stated: Secondary | ICD-10-CM | POA: Diagnosis not present

## 2019-06-13 DIAGNOSIS — B309 Viral conjunctivitis, unspecified: Secondary | ICD-10-CM | POA: Insufficient documentation

## 2019-06-13 DIAGNOSIS — Z5111 Encounter for antineoplastic chemotherapy: Secondary | ICD-10-CM | POA: Insufficient documentation

## 2019-06-13 DIAGNOSIS — E860 Dehydration: Secondary | ICD-10-CM

## 2019-06-13 DIAGNOSIS — R079 Chest pain, unspecified: Secondary | ICD-10-CM | POA: Diagnosis not present

## 2019-06-13 DIAGNOSIS — C3490 Malignant neoplasm of unspecified part of unspecified bronchus or lung: Secondary | ICD-10-CM

## 2019-06-13 DIAGNOSIS — E039 Hypothyroidism, unspecified: Secondary | ICD-10-CM | POA: Insufficient documentation

## 2019-06-13 DIAGNOSIS — C450 Mesothelioma of pleura: Secondary | ICD-10-CM | POA: Diagnosis not present

## 2019-06-13 DIAGNOSIS — K219 Gastro-esophageal reflux disease without esophagitis: Secondary | ICD-10-CM | POA: Diagnosis not present

## 2019-06-13 DIAGNOSIS — E785 Hyperlipidemia, unspecified: Secondary | ICD-10-CM | POA: Diagnosis not present

## 2019-06-13 DIAGNOSIS — I951 Orthostatic hypotension: Secondary | ICD-10-CM | POA: Insufficient documentation

## 2019-06-13 DIAGNOSIS — Z452 Encounter for adjustment and management of vascular access device: Secondary | ICD-10-CM | POA: Diagnosis not present

## 2019-06-13 DIAGNOSIS — Z87891 Personal history of nicotine dependence: Secondary | ICD-10-CM | POA: Insufficient documentation

## 2019-06-13 DIAGNOSIS — Z79899 Other long term (current) drug therapy: Secondary | ICD-10-CM | POA: Diagnosis not present

## 2019-06-13 DIAGNOSIS — Z95828 Presence of other vascular implants and grafts: Secondary | ICD-10-CM

## 2019-06-13 LAB — CMP (CANCER CENTER ONLY)
ALT: 59 U/L — ABNORMAL HIGH (ref 0–44)
AST: 81 U/L — ABNORMAL HIGH (ref 15–41)
Albumin: 3.3 g/dL — ABNORMAL LOW (ref 3.5–5.0)
Alkaline Phosphatase: 68 U/L (ref 38–126)
Anion gap: 10 (ref 5–15)
BUN: 17 mg/dL (ref 8–23)
CO2: 22 mmol/L (ref 22–32)
Calcium: 8.2 mg/dL — ABNORMAL LOW (ref 8.9–10.3)
Chloride: 107 mmol/L (ref 98–111)
Creatinine: 0.98 mg/dL (ref 0.61–1.24)
GFR, Est AFR Am: >60 mL/min (ref >60)
GFR, Estimated: >60 mL/min (ref >60)
Glucose, Bld: 103 mg/dL — ABNORMAL HIGH (ref 70–99)
Potassium: 4 mmol/L (ref 3.5–5.1)
Sodium: 139 mmol/L (ref 135–145)
Total Bilirubin: 1 mg/dL (ref 0.3–1.2)
Total Protein: 6.1 g/dL — ABNORMAL LOW (ref 6.5–8.1)

## 2019-06-13 LAB — CBC WITH DIFFERENTIAL (CANCER CENTER ONLY)
Abs Immature Granulocytes: 0.01 10*3/uL (ref 0.00–0.07)
Basophils Absolute: 0 10*3/uL (ref 0.0–0.1)
Basophils Relative: 0 %
Eosinophils Absolute: 0.2 10*3/uL (ref 0.0–0.5)
Eosinophils Relative: 9 %
HCT: 42.6 % (ref 39.0–52.0)
Hemoglobin: 14.9 g/dL (ref 13.0–17.0)
Immature Granulocytes: 0 %
Lymphocytes Relative: 31 %
Lymphs Abs: 0.8 10*3/uL (ref 0.7–4.0)
MCH: 34.1 pg — ABNORMAL HIGH (ref 26.0–34.0)
MCHC: 35 g/dL (ref 30.0–36.0)
MCV: 97.5 fL (ref 80.0–100.0)
Monocytes Absolute: 0.3 10*3/uL (ref 0.1–1.0)
Monocytes Relative: 10 %
Neutro Abs: 1.4 10*3/uL — ABNORMAL LOW (ref 1.7–7.7)
Neutrophils Relative %: 50 %
Platelet Count: 75 10*3/uL — ABNORMAL LOW (ref 150–400)
RBC: 4.37 MIL/uL (ref 4.22–5.81)
RDW: 11.4 % — ABNORMAL LOW (ref 11.5–15.5)
WBC Count: 2.7 10*3/uL — ABNORMAL LOW (ref 4.0–10.5)
nRBC: 0 % (ref 0.0–0.2)

## 2019-06-13 MED ORDER — SODIUM CHLORIDE 0.9% FLUSH
10.0000 mL | Freq: Once | INTRAVENOUS | Status: AC
  Administered 2019-06-13: 10 mL
  Filled 2019-06-13: qty 10

## 2019-06-13 MED ORDER — SODIUM CHLORIDE 0.9 % IV SOLN
Freq: Once | INTRAVENOUS | Status: AC
  Administered 2019-06-13: 15:00:00 via INTRAVENOUS
  Filled 2019-06-13: qty 250

## 2019-06-13 MED ORDER — HEPARIN SOD (PORK) LOCK FLUSH 100 UNIT/ML IV SOLN
500.0000 [IU] | Freq: Once | INTRAVENOUS | Status: AC
  Administered 2019-06-13: 500 [IU]
  Filled 2019-06-13: qty 5

## 2019-06-13 NOTE — Progress Notes (Signed)
[facility] Progress Note   Dean Pruitt [id number] [birth year] 85 y.o.  Dean Pruitt is managed by Dr. Julien Nordmann  Actively treated with chemotherapy/immunotherapy/hormonal therapy: yes  Current therapy: Carboplatin, Avastin, and Alimta  Last treated: 05/31/2019 (cycle 1, day 1)  Next scheduled appointment with provider: 06/22/2019  Assessment: Plan:    Weakness: [name] presents to the clinic today for evaluation of weakness, dehydration, and confusion: He has a history of mild cognitive impairment according to his chart.  He presents by himself.  He reports that he is eating and drinking.  His labs from 13 days ago showed that he was dehydrated however his labs today show that his BUN is at 17 and his creatinine is normal at 0.98.  He was given 1 L of normal saline today.  The patient stated that he had not been seen for a while and simply wanted a checkup when he was asked why he was here today.  He was last seen for treatment 13 days ago.  Malignant pleural mesothelioma: The patient is status post cycle 1, day 1 of carboplatin, Avastin, and Alimta which was dosed on 05/31/2019.  He is scheduled to be seen in follow-up on 06/22/2019.  Please see After Visit Summary for patient specific instructions.  Future Appointments  Date Time Provider [place]  06/22/2019 11:30 AM [facility] LAB 4 [facility] None  06/22/2019 12:00 PM Curt Bears, MD [facility] None  06/22/2019  1:00 PM [facility] INFUSION [facility] None  07/03/2019 10:50 AM Tomi Likens, Adam R, DO [facility] None  07/13/2019 11:30 AM [facility] LAB 4 [facility] None  07/13/2019 12:00 PM Curt Bears, MD [facility] None  07/13/2019  1:00 PM [facility] INFUSION [facility] None  07/18/2019  1:30 PM Burns, Claudina Lick, MD [facility]    No orders of the defined types were placed in this encounter.      Subjective:   Patient ID:  Dean Pruitt is a 85 y.o. (DOB 05-20-1934)  male.  Chief Complaint:  Chief Complaint  Patient presents with   Dehydration    HPI Dean Pruitt is an 85 year old male with a diagnosis of a malignant pleural mesothelioma, epithelioid type involving the left hemithorax which was originally diagnosed in March 2018 as a stage II. Mr. Beougher is status post a left VATS with biopsy and wedge resection of the left lower lobe as well as parietal pleurectomy under the care of Dr. Roxan Hockey which was done on 02/15/2017. He was initially treated with palliative systemic chemotherapy with carboplatin, Alimta and Avastin every 3 weeks. He was treated with 6 cycles before he was placed on observation for almost 2 years. He was last seen by Dr. Julien Nordmann on 05/24/2019. He resumed systemic chemotherapy with carboplatin, Alimta, and Avastin every 3 weeks with his first dose given on May 31, 2019. Daughter called yesterday stating that the patient was weak, dehydrated and confused. He is only drinking 3-4 oz fluid bid. He is confused at times. He is eating a little. He has not been walking a lot because he of his weak.  The patient reports that he has been eating and drinking.  He states that he came to clinic today simply because he wanted to have a checkup as he has not been seen for a while.  He denies fevers, chills, sweats, nausea, vomiting, constipation, or diarrhea.  He had an episode of fecal incontinence today when he went to the bathroom.  He states he cleaned himself up adequately and  declined the paper scrubs that we brought him to change into.  Medications: I have reviewed the patient's current medications.  Allergies:  Allergies  Allergen Reactions   Ativan [Lorazepam] Other (See Comments)    Mental status change-"went crazy in the head"   Fentanyl Other (See Comments)    hallucinations   Tramadol Other (See Comments)    Altered mental status "crazy in the head'    Past Medical History:  Diagnosis Date   Cataract     Depression    situational   GERD (gastroesophageal reflux disease)    Goals of care, counseling/discussion 03/20/2017   Hyperlipidemia    Hypothyroidism    Reactive depression 03/20/2017   Thyroid cancer [facility])    PMH of; on supressive therapy    Past Surgical History:  Procedure Laterality Date   CATARACT EXTRACTION, BILATERAL  2013 & 2014   Dr Celene Squibb   colon polyps  2002 & 2005   negative 2008; Dr Earlean Shawl   COLONOSCOPY W/ POLYPECTOMY     LUNG BIOPSY Left 02/15/2017   Procedure: LUNG BIOPSY;  Surgeon: Melrose Nakayama, MD;  Location: [place];  Service: Thoracic;  Laterality: Left;   PLEURAL EFFUSION DRAINAGE Left 02/15/2017   Procedure: DRAINAGE OF PLEURAL EFFUSION;  Surgeon: Melrose Nakayama, MD;  Location: [place];  Service: Thoracic;  Laterality: Left;   PORTACATH PLACEMENT N/A 03/25/2017   Procedure: INSERTION PORT-A-CATH, right internal jugular;  Surgeon: Melrose Nakayama, MD;  Location: [place];  Service: Thoracic;  Laterality: N/A;   THYROIDECTOMY  2001   S/P RAI   VIDEO ASSISTED THORACOSCOPY Left 02/15/2017   Procedure: VIDEO ASSISTED THORACOSCOPY;  Surgeon: Melrose Nakayama, MD;  Location: [place];  Service: Thoracic;  Laterality: Left;    Family History  Problem Relation Age of Onset   Alzheimer's disease Mother    Hypertension Mother    Heart disease Father        CAD and angioplasty in 67s   Cancer Father        Bladder   Other Brother        valvular heart disease   Diabetes Neg Hx    Stroke Neg Hx     Social History   Socioeconomic History   Marital status: Married    Spouse name: Not on file   Number of children: 2   Years of education: Not on file   Highest education level: Not on file  Occupational History   Occupation: Retired  Scientist, product/process development strain: Not on file   Food insecurity    Worry: Not on file    Inability: Not on Lexicographer needs    Medical: Not on file    Non-medical:  Not on file  Tobacco Use   Smoking status: Former Smoker    Packs/day: 1.00    Years: 5.00    Pack years: 5.00    Types: Cigarettes    Quit date: 12/07/1958    Years since quitting: 60.5   Smokeless tobacco: Never Used   Tobacco comment: smoked 1956-1960, up 1 ppd  Substance and Sexual Activity   Alcohol use: Yes    Alcohol/week: 14.0 standard drinks    Types: 14 Glasses of wine per week   Drug use: No   Sexual activity: Not on file  Lifestyle   Physical activity    Days per week: Not on file    Minutes per session: Not on file   Stress:  Not on file  Relationships   Social connections    Talks on phone: Not on file    Gets together: Not on file    Attends religious service: Not on file    Active member of club or organization: Not on file    Attends meetings of clubs or organizations: Not on file    Relationship status: Not on file   Intimate partner violence    Fear of current or ex partner: Not on file    Emotionally abused: Not on file    Physically abused: Not on file    Forced sexual activity: Not on file  Other Topics Concern   Not on file  Social History Narrative   Exercise:  Yard work, walks on occasion                Past Medical History, Surgical history, Social history, and Family history were reviewed and updated as appropriate.   Please see review of systems for further details on the patient's review from today.   Review of Systems:  Review of Systems  Constitutional: Negative for appetite change, chills, diaphoresis and fever.  HENT: Negative for dental problem, mouth sores and trouble swallowing.   Respiratory: Negative for cough, chest tightness and shortness of breath.   Cardiovascular: Negative for chest pain and palpitations.  Gastrointestinal: Negative for constipation, diarrhea, nausea and vomiting.  Neurological: Positive for weakness. Negative for dizziness, syncope and headaches.    Objective:   Physical Exam:  BP 109/82  (BP Location: Left Arm, Patient Position: Sitting)    Pulse 66    Temp 98.7 F (37.1 C) (Temporal)    Resp 18    Ht 5\' 10"  (1.778 m)    SpO2 99%    BMI 29.49 kg/m  ECOG: 1  Physical Exam Constitutional:      General: He is not in acute distress.    Appearance: He is not diaphoretic.  HENT:     Head: Normocephalic and atraumatic.  Eyes:     General: No scleral icterus.       Right eye: No discharge.        Left eye: No discharge.  Cardiovascular:     Rate and Rhythm: Normal rate and regular rhythm.     Heart sounds: Normal heart sounds. No murmur. No friction rub. No gallop.   Pulmonary:     Effort: Pulmonary effort is normal. No respiratory distress.     Breath sounds: Normal breath sounds. No wheezing or rales.  Abdominal:     General: Abdomen is flat. Bowel sounds are normal. There is no distension.     Palpations: Abdomen is soft.     Tenderness: There is no abdominal tenderness. There is no guarding.  Skin:    General: Skin is warm and dry.     Findings: No erythema or rash.  Neurological:     Mental Status: He is alert.     Coordination: Coordination normal.     Gait: Gait normal.     Lab Review:     Component Value Date/Time   NA 139 06/13/2019 1356   NA 140 11/04/2017 1210   K 4.0 06/13/2019 1356   K 4.1 11/04/2017 1210   CL 107 06/13/2019 1356   CO2 22 06/13/2019 1356   CO2 26 11/04/2017 1210   GLUCOSE 103 (H) 06/13/2019 1356   GLUCOSE 115 11/04/2017 1210   BUN 17 06/13/2019 1356   BUN 20.1 11/04/2017 1210   CREATININE  0.98 06/13/2019 1356   CREATININE 1.2 11/04/2017 1210   CALCIUM 8.2 (L) 06/13/2019 1356   CALCIUM 9.6 11/04/2017 1210   PROT 6.1 (L) 06/13/2019 1356   PROT 7.4 11/04/2017 1210   ALBUMIN 3.3 (L) 06/13/2019 1356   ALBUMIN 4.1 11/04/2017 1210   AST 81 (H) 06/13/2019 1356   AST 37 (H) 11/04/2017 1210   ALT 59 (H) 06/13/2019 1356   ALT 22 11/04/2017 1210   ALKPHOS 68 06/13/2019 1356   ALKPHOS 62 11/04/2017 1210   BILITOT 1.0 06/13/2019  1356   BILITOT 1.32 (H) 11/04/2017 1210   GFRNONAA >60 06/13/2019 1356   GFRAA >60 06/13/2019 1356       Component Value Date/Time   WBC 2.7 (L) 06/13/2019 1356   WBC 4.7 01/17/2019 1405   RBC 4.37 06/13/2019 1356   HGB 14.9 06/13/2019 1356   HGB 15.7 11/04/2017 1210   HCT 42.6 06/13/2019 1356   HCT 47.2 11/04/2017 1210   PLT 75 (L) 06/13/2019 1356   PLT 150 11/04/2017 1210   MCV 97.5 06/13/2019 1356   MCV 101.9 (H) 11/04/2017 1210   MCH 34.1 (H) 06/13/2019 1356   MCHC 35.0 06/13/2019 1356   RDW 11.4 (L) 06/13/2019 1356   RDW 13.5 11/04/2017 1210   LYMPHSABS 0.8 06/13/2019 1356   LYMPHSABS 1.5 11/04/2017 1210   MONOABS 0.3 06/13/2019 1356   MONOABS 0.9 11/04/2017 1210   EOSABS 0.2 06/13/2019 1356   EOSABS 0.2 11/04/2017 1210   BASOSABS 0.0 06/13/2019 1356   BASOSABS 0.1 11/04/2017 1210   -------------------------------  Imaging from last 24 hours (if applicable):  Radiology interpretation: Ct Chest W Contrast  Result Date: 05/19/2019 CLINICAL DATA:  Restaging non-small cell lung cancer. EXAM: CT CHEST WITH CONTRAST TECHNIQUE: Multidetector CT imaging of the chest was performed during intravenous contrast administration. CONTRAST:  72mL OMNIPAQUE IOHEXOL 300 MG/ML  SOLN COMPARISON:  11/07/2018 FINDINGS: Cardiovascular: The heart is normal in size. No pericardial effusion. Stable slightly prominent pericardial and epicardial fat. Stable mild tortuosity, ectasia and calcification of the thoracic aorta. No dissection. The branch vessels are patent. Stable three-vessel coronary artery calcifications. Mediastinum/Nodes: New and progressive irregular pleural nodularity involving the left hemithorax consistent with recurrent pleural tumor. 11 mm lateral pleural nodule in the left upper lobe region on image number 26 previously measured 5 mm. 8 mm medial pleural nodule on image number 47 at the level of the aortic arch previously measured 4 mm. 9.5 mm medial pleural nodule on image  number 74 previously measured 5.5 mm Nodular lesion adjacent to the esophagus on image number 117 measures approximately 30 x 19 mm and was much smaller on the prior study. 26.5 x 18 mm pleural lesion on image number 103 is also larger. Progressive nodularity along the left major fissure on image number 67. Lungs/Pleura: Underlying stable emphysematous changes. No parenchymal pulmonary lesions. No acute pulmonary findings. Upper Abdomen: No significant upper abdominal findings. Musculoskeletal: No significant bony findings. IMPRESSION: 1. CT findings consistent with recurrent left-sided pleural tumor with several areas of progressive disease as detailed above. 2. No parenchymal lung lesions. 3. Stable atherosclerotic disease involving the aorta and coronary arteries. Aortic Atherosclerosis (ICD10-I70.0) and Emphysema (ICD10-J43.9). Electronically Signed   By: Marijo Sanes M.D.   On: 05/19/2019 12:55        This case was discussed with Dr. Julien Nordmann. He expressed agreement with my management of this patient.

## 2019-06-13 NOTE — Patient Instructions (Signed)

## 2019-06-13 NOTE — Progress Notes (Signed)
Pt had bout of diarrhea today that caused him to miss his flush appt.  Pt was brought to [facility] by NT Lynnae Sandhoff who assisted patient with cleaning himself as pt appeared to have soiled himself in the process.  Pt refused new clothing/underwear offered and stated that he felt fine to continue with the assessment/infusion in his clothing.  Pt given 1L NS IVF, tolerated well.  Reports feeling better at end of infusion.  Able to eat/drink as well.  A&Ox4 currently but per family there is intermittent confusion.  Ambulatory to exit with belongings.  Daughter spoke with PA Lucianne Lei and is on the way to take the pt home.

## 2019-06-14 ENCOUNTER — Telehealth: Payer: Self-pay | Admitting: Medical

## 2019-06-14 NOTE — Telephone Encounter (Signed)
No los per 7/7. °

## 2019-06-19 ENCOUNTER — Other Ambulatory Visit: Payer: Self-pay | Admitting: Internal Medicine

## 2019-06-19 ENCOUNTER — Other Ambulatory Visit: Payer: Self-pay | Admitting: Family Medicine

## 2019-06-19 DIAGNOSIS — E7849 Other hyperlipidemia: Secondary | ICD-10-CM

## 2019-06-22 ENCOUNTER — Encounter: Payer: Self-pay | Admitting: Internal Medicine

## 2019-06-22 ENCOUNTER — Inpatient Hospital Stay (HOSPITAL_BASED_OUTPATIENT_CLINIC_OR_DEPARTMENT_OTHER): Payer: PPO | Admitting: Internal Medicine

## 2019-06-22 ENCOUNTER — Inpatient Hospital Stay: Payer: PPO

## 2019-06-22 ENCOUNTER — Other Ambulatory Visit: Payer: Self-pay

## 2019-06-22 VITALS — BP 143/96 | HR 71 | Temp 98.0°F | Resp 20 | Ht 70.0 in | Wt 201.9 lb

## 2019-06-22 VITALS — BP 148/99 | HR 61

## 2019-06-22 DIAGNOSIS — G3184 Mild cognitive impairment, so stated: Secondary | ICD-10-CM

## 2019-06-22 DIAGNOSIS — Z79899 Other long term (current) drug therapy: Secondary | ICD-10-CM

## 2019-06-22 DIAGNOSIS — Z8249 Family history of ischemic heart disease and other diseases of the circulatory system: Secondary | ICD-10-CM

## 2019-06-22 DIAGNOSIS — Z87891 Personal history of nicotine dependence: Secondary | ICD-10-CM

## 2019-06-22 DIAGNOSIS — R159 Full incontinence of feces: Secondary | ICD-10-CM

## 2019-06-22 DIAGNOSIS — C450 Mesothelioma of pleura: Secondary | ICD-10-CM

## 2019-06-22 DIAGNOSIS — Z7982 Long term (current) use of aspirin: Secondary | ICD-10-CM

## 2019-06-22 DIAGNOSIS — Z791 Long term (current) use of non-steroidal anti-inflammatories (NSAID): Secondary | ICD-10-CM

## 2019-06-22 DIAGNOSIS — E860 Dehydration: Secondary | ICD-10-CM | POA: Diagnosis not present

## 2019-06-22 DIAGNOSIS — Z452 Encounter for adjustment and management of vascular access device: Secondary | ICD-10-CM | POA: Diagnosis not present

## 2019-06-22 DIAGNOSIS — Z85850 Personal history of malignant neoplasm of thyroid: Secondary | ICD-10-CM

## 2019-06-22 DIAGNOSIS — K219 Gastro-esophageal reflux disease without esophagitis: Secondary | ICD-10-CM | POA: Diagnosis not present

## 2019-06-22 DIAGNOSIS — R079 Chest pain, unspecified: Secondary | ICD-10-CM | POA: Diagnosis not present

## 2019-06-22 DIAGNOSIS — E785 Hyperlipidemia, unspecified: Secondary | ICD-10-CM

## 2019-06-22 DIAGNOSIS — E039 Hypothyroidism, unspecified: Secondary | ICD-10-CM

## 2019-06-22 DIAGNOSIS — Z5111 Encounter for antineoplastic chemotherapy: Secondary | ICD-10-CM

## 2019-06-22 LAB — CBC WITH DIFFERENTIAL (CANCER CENTER ONLY)
Abs Immature Granulocytes: 0.09 10*3/uL — ABNORMAL HIGH (ref 0.00–0.07)
Basophils Absolute: 0 10*3/uL (ref 0.0–0.1)
Basophils Relative: 0 %
Eosinophils Absolute: 0 10*3/uL (ref 0.0–0.5)
Eosinophils Relative: 0 %
HCT: 46 % (ref 39.0–52.0)
Hemoglobin: 15.8 g/dL (ref 13.0–17.0)
Immature Granulocytes: 2 %
Lymphocytes Relative: 14 %
Lymphs Abs: 0.7 10*3/uL (ref 0.7–4.0)
MCH: 33.7 pg (ref 26.0–34.0)
MCHC: 34.3 g/dL (ref 30.0–36.0)
MCV: 98.1 fL (ref 80.0–100.0)
Monocytes Absolute: 0.5 10*3/uL (ref 0.1–1.0)
Monocytes Relative: 9 %
Neutro Abs: 4.1 10*3/uL (ref 1.7–7.7)
Neutrophils Relative %: 75 %
Platelet Count: 265 10*3/uL (ref 150–400)
RBC: 4.69 MIL/uL (ref 4.22–5.81)
RDW: 12.2 % (ref 11.5–15.5)
WBC Count: 5.5 10*3/uL (ref 4.0–10.5)
nRBC: 0.4 % — ABNORMAL HIGH (ref 0.0–0.2)

## 2019-06-22 LAB — CMP (CANCER CENTER ONLY)
ALT: 43 U/L (ref 0–44)
AST: 55 U/L — ABNORMAL HIGH (ref 15–41)
Albumin: 3.8 g/dL (ref 3.5–5.0)
Alkaline Phosphatase: 72 U/L (ref 38–126)
Anion gap: 10 (ref 5–15)
BUN: 37 mg/dL — ABNORMAL HIGH (ref 8–23)
CO2: 23 mmol/L (ref 22–32)
Calcium: 9.3 mg/dL (ref 8.9–10.3)
Chloride: 108 mmol/L (ref 98–111)
Creatinine: 1.14 mg/dL (ref 0.61–1.24)
GFR, Est AFR Am: >60 mL/min (ref >60)
GFR, Estimated: 60 mL/min — ABNORMAL LOW (ref >60)
Glucose, Bld: 157 mg/dL — ABNORMAL HIGH (ref 70–99)
Potassium: 4.4 mmol/L (ref 3.5–5.1)
Sodium: 141 mmol/L (ref 135–145)
Total Bilirubin: 0.7 mg/dL (ref 0.3–1.2)
Total Protein: 7 g/dL (ref 6.5–8.1)

## 2019-06-22 LAB — TOTAL PROTEIN, URINE DIPSTICK: Protein, ur: 30 mg/dL — AB

## 2019-06-22 MED ORDER — SODIUM CHLORIDE 0.9 % IV SOLN
452.0000 mg | Freq: Once | INTRAVENOUS | Status: AC
  Administered 2019-06-22: 450 mg via INTRAVENOUS
  Filled 2019-06-22: qty 45

## 2019-06-22 MED ORDER — SODIUM CHLORIDE 0.9% FLUSH
10.0000 mL | INTRAVENOUS | Status: DC | PRN
  Administered 2019-06-22: 10 mL
  Filled 2019-06-22: qty 10

## 2019-06-22 MED ORDER — SODIUM CHLORIDE 0.9 % IV SOLN
470.0000 mg/m2 | Freq: Once | INTRAVENOUS | Status: AC
  Administered 2019-06-22: 15:00:00 1000 mg via INTRAVENOUS
  Filled 2019-06-22: qty 40

## 2019-06-22 MED ORDER — PALONOSETRON HCL INJECTION 0.25 MG/5ML
0.2500 mg | Freq: Once | INTRAVENOUS | Status: AC
  Administered 2019-06-22: 0.25 mg via INTRAVENOUS

## 2019-06-22 MED ORDER — SODIUM CHLORIDE 0.9 % IV SOLN
Freq: Once | INTRAVENOUS | Status: AC
  Administered 2019-06-22: 13:00:00 via INTRAVENOUS
  Filled 2019-06-22: qty 250

## 2019-06-22 MED ORDER — SODIUM CHLORIDE 0.9 % IV SOLN
Freq: Once | INTRAVENOUS | Status: AC
  Administered 2019-06-22: 13:00:00 via INTRAVENOUS
  Filled 2019-06-22: qty 5

## 2019-06-22 MED ORDER — HEPARIN SOD (PORK) LOCK FLUSH 100 UNIT/ML IV SOLN
500.0000 [IU] | Freq: Once | INTRAVENOUS | Status: AC | PRN
  Administered 2019-06-22: 500 [IU]
  Filled 2019-06-22: qty 5

## 2019-06-22 MED ORDER — SODIUM CHLORIDE 0.9 % IV SOLN
15.0000 mg/kg | Freq: Once | INTRAVENOUS | Status: AC
  Administered 2019-06-22: 1400 mg via INTRAVENOUS
  Filled 2019-06-22: qty 48

## 2019-06-22 MED ORDER — PALONOSETRON HCL INJECTION 0.25 MG/5ML
INTRAVENOUS | Status: AC
  Filled 2019-06-22: qty 5

## 2019-06-22 NOTE — Progress Notes (Signed)
[facility] [phone]   [phone]  OFFICE PROGRESS NOTE  [name], MD [place] [address]  DIAGNOSIS: Stage II malignant pleural mesothelioma, epithelioid type involving the left hemithorax diagnosed in March 2018.  PRIOR THERAPY:  1) Status post left VATS with biopsy and wedge resection of the left lower lobe as well as parietal pleurectomy under the care of Dr. Roxan Hockey on 02/15/2017. 2) Palliative systemic chemotherapy with carboplatin for AUC of 5, Alimta 500 MG/M2 and Avastin 15 MG/KG every 3 weeks. Status post 6 cycles.  The patient has been on observation for almost 2 years.  CURRENT THERAPY: Resuming systemic chemotherapy with carboplatin for AUC of 5, Alimta 500 mg/M2 and Avastin 15 mg/KG every 3 weeks.  First dose May 31, 2019  INTERVAL HISTORY: Dean Pruitt 85 y.o. male returns to the clinic today for follow-up visit.  The patient tolerated the first cycle of his treatment with carboplatin, Alimta and Avastin fairly well.  He denied having any significant fatigue or weakness.  He denied having any nausea, vomiting, diarrhea or constipation.  He has no significant weight loss or night sweats.  He has no headache or visual changes.  He is here today for evaluation before starting cycle #2.  MEDICAL HISTORY: Past Medical History:  Diagnosis Date   Cataract    Depression    situational   GERD (gastroesophageal reflux disease)    Goals of care, counseling/discussion 03/20/2017   Hyperlipidemia    Hypothyroidism    Reactive depression 03/20/2017   Thyroid cancer ([place])    PMH of; on supressive therapy    ALLERGIES:  is allergic to ativan [lorazepam]; fentanyl; and tramadol.  MEDICATIONS:  Current Outpatient Medications  Medication Sig Dispense Refill   aspirin 81 MG tablet Take 81 mg by mouth daily.     bifidobacterium infantis (ALIGN) capsule Take by mouth.     dexamethasone (DECADRON)  4 MG tablet 1 tablet p.o. twice daily the day before, day of and day after chemotherapy every 3 weeks. 40 tablet 1   donepezil (ARICEPT) 10 MG tablet TAKE 1 TABLET BY MOUTH AT BEDTIME 90 tablet 3   escitalopram (LEXAPRO) 20 MG tablet TAKE 1 TABLET BY MOUTH ONCE DAILY AT BEDTIME 90 tablet 3   folic acid (FOLVITE) 1 MG tablet Take 1 tablet (1 mg total) by mouth daily. 30 tablet 2   gabapentin (NEURONTIN) 300 MG capsule TAKE 1 CAPSULE BY MOUTH AT NIGHT -- Office visit needed for further refills 90 capsule 0   levothyroxine (SYNTHROID, LEVOTHROID) 150 MCG tablet TAKE 1 TABLET BY MOUTH ONCE DAILY 90 tablet 1   lidocaine-prilocaine (EMLA) cream Apply 1 application topically as needed. 30 g 0   meloxicam (MOBIC) 7.5 MG tablet Take 1 tablet by mouth once daily 90 tablet 0   omeprazole (PRILOSEC) 20 MG capsule Take 1 capsule (20 mg total) by mouth daily. 90 capsule 1   prochlorperazine (COMPAZINE) 10 MG tablet Take 1 tablet (10 mg total) by mouth every 6 (six) hours as needed for nausea or vomiting. 30 tablet 0   rosuvastatin (CRESTOR) 20 MG tablet Take 1 tablet by mouth once daily 90 tablet 0   No current facility-administered medications for this visit.     SURGICAL HISTORY:  Past Surgical History:  Procedure Laterality Date   CATARACT EXTRACTION, BILATERAL  2013 & 2014   Dr Celene Squibb   colon polyps  2002 & 2005  negative 2008; Dr Earlean Shawl   COLONOSCOPY W/ POLYPECTOMY     LUNG BIOPSY Left 02/15/2017   Procedure: LUNG BIOPSY;  Surgeon: Melrose Nakayama, MD;  Location: [facility];  Service: Thoracic;  Laterality: Left;   PLEURAL EFFUSION DRAINAGE Left 02/15/2017   Procedure: DRAINAGE OF PLEURAL EFFUSION;  Surgeon: Melrose Nakayama, MD;  Location: [facility];  Service: Thoracic;  Laterality: Left;   PORTACATH PLACEMENT N/A 03/25/2017   Procedure: INSERTION PORT-A-CATH, right internal jugular;  Surgeon: Melrose Nakayama, MD;  Location: [facility];  Service: Thoracic;  Laterality: N/A;    THYROIDECTOMY  2001   S/P RAI   VIDEO ASSISTED THORACOSCOPY Left 02/15/2017   Procedure: VIDEO ASSISTED THORACOSCOPY;  Surgeon: Melrose Nakayama, MD;  [facility];  Service: Thoracic;  Laterality: Left;    REVIEW OF SYSTEMS:  A comprehensive review of systems was negative except for: Constitutional: positive for fatigue   PHYSICAL EXAMINATION: General appearance: alert, cooperative, fatigued and no distress Head: Normocephalic, without obvious abnormality, atraumatic Neck: no adenopathy, no JVD, supple, symmetrical, trachea midline and thyroid not enlarged, symmetric, no tenderness/mass/nodules Lymph nodes: Cervical, supraclavicular, and axillary nodes normal. Resp: clear to auscultation bilaterally Back: symmetric, no curvature. ROM normal. No CVA tenderness. Cardio: regular rate and rhythm, S1, S2 normal, no murmur, click, rub or gallop GI: soft, non-tender; bowel sounds normal; no masses,  no organomegaly Extremities: extremities normal, atraumatic, no cyanosis or edema  ECOG PERFORMANCE STATUS: 1 - Symptomatic but completely ambulatory  Blood pressure (!) 143/96, pulse 71, temperature 98 F (36.7 C), temperature source Oral, resp. rate 20, height 5\' 10"  (1.778 m), weight 201 lb 14.4 oz (91.6 kg), SpO2 95 %.  LABORATORY DATA: Lab Results  Component Value Date   WBC 5.5 06/22/2019   HGB 15.8 06/22/2019   HCT 46.0 06/22/2019   MCV 98.1 06/22/2019   PLT 265 06/22/2019      Chemistry      Component Value Date/Time   NA 139 06/13/2019 1356   NA 140 11/04/2017 1210   K 4.0 06/13/2019 1356   K 4.1 11/04/2017 1210   CL 107 06/13/2019 1356   CO2 22 06/13/2019 1356   CO2 26 11/04/2017 1210   BUN 17 06/13/2019 1356   BUN 20.1 11/04/2017 1210   CREATININE 0.98 06/13/2019 1356   CREATININE 1.2 11/04/2017 1210      Component Value Date/Time   CALCIUM 8.2 (L) 06/13/2019 1356   CALCIUM 9.6 11/04/2017 1210   ALKPHOS 68 06/13/2019 1356   ALKPHOS 62 11/04/2017 1210   AST  81 (H) 06/13/2019 1356   AST 37 (H) 11/04/2017 1210   ALT 59 (H) 06/13/2019 1356   ALT 22 11/04/2017 1210   BILITOT 1.0 06/13/2019 1356   BILITOT 1.32 (H) 11/04/2017 1210       RADIOGRAPHIC STUDIES: No results found.  ASSESSMENT AND PLAN:  This is a very pleasant 85 years old white male with malignant pleural mesothelioma involving the left hemothorax. He completed treatment with systemic chemotherapy with carboplatin, Alimta and Avastin status post 6 cycles. He has been tolerating the treatment fairly well with no significant adverse effects except for fatigue. The patient has been in observation for almost 2 years. Repeat the scan and June 2020 showed evidence for recurrent disease in the left side of the chest with several areas of progression compared to the previous imaging studies. The patient was a started on systemic chemotherapy again with carboplatin for AUC of 5, Alimta 500 mg/M2 and  Avastin 15 mg/KG every 3 weeks.  He status post 1 cycle.  He tolerated the first cycle of his treatment well and he has improvement of the pain in the left side of the chest. I recommended for the patient to proceed with cycle #2 today as planned. He will come back for follow-up visit in 3 weeks for evaluation before the next cycle of his treatment. The patient was advised to call immediately if he has any concerning symptoms in the interval. The patient voices understanding of current disease status and treatment options and is in agreement with the current care plan. All questions were answered. The patient knows to call the clinic with any problems, questions or concerns. We can certainly see the patient much sooner if necessary.  Disclaimer: This note was dictated with voice recognition software. Similar sounding words can inadvertently be transcribed and may not be corrected upon review.

## 2019-06-22 NOTE — Patient Instructions (Signed)
[facility] Discharge Instructions for Patients Receiving Chemotherapy  Today you received the following chemotherapy agents: Bevacizumab (Avastin), Pemetrexed (Alimta), and Carboplatin (Paraplatin)  To help prevent nausea and vomiting after your treatment, we encourage you to take your nausea medication as directed.   If you develop nausea and vomiting that is not controlled by your nausea medication, call the clinic.   BELOW ARE SYMPTOMS THAT SHOULD BE REPORTED IMMEDIATELY:  *FEVER GREATER THAN 100.5 F  *CHILLS WITH OR WITHOUT FEVER  NAUSEA AND VOMITING THAT IS NOT CONTROLLED WITH YOUR NAUSEA MEDICATION  *UNUSUAL SHORTNESS OF BREATH  *UNUSUAL BRUISING OR BLEEDING  TENDERNESS IN MOUTH AND THROAT WITH OR WITHOUT PRESENCE OF ULCERS  *URINARY PROBLEMS  *BOWEL PROBLEMS  UNUSUAL RASH Items with * indicate a potential emergency and should be followed up as soon as possible.  Feel free to call the clinic should you have any questions or concerns. The clinic phone number is [phone].  Please show the [facility] at check-in to the Emergency Department and triage nurse.  Coronavirus (COVID-19) Are you at risk?  Are you at risk for the Coronavirus (COVID-19)?  To be considered HIGH RISK for Coronavirus (COVID-19), you have to meet the following criteria:  . Traveled to [place], [place], [place], [place] or [place]; or in the [place] to [place], [place], [place], or [place]; and have fever, cough, and shortness of breath within the last 2 weeks of travel OR . Been in close contact with a person diagnosed with COVID-19 within the last 2 weeks and have fever, cough, and shortness of breath . IF YOU DO NOT MEET THESE CRITERIA, YOU ARE CONSIDERED LOW RISK FOR COVID-19.  What to do if you are HIGH RISK for COVID-19?  Marland Kitchen If you are having a medical emergency, call 911. . Seek medical care right away. Before you go to a doctor's office, urgent care or  emergency department, call ahead and tell them about your recent travel, contact with someone diagnosed with COVID-19, and your symptoms. You should receive instructions from your physician's office regarding next steps of care.  . When you arrive at healthcare provider, tell the healthcare staff immediately you have returned from visiting [place], [place], [place], [place] or [place]; or traveled in the [place] to [place], [place], [place], or [place]; in the last two weeks or you have been in close contact with a person diagnosed with COVID-19 in the last 2 weeks.   . Tell the health care staff about your symptoms: fever, cough and shortness of breath. . After you have been seen by a medical provider, you will be either: o Tested for (COVID-19) and discharged home on quarantine except to seek medical care if symptoms worsen, and asked to  - Stay home and avoid contact with others until you get your results (4-5 days)  - Avoid travel on public transportation if possible (such as bus, train, or airplane) or o Sent to the Emergency Department by EMS for evaluation, COVID-19 testing, and possible admission depending on your condition and test results.  What to do if you are LOW RISK for COVID-19?  Reduce your risk of any infection by using the same precautions used for avoiding the common cold or flu:  Marland Kitchen Wash your hands often with soap and warm water for at least 20 seconds.  If soap and water are not readily available, use an alcohol-based hand sanitizer with at least 60% alcohol.  Marland Kitchen  If coughing or sneezing, cover your mouth and nose by coughing or sneezing into the elbow areas of your shirt or coat, into a tissue or into your sleeve (not your hands). . Avoid shaking hands with others and consider head nods or verbal greetings only. . Avoid touching your eyes, nose, or mouth with unwashed hands.  . Avoid close contact with people who are sick. . Avoid places or events with large numbers  of people in one location, like concerts or sporting events. . Carefully consider travel plans you have or are making. . If you are planning any travel outside or inside the [place], visit the CDC's Travelers' Health webpage for the latest health notices. . If you have some symptoms but not all symptoms, continue to monitor at home and seek medical attention if your symptoms worsen. . If you are having a medical emergency, call 911.   [place] / e-Visit: [email or web address]         [facility]: [facility]: [phone]                   [facility]: [phone]

## 2019-06-23 ENCOUNTER — Telehealth: Payer: Self-pay | Admitting: Internal Medicine

## 2019-06-23 NOTE — Telephone Encounter (Signed)
Scheduled appt per 7/16 los - pt to get an  Updated schedule next visit.

## 2019-06-26 ENCOUNTER — Telehealth: Payer: Self-pay | Admitting: Internal Medicine

## 2019-06-26 NOTE — Telephone Encounter (Signed)
Left message - unable to reach pt . Called and left message per 7/16 los.  Next appt 7/23 lab only at 2 pm /

## 2019-06-28 ENCOUNTER — Telehealth: Payer: Self-pay | Admitting: Medical Oncology

## 2019-06-28 NOTE — Telephone Encounter (Signed)
8 days post chemo-Weakness /dizziness/increased confusion- He fell 3 times yesterday . He ate a cantelope today and a bowl of soup. He drank only a cup of coffee and 4 oz water.  Instructed dtr to increase his fluid intake with soups., popsicles, smoothies, Gatorade, whatever pt will drink. Pt has labs tomorrow and Dean Pruitt will evaluate labs before sending pt home. Dtr informed.

## 2019-06-29 ENCOUNTER — Inpatient Hospital Stay (HOSPITAL_BASED_OUTPATIENT_CLINIC_OR_DEPARTMENT_OTHER): Payer: PPO | Admitting: Medical

## 2019-06-29 ENCOUNTER — Ambulatory Visit: Payer: PPO

## 2019-06-29 ENCOUNTER — Inpatient Hospital Stay: Payer: PPO

## 2019-06-29 ENCOUNTER — Telehealth: Payer: Self-pay | Admitting: Medical Oncology

## 2019-06-29 ENCOUNTER — Other Ambulatory Visit: Payer: Self-pay

## 2019-06-29 VITALS — BP 115/79 | HR 68

## 2019-06-29 DIAGNOSIS — E785 Hyperlipidemia, unspecified: Secondary | ICD-10-CM

## 2019-06-29 DIAGNOSIS — Z791 Long term (current) use of non-steroidal anti-inflammatories (NSAID): Secondary | ICD-10-CM

## 2019-06-29 DIAGNOSIS — Z85850 Personal history of malignant neoplasm of thyroid: Secondary | ICD-10-CM

## 2019-06-29 DIAGNOSIS — G3184 Mild cognitive impairment, so stated: Secondary | ICD-10-CM

## 2019-06-29 DIAGNOSIS — Z452 Encounter for adjustment and management of vascular access device: Secondary | ICD-10-CM | POA: Diagnosis not present

## 2019-06-29 DIAGNOSIS — Z79899 Other long term (current) drug therapy: Secondary | ICD-10-CM

## 2019-06-29 DIAGNOSIS — E860 Dehydration: Secondary | ICD-10-CM

## 2019-06-29 DIAGNOSIS — Z87891 Personal history of nicotine dependence: Secondary | ICD-10-CM

## 2019-06-29 DIAGNOSIS — I959 Hypotension, unspecified: Secondary | ICD-10-CM

## 2019-06-29 DIAGNOSIS — B309 Viral conjunctivitis, unspecified: Secondary | ICD-10-CM | POA: Diagnosis not present

## 2019-06-29 DIAGNOSIS — R079 Chest pain, unspecified: Secondary | ICD-10-CM | POA: Diagnosis not present

## 2019-06-29 DIAGNOSIS — E039 Hypothyroidism, unspecified: Secondary | ICD-10-CM | POA: Diagnosis not present

## 2019-06-29 DIAGNOSIS — C450 Mesothelioma of pleura: Secondary | ICD-10-CM

## 2019-06-29 DIAGNOSIS — R159 Full incontinence of feces: Secondary | ICD-10-CM

## 2019-06-29 DIAGNOSIS — Z7982 Long term (current) use of aspirin: Secondary | ICD-10-CM

## 2019-06-29 DIAGNOSIS — K219 Gastro-esophageal reflux disease without esophagitis: Secondary | ICD-10-CM | POA: Diagnosis not present

## 2019-06-29 DIAGNOSIS — Z8249 Family history of ischemic heart disease and other diseases of the circulatory system: Secondary | ICD-10-CM

## 2019-06-29 DIAGNOSIS — I951 Orthostatic hypotension: Secondary | ICD-10-CM | POA: Diagnosis not present

## 2019-06-29 DIAGNOSIS — Z95828 Presence of other vascular implants and grafts: Secondary | ICD-10-CM

## 2019-06-29 LAB — CMP (CANCER CENTER ONLY)
ALT: 24 U/L (ref 0–44)
AST: 28 U/L (ref 15–41)
Albumin: 3.3 g/dL — ABNORMAL LOW (ref 3.5–5.0)
Alkaline Phosphatase: 77 U/L (ref 38–126)
Anion gap: 11 (ref 5–15)
BUN: 29 mg/dL — ABNORMAL HIGH (ref 8–23)
CO2: 26 mmol/L (ref 22–32)
Calcium: 9.1 mg/dL (ref 8.9–10.3)
Chloride: 100 mmol/L (ref 98–111)
Creatinine: 1.11 mg/dL (ref 0.61–1.24)
GFR, Est AFR Am: >60 mL/min (ref >60)
GFR, Estimated: >60 mL/min (ref >60)
Glucose, Bld: 123 mg/dL — ABNORMAL HIGH (ref 70–99)
Potassium: 3.6 mmol/L (ref 3.5–5.1)
Sodium: 137 mmol/L (ref 135–145)
Total Bilirubin: 1.6 mg/dL — ABNORMAL HIGH (ref 0.3–1.2)
Total Protein: 6.6 g/dL (ref 6.5–8.1)

## 2019-06-29 LAB — CBC WITH DIFFERENTIAL (CANCER CENTER ONLY)
Abs Immature Granulocytes: 0.02 10*3/uL (ref 0.00–0.07)
Basophils Absolute: 0 10*3/uL (ref 0.0–0.1)
Basophils Relative: 0 %
Eosinophils Absolute: 0.1 10*3/uL (ref 0.0–0.5)
Eosinophils Relative: 2 %
HCT: 44.6 % (ref 39.0–52.0)
Hemoglobin: 15.7 g/dL (ref 13.0–17.0)
Immature Granulocytes: 0 %
Lymphocytes Relative: 30 %
Lymphs Abs: 1.4 10*3/uL (ref 0.7–4.0)
MCH: 33.6 pg (ref 26.0–34.0)
MCHC: 35.2 g/dL (ref 30.0–36.0)
MCV: 95.5 fL (ref 80.0–100.0)
Monocytes Absolute: 0.3 10*3/uL (ref 0.1–1.0)
Monocytes Relative: 7 %
Neutro Abs: 2.9 10*3/uL (ref 1.7–7.7)
Neutrophils Relative %: 61 %
Platelet Count: 80 10*3/uL — ABNORMAL LOW (ref 150–400)
RBC: 4.67 MIL/uL (ref 4.22–5.81)
RDW: 12 % (ref 11.5–15.5)
WBC Count: 4.7 10*3/uL (ref 4.0–10.5)
nRBC: 0 % (ref 0.0–0.2)

## 2019-06-29 MED ORDER — SODIUM CHLORIDE 0.9% FLUSH
10.0000 mL | Freq: Once | INTRAVENOUS | Status: AC
  Administered 2019-06-29: 17:00:00 10 mL
  Filled 2019-06-29: qty 10

## 2019-06-29 MED ORDER — HEPARIN SOD (PORK) LOCK FLUSH 100 UNIT/ML IV SOLN
500.0000 [IU] | Freq: Once | INTRAVENOUS | Status: AC
  Administered 2019-06-29: 17:00:00 500 [IU]
  Filled 2019-06-29: qty 5

## 2019-06-29 MED ORDER — POLYMYXIN B-TRIMETHOPRIM 10000-0.1 UNIT/ML-% OP SOLN: 2.0000 [drp] | Freq: Four times a day (QID) | OPHTHALMIC | 1 refills | Status: AC

## 2019-06-29 MED ORDER — SODIUM CHLORIDE 0.9 % IV SOLN
Freq: Once | INTRAVENOUS | Status: AC
  Administered 2019-06-29: 15:00:00 via INTRAVENOUS
  Filled 2019-06-29: qty 250

## 2019-06-29 NOTE — Telephone Encounter (Signed)
Pt orthostatic and has drainage coming from his eyes .Per Julien Nordmann pt need [facility] appt. and IVF.

## 2019-06-29 NOTE — Progress Notes (Signed)
Pt is orthostatic, reports fatigue and dizziness/weakness.  Baseline dementia.  Denies CP, SOB, N/V/D, fever, chills, or rash.  Bilat redness, tearing, mucous dried/crusted buildup and drainage in eyes.  Denies pain or burning in eyes, just intermittent itching.  Pt received 1 L IVF NS for Orthostatic hotn.  Reports feeling a bit better at end of tx.  VS stabilized.  Tolerated well.  Ate and drank during infusion.  Daughter Cecille Rubin contacted by [employer].

## 2019-06-29 NOTE — Patient Instructions (Signed)

## 2019-06-30 ENCOUNTER — Other Ambulatory Visit: Payer: Self-pay

## 2019-06-30 ENCOUNTER — Inpatient Hospital Stay: Payer: PPO

## 2019-06-30 VITALS — BP 137/76 | HR 58 | Temp 98.9°F | Resp 18 | Ht 70.0 in | Wt 199.7 lb

## 2019-06-30 DIAGNOSIS — Z95828 Presence of other vascular implants and grafts: Secondary | ICD-10-CM

## 2019-06-30 DIAGNOSIS — C450 Mesothelioma of pleura: Secondary | ICD-10-CM

## 2019-06-30 DIAGNOSIS — Z452 Encounter for adjustment and management of vascular access device: Secondary | ICD-10-CM | POA: Diagnosis not present

## 2019-06-30 DIAGNOSIS — E860 Dehydration: Secondary | ICD-10-CM

## 2019-06-30 MED ORDER — SODIUM CHLORIDE 0.9 % IV SOLN
Freq: Once | INTRAVENOUS | Status: AC
  Administered 2019-06-30: 14:00:00 via INTRAVENOUS
  Filled 2019-06-30: qty 250

## 2019-06-30 MED ORDER — SODIUM CHLORIDE 0.9% FLUSH
10.0000 mL | Freq: Once | INTRAVENOUS | Status: AC
  Administered 2019-06-30: 16:00:00 10 mL
  Filled 2019-06-30: qty 10

## 2019-06-30 MED ORDER — HEPARIN SOD (PORK) LOCK FLUSH 100 UNIT/ML IV SOLN
500.0000 [IU] | Freq: Once | INTRAVENOUS | Status: AC
  Administered 2019-06-30: 500 [IU]
  Filled 2019-06-30: qty 5

## 2019-06-30 NOTE — Progress Notes (Signed)
[facility] Progress Note   Dean Pruitt [id number] 05/10/1934 85 y.o.  Billy Turvey Meno is managed by Dr. Fanny Bien. [name]  Actively treated with chemotherapy/immunotherapy/hormonal therapy: yes  Current therapy: Carboplatin, Avastin, and Alimta  Last treated: 06/22/2019 (cycle 2, day 1)  Next scheduled appointment with provider: 07/13/2019  Assessment: Plan:    Hypotension, unspecified hypotension type - Plan: 0.9 %  sodium chloride infusion  Port catheter in place - Plan: heparin lock flush 100 unit/mL, sodium chloride flush (NS) 0.9 % injection 10 mL  Malignant mesothelioma of pleura (HCC) - Plan: heparin lock flush 100 unit/mL, sodium chloride flush (NS) 0.9 % injection 10 mL  Acute viral conjunctivitis of both eyes   Malignant mesothelioma of the pleura: Mr. Delay continues to be followed by Dr. Julien Nordmann.  He is status post cycle 2, day 1 of carboplatin, Avastin and Alimta.  He is scheduled to be seen again on 07/13/2019.  Hypotension: Patient's blood pressure returned at 71/52 when orthostatic blood pressures were taken today.  The patient is not a good historian.  I had to talk to his daughter who reports that he has not been eating or drinking well.  He was given 1 L of normal saline today.  His labs returned showing evidence of dehydration.  He will return tomorrow for another 1 liter of normal saline.  He was able to drink a Coke and eat tomato soup while he was here today.  He was encouraged to push fluids.  I stressed this to his daughter also.  Viral conjunctivitis: The patient has been having clear drainage from his eyes.  He was given a prescription for Polytrim ophthalmic drops.  I instructed his daughter to continue these for 3 days past improvement of his symptoms.  Please see After Visit Summary for patient specific instructions.  Future Appointments  Date Time Provider [place]  06/30/2019  2:00 PM SYMPTOM MANAGEMENT  CLINIC 2 [facility] None  07/05/2019  8:50 AM Tomi Likens, Adam R, DO [facility] None  07/06/2019  2:00 PM [facility] LAB 2 [facility] None  07/13/2019 11:30 AM [facility] LAB 4 [facility] None  07/13/2019 12:00 PM Curt Bears, MD [facility] None  07/13/2019  1:00 PM [facility] INFUSION [facility] None  07/18/2019  1:30 PM [name], MD [facility]  07/20/2019  2:00 PM [facility] LAB 2 [facility] None  07/27/2019  2:15 PM [facility] LAB 3 [facility] None  08/03/2019 12:15 PM [facility] LAB 3 [facility] None  08/03/2019  1:00 PM Heilingoetter, Cassandra L, PA-C [facility] None  08/03/2019  2:00 PM [facility] INFUSION [facility] None  08/10/2019  2:00 PM [facility] LAB 6 [facility] None  08/17/2019  2:00 PM [facility] LAB 3 [facility] None  08/24/2019  9:15 AM [facility] LAB 2 [facility] None  08/24/2019  9:45 AM Curt Bears, MD [facility] None  08/24/2019 11:00 AM [facility] INFUSION [facility] None  09/14/2019 11:45 AM [facility] LAB 2 [facility] None  09/14/2019 12:15 PM Curt Bears, MD [facility] None  09/14/2019  1:00 PM [facility] INFUSION [facility] None    No orders of the defined types were placed in this encounter.      Subjective:   Patient ID:  Dean Pruitt is a 85 y.o. (DOB October 29, 1934) male.  Chief Complaint:  Chief Complaint  Patient presents with   Hypotension    HPI Dean Pruitt is an 85 year old male with a history of a malignant mesothelioma of the pleura.  He is followed by Dr. Julien Nordmann and is status post  cycle 2, day 1 of carboplatin, Avastin and Alimta.  He presents to the office today after his daughter reported that he had 3 falls on Wednesday and is not eating or drinking a lot.  According to her he had no more than 1 cup of coffee and 4 ounces of water yesterday.  He did eat some cantaloupe and a bowl of soup.  He has a history of cognitive impairment and is a poor historian.  He reports today that he has had nothing to  eat since yesterday.  He also reports that he is eating and drinking well.  He does not recall falling yesterday.   Medications: I have reviewed the patient's current medications.  Allergies:  Allergies  Allergen Reactions   Ativan [Lorazepam] Other (See Comments)    Mental status change-"went crazy in the head"   Fentanyl Other (See Comments)    hallucinations   Tramadol Other (See Comments)    Altered mental status "crazy in the head'    Past Medical History:  Diagnosis Date   Cataract    Depression    situational   GERD (gastroesophageal reflux disease)    Goals of care, counseling/discussion 03/20/2017   Hyperlipidemia    Hypothyroidism    Reactive depression 03/20/2017   Thyroid cancer [facility])    PMH of; on supressive therapy    Past Surgical History:  Procedure Laterality Date   CATARACT EXTRACTION, BILATERAL  2013 & 2014   Dr Celene Squibb   colon polyps  2002 & 2005   negative 2008; Dr Earlean Shawl   COLONOSCOPY W/ POLYPECTOMY     LUNG BIOPSY Left 02/15/2017   Procedure: LUNG BIOPSY;  Surgeon: Melrose Nakayama, MD;  Location: [place];  Service: Thoracic;  Laterality: Left;   PLEURAL EFFUSION DRAINAGE Left 02/15/2017   Procedure: DRAINAGE OF PLEURAL EFFUSION;  Surgeon: Melrose Nakayama, MD;  Location: [place];  Service: Thoracic;  Laterality: Left;   PORTACATH PLACEMENT N/A 03/25/2017   Procedure: INSERTION PORT-A-CATH, right internal jugular;  Surgeon: Melrose Nakayama, MD;  Location: [place];  Service: Thoracic;  Laterality: N/A;   THYROIDECTOMY  2001   S/P RAI   VIDEO ASSISTED THORACOSCOPY Left 02/15/2017   Procedure: VIDEO ASSISTED THORACOSCOPY;  Surgeon: Melrose Nakayama, MD;  Location: [place];  Service: Thoracic;  Laterality: Left;    Family History  Problem Relation Age of Onset   Alzheimer's disease Mother    Hypertension Mother    Heart disease Father        CAD and angioplasty in 70s   Cancer Father        Bladder   Other Brother         valvular heart disease   Diabetes Neg Hx    Stroke Neg Hx     Social History   Socioeconomic History   Marital status: Married    Spouse name: Not on file   Number of children: 2   Years of education: Not on file   Highest education level: Not on file  Occupational History   Occupation: Retired  Scientist, product/process development strain: Not on file   Food insecurity    Worry: Not on file    Inability: Not on Lexicographer needs    Medical: Not on file    Non-medical: Not on file  Tobacco Use   Smoking status: Former Smoker    Packs/day: 1.00    Years: 5.00    Pack years: 5.00  Types: Cigarettes    Quit date: 12/07/1958    Years since quitting: 60.6   Smokeless tobacco: Never Used   Tobacco comment: smoked 1956-1960, up 1 ppd  Substance and Sexual Activity   Alcohol use: Yes    Alcohol/week: 14.0 standard drinks    Types: 14 Glasses of wine per week   Drug use: No   Sexual activity: Not on file  Lifestyle   Physical activity    Days per week: Not on file    Minutes per session: Not on file   Stress: Not on file  Relationships   Social connections    Talks on phone: Not on file    Gets together: Not on file    Attends religious service: Not on file    Active member of club or organization: Not on file    Attends meetings of clubs or organizations: Not on file    Relationship status: Not on file   Intimate partner violence    Fear of current or ex partner: Not on file    Emotionally abused: Not on file    Physically abused: Not on file    Forced sexual activity: Not on file  Other Topics Concern   Not on file  Social History Narrative   Exercise:  Yard work, walks on occasion                Past Medical History, Surgical history, Social history, and Family history were reviewed and updated as appropriate.   Please see review of systems for further details on the patient's review from today.   Review of Systems:   Review of Systems  Constitutional: Positive for appetite change. Negative for chills, diaphoresis and fever.  HENT: Positive for voice change (The patient's daughter reports that he has been losing his voice.). Negative for trouble swallowing.   Eyes: Positive for discharge. Negative for pain, redness and itching.  Respiratory: Negative for cough, chest tightness, shortness of breath and wheezing.   Cardiovascular: Negative for chest pain and palpitations.  Gastrointestinal: Negative for abdominal pain, constipation, diarrhea, nausea and vomiting.  Musculoskeletal: Negative for back pain and myalgias.  Neurological: Negative for dizziness, light-headedness and headaches.       3 falls at home yesterday  Psychiatric/Behavioral: Positive for confusion.    Objective:   Physical Exam:  BP 115/79    Pulse 68  ECOG: 1  Physical Exam Constitutional:      General: He is not in acute distress.    Appearance: He is not diaphoretic.  HENT:     Head: Normocephalic and atraumatic.  Eyes:     General: No scleral icterus.       Right eye: Discharge present.        Left eye: Discharge present.    Conjunctiva/sclera: Conjunctivae normal.  Cardiovascular:     Rate and Rhythm: Normal rate and regular rhythm.     Heart sounds: Normal heart sounds. No murmur. No friction rub. No gallop.   Pulmonary:     Effort: Pulmonary effort is normal. No respiratory distress.     Breath sounds: Normal breath sounds. No wheezing or rales.  Skin:    General: Skin is warm and dry.     Findings: No erythema or rash.  Psychiatric:        Mood and Affect: Mood normal.        Speech: Speech normal.        Cognition and Memory: Cognition is impaired. Memory is  impaired.     Lab Review:     Component Value Date/Time   NA 137 06/29/2019 1406   NA 140 11/04/2017 1210   K 3.6 06/29/2019 1406   K 4.1 11/04/2017 1210   CL 100 06/29/2019 1406   CO2 26 06/29/2019 1406   CO2 26 11/04/2017 1210   GLUCOSE 123  (H) 06/29/2019 1406   GLUCOSE 115 11/04/2017 1210   BUN 29 (H) 06/29/2019 1406   BUN 20.1 11/04/2017 1210   CREATININE 1.11 06/29/2019 1406   CREATININE 1.2 11/04/2017 1210   CALCIUM 9.1 06/29/2019 1406   CALCIUM 9.6 11/04/2017 1210   PROT 6.6 06/29/2019 1406   PROT 7.4 11/04/2017 1210   ALBUMIN 3.3 (L) 06/29/2019 1406   ALBUMIN 4.1 11/04/2017 1210   AST 28 06/29/2019 1406   AST 37 (H) 11/04/2017 1210   ALT 24 06/29/2019 1406   ALT 22 11/04/2017 1210   ALKPHOS 77 06/29/2019 1406   ALKPHOS 62 11/04/2017 1210   BILITOT 1.6 (H) 06/29/2019 1406   BILITOT 1.32 (H) 11/04/2017 1210   GFRNONAA >60 06/29/2019 1406   GFRAA >60 06/29/2019 1406       Component Value Date/Time   WBC 4.7 06/29/2019 1406   WBC 4.7 01/17/2019 1405   RBC 4.67 06/29/2019 1406   HGB 15.7 06/29/2019 1406   HGB 15.7 11/04/2017 1210   HCT 44.6 06/29/2019 1406   HCT 47.2 11/04/2017 1210   PLT 80 (L) 06/29/2019 1406   PLT 150 11/04/2017 1210   MCV 95.5 06/29/2019 1406   MCV 101.9 (H) 11/04/2017 1210   MCH 33.6 06/29/2019 1406   MCHC 35.2 06/29/2019 1406   RDW 12.0 06/29/2019 1406   RDW 13.5 11/04/2017 1210   LYMPHSABS 1.4 06/29/2019 1406   LYMPHSABS 1.5 11/04/2017 1210   MONOABS 0.3 06/29/2019 1406   MONOABS 0.9 11/04/2017 1210   EOSABS 0.1 06/29/2019 1406   EOSABS 0.2 11/04/2017 1210   BASOSABS 0.0 06/29/2019 1406   BASOSABS 0.1 11/04/2017 1210   -------------------------------  Imaging from last 24 hours (if applicable):  Radiology interpretation: No results found.      This case was discussed with Dr. Julien Nordmann. He expressed agreement with my management of this patient.

## 2019-06-30 NOTE — Patient Instructions (Signed)

## 2019-07-03 ENCOUNTER — Ambulatory Visit: Payer: PPO | Admitting: Neurology

## 2019-07-04 NOTE — Progress Notes (Signed)
NEUROLOGY FOLLOW UP OFFICE NOTE  [place] [id number]  HISTORY OF PRESENT ILLNESS: Dean Pruitt is an 85 year old right-handed male with hyperlipidemia, elevated blood pressure and history of thyroid cancer s/p thyroidectomy who follows up for vascular dementia.  He is accompanied by his daughter who supplements history.  UPDATE: He is taking Aricept 49m at bedtime, ASA 842m Crestor  He has restarted chemo for mesothelioma about 6 weeks ago.  He seems to have increased confusion since starting chemotherapy.  He has also been dehydrated, which aggravates it.  No hallucinations.  Denies depression.  Appetite and sleep good.  He is able to bathe and dress self and use toilet independently.  His wife manages his medications, cooks and handles the finances.  He does not drive.    HISTORY: His wife started noticing problems with memory in 2016.  He recognizes memory problems too, but is not as concerned as his wife.  He started to become disoriented driving on familiar routes.  From church, he would be unsure how to get to known restaurants.  He had trouble driving to his daughter's house, as well.  Once in a while, hew would misplace things, such as his checkbook.  Over the span of a few months, he forgot to pay some pills three times.  He has since set up auto-payments.  This past year, he began to struggle with paying his taxes.  He sets up his pillbox for the week every week, which works out well.  He once saw a close friend from church whom he hasn't seen for several years.  He recognized him but couldn't remember his name.  Sleeping is poor.  He drinks 2 glasses of wine a day.   He underwent neuropsychological testing on 04/14/16.  Results were overall within normal limits, however there was evidence of mild executive dysfunction, as demonstrated with abnormalities in mental flexibility and set shifting, verbal fluency and clock drawing.  Although stress was likely the primary  cause of his memory problems, these findings also suggest an underlying non-amnestic mild cognitive impairment.  He underwent repeat neuropsychological testing on 03/01/18.  He demonstrated interval decline in multiple domains of cognitive functioning.  As he has difficulty managing finances and medications independently, he has met criteria for mild vascular dementia.    He had been receiving B12 supplementation.  He was diagnosed with malignant mesothelioma in March 2018 and had been undergoing chemotherapy.  This past Ap16-Apr-2024his grandson passed away.  Over the past year, he has had cognitive decline with confusion and recurrent falls.  He has had difficulty using common devices, such as the telephone and TV remote.  His family manages his finances and his medications.  He rarely drives.  He is able to dress, bathe and use the toilet himself.  His daughter stays with him and his wife.  He is unaware that his wife with ovarian cancer will be transferred to hospice  MRI of brain with and without contrast from 07/19/17 revealed moderate chronic small vessel disease but no evidence of metastasis or stroke. Recent B12 was 411.     B12 from 02/13/16 was 326; from 10/13/17 was 411. TSH from 10/13/17 was 6.10. MRI of brain from 02/26/16 revealed age-related generalized cerebral volume loss and mild nonspecific white matter changes. MRI of brain with and without contrast from 07/19/17  Showed generalized atrophy and moderate chronic small vessel ischemic changes.  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  PAST MEDICAL HISTORY: Past Medical History:  Diagnosis Date  . Cataract   . Depression    situational  . GERD (gastroesophageal reflux disease)   . Goals of care, counseling/discussion 03/20/2017  . Hyperlipidemia   . Hypothyroidism   . Reactive depression 03/20/2017  . Thyroid cancer [facility])    PMH of; on supressive therapy    MEDICATIONS: Current Outpatient Medications on File  Prior to Visit  Medication Sig Dispense Refill  . aspirin 81 MG tablet Take 81 mg by mouth daily.    . bifidobacterium infantis (ALIGN) capsule Take by mouth.    . dexamethasone (DECADRON) 4 MG tablet 1 tablet p.o. twice daily the day before, day of and day after chemotherapy every 3 weeks. 40 tablet 1  . donepezil (ARICEPT) 10 MG tablet TAKE 1 TABLET BY MOUTH AT BEDTIME 90 tablet 3  . escitalopram (LEXAPRO) 20 MG tablet TAKE 1 TABLET BY MOUTH ONCE DAILY AT BEDTIME 90 tablet 3  . folic acid (FOLVITE) 1 MG tablet Take 1 tablet (1 mg total) by mouth daily. 30 tablet 2  . gabapentin (NEURONTIN) 300 MG capsule TAKE 1 CAPSULE BY MOUTH AT NIGHT -- Office visit needed for further refills 90 capsule 0  . levothyroxine (SYNTHROID, LEVOTHROID) 150 MCG tablet TAKE 1 TABLET BY MOUTH ONCE DAILY 90 tablet 1  . lidocaine-prilocaine (EMLA) cream Apply 1 application topically as needed. 30 g 0  . meloxicam (MOBIC) 7.5 MG tablet Take 1 tablet by mouth once daily 90 tablet 0  . omeprazole (PRILOSEC) 20 MG capsule Take 1 capsule (20 mg total) by mouth daily. 90 capsule 1  . prochlorperazine (COMPAZINE) 10 MG tablet Take 1 tablet (10 mg total) by mouth every 6 (six) hours as needed for nausea or vomiting. 30 tablet 0  . rosuvastatin (CRESTOR) 20 MG tablet Take 1 tablet by mouth once daily 90 tablet 0  . trimethoprim-polymyxin b (POLYTRIM) ophthalmic solution Place 2 drops into both eyes every 6 (six) hours for 10 days. 10 mL 1   No current facility-administered medications on file prior to visit.     ALLERGIES: Allergies  Allergen Reactions  . Ativan [Lorazepam] Other (See Comments)    Mental status change-"went crazy in the head"  . Fentanyl Other (See Comments)    hallucinations  . Tramadol Other (See Comments)    Altered mental status "crazy in the head'    FAMILY HISTORY: Family History  Problem Relation Age of Onset  . Alzheimer's disease Mother   . Hypertension Mother   . Heart disease Father         CAD and angioplasty in 48s  . Cancer Father        Bladder  . Other Brother        valvular heart disease  . Diabetes Neg Hx   . Stroke Neg Hx     SOCIAL HISTORY: Social History   Socioeconomic History  . Marital status: Married    Spouse name: Not on file  . Number of children: 2  . Years of education: Not on file  . Highest education level: Not on file  Occupational History  . Occupation: Retired  Scientific laboratory technician  . Financial resource strain: Not on file  . Food insecurity    Worry: Not on file    Inability: Not on file  . Transportation needs    Medical: Not on file    Non-medical: Not on file  Tobacco Use  . Smoking status: Former Smoker  Packs/day: 1.00    Years: 5.00    Pack years: 5.00    Types: Cigarettes    Quit date: 12/07/1958    Years since quitting: 60.6  . Smokeless tobacco: Never Used  . Tobacco comment: smoked 1956-1960, up 1 ppd  Substance and Sexual Activity  . Alcohol use: Yes    Alcohol/week: 14.0 standard drinks    Types: 14 Glasses of wine per week  . Drug use: No  . Sexual activity: Not on file  Lifestyle  . Physical activity    Days per week: Not on file    Minutes per session: Not on file  . Stress: Not on file  Relationships  . Social Herbalist on phone: Not on file    Gets together: Not on file    Attends religious service: Not on file    Active member of club or organization: Not on file    Attends meetings of clubs or organizations: Not on file    Relationship status: Not on file  . Intimate partner violence    Fear of current or ex partner: Not on file    Emotionally abused: Not on file    Physically abused: Not on file    Forced sexual activity: Not on file  Other Topics Concern  . Not on file  Social History Narrative   Exercise:  Yard work, walks on occasion                REVIEW OF SYSTEMS: Constitutional: No fevers, chills, or sweats, no generalized fatigue, change in appetite Eyes: No visual  changes, double vision, eye pain Ear, nose and throat: No hearing loss, ear pain, nasal congestion, sore throat Cardiovascular: No chest pain, palpitations Respiratory:  No shortness of breath at rest or with exertion, wheezes GastrointestinaI: No nausea, vomiting, diarrhea, abdominal pain, fecal incontinence Genitourinary:  No dysuria, urinary retention or frequency Musculoskeletal:  No neck pain, back pain Integumentary: No rash, pruritus, skin lesions Neurological: as above Psychiatric: No depression, insomnia, anxiety Endocrine: No palpitations, fatigue, diaphoresis, mood swings, change in appetite, change in weight, increased thirst Hematologic/Lymphatic:  No purpura, petechiae. Allergic/Immunologic: no itchy/runny eyes, nasal congestion, recent allergic reactions, rashes  PHYSICAL EXAM: Blood pressure 113/77, pulse 68, temperature 99.2 F (37.3 C), temperature source Oral, height 6' (1.829 m), weight 202 lb (91.6 kg), SpO2 96 %. General: No acute distress.  Patient appears well-groomed.   Head:  Normocephalic/atraumatic Eyes:  Fundi examined but not visualized Neck: supple, no paraspinal tenderness, full range of motion Heart:  Regular rate and rhythm Lungs:  Clear to auscultation bilaterally Back: No paraspinal tenderness Neurological Exam: alert and oriented to person, place, but not time. Attention span and concentration fair, recent memory poor, remote memory intact, fund of knowledge intact.  Speech fluent and not dysarthric, language intact.  CN II-XII intact. Bulk and tone normal, muscle strength 5/5 throughout.  Sensation to light touch  intact.  Deep tendon reflexes 2+ throughout.  Finger to nose testing intact.  Gait normal, Romberg negative.   IMPRESSION: Vascular dementia, progression noted,complicated by chemotherapy Hyperlipidemia  PLAN: 1.  Aricept 42m daily 2.  ASA 865mand Crestor to control stroke risk factors 3.  Encouraged to keep hydrated. 4.  Advised to  cease driving 5.  Family should monitor and assist finances and medication management 6.  Mediterranean diet  7.  Follow up in 6 months  20 minutes spent face to face with patient, over 50% spent discussing  management.  Metta Clines, DO  CC: Billey Gosling, MD

## 2019-07-05 ENCOUNTER — Encounter: Payer: Self-pay | Admitting: Neurology

## 2019-07-05 ENCOUNTER — Other Ambulatory Visit: Payer: Self-pay

## 2019-07-05 ENCOUNTER — Ambulatory Visit: Payer: PPO | Admitting: Neurology

## 2019-07-05 VITALS — BP 113/77 | HR 68 | Temp 99.2°F | Ht 72.0 in | Wt 202.0 lb

## 2019-07-05 DIAGNOSIS — F0150 Vascular dementia without behavioral disturbance: Secondary | ICD-10-CM | POA: Diagnosis not present

## 2019-07-05 NOTE — Patient Instructions (Addendum)
Continue donepezil 10mg  at bedtime Continue aspirin 81mg  daily Continue Crestor Stay hydrated I would like you to stop driving as I do not feel it is safe. Follow up in 6 months.

## 2019-07-06 ENCOUNTER — Inpatient Hospital Stay: Payer: PPO

## 2019-07-06 DIAGNOSIS — Z452 Encounter for adjustment and management of vascular access device: Secondary | ICD-10-CM | POA: Diagnosis not present

## 2019-07-06 DIAGNOSIS — C450 Mesothelioma of pleura: Secondary | ICD-10-CM

## 2019-07-06 LAB — CBC WITH DIFFERENTIAL (CANCER CENTER ONLY)
Abs Immature Granulocytes: 0.03 10*3/uL (ref 0.00–0.07)
Basophils Absolute: 0 10*3/uL (ref 0.0–0.1)
Basophils Relative: 1 %
Eosinophils Absolute: 0.1 10*3/uL (ref 0.0–0.5)
Eosinophils Relative: 4 %
HCT: 41.3 % (ref 39.0–52.0)
Hemoglobin: 14.3 g/dL (ref 13.0–17.0)
Immature Granulocytes: 1 %
Lymphocytes Relative: 33 %
Lymphs Abs: 1 10*3/uL (ref 0.7–4.0)
MCH: 33.6 pg (ref 26.0–34.0)
MCHC: 34.6 g/dL (ref 30.0–36.0)
MCV: 97.2 fL (ref 80.0–100.0)
Monocytes Absolute: 0.5 10*3/uL (ref 0.1–1.0)
Monocytes Relative: 14 %
Neutro Abs: 1.5 10*3/uL — ABNORMAL LOW (ref 1.7–7.7)
Neutrophils Relative %: 47 %
Platelet Count: 99 10*3/uL — ABNORMAL LOW (ref 150–400)
RBC: 4.25 MIL/uL (ref 4.22–5.81)
RDW: 12.4 % (ref 11.5–15.5)
WBC Count: 3.2 10*3/uL — ABNORMAL LOW (ref 4.0–10.5)
nRBC: 0 % (ref 0.0–0.2)

## 2019-07-06 LAB — CMP (CANCER CENTER ONLY)
ALT: 33 U/L (ref 0–44)
AST: 50 U/L — ABNORMAL HIGH (ref 15–41)
Albumin: 3.3 g/dL — ABNORMAL LOW (ref 3.5–5.0)
Alkaline Phosphatase: 72 U/L (ref 38–126)
Anion gap: 10 (ref 5–15)
BUN: 16 mg/dL (ref 8–23)
CO2: 24 mmol/L (ref 22–32)
Calcium: 8.6 mg/dL — ABNORMAL LOW (ref 8.9–10.3)
Chloride: 107 mmol/L (ref 98–111)
Creatinine: 1.03 mg/dL (ref 0.61–1.24)
GFR, Est AFR Am: >60 mL/min (ref >60)
GFR, Estimated: >60 mL/min (ref >60)
Glucose, Bld: 95 mg/dL (ref 70–99)
Potassium: 4.4 mmol/L (ref 3.5–5.1)
Sodium: 141 mmol/L (ref 135–145)
Total Bilirubin: 0.6 mg/dL (ref 0.3–1.2)
Total Protein: 6.2 g/dL — ABNORMAL LOW (ref 6.5–8.1)

## 2019-07-10 ENCOUNTER — Other Ambulatory Visit: Payer: Self-pay | Admitting: Neurology

## 2019-07-12 ENCOUNTER — Telehealth: Payer: Self-pay | Admitting: *Deleted

## 2019-07-12 NOTE — Telephone Encounter (Signed)
Received call from pt's daughter. Her father has appts here tomorrow for labs, Dr. Julien Nordmann and treatment.  She is requesting additional IV fluids for him during his treatment time.  She states it makes him feel better.   Pt does not drink a lot of fluids at home per daughter's report.  Advised that Dr. Julien Nordmann will review labs and evaluate him and order fluids if indicated.   No further questions or concerns

## 2019-07-13 ENCOUNTER — Inpatient Hospital Stay: Payer: PPO | Attending: Internal Medicine | Admitting: Internal Medicine

## 2019-07-13 ENCOUNTER — Other Ambulatory Visit: Payer: Self-pay

## 2019-07-13 ENCOUNTER — Inpatient Hospital Stay: Payer: PPO

## 2019-07-13 ENCOUNTER — Encounter: Payer: Self-pay | Admitting: Internal Medicine

## 2019-07-13 VITALS — BP 158/112 | HR 80 | Temp 98.3°F | Resp 19 | Wt 199.0 lb

## 2019-07-13 VITALS — BP 132/100

## 2019-07-13 DIAGNOSIS — Z888 Allergy status to other drugs, medicaments and biological substances status: Secondary | ICD-10-CM | POA: Insufficient documentation

## 2019-07-13 DIAGNOSIS — R918 Other nonspecific abnormal finding of lung field: Secondary | ICD-10-CM | POA: Insufficient documentation

## 2019-07-13 DIAGNOSIS — I10 Essential (primary) hypertension: Secondary | ICD-10-CM

## 2019-07-13 DIAGNOSIS — C450 Mesothelioma of pleura: Secondary | ICD-10-CM | POA: Diagnosis not present

## 2019-07-13 DIAGNOSIS — Z5112 Encounter for antineoplastic immunotherapy: Secondary | ICD-10-CM | POA: Insufficient documentation

## 2019-07-13 DIAGNOSIS — Z85850 Personal history of malignant neoplasm of thyroid: Secondary | ICD-10-CM | POA: Diagnosis not present

## 2019-07-13 DIAGNOSIS — Z8719 Personal history of other diseases of the digestive system: Secondary | ICD-10-CM | POA: Insufficient documentation

## 2019-07-13 DIAGNOSIS — J984 Other disorders of lung: Secondary | ICD-10-CM | POA: Diagnosis not present

## 2019-07-13 DIAGNOSIS — J90 Pleural effusion, not elsewhere classified: Secondary | ICD-10-CM | POA: Insufficient documentation

## 2019-07-13 DIAGNOSIS — R5383 Other fatigue: Secondary | ICD-10-CM | POA: Insufficient documentation

## 2019-07-13 DIAGNOSIS — K4020 Bilateral inguinal hernia, without obstruction or gangrene, not specified as recurrent: Secondary | ICD-10-CM | POA: Insufficient documentation

## 2019-07-13 DIAGNOSIS — E039 Hypothyroidism, unspecified: Secondary | ICD-10-CM | POA: Diagnosis not present

## 2019-07-13 DIAGNOSIS — F329 Major depressive disorder, single episode, unspecified: Secondary | ICD-10-CM | POA: Diagnosis not present

## 2019-07-13 DIAGNOSIS — K5730 Diverticulosis of large intestine without perforation or abscess without bleeding: Secondary | ICD-10-CM | POA: Diagnosis not present

## 2019-07-13 DIAGNOSIS — K219 Gastro-esophageal reflux disease without esophagitis: Secondary | ICD-10-CM | POA: Insufficient documentation

## 2019-07-13 DIAGNOSIS — Z9221 Personal history of antineoplastic chemotherapy: Secondary | ICD-10-CM | POA: Insufficient documentation

## 2019-07-13 DIAGNOSIS — Z79899 Other long term (current) drug therapy: Secondary | ICD-10-CM | POA: Insufficient documentation

## 2019-07-13 DIAGNOSIS — E785 Hyperlipidemia, unspecified: Secondary | ICD-10-CM | POA: Diagnosis not present

## 2019-07-13 DIAGNOSIS — Z5111 Encounter for antineoplastic chemotherapy: Secondary | ICD-10-CM | POA: Diagnosis not present

## 2019-07-13 DIAGNOSIS — Z885 Allergy status to narcotic agent status: Secondary | ICD-10-CM | POA: Insufficient documentation

## 2019-07-13 LAB — CBC WITH DIFFERENTIAL (CANCER CENTER ONLY)
Abs Immature Granulocytes: 0.2 10*3/uL — ABNORMAL HIGH (ref 0.00–0.07)
Basophils Absolute: 0 10*3/uL (ref 0.0–0.1)
Basophils Relative: 1 %
Eosinophils Absolute: 0 10*3/uL (ref 0.0–0.5)
Eosinophils Relative: 0 %
HCT: 42.5 % (ref 39.0–52.0)
Hemoglobin: 14.8 g/dL (ref 13.0–17.0)
Immature Granulocytes: 3 %
Lymphocytes Relative: 13 %
Lymphs Abs: 0.8 10*3/uL (ref 0.7–4.0)
MCH: 33.4 pg (ref 26.0–34.0)
MCHC: 34.8 g/dL (ref 30.0–36.0)
MCV: 95.9 fL (ref 80.0–100.0)
Monocytes Absolute: 0.7 10*3/uL (ref 0.1–1.0)
Monocytes Relative: 11 %
Neutro Abs: 4.4 10*3/uL (ref 1.7–7.7)
Neutrophils Relative %: 72 %
Platelet Count: 198 10*3/uL (ref 150–400)
RBC: 4.43 MIL/uL (ref 4.22–5.81)
RDW: 13.1 % (ref 11.5–15.5)
WBC Count: 6.1 10*3/uL (ref 4.0–10.5)
nRBC: 0 % (ref 0.0–0.2)

## 2019-07-13 LAB — CMP (CANCER CENTER ONLY)
ALT: 39 U/L (ref 0–44)
AST: 56 U/L — ABNORMAL HIGH (ref 15–41)
Albumin: 3.7 g/dL (ref 3.5–5.0)
Alkaline Phosphatase: 79 U/L (ref 38–126)
Anion gap: 15 (ref 5–15)
BUN: 25 mg/dL — ABNORMAL HIGH (ref 8–23)
CO2: 17 mmol/L — ABNORMAL LOW (ref 22–32)
Calcium: 9.4 mg/dL (ref 8.9–10.3)
Chloride: 107 mmol/L (ref 98–111)
Creatinine: 1.44 mg/dL — ABNORMAL HIGH (ref 0.61–1.24)
GFR, Est AFR Am: 52 mL/min — ABNORMAL LOW (ref >60)
GFR, Estimated: 45 mL/min — ABNORMAL LOW (ref >60)
Glucose, Bld: 192 mg/dL — ABNORMAL HIGH (ref 70–99)
Potassium: 4.5 mmol/L (ref 3.5–5.1)
Sodium: 139 mmol/L (ref 135–145)
Total Bilirubin: 0.7 mg/dL (ref 0.3–1.2)
Total Protein: 6.8 g/dL (ref 6.5–8.1)

## 2019-07-13 LAB — TOTAL PROTEIN, URINE DIPSTICK: Protein, ur: 30 mg/dL — AB

## 2019-07-13 MED ORDER — SODIUM CHLORIDE 0.9 % IV SOLN
470.0000 mg/m2 | Freq: Once | INTRAVENOUS | Status: AC
  Administered 2019-07-13: 1000 mg via INTRAVENOUS
  Filled 2019-07-13: qty 40

## 2019-07-13 MED ORDER — HEPARIN SOD (PORK) LOCK FLUSH 100 UNIT/ML IV SOLN
500.0000 [IU] | Freq: Once | INTRAVENOUS | Status: AC | PRN
  Administered 2019-07-13: 500 [IU]
  Filled 2019-07-13: qty 5

## 2019-07-13 MED ORDER — SODIUM CHLORIDE 0.9 % IV SOLN
Freq: Once | INTRAVENOUS | Status: AC
  Administered 2019-07-13: 13:00:00 via INTRAVENOUS
  Filled 2019-07-13: qty 5

## 2019-07-13 MED ORDER — SODIUM CHLORIDE 0.9 % IV SOLN
15.0000 mg/kg | Freq: Once | INTRAVENOUS | Status: AC
  Administered 2019-07-13: 1400 mg via INTRAVENOUS
  Filled 2019-07-13: qty 48

## 2019-07-13 MED ORDER — PALONOSETRON HCL INJECTION 0.25 MG/5ML
INTRAVENOUS | Status: AC
  Filled 2019-07-13: qty 5

## 2019-07-13 MED ORDER — CLONIDINE HCL 0.1 MG PO TABS
0.1000 mg | ORAL_TABLET | ORAL | Status: AC
  Administered 2019-07-13: 0.1 mg via ORAL

## 2019-07-13 MED ORDER — CYANOCOBALAMIN 1000 MCG/ML IJ SOLN
1000.0000 ug | Freq: Once | INTRAMUSCULAR | Status: AC
  Administered 2019-07-13: 15:00:00 1000 ug via INTRAMUSCULAR

## 2019-07-13 MED ORDER — CLONIDINE HCL 0.1 MG PO TABS
ORAL_TABLET | ORAL | Status: AC
  Filled 2019-07-13: qty 1

## 2019-07-13 MED ORDER — SODIUM CHLORIDE 0.9 % IV SOLN
380.0000 mg | Freq: Once | INTRAVENOUS | Status: AC
  Administered 2019-07-13: 380 mg via INTRAVENOUS
  Filled 2019-07-13: qty 38

## 2019-07-13 MED ORDER — SODIUM CHLORIDE 0.9 % IV SOLN
Freq: Once | INTRAVENOUS | Status: AC
  Administered 2019-07-13: 13:00:00 via INTRAVENOUS
  Filled 2019-07-13: qty 250

## 2019-07-13 MED ORDER — SODIUM CHLORIDE 0.9% FLUSH
10.0000 mL | INTRAVENOUS | Status: DC | PRN
  Administered 2019-07-13: 10 mL
  Filled 2019-07-13: qty 10

## 2019-07-13 MED ORDER — CYANOCOBALAMIN 1000 MCG/ML IJ SOLN
INTRAMUSCULAR | Status: AC
  Filled 2019-07-13: qty 1

## 2019-07-13 MED ORDER — PALONOSETRON HCL INJECTION 0.25 MG/5ML
0.2500 mg | Freq: Once | INTRAVENOUS | Status: AC
  Administered 2019-07-13: 0.25 mg via INTRAVENOUS

## 2019-07-13 NOTE — Progress Notes (Signed)
Per Dr. Julien Nordmann, 500 mL normal saline bolus given due to decreased renal function.

## 2019-07-13 NOTE — Progress Notes (Signed)
[facility] [phone]   [phone]  OFFICE PROGRESS NOTE  [name], MD [place] [address]  DIAGNOSIS: Stage II malignant pleural mesothelioma, epithelioid type involving the left hemithorax diagnosed in March 2018.  PRIOR THERAPY:  1) Status post left VATS with biopsy and wedge resection of the left lower lobe as well as parietal pleurectomy under the care of Dr. Roxan Hockey on 02/15/2017. 2) Palliative systemic chemotherapy with carboplatin for AUC of 5, Alimta 500 MG/M2 and Avastin 15 MG/KG every 3 weeks. Status post 6 cycles.  The patient has been on observation for almost 2 years.  CURRENT THERAPY: Resuming systemic chemotherapy with carboplatin for AUC of 5, Alimta 500 mg/M2 and Avastin 15 mg/KG every 3 weeks.  First dose May 31, 2019.  Status post 2 cycles.  INTERVAL HISTORY: Dean Pruitt 85 y.o. male returns to the clinic today for follow-up visit.  The patient is feeling fine today with no concerning complaints except for mild fatigue.  He also has occasional chest pain.  He denied having any shortness of breath, cough or hemoptysis.  He denied having any fever or chills.  He has no nausea, vomiting, diarrhea or constipation.  He has no headache or visual changes.  He lost around 3 pounds since his last visit.  He continues to tolerate his systemic chemotherapy fairly well.  He is here today for evaluation before starting cycle #3.   MEDICAL HISTORY: Past Medical History:  Diagnosis Date  . Cataract   . Depression    situational  . GERD (gastroesophageal reflux disease)   . Goals of care, counseling/discussion 03/20/2017  . Hyperlipidemia   . Hypothyroidism   . Reactive depression 03/20/2017  . Thyroid cancer ([place])    PMH of; on supressive therapy    ALLERGIES:  is allergic to ativan [lorazepam]; fentanyl; and tramadol.  MEDICATIONS:  Current Outpatient Medications  Medication Sig Dispense Refill   . aspirin 81 MG tablet Take 81 mg by mouth daily.    . bifidobacterium infantis (ALIGN) capsule Take by mouth.    . dexamethasone (DECADRON) 4 MG tablet 1 tablet p.o. twice daily the day before, day of and day after chemotherapy every 3 weeks. 40 tablet 1  . donepezil (ARICEPT) 10 MG tablet TAKE 1 TABLET BY MOUTH AT BEDTIME 90 tablet 0  . escitalopram (LEXAPRO) 20 MG tablet TAKE 1 TABLET BY MOUTH ONCE DAILY AT BEDTIME 90 tablet 0  . folic acid (FOLVITE) 1 MG tablet Take 1 tablet (1 mg total) by mouth daily. 30 tablet 2  . gabapentin (NEURONTIN) 300 MG capsule TAKE 1 CAPSULE BY MOUTH AT NIGHT -- Office visit needed for further refills 90 capsule 0  . levothyroxine (SYNTHROID, LEVOTHROID) 150 MCG tablet TAKE 1 TABLET BY MOUTH ONCE DAILY 90 tablet 1  . lidocaine-prilocaine (EMLA) cream Apply 1 application topically as needed. 30 g 0  . meloxicam (MOBIC) 7.5 MG tablet Take 1 tablet by mouth once daily 90 tablet 0  . omeprazole (PRILOSEC) 20 MG capsule Take 1 capsule (20 mg total) by mouth daily. 90 capsule 1  . prochlorperazine (COMPAZINE) 10 MG tablet Take 1 tablet (10 mg total) by mouth every 6 (six) hours as needed for nausea or vomiting. 30 tablet 0  . rosuvastatin (CRESTOR) 20 MG tablet Take 1 tablet by mouth once daily 90 tablet 0   No current facility-administered medications for this visit.     SURGICAL HISTORY:  Past Surgical History:  Procedure Laterality Date  . CATARACT EXTRACTION, BILATERAL  2013 & 2014   Dr Celene Squibb  . colon polyps  2002 & 2005   negative 2008; Dr Earlean Shawl  . COLONOSCOPY W/ POLYPECTOMY    . LUNG BIOPSY Left 02/15/2017   Procedure: LUNG BIOPSY;  Surgeon: Melrose Nakayama, MD;  Location: [facility];  Service: Thoracic;  Laterality: Left;  . PLEURAL EFFUSION DRAINAGE Left 02/15/2017   Procedure: DRAINAGE OF PLEURAL EFFUSION;  Surgeon: Melrose Nakayama, MD;  Location: [facility];  Service: Thoracic;  Laterality: Left;  . PORTACATH PLACEMENT N/A 03/25/2017   Procedure:  INSERTION PORT-A-CATH, right internal jugular;  Surgeon: Melrose Nakayama, MD;  Location: [facility];  Service: Thoracic;  Laterality: N/A;  . THYROIDECTOMY  2001   S/P RAI  . VIDEO ASSISTED THORACOSCOPY Left 02/15/2017   Procedure: VIDEO ASSISTED THORACOSCOPY;  Surgeon: Melrose Nakayama, MD;  Location: [facility];  Service: Thoracic;  Laterality: Left;    REVIEW OF SYSTEMS:  A comprehensive review of systems was negative except for: Constitutional: positive for fatigue   PHYSICAL EXAMINATION: General appearance: alert, cooperative, fatigued and no distress Head: Normocephalic, without obvious abnormality, atraumatic Neck: no adenopathy, no JVD, supple, symmetrical, trachea midline and thyroid not enlarged, symmetric, no tenderness/mass/nodules Lymph nodes: Cervical, supraclavicular, and axillary nodes normal. Resp: clear to auscultation bilaterally Back: symmetric, no curvature. ROM normal. No CVA tenderness. Cardio: regular rate and rhythm, S1, S2 normal, no murmur, click, rub or gallop GI: soft, non-tender; bowel sounds normal; no masses,  no organomegaly Extremities: extremities normal, atraumatic, no cyanosis or edema  ECOG PERFORMANCE STATUS: 1 - Symptomatic but completely ambulatory  There were no vitals taken for this visit.  LABORATORY DATA: Lab Results  Component Value Date   WBC 6.1 07/13/2019   HGB 14.8 07/13/2019   HCT 42.5 07/13/2019   MCV 95.9 07/13/2019   PLT 198 07/13/2019      Chemistry      Component Value Date/Time   NA 141 07/06/2019 1418   NA 140 11/04/2017 1210   K 4.4 07/06/2019 1418   K 4.1 11/04/2017 1210   CL 107 07/06/2019 1418   CO2 24 07/06/2019 1418   CO2 26 11/04/2017 1210   BUN 16 07/06/2019 1418   BUN 20.1 11/04/2017 1210   CREATININE 1.03 07/06/2019 1418   CREATININE 1.2 11/04/2017 1210      Component Value Date/Time   CALCIUM 8.6 (L) 07/06/2019 1418   CALCIUM 9.6 11/04/2017 1210   ALKPHOS 72 07/06/2019 1418   ALKPHOS 62 11/04/2017  1210   AST 50 (H) 07/06/2019 1418   AST 37 (H) 11/04/2017 1210   ALT 33 07/06/2019 1418   ALT 22 11/04/2017 1210   BILITOT 0.6 07/06/2019 1418   BILITOT 1.32 (H) 11/04/2017 1210       RADIOGRAPHIC STUDIES: No results found.  ASSESSMENT AND PLAN:  This is a very pleasant 85 years old white male with malignant pleural mesothelioma involving the left hemothorax. He completed treatment with systemic chemotherapy with carboplatin, Alimta and Avastin status post 6 cycles. He has been tolerating the treatment fairly well with no significant adverse effects except for fatigue. The patient has been in observation for almost 2 years. Repeat the scan and June 2020 showed evidence for recurrent disease in the left side of the chest with several areas of progression compared to the previous imaging studies. The patient was a started on systemic chemotherapy again with carboplatin for AUC of  5, Alimta 500 mg/M2 and Avastin 15 mg/KG every 3 weeks.  He status post 2 cycles.   The patient continues to tolerate his treatment well with no concerning adverse effect except for mild fatigue. I recommended for him to proceed with cycle #3 today as planned. I will see him back for follow-up visit in 3 weeks for evaluation after repeating CT scan of the chest, abdomen pelvis for restaging of his disease. For the hypertension, he was advised to take his blood pressure medication as prescribed and to monitor it closely at home. He was advised to call immediately if he has any concerning symptoms in the interval. The patient voices understanding of current disease status and treatment options and is in agreement with the current care plan. All questions were answered. The patient knows to call the clinic with any problems, questions or concerns. We can certainly see the patient much sooner if necessary.  Disclaimer: This note was dictated with voice recognition software. Similar sounding words can inadvertently be  transcribed and may not be corrected upon review.

## 2019-07-13 NOTE — Patient Instructions (Addendum)
[facility] Discharge Instructions for Patients Receiving Chemotherapy  Today you received the following chemotherapy agents: Avastin, Alimta, and Carboplatin.  To help prevent nausea and vomiting after your treatment, we encourage you to take your nausea medication as directed.   If you develop nausea and vomiting that is not controlled by your nausea medication, call the clinic.   BELOW ARE SYMPTOMS THAT SHOULD BE REPORTED IMMEDIATELY:  *FEVER GREATER THAN 100.5 F  *CHILLS WITH OR WITHOUT FEVER  NAUSEA AND VOMITING THAT IS NOT CONTROLLED WITH YOUR NAUSEA MEDICATION  *UNUSUAL SHORTNESS OF BREATH  *UNUSUAL BRUISING OR BLEEDING  TENDERNESS IN MOUTH AND THROAT WITH OR WITHOUT PRESENCE OF ULCERS  *URINARY PROBLEMS  *BOWEL PROBLEMS  UNUSUAL RASH Items with * indicate a potential emergency and should be followed up as soon as possible.  Feel free to call the clinic should you have any questions or concerns. The clinic phone number is [phone].  Please show the [facility] at check-in to the Emergency Department and triage nurse.   Rehydration, Adult Rehydration is the replacement of body fluids and salts and minerals (electrolytes) that are lost during dehydration. Dehydration is when there is not enough fluid or water in the body. This happens when you lose more fluids than you take in. Common causes of dehydration include:  Vomiting.  Diarrhea.  Excessive sweating, such as from heat exposure or exercise.  Taking medicines that cause the body to lose excess fluid (diuretics).  Impaired kidney function.  Not drinking enough fluid.  Certain illnesses or infections.  Certain poorly controlled long-term (chronic) illnesses, such as diabetes, heart disease, and kidney disease.  Symptoms of mild dehydration may include thirst, dry lips and mouth, dry skin, and dizziness. Symptoms of severe dehydration may include increased heart rate, confusion,  fainting, and not urinating. You can rehydrate by drinking certain fluids or getting fluids through an IV tube, as told by your health care provider. What are the risks? Generally, rehydration is safe. However, one problem that can happen is taking in too much fluid (overhydration). This is rare. If overhydration happens, it can cause an electrolyte imbalance, kidney failure, or a decrease in salt (sodium) levels in the body. How to rehydrate Follow instructions from your health care provider for rehydration. The kind of fluid you should drink and the amount you should drink depend on your condition.  If directed by your health care provider, drink an oral rehydration solution (ORS). This is a drink designed to treat dehydration that is found in pharmacies and retail stores. ? Make an ORS by following instructions on the package. ? Start by drinking small amounts, about  cup (120 mL) every 5-10 minutes. ? Slowly increase how much you drink until you have taken the amount recommended by your health care provider.  Drink enough clear fluids to keep your urine clear or pale yellow. If you were instructed to drink an ORS, finish the ORS first, then start slowly drinking other clear fluids. Drink fluids such as: ? Water. Do not drink only water. Doing that can lead to having too little sodium in your body (hyponatremia). ? Ice chips. ? Fruit juice that you have added water to (diluted juice). ? Low-calorie sports drinks.  If you are severely dehydrated, your health care provider may recommend that you receive fluids through an IV tube in the hospital.  Do not take sodium tablets. Doing that can lead to the condition of having too much sodium in your  body (hypernatremia). Eating while you rehydrate Follow instructions from your health care provider about what to eat while you rehydrate. Your health care provider may recommend that you slowly begin eating regular foods in small amounts.  Eat foods  that contain a healthy balance of electrolytes, such as bananas, oranges, potatoes, tomatoes, and spinach.  Avoid foods that are greasy or contain a lot of fat or sugar.  In some cases, you may get nutrition through a feeding tube that is passed through your nose and into your stomach (nasogastric tube, or NG tube). This may be done if you have uncontrolled vomiting or diarrhea. Beverages to avoid Certain beverages may make dehydration worse. While you rehydrate, avoid:  Alcohol.  Caffeine.  Drinks that contain a lot of sugar. These include: ? High-calorie sports drinks. ? Fruit juice that is not diluted. ? Soda.  Check nutrition labels to see how much sugar or caffeine a beverage contains. Signs of dehydration recovery You may be recovering from dehydration if:  You are urinating more often than before you started rehydrating.  Your urine is clear or pale yellow.  Your energy level improves.  You vomit less frequently.  You have diarrhea less frequently.  Your appetite improves or returns to normal.  You feel less dizzy or less light-headed.  Your skin tone and color start to look more normal. Contact a health care provider if:  You continue to have symptoms of mild dehydration, such as: ? Thirst. ? Dry lips. ? Slightly dry mouth. ? Dry, warm skin. ? Dizziness.  You continue to vomit or have diarrhea. Get help right away if:  You have symptoms of dehydration that get worse.  You feel: ? Confused. ? Weak. ? Like you are going to faint.  You have not urinated in 6-8 hours.  You have very dark urine.  You have trouble breathing.  Your heart rate while sitting still is over 100 beats a minute.  You cannot drink fluids without vomiting.  You have vomiting or diarrhea that: ? Gets worse. ? Does not go away.  You have a fever. This information is not intended to replace advice given to you by your health care provider. Make sure you discuss any  questions you have with your health care provider. Document Released: 02/15/2012 Document Revised: 11/05/2017 Document Reviewed: 01/17/2016 Elsevier Patient Education  2020 [employer].

## 2019-07-17 DIAGNOSIS — K219 Gastro-esophageal reflux disease without esophagitis: Secondary | ICD-10-CM | POA: Insufficient documentation

## 2019-07-17 NOTE — Progress Notes (Signed)
Subjective:    Patient ID: Dean Pruitt, male    DOB: Mar 20, 1934, 85 y.o.   MRN: [id number]  HPI The patient is here for follow up.  His daughter is with him.   Two days ago he collapsed.  He was breathing funny, his eyes were open,  He was very weak and his BP was 150/100 when EMS checked it, his daughter denies increased confusion after the event.  He has had a few episodes of pre-syncope.  She states that he tends to get cold, clammy and looks gray.  She thinks that some of these episodes do occur the third day after chemotherapy, but is not sure if all of them have.  His blood pressure has been variable-sometimes low and sometimes high.  She states he is not drinking enough water.  He is eating fairly well.  She does not check his blood pressure on a regular basis.  Malignant pleural mesothelioma: He is following with oncology.  He has restarted chemotherapy.  He just completed cycle 3.  Hyperlipidemia: He is taking his medication daily. He is compliant with a low fat/cholesterol diet. He denies myalgias.   GERD:  He is taking his medication daily as prescribed.  He denies any GERD symptoms and feels his GERD is well controlled.   Prediabetes:  He is fairly compliant with a low sugar/carbohydrate diet.  He is not exercising regularly.  Mild cognitive impairment: He is taking Aricept daily.  Depression: He is taking his medication daily as prescribed. He denies any side effects from the medication. He feels his depression is well controlled and he is happy with his current dose of medication.   Hypothyroidism:  He is taking his medication daily.  He denies any recent changes in energy or weight that are unexplained.   Chronic lower back pain: He is taking gabapentin.  He feels the medication does help with the pain.   Medications and allergies reviewed with patient and updated if appropriate.  Patient Active Problem List   Diagnosis Date Noted  . GERD (gastroesophageal  reflux disease) 07/17/2019  . Loose stools 08/05/2018  . Ankle swelling, right 08/05/2018  . Urinary incontinence 08/05/2018  . Greater trochanteric bursitis, right 01/27/2018  . Otalgia, right 12/21/2017  . Bursitis of hip 11/17/2017  . Bilateral low back pain without sciatica 11/17/2017  . Thoracic aortic aneurysm without rupture ([place]) 11/09/2017  . Muscular chest pain 11/09/2017  . Recurrent falls 11/09/2017  . Hypothyroidism 10/13/2017  . B12 deficiency 10/13/2017  . Change in stool 10/13/2017  . Port catheter in place 06/02/2017  . Degenerative arthritis of left knee 03/24/2017  . Goals of care, counseling/discussion 03/20/2017  . Encounter for antineoplastic chemotherapy 03/20/2017  . Malignant mesothelioma of pleura ([place]) 02/19/2017  . History of lung biopsy 02/17/2017  . Greater trochanteric bursitis of left hip 12/31/2016  . Pleural effusion, left 10/27/2016  . infrarenal abdominal aortic aneurysm without rupture ([place]) - [place] needed 2020 10/22/2016  . Left lumbar radiculopathy 10/12/2016  . Bursitis of left hip 09/30/2016  . Left foot drop 09/30/2016  . Mild cognitive impairment 07/03/2016  . Depression 07/03/2016  . Frequency of urination 09/26/2014  . Macular degeneration 06/22/2011  . Prediabetes 05/20/2009  . NONSPECIFIC ABNORMAL ELECTROCARDIOGRAM 05/20/2009  . THYROID CANCER, HX OF 05/20/2009  . History of colonic polyps 05/20/2009  . Hyperlipidemia 11/21/2007    Current Outpatient Medications on File Prior to Visit  Medication Sig Dispense Refill  . aspirin 81 MG  tablet Take 81 mg by mouth daily.    . bifidobacterium infantis (ALIGN) capsule Take by mouth.    . dexamethasone (DECADRON) 4 MG tablet 1 tablet p.o. twice daily the day before, day of and day after chemotherapy every 3 weeks. 40 tablet 1  . donepezil (ARICEPT) 10 MG tablet TAKE 1 TABLET BY MOUTH AT BEDTIME 90 tablet 0  . escitalopram (LEXAPRO) 20 MG tablet TAKE 1 TABLET BY MOUTH ONCE DAILY AT BEDTIME  90 tablet 0  . folic acid (FOLVITE) 1 MG tablet Take 1 tablet (1 mg total) by mouth daily. 30 tablet 2  . levothyroxine (SYNTHROID, LEVOTHROID) 150 MCG tablet TAKE 1 TABLET BY MOUTH ONCE DAILY 90 tablet 1  . lidocaine-prilocaine (EMLA) cream Apply 1 application topically as needed. 30 g 0  . meloxicam (MOBIC) 7.5 MG tablet Take 1 tablet by mouth once daily 90 tablet 0  . omeprazole (PRILOSEC) 20 MG capsule Take 1 capsule (20 mg total) by mouth daily. 90 capsule 1  . prochlorperazine (COMPAZINE) 10 MG tablet Take 1 tablet (10 mg total) by mouth every 6 (six) hours as needed for nausea or vomiting. 30 tablet 0  . rosuvastatin (CRESTOR) 20 MG tablet Take 1 tablet by mouth once daily 90 tablet 0   No current facility-administered medications on file prior to visit.     Past Medical History:  Diagnosis Date  . Cataract   . Depression    situational  . GERD (gastroesophageal reflux disease)   . Goals of care, counseling/discussion 03/20/2017  . Hyperlipidemia   . Hypothyroidism   . Reactive depression 03/20/2017  . Thyroid cancer ([facility])    PMH of; on supressive therapy    Past Surgical History:  Procedure Laterality Date  . CATARACT EXTRACTION, BILATERAL  2013 & 2014   Dr Celene Squibb  . colon polyps  2002 & 2005   negative 2008; Dr Earlean Shawl  . COLONOSCOPY W/ POLYPECTOMY    . LUNG BIOPSY Left 02/15/2017   Procedure: LUNG BIOPSY;  Surgeon: Melrose Nakayama, MD;  Location: [facility];  Service: Thoracic;  Laterality: Left;  . PLEURAL EFFUSION DRAINAGE Left 02/15/2017   Procedure: DRAINAGE OF PLEURAL EFFUSION;  Surgeon: Melrose Nakayama, MD;  Location: [facility];  Service: Thoracic;  Laterality: Left;  . PORTACATH PLACEMENT N/A 03/25/2017   Procedure: INSERTION PORT-A-CATH, right internal jugular;  Surgeon: Melrose Nakayama, MD;  Location: [facility];  Service: Thoracic;  Laterality: N/A;  . THYROIDECTOMY  2001   S/P RAI  . VIDEO ASSISTED THORACOSCOPY Left 02/15/2017   Procedure: VIDEO ASSISTED  THORACOSCOPY;  Surgeon: Melrose Nakayama, MD;  Location: [facility];  Service: Thoracic;  Laterality: Left;    Social History   Socioeconomic History  . Marital status: Married    Spouse name: Not on file  . Number of children: 2  . Years of education: Not on file  . Highest education level: Not on file  Occupational History  . Occupation: Retired  Scientific laboratory technician  . Financial resource strain: Not on file  . Food insecurity    Worry: Not on file    Inability: Not on file  . Transportation needs    Medical: Not on file    Non-medical: Not on file  Tobacco Use  . Smoking status: Former Smoker    Packs/day: 1.00    Years: 5.00    Pack years: 5.00    Types: Cigarettes    Quit date: 12/07/1958    Years since quitting: 60.6  .  Smokeless tobacco: Never Used  . Tobacco comment: smoked 1956-1960, up 1 ppd  Substance and Sexual Activity  . Alcohol use: Yes    Alcohol/week: 14.0 standard drinks    Types: 14 Glasses of wine per week  . Drug use: No  . Sexual activity: Not on file  Lifestyle  . Physical activity    Days per week: Not on file    Minutes per session: Not on file  . Stress: Not on file  Relationships  . Social Herbalist on phone: Not on file    Gets together: Not on file    Attends religious service: Not on file    Active member of club or organization: Not on file    Attends meetings of clubs or organizations: Not on file    Relationship status: Not on file  Other Topics Concern  . Not on file  Social History Narrative   Exercise:  Yard work, walks on occasion                Family History  Problem Relation Age of Onset  . Alzheimer's disease Mother   . Hypertension Mother   . Heart disease Father        CAD and angioplasty in 36s  . Cancer Father        Bladder  . Other Brother        valvular heart disease  . Diabetes Neg Hx   . Stroke Neg Hx     Review of Systems  Constitutional: Negative for appetite change and fever.   Respiratory: Positive for cough. Negative for shortness of breath and wheezing.   Cardiovascular: Positive for chest pain (occ with deep breaths). Negative for palpitations and leg swelling.  Neurological: Positive for light-headedness. Negative for numbness and headaches.       Objective:   Vitals:   07/18/19 1341  BP: 108/72  Pulse: 74  Resp: 16  Temp: 97.7 F (36.5 C)  SpO2: 93%   BP Readings from Last 3 Encounters:  07/18/19 108/72  07/13/19 (!) 132/100  07/13/19 (!) 158/112   Wt Readings from Last 3 Encounters:  07/13/19 199 lb (90.3 kg)  07/05/19 202 lb (91.6 kg)  06/30/19 199 lb 11.2 oz (90.6 kg)   There is no height or weight on file to calculate BMI.   Physical Exam    Constitutional: Appears well-developed and well-nourished. No distress.  HENT:  Head: Normocephalic and atraumatic.  Neck: Neck supple. No tracheal deviation present. No thyromegaly present.  No cervical lymphadenopathy Cardiovascular: Normal rate, regular rhythm and normal heart sounds.   No murmur heard. No carotid bruit .  No edema Pulmonary/Chest: Effort normal and breath sounds normal. No respiratory distress. No has no wheezes. No rales.  Skin: Skin is warm and dry. Not diaphoretic.  Psychiatric: Normal mood and affect. Behavior is normal.      Assessment & Plan:    See Problem List for Assessment and Plan of chronic medical problems.

## 2019-07-17 NOTE — Patient Instructions (Addendum)
  Tests ordered today. Your results will be released to [facility] (or called to you) after review.  If any changes need to be made, you will be notified at that same time.    Medications reviewed and updated.  Changes include :   none  Your prescription(s) have been submitted to your pharmacy. Please take as directed and contact our office if you believe you are having problem(s) with the medication(s).  A referral was ordered for cardiology  Please followup in 8 months

## 2019-07-18 ENCOUNTER — Other Ambulatory Visit (INDEPENDENT_AMBULATORY_CARE_PROVIDER_SITE_OTHER): Payer: PPO

## 2019-07-18 ENCOUNTER — Other Ambulatory Visit: Payer: Self-pay

## 2019-07-18 ENCOUNTER — Ambulatory Visit (INDEPENDENT_AMBULATORY_CARE_PROVIDER_SITE_OTHER): Payer: PPO | Admitting: Internal Medicine

## 2019-07-18 ENCOUNTER — Encounter: Payer: Self-pay | Admitting: Internal Medicine

## 2019-07-18 VITALS — BP 108/72 | HR 74 | Temp 97.7°F | Resp 16

## 2019-07-18 DIAGNOSIS — K219 Gastro-esophageal reflux disease without esophagitis: Secondary | ICD-10-CM

## 2019-07-18 DIAGNOSIS — R7303 Prediabetes: Secondary | ICD-10-CM

## 2019-07-18 DIAGNOSIS — E7849 Other hyperlipidemia: Secondary | ICD-10-CM

## 2019-07-18 DIAGNOSIS — C450 Mesothelioma of pleura: Secondary | ICD-10-CM | POA: Diagnosis not present

## 2019-07-18 DIAGNOSIS — E038 Other specified hypothyroidism: Secondary | ICD-10-CM | POA: Diagnosis not present

## 2019-07-18 DIAGNOSIS — F3289 Other specified depressive episodes: Secondary | ICD-10-CM | POA: Diagnosis not present

## 2019-07-18 DIAGNOSIS — M5416 Radiculopathy, lumbar region: Secondary | ICD-10-CM

## 2019-07-18 DIAGNOSIS — R55 Syncope and collapse: Secondary | ICD-10-CM | POA: Diagnosis not present

## 2019-07-18 DIAGNOSIS — G3184 Mild cognitive impairment, so stated: Secondary | ICD-10-CM | POA: Diagnosis not present

## 2019-07-18 DIAGNOSIS — E119 Type 2 diabetes mellitus without complications: Secondary | ICD-10-CM | POA: Insufficient documentation

## 2019-07-18 LAB — HEMOGLOBIN A1C: Hgb A1c MFr Bld: 6.8 % — ABNORMAL HIGH (ref 4.6–6.5)

## 2019-07-18 LAB — TSH: TSH: 0.84 u[IU]/mL (ref 0.35–4.50)

## 2019-07-18 MED ORDER — GABAPENTIN 300 MG PO CAPS: ORAL_CAPSULE | ORAL | 1 refills | Status: AC

## 2019-07-18 NOTE — Assessment & Plan Note (Signed)
Stable, controlled Continue Lexapro 20 mg daily

## 2019-07-18 NOTE — Assessment & Plan Note (Signed)
Taking gabapentin, which does help We will continue

## 2019-07-18 NOTE — Assessment & Plan Note (Signed)
Appears to be euthyroid Check TSH We will adjust medication if needed

## 2019-07-18 NOTE — Assessment & Plan Note (Signed)
Check A1c. 

## 2019-07-18 NOTE — Assessment & Plan Note (Signed)
GERD controlled Continue daily medication-omeprazole daily

## 2019-07-18 NOTE — Assessment & Plan Note (Signed)
Following with Dr. Tomi Likens Taking Aricept Continue

## 2019-07-18 NOTE — Assessment & Plan Note (Addendum)
He did have a syncopal episode-his daughter was unsure if it was a seizure or not, but there is no postictal confusion and he has had a few presyncopal-like episodes ?  Related to hypotension/vasovagal.  After the syncope episode his blood pressure was not low Advised his daughter to start checking his sugar, oxygen saturation and blood pressure more regularly, especially if he is symptomatic or has 1 of these episodes Will refer to cardiology Blood pressure is variable-push fluids-not currently on any blood pressure medication ?  Related to chemotherapy

## 2019-07-18 NOTE — Assessment & Plan Note (Signed)
Continue Crestor Check lipid panel, CMP

## 2019-07-18 NOTE — Assessment & Plan Note (Addendum)
Following with oncology receiving chemotherapy, which he is tolerating well

## 2019-07-19 ENCOUNTER — Telehealth: Payer: Self-pay

## 2019-07-19 DIAGNOSIS — E119 Type 2 diabetes mellitus without complications: Secondary | ICD-10-CM

## 2019-07-19 MED ORDER — BLOOD GLUCOSE MONITOR KIT: PACK | 0 refills | Status: AC

## 2019-07-19 NOTE — Telephone Encounter (Signed)
Referral ordered

## 2019-07-19 NOTE — Telephone Encounter (Signed)
Pts daughter wants a referral to nutritionist to know what to cook and give her dad since he is a new diabetic.

## 2019-07-20 ENCOUNTER — Inpatient Hospital Stay: Payer: PPO

## 2019-07-20 ENCOUNTER — Telehealth: Payer: Self-pay | Admitting: *Deleted

## 2019-07-20 ENCOUNTER — Other Ambulatory Visit: Payer: Self-pay

## 2019-07-20 ENCOUNTER — Telehealth: Payer: Self-pay | Admitting: Internal Medicine

## 2019-07-20 DIAGNOSIS — C450 Mesothelioma of pleura: Secondary | ICD-10-CM

## 2019-07-20 DIAGNOSIS — Z5112 Encounter for antineoplastic immunotherapy: Secondary | ICD-10-CM | POA: Diagnosis not present

## 2019-07-20 LAB — CMP (CANCER CENTER ONLY)
ALT: 28 U/L (ref 0–44)
AST: 32 U/L (ref 15–41)
Albumin: 3.5 g/dL (ref 3.5–5.0)
Alkaline Phosphatase: 73 U/L (ref 38–126)
Anion gap: 12 (ref 5–15)
BUN: 26 mg/dL — ABNORMAL HIGH (ref 8–23)
CO2: 24 mmol/L (ref 22–32)
Calcium: 9.2 mg/dL (ref 8.9–10.3)
Chloride: 100 mmol/L (ref 98–111)
Creatinine: 1.03 mg/dL (ref 0.61–1.24)
GFR, Est AFR Am: >60 mL/min (ref >60)
GFR, Estimated: >60 mL/min (ref >60)
Glucose, Bld: 108 mg/dL — ABNORMAL HIGH (ref 70–99)
Potassium: 3.7 mmol/L (ref 3.5–5.1)
Sodium: 136 mmol/L (ref 135–145)
Total Bilirubin: 1.9 mg/dL — ABNORMAL HIGH (ref 0.3–1.2)
Total Protein: 6.5 g/dL (ref 6.5–8.1)

## 2019-07-20 LAB — CBC WITH DIFFERENTIAL (CANCER CENTER ONLY)
Abs Immature Granulocytes: 0.03 10*3/uL (ref 0.00–0.07)
Basophils Absolute: 0 10*3/uL (ref 0.0–0.1)
Basophils Relative: 0 %
Eosinophils Absolute: 0.1 10*3/uL (ref 0.0–0.5)
Eosinophils Relative: 1 %
HCT: 42.9 % (ref 39.0–52.0)
Hemoglobin: 14.9 g/dL (ref 13.0–17.0)
Immature Granulocytes: 1 %
Lymphocytes Relative: 25 %
Lymphs Abs: 1.1 10*3/uL (ref 0.7–4.0)
MCH: 33.3 pg (ref 26.0–34.0)
MCHC: 34.7 g/dL (ref 30.0–36.0)
MCV: 96 fL (ref 80.0–100.0)
Monocytes Absolute: 0.5 10*3/uL (ref 0.1–1.0)
Monocytes Relative: 10 %
Neutro Abs: 2.8 10*3/uL (ref 1.7–7.7)
Neutrophils Relative %: 63 %
Platelet Count: 77 10*3/uL — ABNORMAL LOW (ref 150–400)
RBC: 4.47 MIL/uL (ref 4.22–5.81)
RDW: 12.8 % (ref 11.5–15.5)
WBC Count: 4.5 10*3/uL (ref 4.0–10.5)
nRBC: 0 % (ref 0.0–0.2)

## 2019-07-20 NOTE — Telephone Encounter (Signed)
Received vm message from pt's daughter inquiring about his lab work for today.  Reviewed the results from today. She voiced understanding.  She also wanted to make Dr. Julien Nordmann aware that he has been diagnosed with Diabetes and that a referral has been made to cardiology per his PCP.  Reviewed upcoming appts as well.

## 2019-07-20 NOTE — Telephone Encounter (Signed)
Copied from [place] [id number]. Topic: Quick Communication - Rx Refill/Question >> Jul 20, 2019  4:50 PM Erick Blinks wrote: Medication: OneTouch Verio Lancets and Test strips needed!   Has the patient contacted their pharmacy? Yes.   (Agent: If no, request that the patient contact the pharmacy for the refill.) (Agent: If yes, when and what did the pharmacy advise?)  Preferred Pharmacy (with phone number or street name): [facility], [place] [address] Phone: [phone] Fax: [phone]    Agent: Please be advised that RX refills may take up to 3 business days. We ask that you follow-up with your pharmacy.

## 2019-07-21 MED ORDER — LANCETS 28G MISC: 1 refills | Status: AC

## 2019-07-21 MED ORDER — COOL BLOOD GLUCOSE TEST STRIPS VI STRP: ORAL_STRIP | 1 refills | Status: AC

## 2019-07-21 NOTE — Telephone Encounter (Signed)
Rx sent 

## 2019-07-27 ENCOUNTER — Inpatient Hospital Stay: Payer: PPO

## 2019-07-27 ENCOUNTER — Other Ambulatory Visit: Payer: Self-pay

## 2019-07-27 DIAGNOSIS — C450 Mesothelioma of pleura: Secondary | ICD-10-CM

## 2019-07-27 DIAGNOSIS — Z5112 Encounter for antineoplastic immunotherapy: Secondary | ICD-10-CM | POA: Diagnosis not present

## 2019-07-27 LAB — CMP (CANCER CENTER ONLY)
ALT: 29 U/L (ref 0–44)
AST: 48 U/L — ABNORMAL HIGH (ref 15–41)
Albumin: 3.6 g/dL (ref 3.5–5.0)
Alkaline Phosphatase: 69 U/L (ref 38–126)
Anion gap: 11 (ref 5–15)
BUN: 20 mg/dL (ref 8–23)
CO2: 23 mmol/L (ref 22–32)
Calcium: 9 mg/dL (ref 8.9–10.3)
Chloride: 106 mmol/L (ref 98–111)
Creatinine: 1.12 mg/dL (ref 0.61–1.24)
GFR, Est AFR Am: >60 mL/min (ref >60)
GFR, Estimated: >60 mL/min (ref >60)
Glucose, Bld: 111 mg/dL — ABNORMAL HIGH (ref 70–99)
Potassium: 3.2 mmol/L — ABNORMAL LOW (ref 3.5–5.1)
Sodium: 140 mmol/L (ref 135–145)
Total Bilirubin: 1 mg/dL (ref 0.3–1.2)
Total Protein: 6.4 g/dL — ABNORMAL LOW (ref 6.5–8.1)

## 2019-07-27 LAB — CBC WITH DIFFERENTIAL (CANCER CENTER ONLY)
Abs Immature Granulocytes: 0.03 10*3/uL (ref 0.00–0.07)
Basophils Absolute: 0 10*3/uL (ref 0.0–0.1)
Basophils Relative: 1 %
Eosinophils Absolute: 0.1 10*3/uL (ref 0.0–0.5)
Eosinophils Relative: 3 %
HCT: 39.3 % (ref 39.0–52.0)
Hemoglobin: 13.6 g/dL (ref 13.0–17.0)
Immature Granulocytes: 1 %
Lymphocytes Relative: 44 %
Lymphs Abs: 1.6 10*3/uL (ref 0.7–4.0)
MCH: 34 pg (ref 26.0–34.0)
MCHC: 34.6 g/dL (ref 30.0–36.0)
MCV: 98.3 fL (ref 80.0–100.0)
Monocytes Absolute: 0.5 10*3/uL (ref 0.1–1.0)
Monocytes Relative: 13 %
Neutro Abs: 1.4 10*3/uL — ABNORMAL LOW (ref 1.7–7.7)
Neutrophils Relative %: 38 %
Platelet Count: 83 10*3/uL — ABNORMAL LOW (ref 150–400)
RBC: 4 MIL/uL — ABNORMAL LOW (ref 4.22–5.81)
RDW: 13.6 % (ref 11.5–15.5)
WBC Count: 3.5 10*3/uL — ABNORMAL LOW (ref 4.0–10.5)
nRBC: 0 % (ref 0.0–0.2)

## 2019-07-28 ENCOUNTER — Telehealth: Payer: Self-pay | Admitting: Medical Oncology

## 2019-07-28 ENCOUNTER — Telehealth: Payer: Self-pay

## 2019-07-28 NOTE — Telephone Encounter (Signed)
Copied from [place] [phone]. Topic: General - Other >> Jul 28, 2019  4:29 PM Berneta Levins wrote: Reason for CRM:   Pt's daughter, Cecille Rubin calling.  States that she was just told her dad was diabetic but not given any instructions on what to do, how often to test blood sugar - what is too high and too low and what to do.  Pt's daughter would like to speak with a nurse so she can understand what sort of care she needs to be providing.

## 2019-07-28 NOTE — Telephone Encounter (Signed)
Pt needs assist to radiology -I gave dtr the phone  number to radiology. Labs reviewed with dtr.

## 2019-07-28 NOTE — Telephone Encounter (Signed)
She can check his sugars if she wants, but does not have to.  His sugars are still controlled and by changing his diet will most likely help him avoid medication.  If she wants to check her sugars once a day she should check them first thing in the morning before he has eaten anything.  This is a fasting sugar and normal is 70-99.  Less than 126 is very good.  She should also check his sugar as needed if he feels funny or randomly throughout the day just to get an idea of what his sugars are.  She can call with her sugar numbers if she has any questions  If the sugar is less than 80 he should eat something.  Peanut butter crackers or have some juice.  Benefits more than 200 frequently she should call.

## 2019-07-31 NOTE — Telephone Encounter (Signed)
Pts daughter was asleep. Advised that she call back when she could to go over things below.

## 2019-08-01 ENCOUNTER — Ambulatory Visit (HOSPITAL_COMMUNITY)
Admission: RE | Admit: 2019-08-01 | Discharge: 2019-08-01 | Disposition: A | Discharge status: Home / Self Care | Payer: PPO | Source: Ambulatory Visit | Attending: Internal Medicine | Admitting: Internal Medicine

## 2019-08-01 ENCOUNTER — Encounter (HOSPITAL_COMMUNITY): Payer: Self-pay

## 2019-08-01 ENCOUNTER — Other Ambulatory Visit: Payer: Self-pay

## 2019-08-01 DIAGNOSIS — R918 Other nonspecific abnormal finding of lung field: Secondary | ICD-10-CM | POA: Diagnosis not present

## 2019-08-01 DIAGNOSIS — C450 Mesothelioma of pleura: Secondary | ICD-10-CM | POA: Insufficient documentation

## 2019-08-01 DIAGNOSIS — Z85831 Personal history of malignant neoplasm of soft tissue: Secondary | ICD-10-CM | POA: Diagnosis not present

## 2019-08-01 DIAGNOSIS — K5730 Diverticulosis of large intestine without perforation or abscess without bleeding: Secondary | ICD-10-CM | POA: Diagnosis not present

## 2019-08-01 MED ORDER — HEPARIN SOD (PORK) LOCK FLUSH 100 UNIT/ML IV SOLN
INTRAVENOUS | Status: AC
  Filled 2019-08-01: qty 5

## 2019-08-01 MED ORDER — HEPARIN SOD (PORK) LOCK FLUSH 100 UNIT/ML IV SOLN
500.0000 [IU] | Freq: Once | INTRAVENOUS | Status: AC
  Administered 2019-08-01: 15:00:00 500 [IU] via INTRAVENOUS

## 2019-08-01 MED ORDER — IOHEXOL 300 MG/ML  SOLN
100.0000 mL | Freq: Once | INTRAMUSCULAR | Status: AC | PRN
  Administered 2019-08-01: 100 mL via INTRAVENOUS

## 2019-08-01 MED ORDER — SODIUM CHLORIDE (PF) 0.9 % IJ SOLN
INTRAMUSCULAR | Status: AC
  Filled 2019-08-01: qty 50

## 2019-08-01 NOTE — Telephone Encounter (Signed)
Daughter aware of response below and expressed understanding.

## 2019-08-03 ENCOUNTER — Encounter: Payer: Self-pay | Admitting: Physician Assistant

## 2019-08-03 ENCOUNTER — Inpatient Hospital Stay: Payer: PPO | Admitting: Physician Assistant

## 2019-08-03 ENCOUNTER — Inpatient Hospital Stay: Payer: PPO

## 2019-08-03 ENCOUNTER — Telehealth: Payer: Self-pay | Admitting: Neurology

## 2019-08-03 ENCOUNTER — Other Ambulatory Visit: Payer: Self-pay

## 2019-08-03 VITALS — BP 120/92 | HR 67 | Temp 98.5°F | Resp 18 | Ht 72.0 in | Wt 192.5 lb

## 2019-08-03 DIAGNOSIS — C450 Mesothelioma of pleura: Secondary | ICD-10-CM | POA: Diagnosis not present

## 2019-08-03 DIAGNOSIS — R410 Disorientation, unspecified: Secondary | ICD-10-CM

## 2019-08-03 DIAGNOSIS — Z5111 Encounter for antineoplastic chemotherapy: Secondary | ICD-10-CM

## 2019-08-03 DIAGNOSIS — F0390 Unspecified dementia without behavioral disturbance: Secondary | ICD-10-CM

## 2019-08-03 DIAGNOSIS — Z5112 Encounter for antineoplastic immunotherapy: Secondary | ICD-10-CM | POA: Diagnosis not present

## 2019-08-03 DIAGNOSIS — G3184 Mild cognitive impairment, so stated: Secondary | ICD-10-CM

## 2019-08-03 DIAGNOSIS — R413 Other amnesia: Secondary | ICD-10-CM

## 2019-08-03 DIAGNOSIS — F0150 Vascular dementia without behavioral disturbance: Secondary | ICD-10-CM

## 2019-08-03 DIAGNOSIS — E538 Deficiency of other specified B group vitamins: Secondary | ICD-10-CM

## 2019-08-03 LAB — CBC WITH DIFFERENTIAL (CANCER CENTER ONLY)
Abs Immature Granulocytes: 0.02 10*3/uL (ref 0.00–0.07)
Basophils Absolute: 0 10*3/uL (ref 0.0–0.1)
Basophils Relative: 0 %
Eosinophils Absolute: 0 10*3/uL (ref 0.0–0.5)
Eosinophils Relative: 0 %
HCT: 39.9 % (ref 39.0–52.0)
Hemoglobin: 13.8 g/dL (ref 13.0–17.0)
Immature Granulocytes: 1 %
Lymphocytes Relative: 21 %
Lymphs Abs: 0.8 10*3/uL (ref 0.7–4.0)
MCH: 34.2 pg — ABNORMAL HIGH (ref 26.0–34.0)
MCHC: 34.6 g/dL (ref 30.0–36.0)
MCV: 99 fL (ref 80.0–100.0)
Monocytes Absolute: 0.6 10*3/uL (ref 0.1–1.0)
Monocytes Relative: 16 %
Neutro Abs: 2.4 10*3/uL (ref 1.7–7.7)
Neutrophils Relative %: 62 %
Platelet Count: 187 10*3/uL (ref 150–400)
RBC: 4.03 MIL/uL — ABNORMAL LOW (ref 4.22–5.81)
RDW: 15 % (ref 11.5–15.5)
WBC Count: 3.8 10*3/uL — ABNORMAL LOW (ref 4.0–10.5)
nRBC: 0 % (ref 0.0–0.2)

## 2019-08-03 LAB — CMP (CANCER CENTER ONLY)
ALT: 22 U/L (ref 0–44)
AST: 34 U/L (ref 15–41)
Albumin: 3.9 g/dL (ref 3.5–5.0)
Alkaline Phosphatase: 68 U/L (ref 38–126)
Anion gap: 10 (ref 5–15)
BUN: 22 mg/dL (ref 8–23)
CO2: 23 mmol/L (ref 22–32)
Calcium: 9.2 mg/dL (ref 8.9–10.3)
Chloride: 107 mmol/L (ref 98–111)
Creatinine: 1.35 mg/dL — ABNORMAL HIGH (ref 0.61–1.24)
GFR, Est AFR Am: 56 mL/min — ABNORMAL LOW (ref >60)
GFR, Estimated: 48 mL/min — ABNORMAL LOW (ref >60)
Glucose, Bld: 134 mg/dL — ABNORMAL HIGH (ref 70–99)
Potassium: 4.2 mmol/L (ref 3.5–5.1)
Sodium: 140 mmol/L (ref 135–145)
Total Bilirubin: 0.8 mg/dL (ref 0.3–1.2)
Total Protein: 6.7 g/dL (ref 6.5–8.1)

## 2019-08-03 LAB — TOTAL PROTEIN, URINE DIPSTICK: Protein, ur: 30 mg/dL — AB

## 2019-08-03 MED ORDER — SODIUM CHLORIDE 0.9 % IV SOLN
470.0000 mg/m2 | Freq: Once | INTRAVENOUS | Status: AC
  Administered 2019-08-03: 16:00:00 1000 mg via INTRAVENOUS
  Filled 2019-08-03: qty 40

## 2019-08-03 MED ORDER — SODIUM CHLORIDE 0.9 % IV SOLN
396.0000 mg | Freq: Once | INTRAVENOUS | Status: AC
  Administered 2019-08-03: 16:00:00 400 mg via INTRAVENOUS
  Filled 2019-08-03: qty 40

## 2019-08-03 MED ORDER — SODIUM CHLORIDE 0.9% FLUSH
10.0000 mL | INTRAVENOUS | Status: DC | PRN
  Administered 2019-08-03: 17:00:00 10 mL
  Filled 2019-08-03: qty 10

## 2019-08-03 MED ORDER — SODIUM CHLORIDE 0.9 % IV SOLN
Freq: Once | INTRAVENOUS | Status: AC
  Administered 2019-08-03: 14:00:00 via INTRAVENOUS
  Filled 2019-08-03: qty 250

## 2019-08-03 MED ORDER — SODIUM CHLORIDE 0.9 % IV SOLN
15.0000 mg/kg | Freq: Once | INTRAVENOUS | Status: AC
  Administered 2019-08-03: 15:00:00 1400 mg via INTRAVENOUS
  Filled 2019-08-03: qty 48

## 2019-08-03 MED ORDER — PALONOSETRON HCL INJECTION 0.25 MG/5ML
0.2500 mg | Freq: Once | INTRAVENOUS | Status: AC
  Administered 2019-08-03: 14:00:00 0.25 mg via INTRAVENOUS

## 2019-08-03 MED ORDER — SODIUM CHLORIDE 0.9 % IV SOLN
Freq: Once | INTRAVENOUS | Status: AC
  Administered 2019-08-03: 14:00:00 via INTRAVENOUS
  Filled 2019-08-03: qty 5

## 2019-08-03 MED ORDER — HEPARIN SOD (PORK) LOCK FLUSH 100 UNIT/ML IV SOLN
500.0000 [IU] | Freq: Once | INTRAVENOUS | Status: AC | PRN
  Administered 2019-08-03: 17:00:00 500 [IU]
  Filled 2019-08-03: qty 5

## 2019-08-03 MED ORDER — PALONOSETRON HCL INJECTION 0.25 MG/5ML
INTRAVENOUS | Status: AC
  Filled 2019-08-03: qty 5

## 2019-08-03 NOTE — Patient Instructions (Signed)
[facility] Discharge Instructions for Patients Receiving Chemotherapy  Today you received the following chemotherapy agents: Avastin, Alimta, Carboplatin.  To help prevent nausea and vomiting after your treatment, we encourage you to take your nausea medication as directed.   If you develop nausea and vomiting that is not controlled by your nausea medication, call the clinic.   BELOW ARE SYMPTOMS THAT SHOULD BE REPORTED IMMEDIATELY:  *FEVER GREATER THAN 100.5 F  *CHILLS WITH OR WITHOUT FEVER  NAUSEA AND VOMITING THAT IS NOT CONTROLLED WITH YOUR NAUSEA MEDICATION  *UNUSUAL SHORTNESS OF BREATH  *UNUSUAL BRUISING OR BLEEDING  TENDERNESS IN MOUTH AND THROAT WITH OR WITHOUT PRESENCE OF ULCERS  *URINARY PROBLEMS  *BOWEL PROBLEMS  UNUSUAL RASH Items with * indicate a potential emergency and should be followed up as soon as possible.  Feel free to call the clinic should you have any questions or concerns. The clinic phone number is [phone].  Please show the [place] [facility] at check-in to the Emergency Department and triage nurse.

## 2019-08-03 NOTE — Progress Notes (Signed)
[facility] OFFICE PROGRESS NOTE  [name], MD [place] [address]  DIAGNOSIS: Stage II malignant pleural mesothelioma, epithelioid type involving the left hemithorax diagnosed in March 2018.  PRIOR THERAPY:  1) Status post left VATS with biopsy and wedge resection of the left lower lobe as well as parietal pleurectomy under the care of Dr. Roxan Hockey on 02/15/2017. 2) Palliative systemic chemotherapy with carboplatin for AUC of 5, Alimta 500 MG/M2 and Avastin 15 MG/KG every 3 weeks. Status post 6 cycles.  The patient has been on observation for almost 2 years.  CURRENT THERAPY: Resuming systemic chemotherapy with carboplatin for AUC of 5, Alimta 500 mg/M2 and Avastin 15 mg/KG every 3 weeks.  First dose May 31, 2019.  Status post 3 cycles.  INTERVAL HISTORY: Dean Pruitt 85 y.o. male returns to the clinic for a follow up visit. The patient is feeling well today without any concerning complaints except for occasional chest discomfort which is exacerbated by taking a deep breath. The patient feels the discomfort on the left lower side of his chest wall near the location of his malignancy. The patient continues to tolerate treatment with carboplatin, alimta, and avastin well without any adverse side effects. Denies any fever, chills, night sweats, or weight loss. Denies any shortness of breath, cough, or hemoptysis. Denies any nausea, vomiting, diarrhea, or constipation. Denies any headache or visual changes. Denies any rashes or skin changes. The patient recently had a restaging CT scan performed. The patient is here today for evaluation prior to starting cycle #4  MEDICAL HISTORY: Past Medical History:  Diagnosis Date  . Cataract   . Depression    situational  . GERD (gastroesophageal reflux disease)   . Goals of care, counseling/discussion 03/20/2017  . Hyperlipidemia   . Hypothyroidism   . Reactive depression 03/20/2017  . Thyroid cancer ([facility])     PMH of; on supressive therapy    ALLERGIES:  is allergic to ativan [lorazepam]; fentanyl; and tramadol.  MEDICATIONS:  Current Outpatient Medications  Medication Sig Dispense Refill  . aspirin 81 MG tablet Take 81 mg by mouth daily.    . bifidobacterium infantis (ALIGN) capsule Take by mouth.    . blood glucose meter kit and supplies KIT Use to check blood sugars daily. Dx Code-E11.9 1 each 0  . dexamethasone (DECADRON) 4 MG tablet 1 tablet p.o. twice daily the day before, day of and day after chemotherapy every 3 weeks. 40 tablet 1  . donepezil (ARICEPT) 10 MG tablet TAKE 1 TABLET BY MOUTH AT BEDTIME 90 tablet 0  . escitalopram (LEXAPRO) 20 MG tablet TAKE 1 TABLET BY MOUTH ONCE DAILY AT BEDTIME 90 tablet 0  . folic acid (FOLVITE) 1 MG tablet Take 1 tablet (1 mg total) by mouth daily. 30 tablet 2  . gabapentin (NEURONTIN) 300 MG capsule TAKE 1 CAPSULE BY MOUTH AT NIGHT 90 capsule 1  . glucose blood (COOL BLOOD GLUCOSE TEST STRIPS) test strip Use to check blood sugars daily. Dx code: E11.9 100 each 1  . Lancets 28G MISC Use to check blood sugars daily. Dx E11.9 100 each 1  . levothyroxine (SYNTHROID, LEVOTHROID) 150 MCG tablet TAKE 1 TABLET BY MOUTH ONCE DAILY 90 tablet 1  . lidocaine-prilocaine (EMLA) cream Apply 1 application topically as needed. 30 g 0  . meloxicam (MOBIC) 7.5 MG tablet Take 1 tablet by mouth once daily 90 tablet 0  . omeprazole (PRILOSEC) 20 MG capsule Take 1 capsule (20  mg total) by mouth daily. 90 capsule 1  . prochlorperazine (COMPAZINE) 10 MG tablet Take 1 tablet (10 mg total) by mouth every 6 (six) hours as needed for nausea or vomiting. 30 tablet 0  . rosuvastatin (CRESTOR) 20 MG tablet Take 1 tablet by mouth once daily 90 tablet 0   No current facility-administered medications for this visit.    Facility-Administered Medications Ordered in Other Visits  Medication Dose Route Frequency Provider Last Rate Last Dose  . CARBOplatin (PARAPLATIN) 400 mg in sodium  chloride 0.9 % 250 mL chemo infusion  400 mg Intravenous Once Curt Bears, MD      . heparin lock flush 100 unit/mL  500 Units Intracatheter Once PRN Curt Bears, MD      . sodium chloride flush (NS) 0.9 % injection 10 mL  10 mL Intracatheter PRN Curt Bears, MD        SURGICAL HISTORY:  Past Surgical History:  Procedure Laterality Date  . CATARACT EXTRACTION, BILATERAL  2013 & 2014   Dr Celene Squibb  . colon polyps  2002 & 2005   negative 2008; Dr Earlean Shawl  . COLONOSCOPY W/ POLYPECTOMY    . LUNG BIOPSY Left 02/15/2017   Procedure: LUNG BIOPSY;  Surgeon: Melrose Nakayama, MD;  Location: [facility];  Service: Thoracic;  Laterality: Left;  . PLEURAL EFFUSION DRAINAGE Left 02/15/2017   Procedure: DRAINAGE OF PLEURAL EFFUSION;  Surgeon: Melrose Nakayama, MD;  Location: [facility];  Service: Thoracic;  Laterality: Left;  . PORTACATH PLACEMENT N/A 03/25/2017   Procedure: INSERTION PORT-A-CATH, right internal jugular;  Surgeon: Melrose Nakayama, MD;  Location: [facility];  Service: Thoracic;  Laterality: N/A;  . THYROIDECTOMY  2001   S/P RAI  . VIDEO ASSISTED THORACOSCOPY Left 02/15/2017   Procedure: VIDEO ASSISTED THORACOSCOPY;  Surgeon: Melrose Nakayama, MD;  Location: [facility];  Service: Thoracic;  Laterality: Left;    REVIEW OF SYSTEMS:   Review of Systems  Constitutional: Negative for appetite change, chills, fatigue, fever and unexpected weight change.  HENT:   Negative for mouth sores, nosebleeds, sore throat and trouble swallowing.   Eyes: Negative for eye problems and icterus.  Respiratory: Negative for cough, hemoptysis, shortness of breath and wheezing.   Cardiovascular: Positive for occasional left lower anterior chest discomfort with inhalation. Negative for leg swelling.  Gastrointestinal: Negative for abdominal pain, constipation, diarrhea, nausea and vomiting.  Genitourinary: Negative for bladder incontinence, difficulty urinating, dysuria, frequency and hematuria.    Musculoskeletal: Negative for back pain, gait problem, neck pain and neck stiffness.  Skin: Negative for itching and rash.  Neurological: Negative for dizziness, extremity weakness, gait problem, headaches, light-headedness and seizures.  Hematological: Negative for adenopathy. Does not bruise/bleed easily.  Psychiatric/Behavioral: Negative for confusion, depression and sleep disturbance. The patient is not nervous/anxious.     PHYSICAL EXAMINATION:  Blood pressure (!) 120/92, pulse 67, temperature 98.5 F (36.9 C), temperature source Oral, resp. rate 18, height 6' (1.829 m), weight 192 lb 8 oz (87.3 kg), SpO2 95 %.  ECOG PERFORMANCE STATUS: 1 - Symptomatic but completely ambulatory  Physical Exam  Constitutional: Oriented to person, place, and time and well-developed, well-nourished, and in no distress.  HENT:  Head: Normocephalic and atraumatic.  Mouth/Throat: Oropharynx is clear and moist. No oropharyngeal exudate.  Eyes: Conjunctivae are normal. Right eye exhibits no discharge. Left eye exhibits no discharge. No scleral icterus.  Neck: Normal range of motion. Neck supple.  Cardiovascular: Normal rate, regular rhythm, normal heart sounds and intact  distal pulses.   Pulmonary/Chest: Effort normal and breath sounds normal. No respiratory distress. No wheezes. No rales.  Abdominal: Soft. Bowel sounds are normal. Exhibits no distension and no mass. There is no tenderness.  Musculoskeletal: Normal range of motion. Exhibits no edema.  Lymphadenopathy:    No cervical adenopathy.  Neurological: Alert and oriented to person, place, and time. Exhibits normal muscle tone. Gait normal. Coordination normal.  Skin: Skin is warm and dry. No rash noted. Not diaphoretic. No erythema. No pallor.  Psychiatric: Mood, memory and judgment normal.  Vitals reviewed.  LABORATORY DATA: Lab Results  Component Value Date   WBC 3.8 (L) 08/03/2019   HGB 13.8 08/03/2019   HCT 39.9 08/03/2019   MCV 99.0  08/03/2019   PLT 187 08/03/2019      Chemistry      Component Value Date/Time   NA 140 08/03/2019 1216   NA 140 11/04/2017 1210   K 4.2 08/03/2019 1216   K 4.1 11/04/2017 1210   CL 107 08/03/2019 1216   CO2 23 08/03/2019 1216   CO2 26 11/04/2017 1210   BUN 22 08/03/2019 1216   BUN 20.1 11/04/2017 1210   CREATININE 1.35 (H) 08/03/2019 1216   CREATININE 1.2 11/04/2017 1210      Component Value Date/Time   CALCIUM 9.2 08/03/2019 1216   CALCIUM 9.6 11/04/2017 1210   ALKPHOS 68 08/03/2019 1216   ALKPHOS 62 11/04/2017 1210   AST 34 08/03/2019 1216   AST 37 (H) 11/04/2017 1210   ALT 22 08/03/2019 1216   ALT 22 11/04/2017 1210   BILITOT 0.8 08/03/2019 1216   BILITOT 1.32 (H) 11/04/2017 1210       RADIOGRAPHIC STUDIES:  Ct Chest W Contrast  Result Date: 08/01/2019 CLINICAL DATA:  Patient with history of mesothelioma diagnosed in 2018 status post chemotherapy. EXAM: CT CHEST, ABDOMEN, AND PELVIS WITH CONTRAST TECHNIQUE: Multidetector CT imaging of the chest, abdomen and pelvis was performed following the standard protocol during bolus administration of intravenous contrast. CONTRAST:  147m OMNIPAQUE IOHEXOL 300 MG/ML  SOLN COMPARISON:  CT chest 05/19/2019; CT CAP 02/04/2018 FINDINGS: CT CHEST FINDINGS Cardiovascular: Right anterior chest wall Port-A-Cath is present with tip terminating in the superior vena cava. Heart is mildly enlarged. Trace fluid superior pericardial recess. Thoracic aortic vascular calcifications. Similar-appearing dilation of the ascending thoracic aorta measuring 4.2 cm. Mediastinum/Nodes: No enlarged axillary, mediastinal or hilar lymphadenopathy. Normal appearance of the esophagus. Lungs/Pleura: Central airways are patent. Similar-appearing peripheral scarring within the lingula and left lower lobe. No large area pulmonary consolidation. No pleural effusion or pneumothorax. Interval decrease in size of 7 mm pleural based nodule left upper hemithorax, previously 9  mm (image 12; series 2). Interval decrease in size of 6 mm pleural based nodule along the superior mediastinum (image 21; series 2), previously 8 mm. Interval decrease in size of 13 x 7 mm pleural-based nodule along the mediastinum (image 32; series 2), previously 18 x 11 mm. Interval decrease in size of 1.3 cm lesion adjacent to the cardiac ventricle (image 44; series 2), previously 2.5 cm. Interval decrease in size of nodularity along the left fissure measuring approximately 0.7 cm (image 27; series 2), previously 2.2 cm. Musculoskeletal: Thoracic spine degenerative changes. No aggressive or acute appearing osseous lesions. CT ABDOMEN PELVIS FINDINGS Hepatobiliary: The liver is normal in size and contour. No focal hepatic lesion is identified. Gallbladder is unremarkable. No intrahepatic or extrahepatic biliary ductal dilatation. Pancreas: Unremarkable Spleen: Unremarkable Adrenals/Urinary Tract: Normal adrenal glands. Kidneys enhance  symmetrically with contrast. No hydronephrosis. Urinary bladder is unremarkable. Stomach/Bowel: Sigmoid colonic diverticulosis. No CT evidence for acute diverticulitis. Normal appendix. No evidence for bowel obstruction. No free fluid or free intraperitoneal air. Normal morphology of the stomach. Vascular/Lymphatic: Infrarenal abdominal aorta measures 3.0 cm. No retroperitoneal lymphadenopathy. Reproductive: Heterogeneous prostate. Other: Small bilateral fat containing inguinal hernias. Musculoskeletal: Lumbar spine degenerative changes. No aggressive or acute appearing osseous lesions. IMPRESSION: 1. Interval decrease in size of pleural based nodularity within the left hemithorax. 2. No evidence for metastasis within the abdomen or pelvis. 3. Abdominal aorta measures 3.0 cm. Recommend followup by ultrasound in 3 years. This recommendation follows ACR consensus guidelines: White Paper of the ACR Incidental Findings Committee II on Vascular Findings. J Am Coll Radiol 2013; 10:789-794.  Aortic aneurysm NOS (ICD10-I71.9) 4. Similar-appearing dilated ascending thoracic aorta. Recommend attention on follow-up. Electronically Signed   By: Lovey Newcomer M.D.   On: 08/01/2019 16:20   Ct Abdomen Pelvis W Contrast  Result Date: 08/01/2019 CLINICAL DATA:  Patient with history of mesothelioma diagnosed in 2018 status post chemotherapy. EXAM: CT CHEST, ABDOMEN, AND PELVIS WITH CONTRAST TECHNIQUE: Multidetector CT imaging of the chest, abdomen and pelvis was performed following the standard protocol during bolus administration of intravenous contrast. CONTRAST:  128m OMNIPAQUE IOHEXOL 300 MG/ML  SOLN COMPARISON:  CT chest 05/19/2019; CT CAP 02/04/2018 FINDINGS: CT CHEST FINDINGS Cardiovascular: Right anterior chest wall Port-A-Cath is present with tip terminating in the superior vena cava. Heart is mildly enlarged. Trace fluid superior pericardial recess. Thoracic aortic vascular calcifications. Similar-appearing dilation of the ascending thoracic aorta measuring 4.2 cm. Mediastinum/Nodes: No enlarged axillary, mediastinal or hilar lymphadenopathy. Normal appearance of the esophagus. Lungs/Pleura: Central airways are patent. Similar-appearing peripheral scarring within the lingula and left lower lobe. No large area pulmonary consolidation. No pleural effusion or pneumothorax. Interval decrease in size of 7 mm pleural based nodule left upper hemithorax, previously 9 mm (image 12; series 2). Interval decrease in size of 6 mm pleural based nodule along the superior mediastinum (image 21; series 2), previously 8 mm. Interval decrease in size of 13 x 7 mm pleural-based nodule along the mediastinum (image 32; series 2), previously 18 x 11 mm. Interval decrease in size of 1.3 cm lesion adjacent to the cardiac ventricle (image 44; series 2), previously 2.5 cm. Interval decrease in size of nodularity along the left fissure measuring approximately 0.7 cm (image 27; series 2), previously 2.2 cm. Musculoskeletal:  Thoracic spine degenerative changes. No aggressive or acute appearing osseous lesions. CT ABDOMEN PELVIS FINDINGS Hepatobiliary: The liver is normal in size and contour. No focal hepatic lesion is identified. Gallbladder is unremarkable. No intrahepatic or extrahepatic biliary ductal dilatation. Pancreas: Unremarkable Spleen: Unremarkable Adrenals/Urinary Tract: Normal adrenal glands. Kidneys enhance symmetrically with contrast. No hydronephrosis. Urinary bladder is unremarkable. Stomach/Bowel: Sigmoid colonic diverticulosis. No CT evidence for acute diverticulitis. Normal appendix. No evidence for bowel obstruction. No free fluid or free intraperitoneal air. Normal morphology of the stomach. Vascular/Lymphatic: Infrarenal abdominal aorta measures 3.0 cm. No retroperitoneal lymphadenopathy. Reproductive: Heterogeneous prostate. Other: Small bilateral fat containing inguinal hernias. Musculoskeletal: Lumbar spine degenerative changes. No aggressive or acute appearing osseous lesions. IMPRESSION: 1. Interval decrease in size of pleural based nodularity within the left hemithorax. 2. No evidence for metastasis within the abdomen or pelvis. 3. Abdominal aorta measures 3.0 cm. Recommend followup by ultrasound in 3 years. This recommendation follows ACR consensus guidelines: White Paper of the ACR Incidental Findings Committee II on Vascular Findings. J Am Coll Radiol  2013; 26:415-830. Aortic aneurysm NOS (ICD10-I71.9) 4. Similar-appearing dilated ascending thoracic aorta. Recommend attention on follow-up. Electronically Signed   By: Lovey Newcomer M.D.   On: 08/01/2019 16:20     ASSESSMENT/PLAN:  Pleasant 85 year old Caucasian male diagnosed with malignant pleural mesothelioma involving the left hemithorax.  He was diagnosed in March 2018.  He is status post a left VATS with biopsy and wedge resection of the left lower lobe as well as parietal pleurectomy under the care of Dr. Roxan Hockey in March 2018.  He  completed treatment with systemic chemotherapy with carboplatin, alimta, and Avastin.  He is status post 6 cycles.  He has been on observation for almost 2 years.  The patient showed evidence of disease progression in June 2020.  He recently restarted systemic chemotherapy with carboplatin for an AUC of 5, Alimta 500 mg/m, and Avastin 15 mg/kg IV every 3 weeks.  He is status post 3 cycles.  He has been tolerating treatment fairly well without any concerning adverse side effects.  The patient recently had a restaging CT scan performed.  Dr. Julien Nordmann personally independently reviewed the scan and discuss results with the patient today.  The scan did not show any concerning evidence for disease progression.  Dr. Julien Nordmann recommended that patient proceed with cycle #4 today as scheduled.  We will see the patient back for a follow-up visit in 4 weeks for evaluation before starting cycle #5.  The patient was advised to call immediately if he has any concerning symptoms in the interval. The patient voices understanding of current disease status and treatment options and is in agreement with the current care plan. All questions were answered. The patient knows to call the clinic with any problems, questions or concerns. We can certainly see the patient much sooner if necessary   No orders of the defined types were placed in this encounter.    Marcanthony Sleight L Carline Dura, PA-C 08/03/19  ADDENDUM: Hematology/Oncology Attending: I had a face-to-face encounter with the patient today.  I recommended his care plan.  This is a very pleasant 85 years old white male with recurrent malignant pleural mesothelioma.  The patient is currently undergoing systemic chemotherapy with carboplatin, Alimta and Avastin status post 3 cycles.  He has been tolerating this treatment well except for mild fatigue.  He denied having any significant nausea or vomiting he has no chest pain or shortness of breath. He had repeat CT  scan of the chest, abdomen and pelvis performed recently.  I personally and independently reviewed the scans and discussed the results with the patient today. His a scan showed improvement of his disease in the left side of the chest. I recommended for the patient to continue his current treatment with the same regimen and he will proceed with cycle #4 today. I will see the patient back for follow-up visit in 3 weeks for evaluation before the next cycle of his treatment. He was advised to call immediately if he has any concerning symptoms in the interval.  Disclaimer: This note was dictated with voice recognition software. Similar sounding words can inadvertently be transcribed and may be missed upon review. Eilleen Kempf, MD 08/03/19

## 2019-08-03 NOTE — Telephone Encounter (Signed)
Daughter is calling in with issues regarding her father. He is seeing his dead parents and when you try to correct him he tells you that you are lying. He is just seeing a lot of stuff that isn't there. She is wanting some advice. Please call her back after 12 she said. Thanks!

## 2019-08-07 ENCOUNTER — Telehealth: Payer: Self-pay | Admitting: Medical Oncology

## 2019-08-07 NOTE — Telephone Encounter (Signed)
Called Dean Pruitt no answer left message to call office back regarding her father

## 2019-08-07 NOTE — Telephone Encounter (Signed)
Patient daughter called back spoke with her  Pt called with c/o:  confusion/hallucinations New medications?  No. When did they start?  N/A If hallucinations are new, has patient been checked for infection, including UTI?  No. Current medications prescribed by Dr. Tomi Likens and TIMES taking the medications:  Aricept 10 mg at bedtime, Lexapro 20 mg at bedtime    Pt is very angry , behavioral disturbance, and talking to his dead parents, asking about his wife [name] whom had died.

## 2019-08-07 NOTE — Telephone Encounter (Signed)
Daughter thinks Dean Pruitt is dehydrated. Yesterday BP 171/106 when he got up and then a few minutes later it was  123/82. He is not drinking and getting dizzy and confusion is bad.  Labs due Thursday.

## 2019-08-07 NOTE — Telephone Encounter (Signed)
I would check UA and culture

## 2019-08-07 NOTE — Telephone Encounter (Signed)
Please encourage oral hydration.  We will check his lab on Thursday and see if he needs fluid.

## 2019-08-08 ENCOUNTER — Other Ambulatory Visit (INDEPENDENT_AMBULATORY_CARE_PROVIDER_SITE_OTHER): Payer: PPO

## 2019-08-08 ENCOUNTER — Other Ambulatory Visit: Payer: Self-pay

## 2019-08-08 DIAGNOSIS — F0150 Vascular dementia without behavioral disturbance: Secondary | ICD-10-CM

## 2019-08-08 DIAGNOSIS — G3184 Mild cognitive impairment, so stated: Secondary | ICD-10-CM | POA: Diagnosis not present

## 2019-08-08 DIAGNOSIS — R410 Disorientation, unspecified: Secondary | ICD-10-CM | POA: Diagnosis not present

## 2019-08-08 DIAGNOSIS — R413 Other amnesia: Secondary | ICD-10-CM

## 2019-08-08 DIAGNOSIS — E538 Deficiency of other specified B group vitamins: Secondary | ICD-10-CM

## 2019-08-08 NOTE — Telephone Encounter (Signed)
Called spoke with patient daughter she was made aware that order for U/A and Cx  place. She also aware of Lab protocol

## 2019-08-08 NOTE — Telephone Encounter (Signed)
LVM for dtr.

## 2019-08-09 LAB — URINALYSIS, ROUTINE W REFLEX MICROSCOPIC
Hgb urine dipstick: NEGATIVE
Leukocytes,Ua: NEGATIVE
Nitrite: NEGATIVE
RBC / HPF: NONE SEEN (ref 0–?)
Specific Gravity, Urine: 1.025 (ref 1.000–1.030)
Total Protein, Urine: 30 — AB
Urine Glucose: 100 — AB
Urobilinogen, UA: 4 — AB (ref 0.0–1.0)
pH: 6 (ref 5.0–8.0)

## 2019-08-09 NOTE — Telephone Encounter (Signed)
UA negative for UTI.  I would like to proceed with MRI of brain with and without contrast for acute confusion, hallucinations (and history of cancer).    To address agitation and hallucinations, I would like to switch escitalopram (Lexapro) to citalopram (Celexa), which may better treat agitation and possibly hallucinations.  He may stop escitalopram and strart citalopram 10mg  daily.

## 2019-08-10 ENCOUNTER — Telehealth: Payer: Self-pay | Admitting: Neurology

## 2019-08-10 ENCOUNTER — Inpatient Hospital Stay: Payer: PPO | Attending: Internal Medicine

## 2019-08-10 ENCOUNTER — Telehealth: Payer: Self-pay | Admitting: Medical Oncology

## 2019-08-10 ENCOUNTER — Other Ambulatory Visit: Payer: Self-pay

## 2019-08-10 DIAGNOSIS — E860 Dehydration: Secondary | ICD-10-CM | POA: Diagnosis not present

## 2019-08-10 DIAGNOSIS — R918 Other nonspecific abnormal finding of lung field: Secondary | ICD-10-CM | POA: Diagnosis not present

## 2019-08-10 DIAGNOSIS — J984 Other disorders of lung: Secondary | ICD-10-CM | POA: Diagnosis not present

## 2019-08-10 DIAGNOSIS — R0781 Pleurodynia: Secondary | ICD-10-CM | POA: Insufficient documentation

## 2019-08-10 DIAGNOSIS — F0390 Unspecified dementia without behavioral disturbance: Secondary | ICD-10-CM | POA: Diagnosis not present

## 2019-08-10 DIAGNOSIS — Z888 Allergy status to other drugs, medicaments and biological substances status: Secondary | ICD-10-CM | POA: Diagnosis not present

## 2019-08-10 DIAGNOSIS — E039 Hypothyroidism, unspecified: Secondary | ICD-10-CM | POA: Insufficient documentation

## 2019-08-10 DIAGNOSIS — K5730 Diverticulosis of large intestine without perforation or abscess without bleeding: Secondary | ICD-10-CM | POA: Diagnosis not present

## 2019-08-10 DIAGNOSIS — Z85850 Personal history of malignant neoplasm of thyroid: Secondary | ICD-10-CM | POA: Diagnosis not present

## 2019-08-10 DIAGNOSIS — Z79899 Other long term (current) drug therapy: Secondary | ICD-10-CM | POA: Diagnosis not present

## 2019-08-10 DIAGNOSIS — Z885 Allergy status to narcotic agent status: Secondary | ICD-10-CM | POA: Diagnosis not present

## 2019-08-10 DIAGNOSIS — Z9221 Personal history of antineoplastic chemotherapy: Secondary | ICD-10-CM | POA: Diagnosis not present

## 2019-08-10 DIAGNOSIS — Z8719 Personal history of other diseases of the digestive system: Secondary | ICD-10-CM | POA: Diagnosis not present

## 2019-08-10 DIAGNOSIS — E785 Hyperlipidemia, unspecified: Secondary | ICD-10-CM | POA: Insufficient documentation

## 2019-08-10 DIAGNOSIS — K219 Gastro-esophageal reflux disease without esophagitis: Secondary | ICD-10-CM | POA: Insufficient documentation

## 2019-08-10 DIAGNOSIS — K4020 Bilateral inguinal hernia, without obstruction or gangrene, not specified as recurrent: Secondary | ICD-10-CM | POA: Diagnosis not present

## 2019-08-10 DIAGNOSIS — M47816 Spondylosis without myelopathy or radiculopathy, lumbar region: Secondary | ICD-10-CM | POA: Insufficient documentation

## 2019-08-10 DIAGNOSIS — C450 Mesothelioma of pleura: Secondary | ICD-10-CM | POA: Diagnosis not present

## 2019-08-10 LAB — CMP (CANCER CENTER ONLY)
ALT: 23 U/L (ref 0–44)
AST: 34 U/L (ref 15–41)
Albumin: 3.6 g/dL (ref 3.5–5.0)
Alkaline Phosphatase: 67 U/L (ref 38–126)
Anion gap: 13 (ref 5–15)
BUN: 23 mg/dL (ref 8–23)
CO2: 25 mmol/L (ref 22–32)
Calcium: 8.8 mg/dL — ABNORMAL LOW (ref 8.9–10.3)
Chloride: 101 mmol/L (ref 98–111)
Creatinine: 0.95 mg/dL (ref 0.61–1.24)
GFR, Est AFR Am: >60 mL/min (ref >60)
GFR, Estimated: >60 mL/min (ref >60)
Glucose, Bld: 124 mg/dL — ABNORMAL HIGH (ref 70–99)
Potassium: 3.2 mmol/L — ABNORMAL LOW (ref 3.5–5.1)
Sodium: 139 mmol/L (ref 135–145)
Total Bilirubin: 1.9 mg/dL — ABNORMAL HIGH (ref 0.3–1.2)
Total Protein: 6.3 g/dL — ABNORMAL LOW (ref 6.5–8.1)

## 2019-08-10 LAB — CBC WITH DIFFERENTIAL (CANCER CENTER ONLY)
Abs Immature Granulocytes: 0.01 10*3/uL (ref 0.00–0.07)
Basophils Absolute: 0 10*3/uL (ref 0.0–0.1)
Basophils Relative: 0 %
Eosinophils Absolute: 0.1 10*3/uL (ref 0.0–0.5)
Eosinophils Relative: 2 %
HCT: 40.6 % (ref 39.0–52.0)
Hemoglobin: 14.1 g/dL (ref 13.0–17.0)
Immature Granulocytes: 0 %
Lymphocytes Relative: 51 %
Lymphs Abs: 1.2 10*3/uL (ref 0.7–4.0)
MCH: 34.3 pg — ABNORMAL HIGH (ref 26.0–34.0)
MCHC: 34.7 g/dL (ref 30.0–36.0)
MCV: 98.8 fL (ref 80.0–100.0)
Monocytes Absolute: 0.1 10*3/uL (ref 0.1–1.0)
Monocytes Relative: 6 %
Neutro Abs: 1 10*3/uL — ABNORMAL LOW (ref 1.7–7.7)
Neutrophils Relative %: 41 %
Platelet Count: 76 10*3/uL — ABNORMAL LOW (ref 150–400)
RBC: 4.11 MIL/uL — ABNORMAL LOW (ref 4.22–5.81)
RDW: 14.2 % (ref 11.5–15.5)
WBC Count: 2.4 10*3/uL — ABNORMAL LOW (ref 4.0–10.5)
nRBC: 0 % (ref 0.0–0.2)

## 2019-08-10 NOTE — Telephone Encounter (Signed)
..  lb

## 2019-08-10 NOTE — Telephone Encounter (Signed)
Patient's contact, Cecille Rubin, called to see if the lab results are back from yesterday yet.

## 2019-08-10 NOTE — Telephone Encounter (Signed)
Told dtr he does not need IVF. Neutropenic precautions.

## 2019-08-10 NOTE — Telephone Encounter (Signed)
Pt called

## 2019-08-11 ENCOUNTER — Telehealth: Payer: Self-pay | Admitting: Neurology

## 2019-08-11 LAB — URINE CULTURE
MICRO NUMBER:: 834549
SPECIMEN QUALITY:: ADEQUATE

## 2019-08-11 LAB — B12 AND FOLATE PANEL
Folate: 15 ng/mL
Vitamin B-12: 539 pg/mL (ref 200–1100)

## 2019-08-11 MED ORDER — SULFAMETHOXAZOLE-TRIMETHOPRIM 400-80 MG PO TABS: 1.0000 | ORAL_TABLET | Freq: Two times a day (BID) | ORAL | 0 refills | Status: DC

## 2019-08-11 NOTE — Telephone Encounter (Signed)
I spoke with patient's neurologist, Dr. Tomi Likens. Patient has dementia and saw Dr. Tomi Likens 2 days ago with increased agitation; U/A was checked in office and culture did grow out infection.  He was asking if antibiotic for UTI could be prescribed for patient. Urine culture was done at last OV and did grow out Proteus Mirabilis.

## 2019-08-11 NOTE — Telephone Encounter (Signed)
I spoke with patient's daughter, Cecille Rubin.  Mr. Romey has been agitated and hallucinating.  UTI was suspected as a  Possible etiology.  Urine culture positive for Proteus mirabilis.  I contacted patient's PCP, Dr. Quay Burow, who is out of the office.  Instead, I was directed to Dr. Valere Dross.  She kindly said she would send in antibiotic for Mr. Brozek.

## 2019-08-15 MED ORDER — CITALOPRAM HYDROBROMIDE 10 MG PO TABS: 10.0000 mg | ORAL_TABLET | Freq: Every day | ORAL | 5 refills | Status: AC

## 2019-08-15 NOTE — Addendum Note (Signed)
Addended by: Ranae Plumber on: 08/15/2019 09:02 AM   Modules accepted: Orders

## 2019-08-15 NOTE — Telephone Encounter (Signed)
Rx sent to pharmacy  MRI order place  Pt daughter made aware of results.

## 2019-08-17 ENCOUNTER — Inpatient Hospital Stay: Payer: PPO

## 2019-08-17 ENCOUNTER — Other Ambulatory Visit: Payer: Self-pay

## 2019-08-17 ENCOUNTER — Telehealth: Payer: Self-pay | Admitting: Neurology

## 2019-08-17 DIAGNOSIS — C450 Mesothelioma of pleura: Secondary | ICD-10-CM | POA: Diagnosis not present

## 2019-08-17 LAB — CBC WITH DIFFERENTIAL (CANCER CENTER ONLY)
Abs Immature Granulocytes: 0.01 10*3/uL (ref 0.00–0.07)
Basophils Absolute: 0 10*3/uL (ref 0.0–0.1)
Basophils Relative: 1 %
Eosinophils Absolute: 0 10*3/uL (ref 0.0–0.5)
Eosinophils Relative: 1 %
HCT: 31.3 % — ABNORMAL LOW (ref 39.0–52.0)
Hemoglobin: 10.6 g/dL — ABNORMAL LOW (ref 13.0–17.0)
Immature Granulocytes: 1 %
Lymphocytes Relative: 34 %
Lymphs Abs: 0.7 10*3/uL (ref 0.7–4.0)
MCH: 34.6 pg — ABNORMAL HIGH (ref 26.0–34.0)
MCHC: 33.9 g/dL (ref 30.0–36.0)
MCV: 102.3 fL — ABNORMAL HIGH (ref 80.0–100.0)
Monocytes Absolute: 0.3 10*3/uL (ref 0.1–1.0)
Monocytes Relative: 14 %
Neutro Abs: 1.1 10*3/uL — ABNORMAL LOW (ref 1.7–7.7)
Neutrophils Relative %: 49 %
Platelet Count: 52 10*3/uL — ABNORMAL LOW (ref 150–400)
RBC: 3.06 MIL/uL — ABNORMAL LOW (ref 4.22–5.81)
RDW: 15.4 % (ref 11.5–15.5)
WBC Count: 2.2 10*3/uL — ABNORMAL LOW (ref 4.0–10.5)
nRBC: 0 % (ref 0.0–0.2)

## 2019-08-17 LAB — CMP (CANCER CENTER ONLY)
ALT: 19 U/L (ref 0–44)
AST: 46 U/L — ABNORMAL HIGH (ref 15–41)
Albumin: 3.7 g/dL (ref 3.5–5.0)
Alkaline Phosphatase: 72 U/L (ref 38–126)
Anion gap: 11 (ref 5–15)
BUN: 35 mg/dL — ABNORMAL HIGH (ref 8–23)
CO2: 23 mmol/L (ref 22–32)
Calcium: 8.7 mg/dL — ABNORMAL LOW (ref 8.9–10.3)
Chloride: 109 mmol/L (ref 98–111)
Creatinine: 1.54 mg/dL — ABNORMAL HIGH (ref 0.61–1.24)
GFR, Est AFR Am: 48 mL/min — ABNORMAL LOW (ref >60)
GFR, Estimated: 41 mL/min — ABNORMAL LOW (ref >60)
Glucose, Bld: 110 mg/dL — ABNORMAL HIGH (ref 70–99)
Potassium: 4 mmol/L (ref 3.5–5.1)
Sodium: 143 mmol/L (ref 135–145)
Total Bilirubin: 0.9 mg/dL (ref 0.3–1.2)
Total Protein: 5.9 g/dL — ABNORMAL LOW (ref 6.5–8.1)

## 2019-08-17 NOTE — Telephone Encounter (Signed)
Pt daughter needs to know if she needs to still bring in a urine sample he was not DX with  A UTI and is on a medication for a infection so she is unsure if we still need that sample on Friday please call

## 2019-08-21 ENCOUNTER — Telehealth: Payer: Self-pay | Admitting: Internal Medicine

## 2019-08-21 ENCOUNTER — Telehealth: Payer: Self-pay | Admitting: Medical Oncology

## 2019-08-21 NOTE — Telephone Encounter (Signed)
Pt daughter was advised of this. She states that he is still having behavior disturbance/ hallucination   [facility] [address]

## 2019-08-21 NOTE — Telephone Encounter (Signed)
Pt daughter called and stated that Dr Tomi Likens diagnosed pt with UTI and would like to have the pt tested again. Could pt schedule something this week. Please advise

## 2019-08-21 NOTE — Progress Notes (Signed)
Subjective:    Patient ID: Dean Pruitt, male    DOB: [birth year], 85 y.o.   MRN: [id number]  HPI The patient is here for an acute visit.  He is here with his daughter.  He had a recent urinary tract infection that was diagnosed by his neurologist doing further evaluation of his memory change.  Mickel Baas in our office did prescribe him Bactrim, which she did complete.  He is a poor historian because of his dementia, but he currently denies any urinary symptoms, except for frequency of urination which is chronic.  He denies abdominal pain, new back pain, fevers or chills.  They are here to have his urine rechecked to make sure that the infection was successfully treated.    Medications and allergies reviewed with patient and updated if appropriate.  Patient Active Problem List   Diagnosis Date Noted  . Syncope 07/18/2019  . Diabetes mellitus without complication ([place]) 80/16/5537  . GERD (gastroesophageal reflux disease) 07/17/2019  . Loose stools 08/05/2018  . Ankle swelling, right 08/05/2018  . Urinary incontinence 08/05/2018  . Greater trochanteric bursitis, right 01/27/2018  . Otalgia, right 12/21/2017  . Bursitis of hip 11/17/2017  . Bilateral low back pain without sciatica 11/17/2017  . Thoracic aortic aneurysm without rupture ([place]) 11/09/2017  . Muscular chest pain 11/09/2017  . Recurrent falls 11/09/2017  . Hypothyroidism 10/13/2017  . B12 deficiency 10/13/2017  . Change in stool 10/13/2017  . Port catheter in place 06/02/2017  . Degenerative arthritis of left knee 03/24/2017  . Goals of care, counseling/discussion 03/20/2017  . Encounter for antineoplastic chemotherapy 03/20/2017  . Malignant mesothelioma of pleura ([place]) 02/19/2017  . History of lung biopsy 02/17/2017  . Greater trochanteric bursitis of left hip 12/31/2016  . Pleural effusion, left 10/27/2016  . infrarenal abdominal aortic aneurysm without rupture ([place]) - [place] needed 2020 10/22/2016  . Left lumbar  radiculopathy 10/12/2016  . Bursitis of left hip 09/30/2016  . Left foot drop 09/30/2016  . Mild cognitive impairment 07/03/2016  . Depression 07/03/2016  . Frequency of urination 09/26/2014  . Macular degeneration 06/22/2011  . Prediabetes 05/20/2009  . NONSPECIFIC ABNORMAL ELECTROCARDIOGRAM 05/20/2009  . THYROID CANCER, HX OF 05/20/2009  . History of colonic polyps 05/20/2009  . Hyperlipidemia 11/21/2007    Current Outpatient Medications on File Prior to Visit  Medication Sig Dispense Refill  . aspirin 81 MG tablet Take 81 mg by mouth daily.    . bifidobacterium infantis (ALIGN) capsule Take by mouth.    . blood glucose meter kit and supplies KIT Use to check blood sugars daily. Dx Code-E11.9 1 each 0  . citalopram (CELEXA) 10 MG tablet Take 1 tablet (10 mg total) by mouth daily. 30 tablet 5  . dexamethasone (DECADRON) 4 MG tablet 1 tablet p.o. twice daily the day before, day of and day after chemotherapy every 3 weeks. 40 tablet 1  . donepezil (ARICEPT) 10 MG tablet TAKE 1 TABLET BY MOUTH AT BEDTIME 90 tablet 0  . folic acid (FOLVITE) 1 MG tablet Take 1 tablet (1 mg total) by mouth daily. 30 tablet 2  . gabapentin (NEURONTIN) 300 MG capsule TAKE 1 CAPSULE BY MOUTH AT NIGHT 90 capsule 1  . glucose blood (COOL BLOOD GLUCOSE TEST STRIPS) test strip Use to check blood sugars daily. Dx code: E11.9 100 each 1  . Lancets 28G MISC Use to check blood sugars daily. Dx E11.9 100 each 1  . levothyroxine (SYNTHROID, LEVOTHROID) 150 MCG tablet TAKE 1  TABLET BY MOUTH ONCE DAILY 90 tablet 1  . lidocaine-prilocaine (EMLA) cream Apply 1 application topically as needed. 30 g 0  . meloxicam (MOBIC) 7.5 MG tablet Take 1 tablet by mouth once daily 90 tablet 0  . omeprazole (PRILOSEC) 20 MG capsule Take 1 capsule (20 mg total) by mouth daily. 90 capsule 1  . prochlorperazine (COMPAZINE) 10 MG tablet Take 1 tablet (10 mg total) by mouth every 6 (six) hours as needed for nausea or vomiting. 30 tablet 0  .  rosuvastatin (CRESTOR) 20 MG tablet Take 1 tablet by mouth once daily 90 tablet 0  . sulfamethoxazole-trimethoprim (BACTRIM) 400-80 MG tablet Take 1 tablet by mouth 2 (two) times daily. 10 tablet 0   No current facility-administered medications on file prior to visit.     Past Medical History:  Diagnosis Date  . Cataract   . Depression    situational  . GERD (gastroesophageal reflux disease)   . Goals of care, counseling/discussion 03/20/2017  . Hyperlipidemia   . Hypothyroidism   . Reactive depression 03/20/2017  . Thyroid cancer ([place])    PMH of; on supressive therapy    Past Surgical History:  Procedure Laterality Date  . CATARACT EXTRACTION, BILATERAL  2013 & 2014   Dr Celene Squibb  . colon polyps  2002 & 2005   negative 2008; Dr Earlean Shawl  . COLONOSCOPY W/ POLYPECTOMY    . LUNG BIOPSY Left 02/15/2017   Procedure: LUNG BIOPSY;  Surgeon: Melrose Nakayama, MD;  Location: [facility];  Service: Thoracic;  Laterality: Left;  . PLEURAL EFFUSION DRAINAGE Left 02/15/2017   Procedure: DRAINAGE OF PLEURAL EFFUSION;  Surgeon: Melrose Nakayama, MD;  Location: [facility];  Service: Thoracic;  Laterality: Left;  . PORTACATH PLACEMENT N/A 03/25/2017   Procedure: INSERTION PORT-A-CATH, right internal jugular;  Surgeon: Melrose Nakayama, MD;  Location: [facility];  Service: Thoracic;  Laterality: N/A;  . THYROIDECTOMY  2001   S/P RAI  . VIDEO ASSISTED THORACOSCOPY Left 02/15/2017   Procedure: VIDEO ASSISTED THORACOSCOPY;  Surgeon: Melrose Nakayama, MD;  Location: [facility];  Service: Thoracic;  Laterality: Left;    Social History   Socioeconomic History  . Marital status: Married    Spouse name: Not on file  . Number of children: 2  . Years of education: Not on file  . Highest education level: Not on file  Occupational History  . Occupation: Retired  Scientific laboratory technician  . Financial resource strain: Not on file  . Food insecurity    Worry: Not on file    Inability: Not on file  . Transportation needs     Medical: Not on file    Non-medical: Not on file  Tobacco Use  . Smoking status: Former Smoker    Packs/day: 1.00    Years: 5.00    Pack years: 5.00    Types: Cigarettes    Quit date: 12/07/1958    Years since quitting: 60.7  . Smokeless tobacco: Never Used  . Tobacco comment: smoked 1956-1960, up 1 ppd  Substance and Sexual Activity  . Alcohol use: Yes    Alcohol/week: 14.0 standard drinks    Types: 14 Glasses of wine per week  . Drug use: No  . Sexual activity: Not on file  Lifestyle  . Physical activity    Days per week: Not on file    Minutes per session: Not on file  . Stress: Not on file  Relationships  . Social connections    Talks  on phone: Not on file    Gets together: Not on file    Attends religious service: Not on file    Active member of club or organization: Not on file    Attends meetings of clubs or organizations: Not on file    Relationship status: Not on file  Other Topics Concern  . Not on file  Social History Narrative   Exercise:  Yard work, walks on occasion                Family History  Problem Relation Age of Onset  . Alzheimer's disease Mother   . Hypertension Mother   . Heart disease Father        CAD and angioplasty in 57s  . Cancer Father        Bladder  . Other Brother        valvular heart disease  . Diabetes Neg Hx   . Stroke Neg Hx     Review of Systems  Constitutional: Negative for chills and fever.  Gastrointestinal: Negative for abdominal pain.  Genitourinary: Negative for difficulty urinating, dysuria, frequency and hematuria.       No odor to the urine  Musculoskeletal: Negative for back pain.       Objective:   Vitals:   08/22/19 1534  BP: 124/80  Pulse: 81  Resp: 16  Temp: 98.8 F (37.1 C)  SpO2: 93%   BP Readings from Last 3 Encounters:  08/22/19 124/80  08/03/19 (!) 120/92  07/18/19 108/72   Wt Readings from Last 3 Encounters:  08/22/19 195 lb (88.5 kg)  08/03/19 192 lb 8 oz (87.3 kg)   07/13/19 199 lb (90.3 kg)   Body mass index is 26.45 kg/m.   Physical Exam Constitutional:      General: He is not in acute distress.    Appearance: Normal appearance. He is not ill-appearing.  HENT:     Head: Normocephalic and atraumatic.  Cardiovascular:     Rate and Rhythm: Normal rate and regular rhythm.  Pulmonary:     Effort: Pulmonary effort is normal. No respiratory distress.     Breath sounds: Normal breath sounds. No wheezing.  Abdominal:     General: There is no distension.     Palpations: Abdomen is soft.     Tenderness: There is no abdominal tenderness. There is no right CVA tenderness or left CVA tenderness.  Skin:    General: Skin is warm and dry.  Neurological:     Mental Status: He is alert.            Assessment & Plan:    See Problem List for Assessment and Plan of chronic medical problems.

## 2019-08-21 NOTE — Telephone Encounter (Signed)
Yes he can schedule to see Dr. Quay Burow.

## 2019-08-21 NOTE — Telephone Encounter (Signed)
Chemo break-Dtr  wants pt to take a break due to his increased confusion and weakness.   [place] going to [place] to beach next month on day of tx on 10/08.  Pt will keep appt this week and dtr can discuss with Dr Julien Nordmann.

## 2019-08-21 NOTE — Telephone Encounter (Signed)
MRI of brain is ordered. He should have started citalopram I would need to get EKG before I can prescribe anything else (evaluate QT interval)

## 2019-08-21 NOTE — Telephone Encounter (Signed)
Called patient daughter  MRI scheduled for next month appt with cardiology next week will request EKG then Dr. Gardiner Rhyme will fax request to have EKG done at this visit and to fax results to our office.

## 2019-08-21 NOTE — Telephone Encounter (Signed)
Spoke with patient daughter Cecille Rubin she would like to know if she needs to repeat U/A  After patient has completed antibiotic. Finish Rx last thursday

## 2019-08-21 NOTE — Telephone Encounter (Signed)
appt scheduled

## 2019-08-21 NOTE — Telephone Encounter (Signed)
I don't think so, but I would defer to PCP.  How is he doing?

## 2019-08-22 ENCOUNTER — Ambulatory Visit (INDEPENDENT_AMBULATORY_CARE_PROVIDER_SITE_OTHER): Payer: PPO | Admitting: Internal Medicine

## 2019-08-22 ENCOUNTER — Other Ambulatory Visit: Payer: Self-pay

## 2019-08-22 ENCOUNTER — Encounter: Payer: Self-pay | Admitting: Internal Medicine

## 2019-08-22 ENCOUNTER — Other Ambulatory Visit: Payer: PPO

## 2019-08-22 VITALS — BP 124/80 | HR 81 | Temp 98.8°F | Resp 16 | Ht 72.0 in | Wt 195.0 lb

## 2019-08-22 DIAGNOSIS — Z23 Encounter for immunization: Secondary | ICD-10-CM | POA: Diagnosis not present

## 2019-08-22 DIAGNOSIS — R350 Frequency of micturition: Secondary | ICD-10-CM

## 2019-08-22 LAB — POCT URINALYSIS DIPSTICK
Blood, UA: NEGATIVE
Glucose, UA: NEGATIVE
Nitrite, UA: NEGATIVE
Protein, UA: POSITIVE — AB
Spec Grav, UA: 1.015 (ref 1.010–1.025)
Urobilinogen, UA: 1 E.U./dL
pH, UA: 6 (ref 5.0–8.0)

## 2019-08-22 NOTE — Assessment & Plan Note (Signed)
Urinary frequency, recent UTI Completed Bactrim x5 days Advised follow-up to confirm infection was successfully treated Urine dip here questionable for residual infection Will send for culture and treat only if culture is positive

## 2019-08-22 NOTE — Patient Instructions (Addendum)
  We will call you with the results of his urine.     Medications reviewed and updated.  Changes include :   none   Flu immunization administered today.

## 2019-08-24 ENCOUNTER — Other Ambulatory Visit: Payer: Self-pay

## 2019-08-24 ENCOUNTER — Inpatient Hospital Stay (HOSPITAL_BASED_OUTPATIENT_CLINIC_OR_DEPARTMENT_OTHER): Payer: PPO | Admitting: Internal Medicine

## 2019-08-24 ENCOUNTER — Inpatient Hospital Stay: Payer: PPO

## 2019-08-24 ENCOUNTER — Encounter: Payer: Self-pay | Admitting: Internal Medicine

## 2019-08-24 VITALS — BP 166/98 | HR 58 | Temp 98.2°F | Resp 17 | Ht 72.0 in | Wt 194.9 lb

## 2019-08-24 DIAGNOSIS — Z95828 Presence of other vascular implants and grafts: Secondary | ICD-10-CM

## 2019-08-24 DIAGNOSIS — C450 Mesothelioma of pleura: Secondary | ICD-10-CM | POA: Diagnosis not present

## 2019-08-24 DIAGNOSIS — Z5111 Encounter for antineoplastic chemotherapy: Secondary | ICD-10-CM

## 2019-08-24 LAB — URINE CULTURE
MICRO NUMBER:: 883029
SPECIMEN QUALITY:: ADEQUATE

## 2019-08-24 LAB — CBC WITH DIFFERENTIAL (CANCER CENTER ONLY)
Abs Immature Granulocytes: 0.02 10*3/uL (ref 0.00–0.07)
Basophils Absolute: 0 10*3/uL (ref 0.0–0.1)
Basophils Relative: 1 %
Eosinophils Absolute: 0.1 10*3/uL (ref 0.0–0.5)
Eosinophils Relative: 3 %
HCT: 32.1 % — ABNORMAL LOW (ref 39.0–52.0)
Hemoglobin: 10.8 g/dL — ABNORMAL LOW (ref 13.0–17.0)
Immature Granulocytes: 1 %
Lymphocytes Relative: 48 %
Lymphs Abs: 1.1 10*3/uL (ref 0.7–4.0)
MCH: 35.5 pg — ABNORMAL HIGH (ref 26.0–34.0)
MCHC: 33.6 g/dL (ref 30.0–36.0)
MCV: 105.6 fL — ABNORMAL HIGH (ref 80.0–100.0)
Monocytes Absolute: 0.6 10*3/uL (ref 0.1–1.0)
Monocytes Relative: 26 %
Neutro Abs: 0.5 10*3/uL — ABNORMAL LOW (ref 1.7–7.7)
Neutrophils Relative %: 21 %
Platelet Count: 141 10*3/uL — ABNORMAL LOW (ref 150–400)
RBC: 3.04 MIL/uL — ABNORMAL LOW (ref 4.22–5.81)
RDW: 18.1 % — ABNORMAL HIGH (ref 11.5–15.5)
WBC Count: 2.2 10*3/uL — ABNORMAL LOW (ref 4.0–10.5)
nRBC: 0 % (ref 0.0–0.2)

## 2019-08-24 LAB — CMP (CANCER CENTER ONLY)
ALT: 11 U/L (ref 0–44)
AST: 32 U/L (ref 15–41)
Albumin: 3.3 g/dL — ABNORMAL LOW (ref 3.5–5.0)
Alkaline Phosphatase: 71 U/L (ref 38–126)
Anion gap: 8 (ref 5–15)
BUN: 15 mg/dL (ref 8–23)
CO2: 26 mmol/L (ref 22–32)
Calcium: 8 mg/dL — ABNORMAL LOW (ref 8.9–10.3)
Chloride: 109 mmol/L (ref 98–111)
Creatinine: 0.92 mg/dL (ref 0.61–1.24)
GFR, Est AFR Am: >60 mL/min (ref >60)
GFR, Estimated: >60 mL/min (ref >60)
Glucose, Bld: 92 mg/dL (ref 70–99)
Potassium: 3.9 mmol/L (ref 3.5–5.1)
Sodium: 143 mmol/L (ref 135–145)
Total Bilirubin: 1 mg/dL (ref 0.3–1.2)
Total Protein: 5.5 g/dL — ABNORMAL LOW (ref 6.5–8.1)

## 2019-08-24 LAB — TOTAL PROTEIN, URINE DIPSTICK: Protein, ur: <30 mg/dL — AB

## 2019-08-24 MED ORDER — HEPARIN SOD (PORK) LOCK FLUSH 100 UNIT/ML IV SOLN
500.0000 [IU] | Freq: Once | INTRAVENOUS | Status: AC
  Administered 2019-08-24: 500 [IU]
  Filled 2019-08-24: qty 5

## 2019-08-24 MED ORDER — SODIUM CHLORIDE 0.9% FLUSH
10.0000 mL | Freq: Once | INTRAVENOUS | Status: AC
  Administered 2019-08-24: 10 mL
  Filled 2019-08-24: qty 10

## 2019-08-24 MED ORDER — SODIUM CHLORIDE 0.9 % IV SOLN
INTRAVENOUS | Status: AC
  Administered 2019-08-24: 11:00:00 via INTRAVENOUS
  Filled 2019-08-24 (×2): qty 250

## 2019-08-24 MED ORDER — TBO-FILGRASTIM 480 MCG/0.8ML ~~LOC~~ SOSY
480.0000 ug | PREFILLED_SYRINGE | Freq: Once | SUBCUTANEOUS | Status: AC
  Administered 2019-08-24: 11:00:00 480 ug via SUBCUTANEOUS

## 2019-08-24 MED ORDER — TBO-FILGRASTIM 480 MCG/0.8ML ~~LOC~~ SOSY
PREFILLED_SYRINGE | SUBCUTANEOUS | Status: AC
  Filled 2019-08-24: qty 0.8

## 2019-08-24 NOTE — Patient Instructions (Signed)
Tbo-Filgrastim injection What is this medicine? TBO-FILGRASTIM (T B O fil GRA stim) is a granulocyte colony-stimulating factor that helps you make more neutrophils, a type of white blood cell. Neutrophils are important for fighting infections. Some chemotherapy affects your bone marrow and lowers your neutrophils. This medicine helps decrease the length of time that neutrophils are very low (severe neutropenia). This medicine may be used for other purposes; ask your health care provider or pharmacist if you have questions. COMMON BRAND NAME(S): Granix What should I tell my health care provider before I take this medicine? They need to know if you have any of these conditions:  bone scan or tests planned  kidney disease  sickle cell anemia  an unusual or allergic reaction to tbo-filgrastim, filgrastim, pegfilgrastim, other medicines, foods, dyes, or preservatives  pregnant or trying to get pregnant  breast-feeding How should I use this medicine? This medicine is for injection under the skin. If you get this medicine at home, you will be taught how to prepare and give this medicine. Refer to the Instructions for Use that come with your medication packaging. Use exactly as directed. Take your medicine at regular intervals. Do not take your medicine more often than directed. It is important that you put your used needles and syringes in a special sharps container. Do not put them in a trash can. If you do not have a sharps container, call your pharmacist or healthcare provider to get one. Talk to your pediatrician regarding the use of this medicine in children. While this drug may be prescribed for children as young as 62 month of age for selected conditions, precautions do apply. Overdosage: If you think you have taken too much of this medicine contact a poison control center or emergency room at once. NOTE: This medicine is only for you. Do not share this medicine with others. What if I miss a  dose? It is important not to miss your dose. Call your doctor or health care professional if you miss a dose. What may interact with this medicine? This medicine may interact with the following medications:  medicines that may cause a release of neutrophils, such as lithium This list may not describe all possible interactions. Give your health care provider a list of all the medicines, herbs, non-prescription drugs, or dietary supplements you use. Also tell them if you smoke, drink alcohol, or use illegal drugs. Some items may interact with your medicine. What should I watch for while using this medicine? You may need blood work done while you are taking this medicine. What side effects may I notice from receiving this medicine? Side effects that you should report to your doctor or health care professional as soon as possible:  allergic reactions like skin rash, itching or hives, swelling of the face, lips, or tongue  back pain  blood in the urine  dark urine  dizziness  fast heartbeat  feeling faint  shortness of breath or breathing problems  signs and symptoms of infection like fever or chills; cough; or sore throat  signs and symptoms of kidney injury like trouble passing urine or change in the amount of urine  stomach or side pain, or pain at the shoulder  sweating  swelling of the legs, ankles, or abdomen  tiredness Side effects that usually do not require medical attention (report to your doctor or health care professional if they continue or are bothersome):  bone pain  diarrhea  headache  muscle pain  vomiting This list may  not describe all possible side effects. Call your doctor for medical advice about side effects. You may report side effects to FDA at 1-800-FDA-1088. Where should I keep my medicine? Keep out of the reach of children. Store in a refrigerator between 2 and 8 degrees C (36 and 46 degrees F). Keep in carton to protect from light. Throw  away this medicine if it is left out of the refrigerator for more than 5 consecutive days. Throw away any unused medicine after the expiration date. NOTE: This sheet is a summary. It may not cover all possible information. If you have questions about this medicine, talk to your doctor, pharmacist, or health care provider.  2020 Elsevier/Gold Standard (2018-09-24 19:58:39)   Rehydration, Adult Rehydration is the replacement of body fluids and salts and minerals (electrolytes) that are lost during dehydration. Dehydration is when there is not enough fluid or water in the body. This happens when you lose more fluids than you take in. Common causes of dehydration include:  Vomiting.  Diarrhea.  Excessive sweating, such as from heat exposure or exercise.  Taking medicines that cause the body to lose excess fluid (diuretics).  Impaired kidney function.  Not drinking enough fluid.  Certain illnesses or infections.  Certain poorly controlled long-term (chronic) illnesses, such as diabetes, heart disease, and kidney disease.  Symptoms of mild dehydration may include thirst, dry lips and mouth, dry skin, and dizziness. Symptoms of severe dehydration may include increased heart rate, confusion, fainting, and not urinating. You can rehydrate by drinking certain fluids or getting fluids through an IV tube, as told by your health care provider. What are the risks? Generally, rehydration is safe. However, one problem that can happen is taking in too much fluid (overhydration). This is rare. If overhydration happens, it can cause an electrolyte imbalance, kidney failure, or a decrease in salt (sodium) levels in the body. How to rehydrate Follow instructions from your health care provider for rehydration. The kind of fluid you should drink and the amount you should drink depend on your condition.  If directed by your health care provider, drink an oral rehydration solution (ORS). This is a drink  designed to treat dehydration that is found in pharmacies and retail stores. ? Make an ORS by following instructions on the package. ? Start by drinking small amounts, about  cup (120 mL) every 5-10 minutes. ? Slowly increase how much you drink until you have taken the amount recommended by your health care provider.  Drink enough clear fluids to keep your urine clear or pale yellow. If you were instructed to drink an ORS, finish the ORS first, then start slowly drinking other clear fluids. Drink fluids such as: ? Water. Do not drink only water. Doing that can lead to having too little sodium in your body (hyponatremia). ? Ice chips. ? Fruit juice that you have added water to (diluted juice). ? Low-calorie sports drinks.  If you are severely dehydrated, your health care provider may recommend that you receive fluids through an IV tube in the hospital.  Do not take sodium tablets. Doing that can lead to the condition of having too much sodium in your body (hypernatremia). Eating while you rehydrate Follow instructions from your health care provider about what to eat while you rehydrate. Your health care provider may recommend that you slowly begin eating regular foods in small amounts.  Eat foods that contain a healthy balance of electrolytes, such as bananas, oranges, potatoes, tomatoes, and spinach.  Avoid foods  that are greasy or contain a lot of fat or sugar.  In some cases, you may get nutrition through a feeding tube that is passed through your nose and into your stomach (nasogastric tube, or NG tube). This may be done if you have uncontrolled vomiting or diarrhea. Beverages to avoid Certain beverages may make dehydration worse. While you rehydrate, avoid:  Alcohol.  Caffeine.  Drinks that contain a lot of sugar. These include: ? High-calorie sports drinks. ? Fruit juice that is not diluted. ? Soda.  Check nutrition labels to see how much sugar or caffeine a beverage contains.  Signs of dehydration recovery You may be recovering from dehydration if:  You are urinating more often than before you started rehydrating.  Your urine is clear or pale yellow.  Your energy level improves.  You vomit less frequently.  You have diarrhea less frequently.  Your appetite improves or returns to normal.  You feel less dizzy or less light-headed.  Your skin tone and color start to look more normal. Contact a health care provider if:  You continue to have symptoms of mild dehydration, such as: ? Thirst. ? Dry lips. ? Slightly dry mouth. ? Dry, warm skin. ? Dizziness.  You continue to vomit or have diarrhea. Get help right away if:  You have symptoms of dehydration that get worse.  You feel: ? Confused. ? Weak. ? Like you are going to faint.  You have not urinated in 6-8 hours.  You have very dark urine.  You have trouble breathing.  Your heart rate while sitting still is over 100 beats a minute.  You cannot drink fluids without vomiting.  You have vomiting or diarrhea that: ? Gets worse. ? Does not go away.  You have a fever. This information is not intended to replace advice given to you by your health care provider. Make sure you discuss any questions you have with your health care provider. Document Released: 02/15/2012 Document Revised: 11/05/2017 Document Reviewed: 01/17/2016 Elsevier Patient Education  2020 [employer].

## 2019-08-24 NOTE — Progress Notes (Signed)
[place] [phone]   [phone]) [phone]  OFFICE PROGRESS NOTE  [name], MD [place] [address]  DIAGNOSIS: Stage II malignant pleural mesothelioma, epithelioid type involving the left hemithorax diagnosed in March 2018.  PRIOR THERAPY:  1) Status post left VATS with biopsy and wedge resection of the left lower lobe as well as parietal pleurectomy under the care of Dr. Roxan Hockey on 02/15/2017. 2) Palliative systemic chemotherapy with carboplatin for AUC of 5, Alimta 500 MG/M2 and Avastin 15 MG/KG every 3 weeks. Status post 6 cycles.  The patient has been on observation for almost 2 years.  CURRENT THERAPY: Resuming systemic chemotherapy with carboplatin for AUC of 5, Alimta 500 mg/M2 and Avastin 15 mg/KG every 3 weeks.  First dose May 31, 2019.  Status post 4 cycles.  INTERVAL HISTORY: [place] 85 y.o. male returns to the clinic today for follow-up visit.  The patient is feeling fine today with no concerning complaints except for mild fatigue.  He has been tolerating his systemic chemotherapy fairly well with no significant adverse effects.  He denied having any current nausea, vomiting, diarrhea or constipation.  He denied having any fever or chills.  He has no chest pain, shortness of breath, cough or hemoptysis.  He has no recent weight loss or night sweats.  He has no bleeding issues.  He was supposed to start cycle #5 of his treatment today.   MEDICAL HISTORY: Past Medical History:  Diagnosis Date   Cataract    Depression    situational   GERD (gastroesophageal reflux disease)    Goals of care, counseling/discussion 03/20/2017   Hyperlipidemia    Hypothyroidism    Reactive depression 03/20/2017   Thyroid cancer ([place])    PMH of; on supressive therapy    ALLERGIES:  is allergic to ativan [lorazepam]; fentanyl; and tramadol.  MEDICATIONS:  Current Outpatient Medications  Medication Sig Dispense  Refill   aspirin 81 MG tablet Take 81 mg by mouth daily.     bifidobacterium infantis (ALIGN) capsule Take by mouth.     blood glucose meter kit and supplies KIT Use to check blood sugars daily. Dx Code-E11.9 1 each 0   citalopram (CELEXA) 10 MG tablet Take 1 tablet (10 mg total) by mouth daily. 30 tablet 5   dexamethasone (DECADRON) 4 MG tablet 1 tablet p.o. twice daily the day before, day of and day after chemotherapy every 3 weeks. 40 tablet 1   donepezil (ARICEPT) 10 MG tablet TAKE 1 TABLET BY MOUTH AT BEDTIME 90 tablet 0   folic acid (FOLVITE) 1 MG tablet Take 1 tablet (1 mg total) by mouth daily. 30 tablet 2   gabapentin (NEURONTIN) 300 MG capsule TAKE 1 CAPSULE BY MOUTH AT NIGHT 90 capsule 1   glucose blood (COOL BLOOD GLUCOSE TEST STRIPS) test strip Use to check blood sugars daily. Dx code: E11.9 100 each 1   Lancets 28G MISC Use to check blood sugars daily. Dx E11.9 100 each 1   levothyroxine (SYNTHROID, LEVOTHROID) 150 MCG tablet TAKE 1 TABLET BY MOUTH ONCE DAILY 90 tablet 1   lidocaine-prilocaine (EMLA) cream Apply 1 application topically as needed. 30 g 0   meloxicam (MOBIC) 7.5 MG tablet Take 1 tablet by mouth once daily 90 tablet 0   omeprazole (PRILOSEC) 20 MG capsule Take 1 capsule (20 mg total) by mouth daily. 90 capsule 1   prochlorperazine (COMPAZINE) 10 MG tablet Take  1 tablet (10 mg total) by mouth every 6 (six) hours as needed for nausea or vomiting. 30 tablet 0   rosuvastatin (CRESTOR) 20 MG tablet Take 1 tablet by mouth once daily 90 tablet 0   No current facility-administered medications for this visit.     SURGICAL HISTORY:  Past Surgical History:  Procedure Laterality Date   CATARACT EXTRACTION, BILATERAL  2013 & 2014   Dr Celene Squibb   colon polyps  2002 & 2005   negative 2008; Dr Earlean Shawl   COLONOSCOPY W/ POLYPECTOMY     LUNG BIOPSY Left 02/15/2017   Procedure: LUNG BIOPSY;  Surgeon: Melrose Nakayama, MD;  Location: [facility];  Service: Thoracic;   Laterality: Left;   PLEURAL EFFUSION DRAINAGE Left 02/15/2017   Procedure: DRAINAGE OF PLEURAL EFFUSION;  Surgeon: Melrose Nakayama, MD;  Location: [facility];  Service: Thoracic;  Laterality: Left;   PORTACATH PLACEMENT N/A 03/25/2017   Procedure: INSERTION PORT-A-CATH, right internal jugular;  Surgeon: Melrose Nakayama, MD;  Location: [facility];  Service: Thoracic;  Laterality: N/A;   THYROIDECTOMY  2001   S/P RAI   VIDEO ASSISTED THORACOSCOPY Left 02/15/2017   Procedure: VIDEO ASSISTED THORACOSCOPY;  Surgeon: Melrose Nakayama, MD;  Location: [facility];  Service: Thoracic;  Laterality: Left;    REVIEW OF SYSTEMS:  A comprehensive review of systems was negative except for: Constitutional: positive for fatigue   PHYSICAL EXAMINATION: General appearance: alert, cooperative, fatigued and no distress Head: Normocephalic, without obvious abnormality, atraumatic Neck: no adenopathy, no JVD, supple, symmetrical, trachea midline and thyroid not enlarged, symmetric, no tenderness/mass/nodules Lymph nodes: Cervical, supraclavicular, and axillary nodes normal. Resp: clear to auscultation bilaterally Back: symmetric, no curvature. ROM normal. No CVA tenderness. Cardio: regular rate and rhythm, S1, S2 normal, no murmur, click, rub or gallop GI: soft, non-tender; bowel sounds normal; no masses,  no organomegaly Extremities: extremities normal, atraumatic, no cyanosis or edema  ECOG PERFORMANCE STATUS: 1 - Symptomatic but completely ambulatory  Blood pressure (!) 166/98, pulse (!) 58, temperature 98.2 F (36.8 C), temperature source Temporal, resp. rate 17, height 6' (1.829 m), weight 194 lb 14.4 oz (88.4 kg), SpO2 100 %.  LABORATORY DATA: Lab Results  Component Value Date   WBC 2.2 (L) 08/24/2019   HGB 10.8 (L) 08/24/2019   HCT 32.1 (L) 08/24/2019   MCV 105.6 (H) 08/24/2019   PLT 141 (L) 08/24/2019      Chemistry      Component Value Date/Time   NA 143 08/24/2019 0949   NA 140  11/04/2017 1210   K 3.9 08/24/2019 0949   K 4.1 11/04/2017 1210   CL 109 08/24/2019 0949   CO2 26 08/24/2019 0949   CO2 26 11/04/2017 1210   BUN 15 08/24/2019 0949   BUN 20.1 11/04/2017 1210   CREATININE 0.92 08/24/2019 0949   CREATININE 1.2 11/04/2017 1210      Component Value Date/Time   CALCIUM 8.0 (L) 08/24/2019 0949   CALCIUM 9.6 11/04/2017 1210   ALKPHOS 71 08/24/2019 0949   ALKPHOS 62 11/04/2017 1210   AST 32 08/24/2019 0949   AST 37 (H) 11/04/2017 1210   ALT 11 08/24/2019 0949   ALT 22 11/04/2017 1210   BILITOT 1.0 08/24/2019 0949   BILITOT 1.32 (H) 11/04/2017 1210       RADIOGRAPHIC STUDIES: Ct Chest W Contrast  Result Date: 08/01/2019 CLINICAL DATA:  Patient with history of mesothelioma diagnosed in 2018 status post chemotherapy. EXAM: CT CHEST, ABDOMEN, AND PELVIS WITH  CONTRAST TECHNIQUE: Multidetector CT imaging of the chest, abdomen and pelvis was performed following the standard protocol during bolus administration of intravenous contrast. CONTRAST:  138m OMNIPAQUE IOHEXOL 300 MG/ML  SOLN COMPARISON:  CT chest 05/19/2019; CT CAP 02/04/2018 FINDINGS: CT CHEST FINDINGS Cardiovascular: Right anterior chest wall Port-A-Cath is present with tip terminating in the superior vena cava. Heart is mildly enlarged. Trace fluid superior pericardial recess. Thoracic aortic vascular calcifications. Similar-appearing dilation of the ascending thoracic aorta measuring 4.2 cm. Mediastinum/Nodes: No enlarged axillary, mediastinal or hilar lymphadenopathy. Normal appearance of the esophagus. Lungs/Pleura: Central airways are patent. Similar-appearing peripheral scarring within the lingula and left lower lobe. No large area pulmonary consolidation. No pleural effusion or pneumothorax. Interval decrease in size of 7 mm pleural based nodule left upper hemithorax, previously 9 mm (image 12; series 2). Interval decrease in size of 6 mm pleural based nodule along the superior mediastinum (image 21;  series 2), previously 8 mm. Interval decrease in size of 13 x 7 mm pleural-based nodule along the mediastinum (image 32; series 2), previously 18 x 11 mm. Interval decrease in size of 1.3 cm lesion adjacent to the cardiac ventricle (image 44; series 2), previously 2.5 cm. Interval decrease in size of nodularity along the left fissure measuring approximately 0.7 cm (image 27; series 2), previously 2.2 cm. Musculoskeletal: Thoracic spine degenerative changes. No aggressive or acute appearing osseous lesions. CT ABDOMEN PELVIS FINDINGS Hepatobiliary: The liver is normal in size and contour. No focal hepatic lesion is identified. Gallbladder is unremarkable. No intrahepatic or extrahepatic biliary ductal dilatation. Pancreas: Unremarkable Spleen: Unremarkable Adrenals/Urinary Tract: Normal adrenal glands. Kidneys enhance symmetrically with contrast. No hydronephrosis. Urinary bladder is unremarkable. Stomach/Bowel: Sigmoid colonic diverticulosis. No CT evidence for acute diverticulitis. Normal appendix. No evidence for bowel obstruction. No free fluid or free intraperitoneal air. Normal morphology of the stomach. Vascular/Lymphatic: Infrarenal abdominal aorta measures 3.0 cm. No retroperitoneal lymphadenopathy. Reproductive: Heterogeneous prostate. Other: Small bilateral fat containing inguinal hernias. Musculoskeletal: Lumbar spine degenerative changes. No aggressive or acute appearing osseous lesions. IMPRESSION: 1. Interval decrease in size of pleural based nodularity within the left hemithorax. 2. No evidence for metastasis within the abdomen or pelvis. 3. Abdominal aorta measures 3.0 cm. Recommend followup by ultrasound in 3 years. This recommendation follows ACR consensus guidelines: White Paper of the ACR Incidental Findings Committee II on Vascular Findings. J Am Coll Radiol 2013; 10:789-794. Aortic aneurysm NOS (ICD10-I71.9) 4. Similar-appearing dilated ascending thoracic aorta. Recommend attention on  follow-up. Electronically Signed   By: DLovey NewcomerM.D.   On: 08/01/2019 16:20   Ct Abdomen Pelvis W Contrast  Result Date: 08/01/2019 CLINICAL DATA:  Patient with history of mesothelioma diagnosed in 2018 status post chemotherapy. EXAM: CT CHEST, ABDOMEN, AND PELVIS WITH CONTRAST TECHNIQUE: Multidetector CT imaging of the chest, abdomen and pelvis was performed following the standard protocol during bolus administration of intravenous contrast. CONTRAST:  1060mOMNIPAQUE IOHEXOL 300 MG/ML  SOLN COMPARISON:  CT chest 05/19/2019; CT CAP 02/04/2018 FINDINGS: CT CHEST FINDINGS Cardiovascular: Right anterior chest wall Port-A-Cath is present with tip terminating in the superior vena cava. Heart is mildly enlarged. Trace fluid superior pericardial recess. Thoracic aortic vascular calcifications. Similar-appearing dilation of the ascending thoracic aorta measuring 4.2 cm. Mediastinum/Nodes: No enlarged axillary, mediastinal or hilar lymphadenopathy. Normal appearance of the esophagus. Lungs/Pleura: Central airways are patent. Similar-appearing peripheral scarring within the lingula and left lower lobe. No large area pulmonary consolidation. No pleural effusion or pneumothorax. Interval decrease in size of 7 mm pleural  based nodule left upper hemithorax, previously 9 mm (image 12; series 2). Interval decrease in size of 6 mm pleural based nodule along the superior mediastinum (image 21; series 2), previously 8 mm. Interval decrease in size of 13 x 7 mm pleural-based nodule along the mediastinum (image 32; series 2), previously 18 x 11 mm. Interval decrease in size of 1.3 cm lesion adjacent to the cardiac ventricle (image 44; series 2), previously 2.5 cm. Interval decrease in size of nodularity along the left fissure measuring approximately 0.7 cm (image 27; series 2), previously 2.2 cm. Musculoskeletal: Thoracic spine degenerative changes. No aggressive or acute appearing osseous lesions. CT ABDOMEN PELVIS FINDINGS  Hepatobiliary: The liver is normal in size and contour. No focal hepatic lesion is identified. Gallbladder is unremarkable. No intrahepatic or extrahepatic biliary ductal dilatation. Pancreas: Unremarkable Spleen: Unremarkable Adrenals/Urinary Tract: Normal adrenal glands. Kidneys enhance symmetrically with contrast. No hydronephrosis. Urinary bladder is unremarkable. Stomach/Bowel: Sigmoid colonic diverticulosis. No CT evidence for acute diverticulitis. Normal appendix. No evidence for bowel obstruction. No free fluid or free intraperitoneal air. Normal morphology of the stomach. Vascular/Lymphatic: Infrarenal abdominal aorta measures 3.0 cm. No retroperitoneal lymphadenopathy. Reproductive: Heterogeneous prostate. Other: Small bilateral fat containing inguinal hernias. Musculoskeletal: Lumbar spine degenerative changes. No aggressive or acute appearing osseous lesions. IMPRESSION: 1. Interval decrease in size of pleural based nodularity within the left hemithorax. 2. No evidence for metastasis within the abdomen or pelvis. 3. Abdominal aorta measures 3.0 cm. Recommend followup by ultrasound in 3 years. This recommendation follows ACR consensus guidelines: White Paper of the ACR Incidental Findings Committee II on Vascular Findings. J Am Coll Radiol 2013; 10:789-794. Aortic aneurysm NOS (ICD10-I71.9) 4. Similar-appearing dilated ascending thoracic aorta. Recommend attention on follow-up. Electronically Signed   By: Lovey Newcomer M.D.   On: 08/01/2019 16:20    ASSESSMENT AND PLAN:  This is a very pleasant 85 years old white male with malignant pleural mesothelioma involving the left hemothorax. He completed treatment with systemic chemotherapy with carboplatin, Alimta and Avastin status post 6 cycles. He has been tolerating the treatment fairly well with no significant adverse effects except for fatigue. The patient has been in observation for almost 2 years. Repeat the scan and June 2020 showed evidence for  recurrent disease in the left side of the chest with several areas of progression compared to the previous imaging studies. The patient was a started on systemic chemotherapy again with carboplatin for AUC of 5, Alimta 500 mg/M2 and Avastin 15 mg/KG every 3 weeks.  He status post 4 cycles.   The patient has been tolerating this treatment well with no concerning adverse effects. CBC today showed neutropenia with absolute neutrophil count of 500. I recommended for the patient to delay his treatment by 1 week and he will start cycle #5 next week.  I will also reduce the dose of carboplatin to AUC of 4 and Alimta to 400 mg/M2 starting from cycle #5. I will arrange for the patient to receive a dose of Granix 480 mcg subcutaneously today because of his neutropenia. For the dehydration and weakness, I will arrange for the patient to receive 1 L of normal saline in the clinic today. The patient will come back for follow-up visit in 1 week for reevaluation before resuming his treatment. He was advised to call immediately if he has any concerning symptoms in the interval. The patient voices understanding of current disease status and treatment options and is in agreement with the current care plan. All questions were  answered. The patient knows to call the clinic with any problems, questions or concerns. We can certainly see the patient much sooner if necessary.  Disclaimer: This note was dictated with voice recognition software. Similar sounding words can inadvertently be transcribed and may not be corrected upon review.

## 2019-08-25 ENCOUNTER — Telehealth: Payer: Self-pay | Admitting: *Deleted

## 2019-08-25 ENCOUNTER — Telehealth: Payer: Self-pay | Admitting: Physician Assistant

## 2019-08-25 NOTE — Telephone Encounter (Signed)
Scheduled appt per 6/19 los - called pts daughter - no answer . Left message with appt date and time -

## 2019-08-25 NOTE — Telephone Encounter (Signed)
Received TC from pt's daughter.  She states that her father is having some back pain-it started last night. Advised that it is likely from his granix injection yesterday which can cause bone/muscle pains and aches.  Advised to try ibuprofen OTC 1-2 tabs every 8 hours, claritin daily for 5 days and heating pad to his back, making sure the heating pad is not directly on his skin. Advised to call back if this does not help relief the discomfort. Advised that the discomfort will ease over the next few days. Both pt and daughter voiced understanding

## 2019-08-26 ENCOUNTER — Other Ambulatory Visit: Payer: Self-pay | Admitting: Internal Medicine

## 2019-08-26 DIAGNOSIS — Z95828 Presence of other vascular implants and grafts: Secondary | ICD-10-CM

## 2019-08-26 DIAGNOSIS — C450 Mesothelioma of pleura: Secondary | ICD-10-CM

## 2019-08-26 DIAGNOSIS — Z5111 Encounter for antineoplastic chemotherapy: Secondary | ICD-10-CM

## 2019-08-27 NOTE — Progress Notes (Signed)
Cardiology Office Note:    Date:  08/28/2019   ID:  Dean Pruitt, DOB June 03, 1934, MRN [id number]  PCP:  [name], MD  Cardiologist:  No primary care provider on file.  Electrophysiologist:  None   Referring MD: [name], MD   Chief complaint: syncope  History of Present Illness:    Dean Pruitt is a 85 y.o. male with a hx of stage II malignant pleural mesothelioma status post surgery and chemotherapy, thyroid cancer, hypothyroidism, hyperlipidemia who is referred by Dr. Quay Burow for evaluation of syncope.  He initially underwent surgery and chemotherapy in 2018 for mesothelioma.  Was in observation for 2 years.  Repeat scans in June 2020 showed recurrent disease in left side of chest.  He was started on systemic chemotherapy.  He has had issues with dehydration and weakness.  Received 1 L normal saline in oncology clinic on 08/24/2019.  Had an episode of syncope on 07/16/2019.  Was walking to his house from outside and fell as he was getting inside.  Went to the bathroom and had another syncopal episode.  He does not rememebr anything from episode.  Daughter states that it lasted 1-2 minutes, during which time he was not responsive.   When EMS arrived, normal glucose, BP was 150/100.  Has had presyncopal episodes where he gets cold and clammy previously.  Has had falls where he has been lightheaded and dizzy.   Most exertion is walking to mailbox and around the yard.  Denies any chest pain or dyspnea.  Denies any palpitations.     Past Medical History:  Diagnosis Date  . Cataract   . Depression    situational  . GERD (gastroesophageal reflux disease)   . Goals of care, counseling/discussion 03/20/2017  . Hyperlipidemia   . Hypothyroidism   . Reactive depression 03/20/2017  . Thyroid cancer ([place])    PMH of; on supressive therapy    Past Surgical History:  Procedure Laterality Date  . CATARACT EXTRACTION, BILATERAL  2013 & 2014   Dr Celene Squibb  . colon polyps  2002  & 2005   negative 2008; Dr Earlean Shawl  . COLONOSCOPY W/ POLYPECTOMY    . LUNG BIOPSY Left 02/15/2017   Procedure: LUNG BIOPSY;  Surgeon: Melrose Nakayama, MD;  Location: [facility];  Service: Thoracic;  Laterality: Left;  . PLEURAL EFFUSION DRAINAGE Left 02/15/2017   Procedure: DRAINAGE OF PLEURAL EFFUSION;  Surgeon: Melrose Nakayama, MD;  Location: [facility];  Service: Thoracic;  Laterality: Left;  . PORTACATH PLACEMENT N/A 03/25/2017   Procedure: INSERTION PORT-A-CATH, right internal jugular;  Surgeon: Melrose Nakayama, MD;  Location: [facility];  Service: Thoracic;  Laterality: N/A;  . THYROIDECTOMY  2001   S/P RAI  . VIDEO ASSISTED THORACOSCOPY Left 02/15/2017   Procedure: VIDEO ASSISTED THORACOSCOPY;  Surgeon: Melrose Nakayama, MD;  Location: [facility];  Service: Thoracic;  Laterality: Left;    Current Medications: Current Meds  Medication Sig  . aspirin 81 MG tablet Take 81 mg by mouth daily.  . bifidobacterium infantis (ALIGN) capsule Take by mouth.  . blood glucose meter kit and supplies KIT Use to check blood sugars daily. Dx Code-E11.9  . citalopram (CELEXA) 10 MG tablet Take 1 tablet (10 mg total) by mouth daily.  [place] Kitchen dexamethasone (DECADRON) 4 MG tablet 1 tablet p.o. twice daily the day before, day of and day after chemotherapy every 3 weeks.  . donepezil (ARICEPT) 10 MG tablet TAKE 1 TABLET BY MOUTH AT  BEDTIME  . folic acid (FOLVITE) 1 MG tablet Take 1 tablet by mouth once daily  . gabapentin (NEURONTIN) 300 MG capsule TAKE 1 CAPSULE BY MOUTH AT NIGHT  . glucose blood (COOL BLOOD GLUCOSE TEST STRIPS) test strip Use to check blood sugars daily. Dx code: E11.9  . Lancets 28G MISC Use to check blood sugars daily. Dx E11.9  . levothyroxine (SYNTHROID) 150 MCG tablet Take 1 tablet by mouth once daily  . lidocaine-prilocaine (EMLA) cream Apply 1 application topically as needed.  . meloxicam (MOBIC) 7.5 MG tablet Take 1 tablet by mouth once daily  . omeprazole (PRILOSEC) 20 MG capsule Take 1  capsule by mouth once daily  . prochlorperazine (COMPAZINE) 10 MG tablet Take 1 tablet (10 mg total) by mouth every 6 (six) hours as needed for nausea or vomiting.  . rosuvastatin (CRESTOR) 20 MG tablet Take 1 tablet by mouth once daily     Allergies:   Ativan [lorazepam], Fentanyl, and Tramadol   Social History   Socioeconomic History  . Marital status: Married    Spouse name: Not on file  . Number of children: 2  . Years of education: Not on file  . Highest education level: Not on file  Occupational History  . Occupation: Retired  Scientific laboratory technician  . Financial resource strain: Not on file  . Food insecurity    Worry: Not on file    Inability: Not on file  . Transportation needs    Medical: Not on file    Non-medical: Not on file  Tobacco Use  . Smoking status: Former Smoker    Packs/day: 1.00    Years: 5.00    Pack years: 5.00    Types: Cigarettes    Quit date: 12/07/1958    Years since quitting: 60.7  . Smokeless tobacco: Never Used  . Tobacco comment: smoked 1956-1960, up 1 ppd  Substance and Sexual Activity  . Alcohol use: Yes    Alcohol/week: 14.0 standard drinks    Types: 14 Glasses of wine per week  . Drug use: No  . Sexual activity: Not on file  Lifestyle  . Physical activity    Days per week: Not on file    Minutes per session: Not on file  . Stress: Not on file  Relationships  . Social Herbalist on phone: Not on file    Gets together: Not on file    Attends religious service: Not on file    Active member of club or organization: Not on file    Attends meetings of clubs or organizations: Not on file    Relationship status: Not on file  Other Topics Concern  . Not on file  Social History Narrative   Exercise:  Yard work, walks on occasion                 Family History: The patient's family history includes Alzheimer's disease in his mother; Cancer in his father; Heart disease in his father; Hypertension in his mother; Other in his  brother. There is no history of Diabetes or Stroke.  ROS:   Please see the history of present illness.     All other systems reviewed and are negative.  EKGs/Labs/Other Studies Reviewed:    The following studies were reviewed today:   EKG:  EKG is ordered today.  The ekg ordered today demonstrates NSR, rate 84, LAD, incomplete RBBB, QTc 460  Recent Labs: 07/18/2019: TSH 0.84 08/24/2019: ALT 11; BUN 15;  Creatinine 0.92; Hemoglobin 10.8; Platelet Count 141; Potassium 3.9; Sodium 143  Recent Lipid Panel    Component Value Date/Time   CHOL 152 01/17/2019 1405   TRIG 85.0 01/17/2019 1405   HDL 55.30 01/17/2019 1405   CHOLHDL 3 01/17/2019 1405   VLDL 17.0 01/17/2019 1405   LDLCALC 80 01/17/2019 1405    Physical Exam:    VS:  Pulse 84   Temp (!) 97 F (36.1 C) (Temporal)   Ht 6' (1.829 m)   Wt 196 lb 3.2 oz (89 kg)   SpO2 98%   BMI 26.61 kg/m     Wt Readings from Last 3 Encounters:  08/28/19 196 lb 3.2 oz (89 kg)  08/24/19 194 lb 14.4 oz (88.4 kg)  08/22/19 195 lb (88.5 kg)     GEN:   in no acute distress HEENT: Normal NECK: No JVD; No carotid bruits LYMPHATICS: No lymphadenopathy CARDIAC: RRR, no murmurs, rubs, gallops RESPIRATORY:  Clear to auscultation without rales, wheezing or rhonchi  ABDOMEN: Soft, non-tender, non-distended MUSCULOSKELETAL:  No edema; No deformity  SKIN: Warm and dry NEUROLOGIC:  Alert and oriented x 3 PSYCHIATRIC:  Normal affect   ASSESSMENT:    1. Syncope, unspecified syncope type   2. Orthostatic hypotension    PLAN:    In order of problems listed above:  Syncope: unclear etiology.  Patient has lightheadedness from orthostatic hypotension, but this episode sounded different as no position change, and was unconscious for 1-2 minutes during event.  Concerning for neurologiv etiology (brain MRI pending) but warrants evaluation for cardiac etiology, including structural heart disease or arrhythmia. PE also on ddx given malignancy, though  he denies any dyspnea - TTE - LE duplex to evaluate for DVT.  Hold off on CTPA for now given recent AKI - Zio patch x 2 weeks  Orthostatic hypotension: positive orthostatics in clinic today, BP 141/90 HR 81 sitting, BP 82/57 HR 96 standing.  He denied feeling lightheaded.  Not on any antihypertensives.  Suspect dehydration in setting of chemotherapy, encouraged patient to stay hydrated.    Medication Adjustments/Labs and Tests Ordered: Current medicines are reviewed at length with the patient today.  Concerns regarding medicines are outlined above.  Orders Placed This Encounter  Procedures  . LONG TERM MONITOR-LIVE TELEMETRY (3-14 DAYS)  . EKG 12-Lead  . ECHOCARDIOGRAM COMPLETE   No orders of the defined types were placed in this encounter.   Patient Instructions  Medication Instructions:  Your Physician recommend you continue on your current medication as directed.    If you need a refill on your cardiac medications before your next appointment, please call your pharmacy.   Lab work: None  Testing/Procedures: Your physician has requested that you have an echocardiogram. Echocardiography is a painless test that uses sound waves to create images of your heart. It provides your doctor with information about the size and shape of your heart and how well your heart's chambers and valves are working. This procedure takes approximately one hour. There are no restrictions for this procedure. [address] [place] [address]  Our physician has recommended that you wear an 14  DAY ZIO-PATCH monitor. The Zio patch cardiac monitor continuously records heart rhythm data for up to 14 days, this is for patients being evaluated for multiple types heart rhythms. For the first 24 hours post application, please avoid getting the Zio monitor wet in the shower or by excessive sweating during exercise. After that, feel free to carry on with regular activities.  Keep soaps and lotions away from the ZIO XT  Patch.         Follow-Up: Your physician recommends that you schedule a follow-up appointment as needed with Dr. Gardiner Rhyme.       Signed, Donato Heinz, MD  08/28/2019 4:02 PM    [facility]

## 2019-08-28 ENCOUNTER — Ambulatory Visit (INDEPENDENT_AMBULATORY_CARE_PROVIDER_SITE_OTHER): Payer: PPO | Admitting: Cardiology

## 2019-08-28 ENCOUNTER — Other Ambulatory Visit: Payer: Self-pay

## 2019-08-28 VITALS — HR 84 | Temp 97.0°F | Ht 72.0 in | Wt 196.2 lb

## 2019-08-28 DIAGNOSIS — I82409 Acute embolism and thrombosis of unspecified deep veins of unspecified lower extremity: Secondary | ICD-10-CM

## 2019-08-28 DIAGNOSIS — R55 Syncope and collapse: Secondary | ICD-10-CM

## 2019-08-28 DIAGNOSIS — I951 Orthostatic hypotension: Secondary | ICD-10-CM

## 2019-08-28 NOTE — Patient Instructions (Addendum)
Medication Instructions:  Your Physician recommend you continue on your current medication as directed.    If you need a refill on your cardiac medications before your next appointment, please call your pharmacy.   Lab work: None  Testing/Procedures: Your physician has requested that you have an echocardiogram. Echocardiography is a painless test that uses sound waves to create images of your heart. It provides your doctor with information about the size and shape of your heart and how well your heart's chambers and valves are working. This procedure takes approximately one hour. There are no restrictions for this procedure. [place] [address]  Our physician has recommended that you wear an 14  DAY ZIO-PATCH monitor. The Zio patch cardiac monitor continuously records heart rhythm data for up to 14 days, this is for patients being evaluated for multiple types heart rhythms. For the first 24 hours post application, please avoid getting the Zio monitor wet in the shower or by excessive sweating during exercise. After that, feel free to carry on with regular activities. Keep soaps and lotions away from the ZIO XT Patch.    Your physician has requested that you have a lower extremity venous duplex. This test is an ultrasound of the veins in the legs or arms. It looks at venous blood flow that carries blood from the heart to the legs or arms. Allow one hour for a Lower Venous exam. Allow thirty minutes for an Upper Venous exam. There are no restrictions or special instructions. [place]          Follow-Up: Your physician recommends that you schedule a follow-up appointment as needed with Dr. Gardiner Rhyme.

## 2019-08-30 ENCOUNTER — Other Ambulatory Visit: Payer: Self-pay

## 2019-08-30 ENCOUNTER — Encounter (INDEPENDENT_AMBULATORY_CARE_PROVIDER_SITE_OTHER): Payer: Self-pay

## 2019-08-30 ENCOUNTER — Ambulatory Visit: Payer: PPO | Admitting: Dietician

## 2019-08-30 ENCOUNTER — Ambulatory Visit (HOSPITAL_COMMUNITY): Payer: PPO | Attending: Cardiology

## 2019-08-30 DIAGNOSIS — R55 Syncope and collapse: Secondary | ICD-10-CM

## 2019-08-31 ENCOUNTER — Other Ambulatory Visit: Payer: Self-pay

## 2019-08-31 ENCOUNTER — Inpatient Hospital Stay (HOSPITAL_BASED_OUTPATIENT_CLINIC_OR_DEPARTMENT_OTHER): Payer: PPO | Admitting: Physician Assistant

## 2019-08-31 ENCOUNTER — Inpatient Hospital Stay: Payer: PPO

## 2019-08-31 ENCOUNTER — Encounter: Payer: Self-pay | Admitting: Physician Assistant

## 2019-08-31 VITALS — BP 154/96 | HR 65 | Temp 98.2°F | Resp 17 | Ht 73.0 in | Wt 197.7 lb

## 2019-08-31 DIAGNOSIS — C450 Mesothelioma of pleura: Secondary | ICD-10-CM

## 2019-08-31 LAB — CBC WITH DIFFERENTIAL (CANCER CENTER ONLY)
Abs Immature Granulocytes: 0.18 10*3/uL — ABNORMAL HIGH (ref 0.00–0.07)
Basophils Absolute: 0 10*3/uL (ref 0.0–0.1)
Basophils Relative: 1 %
Eosinophils Absolute: 0 10*3/uL (ref 0.0–0.5)
Eosinophils Relative: 1 %
HCT: 34.4 % — ABNORMAL LOW (ref 39.0–52.0)
Hemoglobin: 11.4 g/dL — ABNORMAL LOW (ref 13.0–17.0)
Immature Granulocytes: 5 %
Lymphocytes Relative: 32 %
Lymphs Abs: 1.2 10*3/uL (ref 0.7–4.0)
MCH: 36 pg — ABNORMAL HIGH (ref 26.0–34.0)
MCHC: 33.1 g/dL (ref 30.0–36.0)
MCV: 108.5 fL — ABNORMAL HIGH (ref 80.0–100.0)
Monocytes Absolute: 0.8 10*3/uL (ref 0.1–1.0)
Monocytes Relative: 22 %
Neutro Abs: 1.5 10*3/uL — ABNORMAL LOW (ref 1.7–7.7)
Neutrophils Relative %: 39 %
Platelet Count: 96 10*3/uL — ABNORMAL LOW (ref 150–400)
RBC: 3.17 MIL/uL — ABNORMAL LOW (ref 4.22–5.81)
RDW: 18.6 % — ABNORMAL HIGH (ref 11.5–15.5)
WBC Count: 3.8 10*3/uL — ABNORMAL LOW (ref 4.0–10.5)
nRBC: 0.5 % — ABNORMAL HIGH (ref 0.0–0.2)

## 2019-08-31 LAB — CMP (CANCER CENTER ONLY)
ALT: 11 U/L (ref 0–44)
AST: 39 U/L (ref 15–41)
Albumin: 3.4 g/dL — ABNORMAL LOW (ref 3.5–5.0)
Alkaline Phosphatase: 79 U/L (ref 38–126)
Anion gap: 7 (ref 5–15)
BUN: 20 mg/dL (ref 8–23)
CO2: 27 mmol/L (ref 22–32)
Calcium: 8.1 mg/dL — ABNORMAL LOW (ref 8.9–10.3)
Chloride: 110 mmol/L (ref 98–111)
Creatinine: 0.96 mg/dL (ref 0.61–1.24)
GFR, Est AFR Am: >60 mL/min (ref >60)
GFR, Estimated: >60 mL/min (ref >60)
Glucose, Bld: 132 mg/dL — ABNORMAL HIGH (ref 70–99)
Potassium: 4.1 mmol/L (ref 3.5–5.1)
Sodium: 144 mmol/L (ref 135–145)
Total Bilirubin: 0.7 mg/dL (ref 0.3–1.2)
Total Protein: 5.6 g/dL — ABNORMAL LOW (ref 6.5–8.1)

## 2019-08-31 NOTE — Progress Notes (Signed)
[facility] OFFICE PROGRESS NOTE  [name], MD [place] [address]  DIAGNOSIS: Stage II malignant pleural mesothelioma, epithelioid type involving the left hemithorax diagnosed in March 2018.  PRIOR THERAPY: 1) Status post left VATS with biopsy and wedge resection of the left lower lobe as well as parietal pleurectomy under the care of Dr. Roxan Hockey on 02/15/2017. 2) Palliative systemic chemotherapy with carboplatin for AUC of 5, Alimta 500 MG/M2 and Avastin 15 MG/KG every 3 weeks. Status post 6 cycles.  The patient has been on observation for almost 2 years.  CURRENT THERAPY: Resuming systemic chemotherapy with carboplatin for AUC of 5, Alimta 500 mg/M2 and Avastin 15 mg/KG every 3 weeks.  First dose May 31, 2019.  Status post 4 cycles.  INTERVAL HISTORY: [place] 85 y.o. male returns to the clinic for a follow up visit. The patient is feeling well today without any concerning complaints. The patient continues to tolerate treatment with carboplatin, alimta, and avastin well without any adverse effects except for lab abnormalities. The patient was neutropenic at his last appointment and received granix injections. His treatment was delayed by 1 week secondary to neutropenia. Denies any fever, chills, night sweats, or weight loss. Denies any shortness of breath, cough, or hemoptysis. The patient states that he has mild pain in the center of his chest when he takes a deep breath. Denies any nausea, vomiting, diarrhea, or constipation. Denies any headache or visual changes. Denies any rashes or skin changes. The patient has dementia and his daughter lives with him. His daughter was available by phone during his visit today. The patient is here today for evaluation prior to starting cycle # 5  MEDICAL HISTORY: Past Medical History:  Diagnosis Date  . Cataract   . Depression    situational  . GERD (gastroesophageal reflux disease)   . Goals of  care, counseling/discussion 03/20/2017  . Hyperlipidemia   . Hypothyroidism   . Reactive depression 03/20/2017  . Thyroid cancer ([place])    PMH of; on supressive therapy    ALLERGIES:  is allergic to ativan [lorazepam]; fentanyl; and tramadol.  MEDICATIONS:  Current Outpatient Medications  Medication Sig Dispense Refill  . aspirin 81 MG tablet Take 81 mg by mouth daily.    . bifidobacterium infantis (ALIGN) capsule Take by mouth.    . blood glucose meter kit and supplies KIT Use to check blood sugars daily. Dx Code-E11.9 1 each 0  . citalopram (CELEXA) 10 MG tablet Take 1 tablet (10 mg total) by mouth daily. 30 tablet 5  . dexamethasone (DECADRON) 4 MG tablet 1 tablet p.o. twice daily the day before, day of and day after chemotherapy every 3 weeks. 40 tablet 1  . donepezil (ARICEPT) 10 MG tablet TAKE 1 TABLET BY MOUTH AT BEDTIME 90 tablet 0  . folic acid (FOLVITE) 1 MG tablet Take 1 tablet by mouth once daily 30 tablet 0  . gabapentin (NEURONTIN) 300 MG capsule TAKE 1 CAPSULE BY MOUTH AT NIGHT 90 capsule 1  . glucose blood (COOL BLOOD GLUCOSE TEST STRIPS) test strip Use to check blood sugars daily. Dx code: E11.9 100 each 1  . Lancets 28G MISC Use to check blood sugars daily. Dx E11.9 100 each 1  . levothyroxine (SYNTHROID) 150 MCG tablet Take 1 tablet by mouth once daily 90 tablet 1  . lidocaine-prilocaine (EMLA) cream Apply 1 application topically as needed. 30 g 0  . meloxicam (MOBIC) 7.5 MG tablet Take  1 tablet by mouth once daily 90 tablet 0  . omeprazole (PRILOSEC) 20 MG capsule Take 1 capsule by mouth once daily 90 capsule 1  . prochlorperazine (COMPAZINE) 10 MG tablet Take 1 tablet (10 mg total) by mouth every 6 (six) hours as needed for nausea or vomiting. 30 tablet 0  . rosuvastatin (CRESTOR) 20 MG tablet Take 1 tablet by mouth once daily 90 tablet 0   No current facility-administered medications for this visit.     SURGICAL HISTORY:  Past Surgical History:  Procedure  Laterality Date  . CATARACT EXTRACTION, BILATERAL  2013 & 2014   Dr Celene Squibb  . colon polyps  2002 & 2005   negative 2008; Dr Earlean Shawl  . COLONOSCOPY W/ POLYPECTOMY    . LUNG BIOPSY Left 02/15/2017   Procedure: LUNG BIOPSY;  Surgeon: Melrose Nakayama, MD;  Location: [facility];  Service: Thoracic;  Laterality: Left;  . PLEURAL EFFUSION DRAINAGE Left 02/15/2017   Procedure: DRAINAGE OF PLEURAL EFFUSION;  Surgeon: Melrose Nakayama, MD;  Location: [facility];  Service: Thoracic;  Laterality: Left;  . PORTACATH PLACEMENT N/A 03/25/2017   Procedure: INSERTION PORT-A-CATH, right internal jugular;  Surgeon: Melrose Nakayama, MD;  Location: [facility];  Service: Thoracic;  Laterality: N/A;  . THYROIDECTOMY  2001   S/P RAI  . VIDEO ASSISTED THORACOSCOPY Left 02/15/2017   Procedure: VIDEO ASSISTED THORACOSCOPY;  Surgeon: Melrose Nakayama, MD;  Location: [facility];  Service: Thoracic;  Laterality: Left;    REVIEW OF SYSTEMS:   Review of Systems  Constitutional: Negative for appetite change, chills, fatigue, fever and unexpected weight change.  HENT:   Negative for mouth sores, nosebleeds, sore throat and trouble swallowing.   Eyes: Negative for eye problems and icterus.  Respiratory: Negative for cough, hemoptysis, shortness of breath and wheezing.   Cardiovascular: Positive for mild pleuritic chest pain. Negative for leg swelling.  Gastrointestinal: Negative for abdominal pain, constipation, diarrhea, nausea and vomiting.  Genitourinary: Negative for bladder incontinence, difficulty urinating, dysuria, frequency and hematuria.   Musculoskeletal: Negative for back pain, gait problem, neck pain and neck stiffness.  Skin: Negative for itching and rash.  Neurological: Negative for dizziness, extremity weakness, gait problem, headaches, light-headedness and seizures.  Hematological: Negative for adenopathy. Does not bruise/bleed easily.  Psychiatric/Behavioral: Positive for dementia. Negative for depression and  sleep disturbance. The patient is not nervous/anxious.     PHYSICAL EXAMINATION:  Blood pressure (!) 154/96, pulse 65, temperature 98.2 F (36.8 C), temperature source Temporal, resp. rate 17, height _0  (1.854 m), weight 197 lb 11.2 oz (89.7 kg), SpO2 99 %.  ECOG PERFORMANCE STATUS: 1 - Symptomatic but completely ambulatory  Physical Exam  Constitutional: Oriented to person, place, and time and well-developed, well-nourished, and in no distress.  HENT:  Head: Normocephalic and atraumatic.  Mouth/Throat: Oropharynx is clear and moist. No oropharyngeal exudate.  Eyes: Conjunctivae are normal. Right eye exhibits no discharge. Left eye exhibits no discharge. No scleral icterus.  Neck: Normal range of motion. Neck supple.  Cardiovascular: Normal rate, regular rhythm, normal heart sounds and intact distal pulses.   Pulmonary/Chest: Effort normal and breath sounds normal. No respiratory distress. No wheezes. No rales.  Abdominal: Soft. Bowel sounds are normal. Exhibits no distension and no mass. There is no tenderness.  Musculoskeletal: Normal range of motion. Exhibits no edema.  Lymphadenopathy:    No cervical adenopathy.  Neurological: Alert and oriented to person, place, and time. Exhibits normal muscle tone. Gait normal. Coordination normal.  Skin: Skin is warm and dry. No rash noted. Not diaphoretic. No erythema. No pallor.  Psychiatric: Mood, memory and judgment normal.  Vitals reviewed.  LABORATORY DATA: Lab Results  Component Value Date   WBC 3.8 (L) 08/31/2019   HGB 11.4 (L) 08/31/2019   HCT 34.4 (L) 08/31/2019   MCV 108.5 (H) 08/31/2019   PLT 96 (L) 08/31/2019      Chemistry      Component Value Date/Time   NA 144 08/31/2019 1258   NA 140 11/04/2017 1210   K 4.1 08/31/2019 1258   K 4.1 11/04/2017 1210   CL 110 08/31/2019 1258   CO2 27 08/31/2019 1258   CO2 26 11/04/2017 1210   BUN 20 08/31/2019 1258   BUN 20.1 11/04/2017 1210   CREATININE 0.96 08/31/2019 1258    CREATININE 1.2 11/04/2017 1210      Component Value Date/Time   CALCIUM 8.1 (L) 08/31/2019 1258   CALCIUM 9.6 11/04/2017 1210   ALKPHOS 79 08/31/2019 1258   ALKPHOS 62 11/04/2017 1210   AST 39 08/31/2019 1258   AST 37 (H) 11/04/2017 1210   ALT 11 08/31/2019 1258   ALT 22 11/04/2017 1210   BILITOT 0.7 08/31/2019 1258   BILITOT 1.32 (H) 11/04/2017 1210       RADIOGRAPHIC STUDIES:  Ct Chest W Contrast  Result Date: 08/01/2019 CLINICAL DATA:  Patient with history of mesothelioma diagnosed in 2018 status post chemotherapy. EXAM: CT CHEST, ABDOMEN, AND PELVIS WITH CONTRAST TECHNIQUE: Multidetector CT imaging of the chest, abdomen and pelvis was performed following the standard protocol during bolus administration of intravenous contrast. CONTRAST:  163m OMNIPAQUE IOHEXOL 300 MG/ML  SOLN COMPARISON:  CT chest 05/19/2019; CT CAP 02/04/2018 FINDINGS: CT CHEST FINDINGS Cardiovascular: Right anterior chest wall Port-A-Cath is present with tip terminating in the superior vena cava. Heart is mildly enlarged. Trace fluid superior pericardial recess. Thoracic aortic vascular calcifications. Similar-appearing dilation of the ascending thoracic aorta measuring 4.2 cm. Mediastinum/Nodes: No enlarged axillary, mediastinal or hilar lymphadenopathy. Normal appearance of the esophagus. Lungs/Pleura: Central airways are patent. Similar-appearing peripheral scarring within the lingula and left lower lobe. No large area pulmonary consolidation. No pleural effusion or pneumothorax. Interval decrease in size of 7 mm pleural based nodule left upper hemithorax, previously 9 mm (image 12; series 2). Interval decrease in size of 6 mm pleural based nodule along the superior mediastinum (image 21; series 2), previously 8 mm. Interval decrease in size of 13 x 7 mm pleural-based nodule along the mediastinum (image 32; series 2), previously 18 x 11 mm. Interval decrease in size of 1.3 cm lesion adjacent to the cardiac ventricle  (image 44; series 2), previously 2.5 cm. Interval decrease in size of nodularity along the left fissure measuring approximately 0.7 cm (image 27; series 2), previously 2.2 cm. Musculoskeletal: Thoracic spine degenerative changes. No aggressive or acute appearing osseous lesions. CT ABDOMEN PELVIS FINDINGS Hepatobiliary: The liver is normal in size and contour. No focal hepatic lesion is identified. Gallbladder is unremarkable. No intrahepatic or extrahepatic biliary ductal dilatation. Pancreas: Unremarkable Spleen: Unremarkable Adrenals/Urinary Tract: Normal adrenal glands. Kidneys enhance symmetrically with contrast. No hydronephrosis. Urinary bladder is unremarkable. Stomach/Bowel: Sigmoid colonic diverticulosis. No CT evidence for acute diverticulitis. Normal appendix. No evidence for bowel obstruction. No free fluid or free intraperitoneal air. Normal morphology of the stomach. Vascular/Lymphatic: Infrarenal abdominal aorta measures 3.0 cm. No retroperitoneal lymphadenopathy. Reproductive: Heterogeneous prostate. Other: Small bilateral fat containing inguinal hernias. Musculoskeletal: Lumbar spine degenerative changes. No aggressive or acute  appearing osseous lesions. IMPRESSION: 1. Interval decrease in size of pleural based nodularity within the left hemithorax. 2. No evidence for metastasis within the abdomen or pelvis. 3. Abdominal aorta measures 3.0 cm. Recommend followup by ultrasound in 3 years. This recommendation follows ACR consensus guidelines: White Paper of the ACR Incidental Findings Committee II on Vascular Findings. J Am Coll Radiol 2013; 10:789-794. Aortic aneurysm NOS (ICD10-I71.9) 4. Similar-appearing dilated ascending thoracic aorta. Recommend attention on follow-up. Electronically Signed   By: Lovey Newcomer M.D.   On: 08/01/2019 16:20   Ct Abdomen Pelvis W Contrast  Result Date: 08/01/2019 CLINICAL DATA:  Patient with history of mesothelioma diagnosed in 2018 status post chemotherapy. EXAM:  CT CHEST, ABDOMEN, AND PELVIS WITH CONTRAST TECHNIQUE: Multidetector CT imaging of the chest, abdomen and pelvis was performed following the standard protocol during bolus administration of intravenous contrast. CONTRAST:  12m OMNIPAQUE IOHEXOL 300 MG/ML  SOLN COMPARISON:  CT chest 05/19/2019; CT CAP 02/04/2018 FINDINGS: CT CHEST FINDINGS Cardiovascular: Right anterior chest wall Port-A-Cath is present with tip terminating in the superior vena cava. Heart is mildly enlarged. Trace fluid superior pericardial recess. Thoracic aortic vascular calcifications. Similar-appearing dilation of the ascending thoracic aorta measuring 4.2 cm. Mediastinum/Nodes: No enlarged axillary, mediastinal or hilar lymphadenopathy. Normal appearance of the esophagus. Lungs/Pleura: Central airways are patent. Similar-appearing peripheral scarring within the lingula and left lower lobe. No large area pulmonary consolidation. No pleural effusion or pneumothorax. Interval decrease in size of 7 mm pleural based nodule left upper hemithorax, previously 9 mm (image 12; series 2). Interval decrease in size of 6 mm pleural based nodule along the superior mediastinum (image 21; series 2), previously 8 mm. Interval decrease in size of 13 x 7 mm pleural-based nodule along the mediastinum (image 32; series 2), previously 18 x 11 mm. Interval decrease in size of 1.3 cm lesion adjacent to the cardiac ventricle (image 44; series 2), previously 2.5 cm. Interval decrease in size of nodularity along the left fissure measuring approximately 0.7 cm (image 27; series 2), previously 2.2 cm. Musculoskeletal: Thoracic spine degenerative changes. No aggressive or acute appearing osseous lesions. CT ABDOMEN PELVIS FINDINGS Hepatobiliary: The liver is normal in size and contour. No focal hepatic lesion is identified. Gallbladder is unremarkable. No intrahepatic or extrahepatic biliary ductal dilatation. Pancreas: Unremarkable Spleen: Unremarkable Adrenals/Urinary  Tract: Normal adrenal glands. Kidneys enhance symmetrically with contrast. No hydronephrosis. Urinary bladder is unremarkable. Stomach/Bowel: Sigmoid colonic diverticulosis. No CT evidence for acute diverticulitis. Normal appendix. No evidence for bowel obstruction. No free fluid or free intraperitoneal air. Normal morphology of the stomach. Vascular/Lymphatic: Infrarenal abdominal aorta measures 3.0 cm. No retroperitoneal lymphadenopathy. Reproductive: Heterogeneous prostate. Other: Small bilateral fat containing inguinal hernias. Musculoskeletal: Lumbar spine degenerative changes. No aggressive or acute appearing osseous lesions. IMPRESSION: 1. Interval decrease in size of pleural based nodularity within the left hemithorax. 2. No evidence for metastasis within the abdomen or pelvis. 3. Abdominal aorta measures 3.0 cm. Recommend followup by ultrasound in 3 years. This recommendation follows ACR consensus guidelines: White Paper of the ACR Incidental Findings Committee II on Vascular Findings. J Am Coll Radiol 2013; 10:789-794. Aortic aneurysm NOS (ICD10-I71.9) 4. Similar-appearing dilated ascending thoracic aorta. Recommend attention on follow-up. Electronically Signed   By: DLovey NewcomerM.D.   On: 08/01/2019 16:20     ASSESSMENT/PLAN:  This is a very pleasant 85year old Caucasian male diagnosed with malignant pleural mesothelioma involving the left hemithorax.  He was diagnosed in March 2018.  He is status post  a left VATS with biopsy and wedge resection of the left lower lobe as well as parietal pleurectomy under the care of Dr. Roxan Hockey in March 2018.  He completed treatment with systemic chemotherapy with carboplatin, alimta, and Avastin.  He is status post 6 cycles.  He has been on observation for almost 2 years  The patient showed evidence of disease progression in June 2020.  He recently restarted systemic chemotherapy with carboplatin for an AUC of 5, Alimta 500 mg/m, and Avastin 15 mg/kg IV  every 3 weeks.  He is status post 4 cycles.  He has been tolerating treatment fairly well without any concerning adverse side effects except for lab abnormalities.  The patient was seen with Dr. Julien Nordmann today. The patient's daughter was available by phone. The patient's son is the medical power of attorney. The patient is requesting that the patient take a break from treatment until 09/21/2019 due to taking him to a trip to the beach. Dr. Julien Nordmann spoke to the patient and his daughter to ensure that they understand that the cancer can grow without having treatment for 6 weeks. The patient and his daughter understand the risks of taking a break from treatment. The patient was agreeable to take a break from treatment.   The patient will not receive treatment today.   We will see the patient back for a follow up visit in 3 weeks for evaluation before starting cycle #4.   The patient was advised to call immediately if he has any concerning symptoms in the interval. The patient voices understanding of current disease status and treatment options and is in agreement with the current care plan. All questions were answered. The patient knows to call the clinic with any problems, questions or concerns. We can certainly see the patient much sooner if necessary  No orders of the defined types were placed in this encounter.    Cassandra L Heilingoetter, PA-C 08/31/19  ADDENDUM: Hematology/Oncology Attending: I had a face-to-face encounter with the patient today.  I recommended his care plan.  This is a very pleasant 85 years old white male with recurrent malignant pleural mesothelioma.  The patient is currently undergoing systemic chemotherapy with carboplatin, Alimta and Avastin every 3 weeks status post 4 cycles.  He has been tolerating his treatment well and the last imaging studies showed improvement of his disease.  He was supposed to start cycle #5 today but his daughter would like to delay his  treatment by 3 weeks because she has planned for repeat trip and she would like to take her father with her.  The patient himself was interested in proceeding with the treatment but at the end he had to agree with his daughter to skip treatment today. We will see him back for follow-up visit in 3 weeks for reevaluation before resuming his treatment with cycle #5. He was advised to call immediately if he has any concerning symptoms in the interval.  Disclaimer: This note was dictated with voice recognition software. Similar sounding words can inadvertently be transcribed and may be missed upon review. Eilleen Kempf, MD 08/31/19

## 2019-09-01 ENCOUNTER — Other Ambulatory Visit: Payer: Self-pay

## 2019-09-01 ENCOUNTER — Ambulatory Visit (HOSPITAL_COMMUNITY)
Admission: RE | Admit: 2019-09-01 | Discharge: 2019-09-01 | Disposition: A | Discharge status: Home / Self Care | Payer: PPO | Source: Ambulatory Visit | Attending: Cardiology | Admitting: Cardiology

## 2019-09-01 ENCOUNTER — Telehealth: Payer: Self-pay

## 2019-09-01 DIAGNOSIS — I82409 Acute embolism and thrombosis of unspecified deep veins of unspecified lower extremity: Secondary | ICD-10-CM

## 2019-09-01 NOTE — Telephone Encounter (Signed)
Spoke to pt's Daughter Cecille Rubin, verified address. Pt is going out of town tomorrow for 2 weeks. I will have monitor mailed on 09/15/2019. 14 day ZIO-AT ordered to be delivered to pt's home.

## 2019-09-05 ENCOUNTER — Telehealth: Payer: Self-pay | Admitting: Cardiology

## 2019-09-05 NOTE — Telephone Encounter (Signed)
New Message   Patient calling for echo results.

## 2019-09-06 NOTE — Telephone Encounter (Signed)
Left a message for the patient to call back.   Notes recorded by Donato Heinz, MD on 09/01/2019 at 10:55 AM EDT  Nothing on echo to explain syncopal episode. Echo significant for aortic aneurysm, which has been followed on his CT scans and has been stable.

## 2019-09-08 NOTE — Progress Notes (Signed)
The following biosimilar Zirabev (bevacizumab-bvzr) has been selected for use in this patient.  Demetrius Charity, PharmD, [facility] Phone: [phone] 09/08/2019

## 2019-09-08 NOTE — Telephone Encounter (Signed)
Notes recorded by Meryl Crutch, RN on 09/06/2019 at 11:37 AM EDT  Daughter updated and voiced understanding.

## 2019-09-14 ENCOUNTER — Ambulatory Visit: Payer: PPO

## 2019-09-14 ENCOUNTER — Ambulatory Visit: Payer: PPO | Admitting: Internal Medicine

## 2019-09-14 ENCOUNTER — Other Ambulatory Visit: Payer: PPO

## 2019-09-18 ENCOUNTER — Other Ambulatory Visit: Payer: PPO

## 2019-09-18 ENCOUNTER — Ambulatory Visit
Admission: RE | Admit: 2019-09-18 | Discharge: 2019-09-18 | Disposition: A | Discharge status: Home / Self Care | Payer: PPO | Source: Ambulatory Visit | Attending: Neurology | Admitting: Neurology

## 2019-09-18 ENCOUNTER — Other Ambulatory Visit: Payer: Self-pay

## 2019-09-18 DIAGNOSIS — G3184 Mild cognitive impairment, so stated: Secondary | ICD-10-CM

## 2019-09-18 DIAGNOSIS — F0390 Unspecified dementia without behavioral disturbance: Secondary | ICD-10-CM

## 2019-09-18 DIAGNOSIS — C73 Malignant neoplasm of thyroid gland: Secondary | ICD-10-CM | POA: Diagnosis not present

## 2019-09-18 DIAGNOSIS — R9082 White matter disease, unspecified: Secondary | ICD-10-CM | POA: Diagnosis not present

## 2019-09-18 DIAGNOSIS — R410 Disorientation, unspecified: Secondary | ICD-10-CM

## 2019-09-18 DIAGNOSIS — G3189 Other specified degenerative diseases of nervous system: Secondary | ICD-10-CM | POA: Diagnosis not present

## 2019-09-18 MED ORDER — GADOBENATE DIMEGLUMINE 529 MG/ML IV SOLN
20.0000 mL | Freq: Once | INTRAVENOUS | Status: AC | PRN
  Administered 2019-09-18: 11:00:00 20 mL via INTRAVENOUS

## 2019-09-19 ENCOUNTER — Telehealth: Payer: Self-pay | Admitting: *Deleted

## 2019-09-19 ENCOUNTER — Telehealth: Payer: Self-pay | Admitting: Neurology

## 2019-09-19 DIAGNOSIS — I4510 Unspecified right bundle-branch block: Secondary | ICD-10-CM | POA: Diagnosis not present

## 2019-09-19 DIAGNOSIS — R0989 Other specified symptoms and signs involving the circulatory and respiratory systems: Secondary | ICD-10-CM | POA: Diagnosis not present

## 2019-09-19 DIAGNOSIS — C3490 Malignant neoplasm of unspecified part of unspecified bronchus or lung: Secondary | ICD-10-CM | POA: Diagnosis not present

## 2019-09-19 DIAGNOSIS — R61 Generalized hyperhidrosis: Secondary | ICD-10-CM | POA: Diagnosis not present

## 2019-09-19 DIAGNOSIS — R7401 Elevation of levels of liver transaminase levels: Secondary | ICD-10-CM | POA: Diagnosis not present

## 2019-09-19 DIAGNOSIS — I6782 Cerebral ischemia: Secondary | ICD-10-CM | POA: Diagnosis not present

## 2019-09-19 DIAGNOSIS — Z87891 Personal history of nicotine dependence: Secondary | ICD-10-CM | POA: Diagnosis not present

## 2019-09-19 DIAGNOSIS — F0390 Unspecified dementia without behavioral disturbance: Secondary | ICD-10-CM | POA: Diagnosis not present

## 2019-09-19 DIAGNOSIS — Z79899 Other long term (current) drug therapy: Secondary | ICD-10-CM | POA: Diagnosis not present

## 2019-09-19 DIAGNOSIS — Z5181 Encounter for therapeutic drug level monitoring: Secondary | ICD-10-CM | POA: Diagnosis not present

## 2019-09-19 DIAGNOSIS — R809 Proteinuria, unspecified: Secondary | ICD-10-CM | POA: Diagnosis not present

## 2019-09-19 DIAGNOSIS — R4182 Altered mental status, unspecified: Secondary | ICD-10-CM | POA: Diagnosis not present

## 2019-09-19 DIAGNOSIS — D696 Thrombocytopenia, unspecified: Secondary | ICD-10-CM | POA: Diagnosis not present

## 2019-09-19 DIAGNOSIS — D649 Anemia, unspecified: Secondary | ICD-10-CM | POA: Diagnosis not present

## 2019-09-19 DIAGNOSIS — R410 Disorientation, unspecified: Secondary | ICD-10-CM | POA: Diagnosis not present

## 2019-09-19 NOTE — Telephone Encounter (Signed)
-----   Message from Pieter Partridge, DO sent at 09/18/2019  3:10 PM EDT ----- MRI shows no evidence of a recent stroke, tumor, bleed or other acute intracranial abnormality.

## 2019-09-19 NOTE — Telephone Encounter (Signed)
Called daughter and relayed below MRI information.  She stated she has called police and issued a silver alert as her dad left the house 8 hours ago. Memory is getting worse.

## 2019-09-19 NOTE — Telephone Encounter (Signed)
See next result/telephone note.

## 2019-09-19 NOTE — Telephone Encounter (Signed)
Patient's daughter Dean Pruitt called regarding her dad Dean Pruitt. She said that he left the house this morning and took her Cell phone with him as well as one of the house phones. She would like you to please call her at [phone]. She said she was to be getting a call with his MRI results. Please Call. Thanks

## 2019-09-20 ENCOUNTER — Telehealth: Payer: Self-pay | Admitting: Medical Oncology

## 2019-09-20 NOTE — Telephone Encounter (Signed)
Returned call to dtr.  Silver alert- known vascular dementia-Dean Pruitt was reported a silver alert yesterday-He was missing for 8 hours .He drove to [facility] , stopped in [place] to get gas and was found at some remote apartments in [place] on doors. He had CT head at [facility]. Dtr thought she had all his car keys. I told dtr she can come with pt tomorrow. She stated "I don't think he needs to get anymore treatment " CT-Brain at [facility]  "IMPRESSION:  1. No acute intracranial abnormality. 2. Chronic diffuse parenchymal brain volume loss and small vessel ischemic white matter change. NOTE: Acute infarction may not be detected by CT scan for 24-48 hours. If clinically indicated, follow up brain CT or MR is suggested."

## 2019-09-21 ENCOUNTER — Inpatient Hospital Stay: Payer: PPO

## 2019-09-21 ENCOUNTER — Encounter: Payer: Self-pay | Admitting: Physician Assistant

## 2019-09-21 ENCOUNTER — Inpatient Hospital Stay: Payer: PPO | Admitting: Physician Assistant

## 2019-09-21 ENCOUNTER — Other Ambulatory Visit: Payer: Self-pay

## 2019-09-21 ENCOUNTER — Inpatient Hospital Stay: Payer: PPO | Attending: Internal Medicine

## 2019-09-21 ENCOUNTER — Telehealth: Payer: Self-pay | Admitting: Medical Oncology

## 2019-09-21 VITALS — BP 147/92 | HR 66 | Temp 98.5°F | Resp 16 | Ht 73.0 in | Wt 201.3 lb

## 2019-09-21 DIAGNOSIS — R9082 White matter disease, unspecified: Secondary | ICD-10-CM | POA: Diagnosis not present

## 2019-09-21 DIAGNOSIS — R0781 Pleurodynia: Secondary | ICD-10-CM | POA: Diagnosis not present

## 2019-09-21 DIAGNOSIS — R071 Chest pain on breathing: Secondary | ICD-10-CM | POA: Diagnosis not present

## 2019-09-21 DIAGNOSIS — E785 Hyperlipidemia, unspecified: Secondary | ICD-10-CM | POA: Diagnosis not present

## 2019-09-21 DIAGNOSIS — R5383 Other fatigue: Secondary | ICD-10-CM | POA: Diagnosis not present

## 2019-09-21 DIAGNOSIS — Z8719 Personal history of other diseases of the digestive system: Secondary | ICD-10-CM | POA: Insufficient documentation

## 2019-09-21 DIAGNOSIS — Z888 Allergy status to other drugs, medicaments and biological substances status: Secondary | ICD-10-CM | POA: Diagnosis not present

## 2019-09-21 DIAGNOSIS — Z5112 Encounter for antineoplastic immunotherapy: Secondary | ICD-10-CM | POA: Diagnosis not present

## 2019-09-21 DIAGNOSIS — Z5111 Encounter for antineoplastic chemotherapy: Secondary | ICD-10-CM | POA: Diagnosis not present

## 2019-09-21 DIAGNOSIS — Z885 Allergy status to narcotic agent status: Secondary | ICD-10-CM | POA: Insufficient documentation

## 2019-09-21 DIAGNOSIS — F329 Major depressive disorder, single episode, unspecified: Secondary | ICD-10-CM | POA: Insufficient documentation

## 2019-09-21 DIAGNOSIS — C450 Mesothelioma of pleura: Secondary | ICD-10-CM

## 2019-09-21 DIAGNOSIS — I6782 Cerebral ischemia: Secondary | ICD-10-CM | POA: Insufficient documentation

## 2019-09-21 DIAGNOSIS — C73 Malignant neoplasm of thyroid gland: Secondary | ICD-10-CM | POA: Diagnosis not present

## 2019-09-21 DIAGNOSIS — Z79899 Other long term (current) drug therapy: Secondary | ICD-10-CM | POA: Insufficient documentation

## 2019-09-21 DIAGNOSIS — E039 Hypothyroidism, unspecified: Secondary | ICD-10-CM | POA: Insufficient documentation

## 2019-09-21 DIAGNOSIS — Z85850 Personal history of malignant neoplasm of thyroid: Secondary | ICD-10-CM | POA: Insufficient documentation

## 2019-09-21 DIAGNOSIS — F0150 Vascular dementia without behavioral disturbance: Secondary | ICD-10-CM | POA: Insufficient documentation

## 2019-09-21 LAB — CBC WITH DIFFERENTIAL (CANCER CENTER ONLY)
Abs Immature Granulocytes: 0.02 10*3/uL (ref 0.00–0.07)
Basophils Absolute: 0.1 10*3/uL (ref 0.0–0.1)
Basophils Relative: 1 %
Eosinophils Absolute: 0.3 10*3/uL (ref 0.0–0.5)
Eosinophils Relative: 6 %
HCT: 40.3 % (ref 39.0–52.0)
Hemoglobin: 13.2 g/dL (ref 13.0–17.0)
Immature Granulocytes: 0 %
Lymphocytes Relative: 29 %
Lymphs Abs: 1.3 10*3/uL (ref 0.7–4.0)
MCH: 36.5 pg — ABNORMAL HIGH (ref 26.0–34.0)
MCHC: 32.8 g/dL (ref 30.0–36.0)
MCV: 111.3 fL — ABNORMAL HIGH (ref 80.0–100.0)
Monocytes Absolute: 0.9 10*3/uL (ref 0.1–1.0)
Monocytes Relative: 20 %
Neutro Abs: 2 10*3/uL (ref 1.7–7.7)
Neutrophils Relative %: 44 %
Platelet Count: 131 10*3/uL — ABNORMAL LOW (ref 150–400)
RBC: 3.62 MIL/uL — ABNORMAL LOW (ref 4.22–5.81)
RDW: 15 % (ref 11.5–15.5)
WBC Count: 4.5 10*3/uL (ref 4.0–10.5)
nRBC: 0 % (ref 0.0–0.2)

## 2019-09-21 LAB — CMP (CANCER CENTER ONLY)
ALT: 24 U/L (ref 0–44)
AST: 54 U/L — ABNORMAL HIGH (ref 15–41)
Albumin: 3.7 g/dL (ref 3.5–5.0)
Alkaline Phosphatase: 74 U/L (ref 38–126)
Anion gap: 8 (ref 5–15)
BUN: 16 mg/dL (ref 8–23)
CO2: 27 mmol/L (ref 22–32)
Calcium: 8.8 mg/dL — ABNORMAL LOW (ref 8.9–10.3)
Chloride: 108 mmol/L (ref 98–111)
Creatinine: 1 mg/dL (ref 0.61–1.24)
GFR, Est AFR Am: >60 mL/min (ref >60)
GFR, Estimated: >60 mL/min (ref >60)
Glucose, Bld: 107 mg/dL — ABNORMAL HIGH (ref 70–99)
Potassium: 4.7 mmol/L (ref 3.5–5.1)
Sodium: 143 mmol/L (ref 135–145)
Total Bilirubin: 0.6 mg/dL (ref 0.3–1.2)
Total Protein: 6.3 g/dL — ABNORMAL LOW (ref 6.5–8.1)

## 2019-09-21 LAB — TOTAL PROTEIN, URINE DIPSTICK: Protein, ur: <30 mg/dL

## 2019-09-21 MED ORDER — CYANOCOBALAMIN 1000 MCG/ML IJ SOLN
1000.0000 ug | Freq: Once | INTRAMUSCULAR | Status: AC
  Administered 2019-09-21: 1000 ug via INTRAMUSCULAR

## 2019-09-21 MED ORDER — DIPHENHYDRAMINE HCL 25 MG PO TABS
25.0000 mg | ORAL_TABLET | Freq: Once | ORAL | Status: AC
  Administered 2019-09-21: 12:00:00 25 mg via ORAL
  Filled 2019-09-21: qty 1

## 2019-09-21 MED ORDER — FAMOTIDINE IN NACL 20-0.9 MG/50ML-% IV SOLN
INTRAVENOUS | Status: AC
  Filled 2019-09-21: qty 50

## 2019-09-21 MED ORDER — SODIUM CHLORIDE 0.9% FLUSH
10.0000 mL | INTRAVENOUS | Status: DC | PRN
  Administered 2019-09-21: 16:00:00 10 mL
  Filled 2019-09-21: qty 10

## 2019-09-21 MED ORDER — CYANOCOBALAMIN 1000 MCG/ML IJ SOLN
INTRAMUSCULAR | Status: AC
  Filled 2019-09-21: qty 1

## 2019-09-21 MED ORDER — HEPARIN SOD (PORK) LOCK FLUSH 100 UNIT/ML IV SOLN
500.0000 [IU] | Freq: Once | INTRAVENOUS | Status: AC | PRN
  Administered 2019-09-21: 500 [IU]
  Filled 2019-09-21: qty 5

## 2019-09-21 MED ORDER — PALONOSETRON HCL INJECTION 0.25 MG/5ML
0.2500 mg | Freq: Once | INTRAVENOUS | Status: AC
  Administered 2019-09-21: 12:00:00 0.25 mg via INTRAVENOUS

## 2019-09-21 MED ORDER — SODIUM CHLORIDE 0.9 % IV SOLN
15.0000 mg/kg | Freq: Once | INTRAVENOUS | Status: AC
  Administered 2019-09-21: 1400 mg via INTRAVENOUS
  Filled 2019-09-21: qty 48

## 2019-09-21 MED ORDER — PALONOSETRON HCL INJECTION 0.25 MG/5ML
INTRAVENOUS | Status: AC
  Filled 2019-09-21: qty 5

## 2019-09-21 MED ORDER — DIPHENHYDRAMINE HCL 25 MG PO CAPS
ORAL_CAPSULE | ORAL | Status: AC
  Filled 2019-09-21: qty 1

## 2019-09-21 MED ORDER — FAMOTIDINE IN NACL 20-0.9 MG/50ML-% IV SOLN
20.0000 mg | Freq: Once | INTRAVENOUS | Status: AC
  Administered 2019-09-21: 20 mg via INTRAVENOUS

## 2019-09-21 MED ORDER — SODIUM CHLORIDE 0.9 % IV SOLN
Freq: Once | INTRAVENOUS | Status: AC
  Administered 2019-09-21: 13:00:00 via INTRAVENOUS
  Filled 2019-09-21: qty 5

## 2019-09-21 MED ORDER — SODIUM CHLORIDE 0.9 % IV SOLN: 400.0000 mg/m2 | Freq: Once | INTRAVENOUS | Status: DC

## 2019-09-21 MED ORDER — SODIUM CHLORIDE 0.9 % IV SOLN
392.8000 mg | Freq: Once | INTRAVENOUS | Status: AC
  Administered 2019-09-21: 15:00:00 390 mg via INTRAVENOUS
  Filled 2019-09-21: qty 39

## 2019-09-21 MED ORDER — SODIUM CHLORIDE 0.9 % IV SOLN
Freq: Once | INTRAVENOUS | Status: AC
  Administered 2019-09-21: 12:00:00 via INTRAVENOUS
  Filled 2019-09-21: qty 250

## 2019-09-21 MED ORDER — SODIUM CHLORIDE 0.9 % IV SOLN
375.0000 mg/m2 | Freq: Once | INTRAVENOUS | Status: AC
  Administered 2019-09-21: 800 mg via INTRAVENOUS
  Filled 2019-09-21: qty 20

## 2019-09-21 NOTE — Patient Instructions (Signed)
[facility] Discharge Instructions for Patients Receiving Chemotherapy  Today you received the following chemotherapy agents: bevacizumab, pemetrexed, and carboplatin.  To help prevent nausea and vomiting after your treatment, we encourage you to take your nausea medication as directed.   If you develop nausea and vomiting that is not controlled by your nausea medication, call the clinic.   BELOW ARE SYMPTOMS THAT SHOULD BE REPORTED IMMEDIATELY:  *FEVER GREATER THAN 100.5 F  *CHILLS WITH OR WITHOUT FEVER  NAUSEA AND VOMITING THAT IS NOT CONTROLLED WITH YOUR NAUSEA MEDICATION  *UNUSUAL SHORTNESS OF BREATH  *UNUSUAL BRUISING OR BLEEDING  TENDERNESS IN MOUTH AND THROAT WITH OR WITHOUT PRESENCE OF ULCERS  *URINARY PROBLEMS  *BOWEL PROBLEMS  UNUSUAL RASH Items with * indicate a potential emergency and should be followed up as soon as possible.  Feel free to call the clinic should you have any questions or concerns. The clinic phone number is [phone].  Please show the CHEMO ALERT CARD at check-in to the Emergency Department and triage nurse.   

## 2019-09-21 NOTE — Progress Notes (Signed)
[name] OFFICE PROGRESS NOTE  Dean Rail, MD Dean Pruitt [place] [address]  DIAGNOSIS: Stage II malignant pleural mesothelioma, epithelioid type involving the left hemithorax diagnosed in March 2018.  PRIOR THERAPY: 1) Status post left VATS with biopsy and wedge resection of the left lower lobe as well as parietal pleurectomy under the care of Dr. Roxan Pruitt on 02/15/2017. 2) Palliative systemic chemotherapy with carboplatin for AUC of 5, Alimta 500 MG/M2 and Avastin 15 MG/KG every 3 weeks. Status post 6 cycles. The patient has been on observation for almost 2 years.  CURRENT THERAPY: Resuming systemic chemotherapy with carboplatin for AUC of 5, Alimta 500 mg/M2 and Avastin 15 mg/KG every 3 weeks. First dose May 31, 2019. Status post 4cycles.  INTERVAL HISTORY: Dean Pruitt 85 y.o. male returns to the clinic for a follow-up visit. The patient's most recent chemotherapy treatment was on 08/03/2019.  The patient had missed several appointments in the interval and the patient's family had taken the patient to the beach. We had a discussion with the patient's family that skipping several weeks of treatment can have an adverse effect on the cancer. They expressed understanding. He is here today to discuss resuming treatment. Of note, he has vascular dementia and lives with his daughter. The patient reportedly took car keys and went missing for 8 hours and ended up in [place]. He went to the ER afterwards and a head CT was unremarkable except for known small vessel ischemic white matter changes.  The patient is feeling well today without any concerning complaints.  He denies any fever, chills, night sweats, or weight loss.  He occasionally experiences pleuritic chest pain with deep breathing.  Otherwise he denies any shortness of breath, cough, or hemoptysis.  He denies any nausea, vomiting, diarrhea, or constipation.  He denies any headache or visual changes. He is  here today for evaluation before starting cycle #5 of his treatment.    MEDICAL HISTORY: Past Medical History:  Diagnosis Date  . Cataract   . Depression    situational  . GERD (gastroesophageal reflux disease)   . Goals of care, counseling/discussion 03/20/2017  . Hyperlipidemia   . Hypothyroidism   . Reactive depression 03/20/2017  . Thyroid cancer (Dean Pruitt)    PMH of; on supressive therapy    ALLERGIES:  is allergic to ativan [lorazepam]; fentanyl; and tramadol.  MEDICATIONS:  Current Outpatient Medications  Medication Sig Dispense Refill  . aspirin 81 MG tablet Take 81 mg by mouth daily.    . bifidobacterium infantis (ALIGN) capsule Take by mouth.    . blood glucose meter kit and supplies KIT Use to check blood sugars daily. Dx Code-E11.9 1 each 0  . citalopram (CELEXA) 10 MG tablet Take 1 tablet (10 mg total) by mouth daily. 30 tablet 5  . dexamethasone (DECADRON) 4 MG tablet 1 tablet p.o. twice daily the day before, day of and day after chemotherapy every 3 weeks. 40 tablet 1  . donepezil (ARICEPT) 10 MG tablet TAKE 1 TABLET BY MOUTH AT BEDTIME 90 tablet 0  . folic acid (FOLVITE) 1 MG tablet Take 1 tablet by mouth once daily 30 tablet 0  . gabapentin (NEURONTIN) 300 MG capsule TAKE 1 CAPSULE BY MOUTH AT NIGHT 90 capsule 1  . glucose blood (COOL BLOOD GLUCOSE TEST STRIPS) test strip Use to check blood sugars daily. Dx code: E11.9 100 each 1  . Lancets 28G MISC Use to check blood sugars daily. Dx E11.9 100  each 1  . levothyroxine (SYNTHROID) 150 MCG tablet Take 1 tablet by mouth once daily 90 tablet 1  . lidocaine-prilocaine (EMLA) cream Apply 1 application topically as needed. 30 g 0  . meloxicam (MOBIC) 7.5 MG tablet Take 1 tablet by mouth once daily 90 tablet 0  . omeprazole (PRILOSEC) 20 MG capsule Take 1 capsule by mouth once daily 90 capsule 1  . prochlorperazine (COMPAZINE) 10 MG tablet Take 1 tablet (10 mg total) by mouth every 6 (six) hours as needed for nausea or  vomiting. 30 tablet 0  . rosuvastatin (CRESTOR) 20 MG tablet Take 1 tablet by mouth once daily 90 tablet 0   No current facility-administered medications for this visit.    Facility-Administered Medications Ordered in Other Visits  Medication Dose Route Frequency Provider Last Rate Last Dose  . bevacizumab-bvzr (ZIRABEV) 1,400 mg in sodium chloride 0.9 % 100 mL chemo infusion  15 mg/kg (Treatment Plan Recorded) Intravenous Once Dean Bears, MD      . CARBOplatin (PARAPLATIN) 390 mg in sodium chloride 0.9 % 250 mL chemo infusion  390 mg Intravenous Once Dean Bears, MD      . cyanocobalamin ((VITAMIN B-12)) injection 1,000 mcg  1,000 mcg Intramuscular Once Dean Pruitt      . famotidine (PEPCID) IVPB 20 mg premix  20 mg Intravenous Once Dean Bears, MD 200 mL/hr at 09/21/19 1229 20 mg at 09/21/19 1229  . fosaprepitant (EMEND) 150 mg, dexamethasone (DECADRON) 12 mg in sodium chloride 0.9 % 145 mL IVPB   Intravenous Once Dean Bears, MD      . heparin lock flush 100 unit/mL  500 Units Intracatheter Once PRN Dean Bears, MD      . PEMEtrexed (ALIMTA) 850 mg in sodium chloride 0.9 % 100 mL chemo infusion  400 mg/m2 (Treatment Plan Recorded) Intravenous Once Dean Bears, MD      . sodium chloride flush (NS) 0.9 % injection 10 mL  10 mL Intracatheter PRN Dean Bears, MD        SURGICAL HISTORY:  Past Surgical History:  Procedure Laterality Date  . CATARACT EXTRACTION, BILATERAL  2013 & 2014   Dr Dean Pruitt  . colon polyps  2002 & 2005   negative 2008; Dr Dean Pruitt  . COLONOSCOPY W/ POLYPECTOMY    . LUNG BIOPSY Left 02/15/2017   Procedure: LUNG BIOPSY;  Surgeon: Dean Nakayama, MD;  Location: [facility];  Service: Thoracic;  Laterality: Left;  . PLEURAL EFFUSION DRAINAGE Left 02/15/2017   Procedure: DRAINAGE OF PLEURAL EFFUSION;  Surgeon: Dean Nakayama, MD;  Location: [facility];  Service: Thoracic;  Laterality: Left;  . PORTACATH PLACEMENT N/A  03/25/2017   Procedure: INSERTION PORT-A-CATH, right internal jugular;  Surgeon: Dean Nakayama, MD;  Location: [facility];  Service: Thoracic;  Laterality: N/A;  . THYROIDECTOMY  2001   S/P RAI  . VIDEO ASSISTED THORACOSCOPY Left 02/15/2017   Procedure: VIDEO ASSISTED THORACOSCOPY;  Surgeon: Dean Nakayama, MD;  Location: [facility];  Service: Thoracic;  Laterality: Left;    REVIEW OF SYSTEMS:   Review of Systems  Constitutional: Negative for appetite change, chills, fatigue, fever and unexpected weight change.  HENT: Negative for mouth sores, nosebleeds, sore throat and trouble swallowing.   Eyes: Negative for eye problems and icterus.  Respiratory: Negative for cough, hemoptysis, shortness of breath and wheezing.   Cardiovascular: Negative for chest pain and leg swelling.  Gastrointestinal: Negative for abdominal pain, constipation, diarrhea, nausea and vomiting.  Genitourinary: Negative  for bladder incontinence, difficulty urinating, dysuria, frequency and hematuria.   Musculoskeletal: Negative for back pain, gait problem, neck pain and neck stiffness.  Skin: Negative for itching and rash.  Neurological: Negative for dizziness, extremity weakness, gait problem, headaches, light-headedness and seizures.  Hematological: Negative for adenopathy. Does not bruise/bleed easily.  Psychiatric/Behavioral: Positive for dementia. Negative for depression and sleep disturbance. The patient is not nervous/anxious.     PHYSICAL EXAMINATION:  Blood pressure (!) 147/92, pulse 66, temperature 98.5 F (36.9 C), temperature source Temporal, resp. rate 16, height '6\' 1"'  (1.854 m), weight 201 lb 4.8 oz (91.3 kg), SpO2 99 %.  ECOG PERFORMANCE STATUS: 1 - Symptomatic but completely ambulatory  Physical Exam  Constitutional: Oriented to person, place, and time and well-developed, well-nourished, and in no distress.  HENT:  Head: Normocephalic and atraumatic.  Mouth/Throat: Oropharynx is clear and moist.  No oropharyngeal exudate.  Eyes: Conjunctivae are normal. Right eye exhibits no discharge. Left eye exhibits no discharge. No scleral icterus.  Neck: Normal range of motion. Neck supple.  Cardiovascular: Normal rate, regular rhythm, normal heart sounds and intact distal pulses.   Pulmonary/Chest: Effort normal and breath sounds normal. No respiratory distress. No wheezes. No rales.  Abdominal: Soft. Bowel sounds are normal. Exhibits no distension and no mass. There is no tenderness.  Musculoskeletal: Normal range of motion. Exhibits no edema.  Lymphadenopathy:    No cervical adenopathy.  Neurological: Alert and oriented to person, place, and time. Exhibits normal muscle tone. Gait normal. Coordination normal.  Skin: Skin is warm and dry. No rash noted. Not diaphoretic. No erythema. No pallor.  Psychiatric: Mood pleasant. Patient has dementia.   Vitals reviewed.  LABORATORY DATA: Lab Results  Component Value Date   WBC 4.5 09/21/2019   HGB 13.2 09/21/2019   HCT 40.3 09/21/2019   MCV 111.3 (H) 09/21/2019   PLT 131 (L) 09/21/2019      Chemistry      Component Value Date/Time   NA 143 09/21/2019 1047   NA 140 11/04/2017 1210   K 4.7 09/21/2019 1047   K 4.1 11/04/2017 1210   CL 108 09/21/2019 1047   CO2 27 09/21/2019 1047   CO2 26 11/04/2017 1210   BUN 16 09/21/2019 1047   BUN 20.1 11/04/2017 1210   CREATININE 1.00 09/21/2019 1047   CREATININE 1.2 11/04/2017 1210      Component Value Date/Time   CALCIUM 8.8 (L) 09/21/2019 1047   CALCIUM 9.6 11/04/2017 1210   ALKPHOS 74 09/21/2019 1047   ALKPHOS 62 11/04/2017 1210   AST 54 (H) 09/21/2019 1047   AST 37 (H) 11/04/2017 1210   ALT 24 09/21/2019 1047   ALT 22 11/04/2017 1210   BILITOT 0.6 09/21/2019 1047   BILITOT 1.32 (H) 11/04/2017 1210       RADIOGRAPHIC STUDIES:  Mr Jeri Cos XB Contrast  Result Date: 09/18/2019 CLINICAL DATA:  Acute confusion. Personal history of malignant pleural mesothelioma. Thyroid cancer. EXAM:  MRI HEAD WITHOUT AND WITH CONTRAST TECHNIQUE: Multiplanar, multiecho pulse sequences of the brain and surrounding structures were obtained without and with intravenous contrast. CONTRAST:  21m MULTIHANCE GADOBENATE DIMEGLUMINE 529 MG/ML IV SOLN COMPARISON:  MR head without and with contrast 07/19/2017 FINDINGS: Brain: Advanced atrophy and white matter disease is again seen. No acute infarct, hemorrhage, or mass lesion is present. The ventricles are proportionate to the degree of atrophy. No significant extraaxial fluid collection is present. The internal auditory canals are within normal limits. The brainstem and cerebellum are  within normal limits. Postcontrast images demonstrate no pathologic enhancement. Vascular: Flow is present in the major intracranial arteries. Skull and upper cervical spine: The craniocervical junction is normal. Upper cervical spine is within normal limits. Marrow signal is unremarkable. Sinuses/Orbits: The paranasal sinuses and mastoid air cells are clear. Bilateral lens replacements are noted. Globes and orbits are otherwise unremarkable. IMPRESSION: 1. Advanced atrophy and white matter disease. 2. No acute focal intracranial abnormality to explain confusion or dementia otherwise. Electronically Signed   By: San Morelle M.D.   On: 09/18/2019 14:45   Vas [place] Lower Extremity Venous (dvt)  Result Date: 09/02/2019  Lower Venous Study Other Indications: Patient denies any shortness of breath, no leg pain and no                    swelling. Risk Factors: Cancer Yes. Performing Technologist: Wilkie Aye RVT  Examination Guidelines: A complete evaluation includes B-mode imaging, spectral Doppler, color Doppler, and power Doppler as needed of all accessible portions of each vessel. Bilateral testing is considered an integral part of a complete examination. Limited examinations for reoccurring indications may be performed as noted.   +---------+---------------+---------+-----------+----------+--------------+ RIGHT    CompressibilityPhasicitySpontaneityPropertiesThrombus Aging +---------+---------------+---------+-----------+----------+--------------+ CFV      Full           Yes      Yes                                 +---------+---------------+---------+-----------+----------+--------------+ SFJ      Full           Yes      Yes                                 +---------+---------------+---------+-----------+----------+--------------+ FV Prox  Full           Yes      Yes                                 +---------+---------------+---------+-----------+----------+--------------+ FV Mid   Full           Yes      Yes                                 +---------+---------------+---------+-----------+----------+--------------+ FV DistalFull           Yes      Yes                                 +---------+---------------+---------+-----------+----------+--------------+ PFV      Full                                                        +---------+---------------+---------+-----------+----------+--------------+ POP      Full           Yes      Yes                                 +---------+---------------+---------+-----------+----------+--------------+  PTV      Full           Yes      Yes                                 +---------+---------------+---------+-----------+----------+--------------+ PERO     Full           Yes      Yes                                 +---------+---------------+---------+-----------+----------+--------------+ Gastroc  Full                                                        +---------+---------------+---------+-----------+----------+--------------+ GSV      Full           Yes      Yes                                 +---------+---------------+---------+-----------+----------+--------------+    +---------+---------------+---------+-----------+----------+--------------+ LEFT     CompressibilityPhasicitySpontaneityPropertiesThrombus Aging +---------+---------------+---------+-----------+----------+--------------+ CFV      Full           Yes      Yes                                 +---------+---------------+---------+-----------+----------+--------------+ SFJ      Full           Yes      Yes                                 +---------+---------------+---------+-----------+----------+--------------+ FV Prox  Full           Yes      Yes                                 +---------+---------------+---------+-----------+----------+--------------+ FV Mid   Full           Yes      Yes                                 +---------+---------------+---------+-----------+----------+--------------+ FV DistalFull           Yes      Yes                                 +---------+---------------+---------+-----------+----------+--------------+ PFV      Full                                                        +---------+---------------+---------+-----------+----------+--------------+ POP      Full           Yes  Yes                                 +---------+---------------+---------+-----------+----------+--------------+ PTV      Full           Yes      Yes                                 +---------+---------------+---------+-----------+----------+--------------+ PERO     Full           Yes      Yes                                 +---------+---------------+---------+-----------+----------+--------------+ Gastroc  Full                                                        +---------+---------------+---------+-----------+----------+--------------+ GSV      Full           Yes      Yes                                 +---------+---------------+---------+-----------+----------+--------------+  Summary: Right: Abnormal reflux times were noted  in the popliteal vein. There is no evidence of deep vein thrombosis in the lower extremity. No cystic structure found in the popliteal fossa. Left: Abnormal reflux times were noted in the popliteal vein. There is no evidence of deep vein thrombosis in the lower extremity. No cystic structure found in the popliteal fossa.  *See table(s) above for measurements and observations. Electronically signed by Carlyle Dolly MD on 09/02/2019 at 4:47:56 PM.    Final      ASSESSMENT/PLAN:  This is a very pleasant 85 year old Caucasian male diagnosed with malignant pleural mesothelioma involving the left hemithorax. He was diagnosed in March 2018.  He is status postaleft VATS with biopsy and wedge resection of the left lower lobe as well as parietal pleurectomy under the care of Dr. Juliet Rude March 2018.  He completed treatment with systemic chemotherapy with carboplatin, alimta, and Avastin. He is status post 6 cycles. He has been on observation for almost 2 years  The patientshowedevidence of disease progression in June 2020. He recently restarted systemic chemotherapy with carboplatin for an AUCof 5, Alimta 533m/m, and Avastin 15 mg/kg IV every 3 weeks. He is status post 4 cycles. He has been tolerating treatment fairly well without any concerning adverse side effects except for lab abnormalities requiring granix injections.  The patient was seen with Dr. MJulien Nordmanntoday.  Labs were reviewed.  We recommend that the patient proceed with cycle #5 today as scheduled.  We will plan on ordering a restaging CT scan after cycle #6.  We will see the patient back for follow-up visit in 3 weeks for evaluation before starting cycle #6.  The patient was advised to call immediately if he has any concerning symptoms in the interval. The patient voices understanding of current disease status and treatment options and is in agreement with the current care plan. All questions were answered. The patient  knows to call the clinic  with any problems, questions or concerns. We can certainly see the patient much sooner if necessary.    Orders Placed This Encounter  Procedures  . Total Protein, Urine dipstick     Tivis Wherry L Yaasir Menken, Pruitt 09/21/19  ADDENDUM: Hematology/Oncology Attending: I had a face-to-face encounter with the patient today.  I recommended his care plan.  This is a very pleasant 85 years old white male with history of malignant pleural mesothelioma.  He is currently undergoing systemic chemotherapy with carboplatin, Alimta and Avastin status post 4 cycles.  The patient has been tolerating this treatment well with no concerning adverse effects except for fatigue.  He has some memory issues and confusions at times.  He drove his car unpurposely to [facility] few days ago.  The patient also spent 2 weeks on the beach with his family and he enjoyed it.  Is here today for reevaluation and to resume his treatment with chemotherapy.  He was accompanied by his daughter today. He denied having any concerning complaints. I recommended for him to proceed with cycle #5 today as planned.  We will complete a total of 6 cycles of his treatment followed by imaging work-up and we may consider the patient for a break of treatment if he is feeling fine. He will come back for follow-up visit in 3 weeks for evaluation before starting cycle #6. The patient was advised to call immediately if he has any concerning symptoms in the interval.  Disclaimer: This note was dictated with voice recognition software. Similar sounding words can inadvertently be transcribed and may be missed upon review. Eilleen Kempf, MD 09/21/19

## 2019-09-21 NOTE — Telephone Encounter (Signed)
Returned [employer]. She is bringing pt to visit.

## 2019-09-22 ENCOUNTER — Telehealth: Payer: Self-pay | Admitting: Internal Medicine

## 2019-09-22 NOTE — Telephone Encounter (Signed)
Confirmed with dtr appointments for November and December.

## 2019-10-02 ENCOUNTER — Other Ambulatory Visit: Payer: Self-pay | Admitting: Internal Medicine

## 2019-10-02 DIAGNOSIS — Z5111 Encounter for antineoplastic chemotherapy: Secondary | ICD-10-CM

## 2019-10-02 DIAGNOSIS — Z95828 Presence of other vascular implants and grafts: Secondary | ICD-10-CM

## 2019-10-02 DIAGNOSIS — C450 Mesothelioma of pleura: Secondary | ICD-10-CM

## 2019-10-02 DIAGNOSIS — E7849 Other hyperlipidemia: Secondary | ICD-10-CM

## 2019-10-11 ENCOUNTER — Encounter: Payer: Self-pay | Admitting: *Deleted

## 2019-10-11 ENCOUNTER — Other Ambulatory Visit: Payer: Self-pay

## 2019-10-11 ENCOUNTER — Inpatient Hospital Stay: Payer: PPO

## 2019-10-11 ENCOUNTER — Emergency Department (HOSPITAL_COMMUNITY)
Admission: EM | Admit: 2019-10-11 | Discharge: 2019-10-11 | Disposition: A | Discharge status: Home / Self Care | Payer: PPO | Attending: Emergency Medicine | Admitting: Emergency Medicine

## 2019-10-11 ENCOUNTER — Emergency Department (HOSPITAL_COMMUNITY): Payer: PPO

## 2019-10-11 ENCOUNTER — Inpatient Hospital Stay: Payer: PPO | Attending: Internal Medicine | Admitting: Physician Assistant

## 2019-10-11 ENCOUNTER — Encounter (HOSPITAL_COMMUNITY): Payer: Self-pay

## 2019-10-11 VITALS — BP 139/97 | HR 87 | Temp 98.0°F | Resp 17 | Ht 73.0 in | Wt 201.8 lb

## 2019-10-11 DIAGNOSIS — Z888 Allergy status to other drugs, medicaments and biological substances status: Secondary | ICD-10-CM | POA: Insufficient documentation

## 2019-10-11 DIAGNOSIS — Z5111 Encounter for antineoplastic chemotherapy: Secondary | ICD-10-CM | POA: Insufficient documentation

## 2019-10-11 DIAGNOSIS — C450 Mesothelioma of pleura: Secondary | ICD-10-CM | POA: Insufficient documentation

## 2019-10-11 DIAGNOSIS — E039 Hypothyroidism, unspecified: Secondary | ICD-10-CM | POA: Diagnosis not present

## 2019-10-11 DIAGNOSIS — Z85850 Personal history of malignant neoplasm of thyroid: Secondary | ICD-10-CM | POA: Insufficient documentation

## 2019-10-11 DIAGNOSIS — Z8719 Personal history of other diseases of the digestive system: Secondary | ICD-10-CM | POA: Insufficient documentation

## 2019-10-11 DIAGNOSIS — Z7982 Long term (current) use of aspirin: Secondary | ICD-10-CM | POA: Insufficient documentation

## 2019-10-11 DIAGNOSIS — Z885 Allergy status to narcotic agent status: Secondary | ICD-10-CM | POA: Insufficient documentation

## 2019-10-11 DIAGNOSIS — Z818 Family history of other mental and behavioral disorders: Secondary | ICD-10-CM | POA: Diagnosis not present

## 2019-10-11 DIAGNOSIS — E119 Type 2 diabetes mellitus without complications: Secondary | ICD-10-CM | POA: Insufficient documentation

## 2019-10-11 DIAGNOSIS — F0390 Unspecified dementia without behavioral disturbance: Secondary | ICD-10-CM | POA: Diagnosis not present

## 2019-10-11 DIAGNOSIS — Z8052 Family history of malignant neoplasm of bladder: Secondary | ICD-10-CM | POA: Insufficient documentation

## 2019-10-11 DIAGNOSIS — R9431 Abnormal electrocardiogram [ECG] [EKG]: Secondary | ICD-10-CM | POA: Diagnosis not present

## 2019-10-11 DIAGNOSIS — I4510 Unspecified right bundle-branch block: Secondary | ICD-10-CM | POA: Diagnosis not present

## 2019-10-11 DIAGNOSIS — Z5112 Encounter for antineoplastic immunotherapy: Secondary | ICD-10-CM | POA: Diagnosis not present

## 2019-10-11 DIAGNOSIS — E1165 Type 2 diabetes mellitus with hyperglycemia: Secondary | ICD-10-CM | POA: Diagnosis not present

## 2019-10-11 DIAGNOSIS — R410 Disorientation, unspecified: Secondary | ICD-10-CM | POA: Diagnosis not present

## 2019-10-11 DIAGNOSIS — Z87891 Personal history of nicotine dependence: Secondary | ICD-10-CM | POA: Diagnosis not present

## 2019-10-11 DIAGNOSIS — R404 Transient alteration of awareness: Secondary | ICD-10-CM | POA: Diagnosis not present

## 2019-10-11 DIAGNOSIS — R569 Unspecified convulsions: Secondary | ICD-10-CM | POA: Diagnosis not present

## 2019-10-11 DIAGNOSIS — E038 Other specified hypothyroidism: Secondary | ICD-10-CM

## 2019-10-11 DIAGNOSIS — Z79899 Other long term (current) drug therapy: Secondary | ICD-10-CM | POA: Diagnosis not present

## 2019-10-11 DIAGNOSIS — Z8249 Family history of ischemic heart disease and other diseases of the circulatory system: Secondary | ICD-10-CM | POA: Diagnosis not present

## 2019-10-11 DIAGNOSIS — R4020 Unspecified coma: Secondary | ICD-10-CM | POA: Diagnosis not present

## 2019-10-11 DIAGNOSIS — G40909 Epilepsy, unspecified, not intractable, without status epilepticus: Secondary | ICD-10-CM | POA: Diagnosis not present

## 2019-10-11 LAB — CBC WITH DIFFERENTIAL (CANCER CENTER ONLY)
Abs Immature Granulocytes: 0.01 10*3/uL (ref 0.00–0.07)
Basophils Absolute: 0 10*3/uL (ref 0.0–0.1)
Basophils Relative: 0 %
Eosinophils Absolute: 0 10*3/uL (ref 0.0–0.5)
Eosinophils Relative: 0 %
HCT: 42.3 % (ref 39.0–52.0)
Hemoglobin: 14.4 g/dL (ref 13.0–17.0)
Immature Granulocytes: 0 %
Lymphocytes Relative: 31 %
Lymphs Abs: 0.9 10*3/uL (ref 0.7–4.0)
MCH: 37 pg — ABNORMAL HIGH (ref 26.0–34.0)
MCHC: 34 g/dL (ref 30.0–36.0)
MCV: 108.7 fL — ABNORMAL HIGH (ref 80.0–100.0)
Monocytes Absolute: 0.5 10*3/uL (ref 0.1–1.0)
Monocytes Relative: 16 %
Neutro Abs: 1.5 10*3/uL — ABNORMAL LOW (ref 1.7–7.7)
Neutrophils Relative %: 53 %
Platelet Count: 142 10*3/uL — ABNORMAL LOW (ref 150–400)
RBC: 3.89 MIL/uL — ABNORMAL LOW (ref 4.22–5.81)
RDW: 13.4 % (ref 11.5–15.5)
WBC Count: 2.8 10*3/uL — ABNORMAL LOW (ref 4.0–10.5)
nRBC: 0 % (ref 0.0–0.2)

## 2019-10-11 LAB — CBC
HCT: 46.2 % (ref 39.0–52.0)
Hemoglobin: 15.2 g/dL (ref 13.0–17.0)
MCH: 36.6 pg — ABNORMAL HIGH (ref 26.0–34.0)
MCHC: 32.9 g/dL (ref 30.0–36.0)
MCV: 111.3 fL — ABNORMAL HIGH (ref 80.0–100.0)
Platelets: 134 10*3/uL — ABNORMAL LOW (ref 150–400)
RBC: 4.15 MIL/uL — ABNORMAL LOW (ref 4.22–5.81)
RDW: 13.6 % (ref 11.5–15.5)
WBC: 3.1 10*3/uL — ABNORMAL LOW (ref 4.0–10.5)
nRBC: 0 % (ref 0.0–0.2)

## 2019-10-11 LAB — BASIC METABOLIC PANEL
Anion gap: 15 (ref 5–15)
BUN: 17 mg/dL (ref 8–23)
CO2: 17 mmol/L — ABNORMAL LOW (ref 22–32)
Calcium: 9.3 mg/dL (ref 8.9–10.3)
Chloride: 107 mmol/L (ref 98–111)
Creatinine, Ser: 1.07 mg/dL (ref 0.61–1.24)
GFR calc Af Amer: >60 mL/min (ref >60)
GFR calc non Af Amer: >60 mL/min (ref >60)
Glucose, Bld: 229 mg/dL — ABNORMAL HIGH (ref 70–99)
Potassium: 4.1 mmol/L (ref 3.5–5.1)
Sodium: 139 mmol/L (ref 135–145)

## 2019-10-11 LAB — CMP (CANCER CENTER ONLY)
ALT: 20 U/L (ref 0–44)
AST: 36 U/L (ref 15–41)
Albumin: 4 g/dL (ref 3.5–5.0)
Alkaline Phosphatase: 65 U/L (ref 38–126)
Anion gap: 13 (ref 5–15)
BUN: 16 mg/dL (ref 8–23)
CO2: 23 mmol/L (ref 22–32)
Calcium: 9.2 mg/dL (ref 8.9–10.3)
Chloride: 105 mmol/L (ref 98–111)
Creatinine: 0.91 mg/dL (ref 0.61–1.24)
GFR, Est AFR Am: >60 mL/min (ref >60)
GFR, Estimated: >60 mL/min (ref >60)
Glucose, Bld: 122 mg/dL — ABNORMAL HIGH (ref 70–99)
Potassium: 4.2 mmol/L (ref 3.5–5.1)
Sodium: 141 mmol/L (ref 135–145)
Total Bilirubin: 0.6 mg/dL (ref 0.3–1.2)
Total Protein: 6.8 g/dL (ref 6.5–8.1)

## 2019-10-11 LAB — TOTAL PROTEIN, URINE DIPSTICK: Protein, ur: 30 mg/dL — AB

## 2019-10-11 LAB — CBG MONITORING, ED: Glucose-Capillary: 222 mg/dL — ABNORMAL HIGH (ref 70–99)

## 2019-10-11 MED ORDER — SODIUM CHLORIDE 0.9 % IV SOLN
375.0000 mg/m2 | Freq: Once | INTRAVENOUS | Status: AC
  Administered 2019-10-11: 800 mg via INTRAVENOUS
  Filled 2019-10-11: qty 20

## 2019-10-11 MED ORDER — SODIUM CHLORIDE 0.9 % IV SOLN
Freq: Once | INTRAVENOUS | Status: AC
  Administered 2019-10-11: 13:00:00 via INTRAVENOUS
  Filled 2019-10-11: qty 5

## 2019-10-11 MED ORDER — SODIUM CHLORIDE 0.9 % IV SOLN
15.0000 mg/kg | Freq: Once | INTRAVENOUS | Status: AC
  Administered 2019-10-11: 1400 mg via INTRAVENOUS
  Filled 2019-10-11: qty 48

## 2019-10-11 MED ORDER — HEPARIN SOD (PORK) LOCK FLUSH 100 UNIT/ML IV SOLN
500.0000 [IU] | Freq: Once | INTRAVENOUS | Status: AC | PRN
  Administered 2019-10-11: 500 [IU]
  Filled 2019-10-11: qty 5

## 2019-10-11 MED ORDER — PALONOSETRON HCL INJECTION 0.25 MG/5ML
INTRAVENOUS | Status: AC
  Filled 2019-10-11: qty 5

## 2019-10-11 MED ORDER — LEVETIRACETAM IN NACL 1000 MG/100ML IV SOLN
1000.0000 mg | Freq: Once | INTRAVENOUS | Status: AC
  Administered 2019-10-11: 1000 mg via INTRAVENOUS
  Filled 2019-10-11: qty 100

## 2019-10-11 MED ORDER — FAMOTIDINE IN NACL 20-0.9 MG/50ML-% IV SOLN
20.0000 mg | Freq: Once | INTRAVENOUS | Status: AC
  Administered 2019-10-11: 20 mg via INTRAVENOUS

## 2019-10-11 MED ORDER — LEVETIRACETAM 500 MG PO TABS: 500.0000 mg | ORAL_TABLET | Freq: Two times a day (BID) | ORAL | 0 refills | Status: AC

## 2019-10-11 MED ORDER — SODIUM CHLORIDE 0.9% FLUSH
10.0000 mL | INTRAVENOUS | Status: DC | PRN
  Administered 2019-10-11: 10 mL
  Filled 2019-10-11: qty 10

## 2019-10-11 MED ORDER — FAMOTIDINE IN NACL 20-0.9 MG/50ML-% IV SOLN
INTRAVENOUS | Status: AC
  Filled 2019-10-11: qty 50

## 2019-10-11 MED ORDER — DIPHENHYDRAMINE HCL 25 MG PO CAPS
ORAL_CAPSULE | ORAL | Status: AC
  Filled 2019-10-11: qty 1

## 2019-10-11 MED ORDER — PALONOSETRON HCL INJECTION 0.25 MG/5ML
0.2500 mg | Freq: Once | INTRAVENOUS | Status: AC
  Administered 2019-10-11: 0.25 mg via INTRAVENOUS

## 2019-10-11 MED ORDER — SODIUM CHLORIDE 0.9 % IV SOLN
Freq: Once | INTRAVENOUS | Status: AC
  Administered 2019-10-11: 13:00:00 via INTRAVENOUS
  Filled 2019-10-11: qty 250

## 2019-10-11 MED ORDER — DIPHENHYDRAMINE HCL 25 MG PO TABS
25.0000 mg | ORAL_TABLET | Freq: Once | ORAL | Status: AC
  Administered 2019-10-11: 25 mg via ORAL
  Filled 2019-10-11: qty 1

## 2019-10-11 MED ORDER — SODIUM CHLORIDE 0.9 % IV SOLN
390.0000 mg | Freq: Once | INTRAVENOUS | Status: AC
  Administered 2019-10-11: 390 mg via INTRAVENOUS
  Filled 2019-10-11: qty 39

## 2019-10-11 NOTE — ED Notes (Signed)
Walked in on patient urinating on floor. Cleaned and dried patient, and got him back into bed and on the monitors. Pt keeps trying to get out of bed unsupervised. Will continue to monitor.

## 2019-10-11 NOTE — ED Triage Notes (Signed)
Pt arrived through EMS. Pt is currently receiving chemo therapy from the cancer center and had a witnessed seizure, 1 min, today. Daughter witnessed. No hx of seizures but hx of dementia, pt currently at baseline (AOx3 usually). Daughter states that pt has been declining mentally for the last few weeks.

## 2019-10-11 NOTE — Progress Notes (Signed)
Decrease carbo to auc = 4 and alimta to 400mg /m2 per Rubin Payor, NP

## 2019-10-11 NOTE — Patient Instructions (Signed)
[facility] Discharge Instructions for Patients Receiving Chemotherapy  Today you received the following chemotherapy agents: bevacizumab, pemetrexed, and carboplatin.  To help prevent nausea and vomiting after your treatment, we encourage you to take your nausea medication as directed.   If you develop nausea and vomiting that is not controlled by your nausea medication, call the clinic.   BELOW ARE SYMPTOMS THAT SHOULD BE REPORTED IMMEDIATELY:  *FEVER GREATER THAN 100.5 F  *CHILLS WITH OR WITHOUT FEVER  NAUSEA AND VOMITING THAT IS NOT CONTROLLED WITH YOUR NAUSEA MEDICATION  *UNUSUAL SHORTNESS OF BREATH  *UNUSUAL BRUISING OR BLEEDING  TENDERNESS IN MOUTH AND THROAT WITH OR WITHOUT PRESENCE OF ULCERS  *URINARY PROBLEMS  *BOWEL PROBLEMS  UNUSUAL RASH Items with * indicate a potential emergency and should be followed up as soon as possible.  Feel free to call the clinic should you have any questions or concerns. The clinic phone number is [phone].  Please show the CHEMO ALERT CARD at check-in to the Emergency Department and triage nurse.   

## 2019-10-11 NOTE — ED Notes (Signed)
This RN was called into the room by CT tech to find patient slid down in the bed. Pt has pulled his monitor and gown off. Pt also pulled out IV and it is unclear how much of the Keppra the pt actually received.

## 2019-10-11 NOTE — ED Provider Notes (Addendum)
[place] DEPT Provider Note   CSN: [id number] Arrival date & time: 10/11/19  1945     History   Chief Complaint Chief Complaint  Patient presents with   Seizures    HPI Dean Pruitt is a 85 y.o. male.     Patient has dementia and is getting chemo for mesothelioma.  Patient had a seizure today.  The history is provided by the patient and a relative.  Seizures Seizure activity on arrival: yes   Seizure type:  Grand mal Preceding symptoms: no sensation of an aura present   Initial focality:  None Episode characteristics: abnormal movements   Postictal symptoms: confusion   Return to baseline: yes   Severity:  Moderate Timing:  Once Progression:  Resolved   Past Medical History:  Diagnosis Date   Cataract    Depression    situational   GERD (gastroesophageal reflux disease)    Goals of care, counseling/discussion 03/20/2017   Hyperlipidemia    Hypothyroidism    Reactive depression 03/20/2017   Thyroid cancer [facility])    PMH of; on supressive therapy    Patient Active Problem List   Diagnosis Date Noted   Syncope 07/18/2019   Diabetes mellitus without complication ([place]) 40/98/1191   GERD (gastroesophageal reflux disease) 07/17/2019   Loose stools 08/05/2018   Ankle swelling, right 08/05/2018   Urinary incontinence 08/05/2018   Greater trochanteric bursitis, right 01/27/2018   Otalgia, right 12/21/2017   Bursitis of hip 11/17/2017   Bilateral low back pain without sciatica 11/17/2017   Thoracic aortic aneurysm without rupture ([place]) 11/09/2017   Muscular chest pain 11/09/2017   Recurrent falls 11/09/2017   Hypothyroidism 10/13/2017   B12 deficiency 10/13/2017   Change in stool 10/13/2017   Port catheter in place 06/02/2017   Degenerative arthritis of left knee 03/24/2017   Goals of care, counseling/discussion 03/20/2017   Encounter for antineoplastic chemotherapy 03/20/2017   Malignant  mesothelioma of pleura ([place]) 02/19/2017   History of lung biopsy 02/17/2017   Greater trochanteric bursitis of left hip 12/31/2016   Pleural effusion, left 10/27/2016   infrarenal abdominal aortic aneurysm without rupture ([place]) - [place] needed 2020 10/22/2016   Left lumbar radiculopathy 10/12/2016   Bursitis of left hip 09/30/2016   Left foot drop 09/30/2016   Mild cognitive impairment 07/03/2016   Depression 07/03/2016   Urinary frequency 09/26/2014   Macular degeneration 06/22/2011   Prediabetes 05/20/2009   NONSPECIFIC ABNORMAL ELECTROCARDIOGRAM 05/20/2009   THYROID CANCER, HX OF 05/20/2009   History of colonic polyps 05/20/2009   Hyperlipidemia 11/21/2007    Past Surgical History:  Procedure Laterality Date   CATARACT EXTRACTION, BILATERAL  2013 & 2014   Dr Celene Squibb   colon polyps  2002 & 2005   negative 2008; Dr Earlean Shawl   COLONOSCOPY W/ POLYPECTOMY     LUNG BIOPSY Left 02/15/2017   Procedure: LUNG BIOPSY;  Surgeon: Melrose Nakayama, MD;  Location: [facility];  Service: Thoracic;  Laterality: Left;   PLEURAL EFFUSION DRAINAGE Left 02/15/2017   Procedure: DRAINAGE OF PLEURAL EFFUSION;  Surgeon: Melrose Nakayama, MD;  Location: [facility];  Service: Thoracic;  Laterality: Left;   PORTACATH PLACEMENT N/A 03/25/2017   Procedure: INSERTION PORT-A-CATH, right internal jugular;  Surgeon: Melrose Nakayama, MD;  Location: [place];  Service: Thoracic;  Laterality: N/A;   THYROIDECTOMY  2001   S/P RAI   VIDEO ASSISTED THORACOSCOPY Left 02/15/2017   Procedure: VIDEO ASSISTED THORACOSCOPY;  Surgeon: Melrose Nakayama, MD;  Location: [facility] OR;  Service: Thoracic;  Laterality: Left;        Home Medications    Prior to Admission medications   Medication Sig Start Date End Date Taking? Authorizing Provider  aspirin 81 MG tablet Take 81 mg by mouth daily.   Yes [provider]  bifidobacterium infantis (ALIGN) capsule Take 1 capsule by mouth daily.    Yes  [provider]  citalopram (CELEXA) 10 MG tablet Take 1 tablet (10 mg total) by mouth daily. 08/15/19  Yes Jaffe, Adam R, DO  dexamethasone (DECADRON) 4 MG tablet 1 tablet p.o. twice daily the day before, day of and day after chemotherapy every 3 weeks. 05/29/19  Yes Curt Bears, MD  donepezil (ARICEPT) 10 MG tablet TAKE 1 TABLET BY MOUTH AT BEDTIME 07/12/19  Yes Pieter Partridge, DO  folic acid (FOLVITE) 1 MG tablet Take 1 tablet by mouth once daily 10/02/19  Yes Curt Bears, MD  gabapentin (NEURONTIN) 300 MG capsule TAKE 1 CAPSULE BY MOUTH AT NIGHT 07/18/19  Yes Burns, Claudina Lick, MD  levothyroxine (SYNTHROID) 150 MCG tablet Take 1 tablet by mouth once daily 08/28/19  Yes Burns, Claudina Lick, MD  lidocaine-prilocaine (EMLA) cream Apply 1 application topically as needed. 05/25/19  Yes Curt Bears, MD  meloxicam [facility]) 7.5 MG tablet Take 1 tablet by mouth once daily 06/20/19  Yes Hulan Saas M, DO  omeprazole (PRILOSEC) 20 MG capsule Take 1 capsule by mouth once daily 08/28/19  Yes Burns, Claudina Lick, MD  rosuvastatin (CRESTOR) 20 MG tablet Take 1 tablet by mouth once daily 10/02/19  Yes Burns, Claudina Lick, MD  blood glucose meter kit and supplies KIT Use to check blood sugars daily. Dx Code-E11.9 07/19/19   [name], MD  glucose blood (COOL BLOOD GLUCOSE TEST STRIPS) test strip Use to check blood sugars daily. Dx code: E11.9 07/21/19   [name], MD  Lancets 28G MISC Use to check blood sugars daily. Dx E11.9 07/21/19   [name], MD  levETIRAcetam (KEPPRA) 500 MG tablet Take 1 tablet (500 mg total) by mouth 2 (two) times daily. 10/11/19   Milton Ferguson, MD  prochlorperazine (COMPAZINE) 10 MG tablet Take 1 tablet (10 mg total) by mouth every 6 (six) hours as needed for nausea or vomiting. Patient not taking: Reported on 10/11/2019 05/25/19   Curt Bears, MD  folic acid (FOLVITE) 1 MG tablet Take 1 tablet (1 mg total) by mouth daily. 05/25/19   Curt Bears, MD    Family  History Family History  Problem Relation Age of Onset   Alzheimer's disease Mother    Hypertension Mother    Heart disease Father        CAD and angioplasty in 78s   Cancer Father        Bladder   Other Brother        valvular heart disease   Diabetes Neg Hx    Stroke Neg Hx     Social History Social History   Tobacco Use   Smoking status: Former Smoker    Packs/day: 1.00    Years: 5.00    Pack years: 5.00    Types: Cigarettes    Quit date: 12/07/1958    Years since quitting: 60.8   Smokeless tobacco: Never Used   Tobacco comment: smoked 1956-1960, up 1 ppd  Substance Use Topics   Alcohol use: Yes    Alcohol/week: 14.0 standard drinks    Types: 14 Glasses of wine per  week   Drug use: No     Allergies   Ativan [lorazepam], Fentanyl, and Tramadol   Review of Systems Review of Systems  Unable to perform ROS: Dementia  Neurological: Positive for seizures.     Physical Exam Updated Vital Signs BP (!) 142/96    Pulse 85    Resp 17    SpO2 93%   Physical Exam Vitals signs and nursing note reviewed.  Constitutional:      Appearance: He is well-developed.  HENT:     Head: Normocephalic.     Nose: Nose normal.  Eyes:     General: No scleral icterus.    Conjunctiva/sclera: Conjunctivae normal.  Neck:     Musculoskeletal: Neck supple.     Thyroid: No thyromegaly.  Cardiovascular:     Rate and Rhythm: Normal rate and regular rhythm.     Heart sounds: No murmur. No friction rub. No gallop.   Pulmonary:     Breath sounds: No stridor. No wheezing or rales.  Chest:     Chest wall: No tenderness.  Abdominal:     General: There is no distension.     Tenderness: There is no abdominal tenderness. There is no rebound.  Musculoskeletal: Normal range of motion.  Lymphadenopathy:     Cervical: No cervical adenopathy.  Skin:    Findings: No erythema or rash.  Neurological:     Motor: No abnormal muscle tone.     Coordination: Coordination normal.      Comments: Oriented to person only but alert      ED Treatments / Results  Labs (all labs ordered are listed, but only abnormal results are displayed) Labs Reviewed  CBC - Abnormal; Notable for the following components:      Result Value   WBC 3.1 (*)    RBC 4.15 (*)    MCV 111.3 (*)    MCH 36.6 (*)    Platelets 134 (*)    All other components within normal limits  BASIC METABOLIC PANEL - Abnormal; Notable for the following components:   CO2 17 (*)    Glucose, Bld 229 (*)    All other components within normal limits  CBG MONITORING, ED - Abnormal; Notable for the following components:   Glucose-Capillary 222 (*)    All other components within normal limits    EKG None  Radiology Ct Head Wo Contrast  Result Date: 10/11/2019 CLINICAL DATA:  Altered level of consciousness. Seizure. History of malignant pleural mesothelioma and thyroid cancer. EXAM: CT HEAD WITHOUT CONTRAST TECHNIQUE: Contiguous axial images were obtained from the base of the skull through the vertex without intravenous contrast. COMPARISON:  Head MRI 09/18/2019 FINDINGS: Brain: There is no evidence of acute infarct, intracranial hemorrhage, mass, midline shift, or extra-axial fluid collection. There is moderate cerebral atrophy. Patchy to confluent hypodensities in the cerebral white matter bilaterally are nonspecific but compatible with moderate chronic small vessel ischemic disease. Vascular: Calcified atherosclerosis at the skull base. No hyperdense vessel. Skull: No fracture or focal osseous lesion. Sinuses/Orbits: Visualized paranasal sinuses and mastoid air cells are clear. Bilateral cataract extraction is noted. Other: None. IMPRESSION: 1. No evidence of acute intracranial abnormality. 2. Moderate chronic small vessel ischemic disease and cerebral atrophy. Electronically Signed   By: Logan Bores M.D.   On: 10/11/2019 21:49    Procedures Procedures (including critical care time)  Medications Ordered in  ED Medications  levETIRAcetam (KEPPRA) IVPB 1000 mg/100 mL premix (0 mg Intravenous Stopped 10/11/19 2132)  Initial Impression / Assessment and Plan / ED Course  I have reviewed the triage vital signs and the nursing notes.  Pertinent labs & imaging results that were available during my care of the patient were reviewed by me and considered in my medical decision making (see chart for details).    CRITICAL CARE Performed by: Milton Ferguson Total critical care time: 35 minutes Critical care time was exclusive of separately billable procedures and treating other patients. Critical care was necessary to treat or prevent imminent or life-threatening deterioration. Critical care was time spent personally by me on the following activities: development of treatment plan with patient and/or surrogate as well as nursing, discussions with consultants, evaluation of patient's response to treatment, examination of patient, obtaining history from patient or surrogate, ordering and performing treatments and interventions, ordering and review of laboratory studies, ordering and review of radiographic studies, pulse oximetry and re-evaluation of patient's condition.     Labs and CT scan unremarkable.  Patient will be placed on Keppra.  His daughter will notify Dr. Julien Nordmann about the seizure tomorrow Final Clinical Impressions(s) / ED Diagnoses   Final diagnoses:  Seizure [facility])    ED Discharge Orders         Ordered    levETIRAcetam (KEPPRA) 500 MG tablet  2 times daily     10/11/19 2225           Milton Ferguson, MD 10/11/19 2227    Milton Ferguson, MD 10/22/19 1201

## 2019-10-11 NOTE — Progress Notes (Signed)
[facility] OFFICE PROGRESS NOTE  [name], MD [place] [address]  DIAGNOSIS: Stage II malignant pleural mesothelioma, epithelioid type involving the left hemithorax diagnosed in March 2018.  PRIOR THERAPY:  1) Status post left VATS with biopsy and wedge resection of the left lower lobe as well as parietal pleurectomy under the care of Dr. Roxan Hockey on 02/15/2017. 2) Palliative systemic chemotherapy with carboplatin for AUC of 5, Alimta 500 MG/M2 and Avastin 15 MG/KG every 3 weeks. Status post 6 cycles. The patient has been on observation for almost 2 years.  CURRENT THERAPY: Resuming systemic chemotherapy with carboplatin for AUC of 5, Alimta 500 mg/M2 and Avastin 15 mg/KG every 3 weeks. He received a reduced dose of carboplatin for an AUC of 4 and Alimta 400 mg/m2 for cycle # 5. First dose May 31, 2019. Status post 5cycles.  INTERVAL HISTORY: [place] 85 y.o. male returns to the clinic for a follow up visit. The patient is feeling well without any concerning complaints. The patient lives with his daughter since he has dementia. The patient has mesothelioma and is tolerating his chemotherapy well. He denies any fever, chills, night sweats, or weight loss.  He occasionally experiences pleuritic chest pain with deep breathing.  Otherwise he denies any shortness of breath, cough, or hemoptysis.  He denies any nausea, vomiting, or constipation. He occasionally experiences diarrhea without any certain pattern or triggers. They use imodium at home for when he experiences diarrhea. He denies any headache or visual changes. The patient and his daughter get assistance once a month through [facility] for extra nursing assistance. He is here today for evaluation before starting cycle #6 of his treatment.   MEDICAL HISTORY: Past Medical History:  Diagnosis Date  . Cataract   . Depression    situational  . GERD (gastroesophageal reflux disease)   .  Goals of care, counseling/discussion 03/20/2017  . Hyperlipidemia   . Hypothyroidism   . Reactive depression 03/20/2017  . Thyroid cancer ([facility])    PMH of; on supressive therapy    ALLERGIES:  is allergic to ativan [lorazepam]; fentanyl; and tramadol.  MEDICATIONS:  Current Outpatient Medications  Medication Sig Dispense Refill  . aspirin 81 MG tablet Take 81 mg by mouth daily.    . bifidobacterium infantis (ALIGN) capsule Take by mouth.    . blood glucose meter kit and supplies KIT Use to check blood sugars daily. Dx Code-E11.9 1 each 0  . citalopram (CELEXA) 10 MG tablet Take 1 tablet (10 mg total) by mouth daily. 30 tablet 5  . dexamethasone (DECADRON) 4 MG tablet 1 tablet p.o. twice daily the day before, day of and day after chemotherapy every 3 weeks. 40 tablet 1  . donepezil (ARICEPT) 10 MG tablet TAKE 1 TABLET BY MOUTH AT BEDTIME 90 tablet 0  . folic acid (FOLVITE) 1 MG tablet Take 1 tablet by mouth once daily 30 tablet 0  . gabapentin (NEURONTIN) 300 MG capsule TAKE 1 CAPSULE BY MOUTH AT NIGHT 90 capsule 1  . glucose blood (COOL BLOOD GLUCOSE TEST STRIPS) test strip Use to check blood sugars daily. Dx code: E11.9 100 each 1  . Lancets 28G MISC Use to check blood sugars daily. Dx E11.9 100 each 1  . levothyroxine (SYNTHROID) 150 MCG tablet Take 1 tablet by mouth once daily 90 tablet 1  . lidocaine-prilocaine (EMLA) cream Apply 1 application topically as needed. 30 g 0  . meloxicam (MOBIC) 7.5 MG tablet  Take 1 tablet by mouth once daily 90 tablet 0  . omeprazole (PRILOSEC) 20 MG capsule Take 1 capsule by mouth once daily 90 capsule 1  . prochlorperazine (COMPAZINE) 10 MG tablet Take 1 tablet (10 mg total) by mouth every 6 (six) hours as needed for nausea or vomiting. 30 tablet 0  . rosuvastatin (CRESTOR) 20 MG tablet Take 1 tablet by mouth once daily 90 tablet 1   No current facility-administered medications for this visit.     SURGICAL HISTORY:  Past Surgical History:   Procedure Laterality Date  . CATARACT EXTRACTION, BILATERAL  2013 & 2014   Dr Celene Squibb  . colon polyps  2002 & 2005   negative 2008; Dr Earlean Shawl  . COLONOSCOPY W/ POLYPECTOMY    . LUNG BIOPSY Left 02/15/2017   Procedure: LUNG BIOPSY;  Surgeon: Melrose Nakayama, MD;  Location: [facility];  Service: Thoracic;  Laterality: Left;  . PLEURAL EFFUSION DRAINAGE Left 02/15/2017   Procedure: DRAINAGE OF PLEURAL EFFUSION;  Surgeon: Melrose Nakayama, MD;  Location: [facility];  Service: Thoracic;  Laterality: Left;  . PORTACATH PLACEMENT N/A 03/25/2017   Procedure: INSERTION PORT-A-CATH, right internal jugular;  Surgeon: Melrose Nakayama, MD;  Location: [facility];  Service: Thoracic;  Laterality: N/A;  . THYROIDECTOMY  2001   S/P RAI  . VIDEO ASSISTED THORACOSCOPY Left 02/15/2017   Procedure: VIDEO ASSISTED THORACOSCOPY;  Surgeon: Melrose Nakayama, MD;  Location: [facility];  Service: Thoracic;  Laterality: Left;    REVIEW OF SYSTEMS:   Review of Systems  Constitutional: Negative for appetite change, chills, fatigue, fever and unexpected weight change.  HENT: Negative for mouth sores, nosebleeds, sore throat and trouble swallowing.   Eyes: Negative for eye problems and icterus.  Respiratory: Negative for cough, hemoptysis, shortness of breath and wheezing.   Cardiovascular: Positive for occasional pleuritic chest pain. Negative for leg swelling.  Gastrointestinal: Positive for occasional diarrhea. Negative for abdominal pain, constipation, nausea and vomiting.  Genitourinary: Negative for bladder incontinence, difficulty urinating, dysuria, frequency and hematuria.   Musculoskeletal: Negative for back pain, gait problem, neck pain and neck stiffness.  Skin: Negative for itching and rash.  Neurological: Negative for dizziness, extremity weakness, gait problem, headaches, light-headedness and seizures.  Hematological: Negative for adenopathy. Does not bruise/bleed easily.  Psychiatric/Behavioral: Positive  for dementia. Negative for confusion, depression and sleep disturbance. The patient is not nervous/anxious.     PHYSICAL EXAMINATION:  Blood pressure (!) 139/97, pulse 87, temperature 98 F (36.7 C), temperature source Temporal, resp. rate 17, height '6\' 1"'  (1.854 m), weight 201 lb 12.8 oz (91.5 kg), SpO2 99 %.  ECOG PERFORMANCE STATUS: 1 - Symptomatic but completely ambulatory  Physical Exam  Constitutional: Oriented to person, place, and time and well-developed, well-nourished, and in no distress.  HENT:  Head: Normocephalic and atraumatic.  Mouth/Throat: Oropharynx is clear and moist. No oropharyngeal exudate.  Eyes: Conjunctivae are normal. Right eye exhibits no discharge. Left eye exhibits no discharge. No scleral icterus.  Neck: Normal range of motion. Neck supple.  Cardiovascular: Normal rate, regular rhythm, normal heart sounds and intact distal pulses.   Pulmonary/Chest: Effort normal and breath sounds normal. No respiratory distress. No wheezes. No rales.  Abdominal: Soft. Bowel sounds are normal. Exhibits no distension and no mass. There is no tenderness.  Musculoskeletal: Normal range of motion. Exhibits no edema.  Lymphadenopathy:    No cervical adenopathy.  Neurological: Alert and oriented to person, place, and time. Exhibits normal muscle tone. Gait normal.  Coordination normal.  Skin: Skin is warm and dry. No rash noted. Not diaphoretic. No erythema. No pallor.  Psychiatric: Mood pleasant. Patient has dementia.   Vitals reviewed.  LABORATORY DATA: Lab Results  Component Value Date   WBC 2.8 (L) 10/11/2019   HGB 14.4 10/11/2019   HCT 42.3 10/11/2019   MCV 108.7 (H) 10/11/2019   PLT 142 (L) 10/11/2019      Chemistry      Component Value Date/Time   NA 141 10/11/2019 1115   NA 140 11/04/2017 1210   K 4.2 10/11/2019 1115   K 4.1 11/04/2017 1210   CL 105 10/11/2019 1115   CO2 23 10/11/2019 1115   CO2 26 11/04/2017 1210   BUN 16 10/11/2019 1115   BUN 20.1  11/04/2017 1210   CREATININE 0.91 10/11/2019 1115   CREATININE 1.2 11/04/2017 1210      Component Value Date/Time   CALCIUM 9.2 10/11/2019 1115   CALCIUM 9.6 11/04/2017 1210   ALKPHOS 65 10/11/2019 1115   ALKPHOS 62 11/04/2017 1210   AST 36 10/11/2019 1115   AST 37 (H) 11/04/2017 1210   ALT 20 10/11/2019 1115   ALT 22 11/04/2017 1210   BILITOT 0.6 10/11/2019 1115   BILITOT 1.32 (H) 11/04/2017 1210       RADIOGRAPHIC STUDIES:  Mr Jeri Cos ZO Contrast  Result Date: 09/18/2019 CLINICAL DATA:  Acute confusion. Personal history of malignant pleural mesothelioma. Thyroid cancer. EXAM: MRI HEAD WITHOUT AND WITH CONTRAST TECHNIQUE: Multiplanar, multiecho pulse sequences of the brain and surrounding structures were obtained without and with intravenous contrast. CONTRAST:  76m MULTIHANCE GADOBENATE DIMEGLUMINE 529 MG/ML IV SOLN COMPARISON:  MR head without and with contrast 07/19/2017 FINDINGS: Brain: Advanced atrophy and white matter disease is again seen. No acute infarct, hemorrhage, or mass lesion is present. The ventricles are proportionate to the degree of atrophy. No significant extraaxial fluid collection is present. The internal auditory canals are within normal limits. The brainstem and cerebellum are within normal limits. Postcontrast images demonstrate no pathologic enhancement. Vascular: Flow is present in the major intracranial arteries. Skull and upper cervical spine: The craniocervical junction is normal. Upper cervical spine is within normal limits. Marrow signal is unremarkable. Sinuses/Orbits: The paranasal sinuses and mastoid air cells are clear. Bilateral lens replacements are noted. Globes and orbits are otherwise unremarkable. IMPRESSION: 1. Advanced atrophy and white matter disease. 2. No acute focal intracranial abnormality to explain confusion or dementia otherwise. Electronically Signed   By: CSan MorelleM.D.   On: 09/18/2019 14:45     ASSESSMENT/PLAN:  This is  a very pleasant 89year oldCaucasian male diagnosed with malignant pleural mesothelioma involving the left hemithorax. He was diagnosed in March 2018.  He is status postaleft VATS with biopsy and wedge resection of the left lower lobe as well as parietal pleurectomy under the care of Dr. HJuliet RudeMarch 2018.  He completed treatment with systemic chemotherapy with carboplatin, alimta, and Avastin. He is status post 6 cycles. He has been on observation for almost 2 years  The patientshowedevidence of disease progression in June 2020. He recently restarted systemic chemotherapy with carboplatin for an AUCof 5, Alimta 5024mm, and Avastin 15 mg/kg IV every 3 weeks. His dose was reduced to Carboplatin for an AUC of 4 and Alimta 400 mg/m2 for cycle #5. He is status post5cycles. He has been tolerating treatment fairly well without any concerning adverse side effectsexcept for occasional neutropenia requiring granix injections.   Labs were reviewed.  We  recommend that the patient proceed with cycle #6 today as scheduled. We will continue to monitor his labs closely every week. We will plan on ordering a restaging CT scan to be performed before the next cycle of his treatment.  We will see the patient back for a follow up visit in 3 weeks for evaluation and to review his scan results before starting cycle #7.   We will recheck his thyroid at his daughters request because the patient has not been taking his medications as prescribed.   The patient was advised to call immediately if he has any concerning symptoms in the interval. The patient voices understanding of current disease status and treatment options and is in agreement with the current care plan. All questions were answered. The patient knows to call the clinic with any problems, questions or concerns. We can certainly see the patient much sooner if necessary  Orders Placed This Encounter  Procedures  . CT Chest W  Contrast    Standing Status:   Future    Standing Expiration Date:   10/10/2020    Order Specific Question:   ** REASON FOR EXAM (FREE TEXT)    Answer:   Restaging Mesothelioma    Order Specific Question:   If indicated for the ordered procedure, I authorize the administration of contrast media per Radiology protocol    Answer:   Yes    Order Specific Question:   Preferred imaging location?    Answer:   [facility]    Order Specific Question:   Radiology Contrast Protocol - do NOT remove file path    Answer:   \\[email or web address]  . CT Abdomen Pelvis W Contrast    Standing Status:   Future    Standing Expiration Date:   10/10/2020    Order Specific Question:   ** REASON FOR EXAM (FREE TEXT)    Answer:   Restaging mesothelioma    Order Specific Question:   If indicated for the ordered procedure, I authorize the administration of contrast media per Radiology protocol    Answer:   Yes    Order Specific Question:   Preferred imaging location?    Answer:   [facility]    Order Specific Question:   Is Oral Contrast requested for this exam?    Answer:   Yes, Per Radiology protocol    Order Specific Question:   Radiology Contrast Protocol - do NOT remove file path    Answer:   \\[email or web address]  . TSH    Standing Status:   Future    Standing Expiration Date:   10/10/2020     Kris No L Aneli Zara, PA-C 10/11/19

## 2019-10-11 NOTE — ED Notes (Addendum)
RN talked to daughter on phone. Daughter would like to be called with updates. [facility] [phone]

## 2019-10-11 NOTE — Discharge Instructions (Addendum)
Contact Dr. Earlie Server and let him know about the seizure

## 2019-10-12 ENCOUNTER — Telehealth: Payer: Self-pay | Admitting: Internal Medicine

## 2019-10-12 ENCOUNTER — Telehealth: Payer: Self-pay | Admitting: Neurology

## 2019-10-12 ENCOUNTER — Telehealth: Payer: Self-pay | Admitting: *Deleted

## 2019-10-12 NOTE — Telephone Encounter (Signed)
Patient's daughter called regarding Dean Pruitt and him having a Seizure yesterday. She is wanting to know what to do at this point? Please call [phone]. Thank you

## 2019-10-12 NOTE — Telephone Encounter (Signed)
-----   Message from Curt Bears, MD sent at 10/12/2019  1:22 PM EST ----- Regarding: RE: ED 11/4 seizure He needs to go back to the hospital.  Thank you. ----- Message ----- From: Lucile Crater, RN Sent: 10/12/2019   1:07 PM EST To: Curt Bears, MD, # Subject: ED 11/4 seizure                                Dr. Thad Ranger,  Daughter Cecille Rubin called pt had treatment yesterday   D1/C6  Noah Charon, Alimta and Carbo-dose reduced,   had seizure at 7pm, went to ED, CT head, -rx for Kepra .   Pt was then d/c home, couldn't get out of car,  when he did  get out of car he went to garage fell over,  could not get up or move- lori called fire dept they helped pt into house.   He has been incontinent of bowels/bladder, confused ( hx of dementia also).  Pt is up and walking around, "urine running down legs and pooping if floor" per Cecille Rubin.  Please advise [facility]

## 2019-10-12 NOTE — Telephone Encounter (Signed)
Returned call to Cecille Rubin, per MD pt will need to go back to the hospital.  Cecille Rubin is currently waiting on [facility] a service that provides a NP/PA to come out to house to assess pt.  Again advised MD has said for pt to return to hospital. Cecille Rubin again verbalized she will wait for the [facility] service provider to conerm out and see pt for evaluation. Message given to MD

## 2019-10-12 NOTE — Telephone Encounter (Signed)
Scheduled per los. Called and left msg. Mailed printout  °

## 2019-10-13 ENCOUNTER — Telehealth: Payer: Self-pay | Admitting: Neurology

## 2019-10-13 NOTE — Telephone Encounter (Signed)
Pt needs to be seen in office by provider this is new diagnosis for him Dr. Tomi Likens has not seen him for Seizure. Ok to [place] new patient slot in person visit.

## 2019-10-13 NOTE — Telephone Encounter (Signed)
Spoke with patient daughter she states that E.D. provider place patient on Keppra she states that he is better without it and would like to know if he should continue with this. Pt has follow up in 2 weeks.

## 2019-10-13 NOTE — Telephone Encounter (Signed)
Called Cecille Rubin no answer left message to call office back to discuss

## 2019-10-13 NOTE — Telephone Encounter (Signed)
Called patient daughter back again no answer left message to call office back for appt. Pt needs to be seen in office by provider.  Ok to use URGENT/ WORK IN SLOT at 12:50 pm

## 2019-10-13 NOTE — Telephone Encounter (Signed)
Daughter had some questions regarding the Keppra Medication that the hospital told him take. She was wanting to know if she could talk to the nurse about instructions for it.  Thanks!

## 2019-10-15 ENCOUNTER — Encounter (HOSPITAL_COMMUNITY): Payer: Self-pay

## 2019-10-15 ENCOUNTER — Telehealth: Payer: Self-pay | Admitting: Internal Medicine

## 2019-10-15 ENCOUNTER — Emergency Department (HOSPITAL_COMMUNITY)
Admission: EM | Admit: 2019-10-15 | Discharge: 2019-10-15 | Disposition: A | Discharge status: Home / Self Care | Payer: PPO | Attending: Emergency Medicine | Admitting: Emergency Medicine

## 2019-10-15 ENCOUNTER — Other Ambulatory Visit: Payer: Self-pay

## 2019-10-15 ENCOUNTER — Emergency Department (HOSPITAL_COMMUNITY): Payer: PPO

## 2019-10-15 DIAGNOSIS — S42201A Unspecified fracture of upper end of right humerus, initial encounter for closed fracture: Secondary | ICD-10-CM | POA: Insufficient documentation

## 2019-10-15 DIAGNOSIS — R000 Tachycardia, unspecified: Secondary | ICD-10-CM | POA: Diagnosis not present

## 2019-10-15 DIAGNOSIS — Z87891 Personal history of nicotine dependence: Secondary | ICD-10-CM | POA: Diagnosis not present

## 2019-10-15 DIAGNOSIS — Y998 Other external cause status: Secondary | ICD-10-CM | POA: Diagnosis not present

## 2019-10-15 DIAGNOSIS — S42291A Other displaced fracture of upper end of right humerus, initial encounter for closed fracture: Secondary | ICD-10-CM | POA: Diagnosis not present

## 2019-10-15 DIAGNOSIS — R52 Pain, unspecified: Secondary | ICD-10-CM | POA: Diagnosis not present

## 2019-10-15 DIAGNOSIS — Y9389 Activity, other specified: Secondary | ICD-10-CM | POA: Insufficient documentation

## 2019-10-15 DIAGNOSIS — E119 Type 2 diabetes mellitus without complications: Secondary | ICD-10-CM | POA: Insufficient documentation

## 2019-10-15 DIAGNOSIS — S299XXA Unspecified injury of thorax, initial encounter: Secondary | ICD-10-CM | POA: Diagnosis not present

## 2019-10-15 DIAGNOSIS — E039 Hypothyroidism, unspecified: Secondary | ICD-10-CM | POA: Diagnosis not present

## 2019-10-15 DIAGNOSIS — M25519 Pain in unspecified shoulder: Secondary | ICD-10-CM | POA: Diagnosis not present

## 2019-10-15 DIAGNOSIS — Z79899 Other long term (current) drug therapy: Secondary | ICD-10-CM | POA: Diagnosis not present

## 2019-10-15 DIAGNOSIS — Y92012 Bathroom of single-family (private) house as the place of occurrence of the external cause: Secondary | ICD-10-CM | POA: Insufficient documentation

## 2019-10-15 DIAGNOSIS — S3993XA Unspecified injury of pelvis, initial encounter: Secondary | ICD-10-CM | POA: Diagnosis not present

## 2019-10-15 DIAGNOSIS — Z9221 Personal history of antineoplastic chemotherapy: Secondary | ICD-10-CM | POA: Insufficient documentation

## 2019-10-15 DIAGNOSIS — Z85850 Personal history of malignant neoplasm of thyroid: Secondary | ICD-10-CM | POA: Diagnosis not present

## 2019-10-15 DIAGNOSIS — S42351A Displaced comminuted fracture of shaft of humerus, right arm, initial encounter for closed fracture: Secondary | ICD-10-CM | POA: Diagnosis not present

## 2019-10-15 DIAGNOSIS — Z7982 Long term (current) use of aspirin: Secondary | ICD-10-CM | POA: Diagnosis not present

## 2019-10-15 DIAGNOSIS — S4991XA Unspecified injury of right shoulder and upper arm, initial encounter: Secondary | ICD-10-CM | POA: Diagnosis present

## 2019-10-15 DIAGNOSIS — W19XXXA Unspecified fall, initial encounter: Secondary | ICD-10-CM | POA: Insufficient documentation

## 2019-10-15 DIAGNOSIS — S199XXA Unspecified injury of neck, initial encounter: Secondary | ICD-10-CM | POA: Diagnosis not present

## 2019-10-15 DIAGNOSIS — R0902 Hypoxemia: Secondary | ICD-10-CM | POA: Diagnosis not present

## 2019-10-15 DIAGNOSIS — S0990XA Unspecified injury of head, initial encounter: Secondary | ICD-10-CM | POA: Diagnosis not present

## 2019-10-15 LAB — CBC WITH DIFFERENTIAL/PLATELET
Abs Immature Granulocytes: 0.01 10*3/uL (ref 0.00–0.07)
Basophils Absolute: 0 10*3/uL (ref 0.0–0.1)
Basophils Relative: 0 %
Eosinophils Absolute: 0 10*3/uL (ref 0.0–0.5)
Eosinophils Relative: 1 %
HCT: 37.8 % — ABNORMAL LOW (ref 39.0–52.0)
Hemoglobin: 12.8 g/dL — ABNORMAL LOW (ref 13.0–17.0)
Immature Granulocytes: 0 %
Lymphocytes Relative: 11 %
Lymphs Abs: 0.5 10*3/uL — ABNORMAL LOW (ref 0.7–4.0)
MCH: 36.9 pg — ABNORMAL HIGH (ref 26.0–34.0)
MCHC: 33.9 g/dL (ref 30.0–36.0)
MCV: 108.9 fL — ABNORMAL HIGH (ref 80.0–100.0)
Monocytes Absolute: 0.2 10*3/uL (ref 0.1–1.0)
Monocytes Relative: 4 %
Neutro Abs: 3.6 10*3/uL (ref 1.7–7.7)
Neutrophils Relative %: 84 %
Platelets: 120 10*3/uL — ABNORMAL LOW (ref 150–400)
RBC: 3.47 MIL/uL — ABNORMAL LOW (ref 4.22–5.81)
RDW: 13.2 % (ref 11.5–15.5)
WBC: 4.3 10*3/uL (ref 4.0–10.5)
nRBC: 0 % (ref 0.0–0.2)

## 2019-10-15 LAB — URINALYSIS, ROUTINE W REFLEX MICROSCOPIC
Bacteria, UA: NONE SEEN
Bilirubin Urine: NEGATIVE
Glucose, UA: NEGATIVE mg/dL
Ketones, ur: 20 mg/dL — AB
Leukocytes,Ua: NEGATIVE
Nitrite: NEGATIVE
Protein, ur: 100 mg/dL — AB
Specific Gravity, Urine: 1.021 (ref 1.005–1.030)
pH: 6 (ref 5.0–8.0)

## 2019-10-15 LAB — COMPREHENSIVE METABOLIC PANEL
ALT: 32 U/L (ref 0–44)
AST: 60 U/L — ABNORMAL HIGH (ref 15–41)
Albumin: 3.8 g/dL (ref 3.5–5.0)
Alkaline Phosphatase: 56 U/L (ref 38–126)
Anion gap: 11 (ref 5–15)
BUN: 29 mg/dL — ABNORMAL HIGH (ref 8–23)
CO2: 24 mmol/L (ref 22–32)
Calcium: 8.5 mg/dL — ABNORMAL LOW (ref 8.9–10.3)
Chloride: 102 mmol/L (ref 98–111)
Creatinine, Ser: 0.92 mg/dL (ref 0.61–1.24)
GFR calc Af Amer: >60 mL/min (ref >60)
GFR calc non Af Amer: >60 mL/min (ref >60)
Glucose, Bld: 138 mg/dL — ABNORMAL HIGH (ref 70–99)
Potassium: 3.7 mmol/L (ref 3.5–5.1)
Sodium: 137 mmol/L (ref 135–145)
Total Bilirubin: 1.7 mg/dL — ABNORMAL HIGH (ref 0.3–1.2)
Total Protein: 6.3 g/dL — ABNORMAL LOW (ref 6.5–8.1)

## 2019-10-15 MED ORDER — MORPHINE SULFATE (PF) 4 MG/ML IV SOLN
4.0000 mg | Freq: Once | INTRAVENOUS | Status: AC
  Administered 2019-10-15: 4 mg via INTRAVENOUS
  Filled 2019-10-15: qty 1

## 2019-10-15 MED ORDER — OXYCODONE HCL 5 MG PO TABS: 5.0000 mg | ORAL_TABLET | Freq: Four times a day (QID) | ORAL | 0 refills | Status: AC | PRN

## 2019-10-15 MED ORDER — ONDANSETRON HCL 4 MG/2ML IJ SOLN
4.0000 mg | Freq: Once | INTRAMUSCULAR | Status: AC
  Administered 2019-10-15: 4 mg via INTRAVENOUS
  Filled 2019-10-15: qty 2

## 2019-10-15 NOTE — Telephone Encounter (Signed)
Copied from [place] [id number]. Topic: General - Other >> Oct 13, 2019  3:27 PM Berneta Levins wrote: Reason for CRM:   Pt's daughter calling to report that pt had a seizure after chemo treatment on Wednedsay.  States that the anti-seizure medication has caused pt be be crazy and there is an appointment set with Dr. Tomi Likens.   Cecille Rubin just wants to have this on record.

## 2019-10-15 NOTE — Telephone Encounter (Signed)
noted 

## 2019-10-15 NOTE — ED Provider Notes (Addendum)
Dean Pruitt   CSN: [id number] Arrival date & time: 10/15/19  1405     History   Chief Complaint Chief Complaint  Patient presents with   Fall   Arm Injury    HPI Dean Pruitt is a 85 y.o. male hx of HTN, HL, here with fall. Patient was seen about a week ago for seizure. He was put on keppra at that time and he has been agitated. He told me that his coffee maker fell on his right shoulder about a week ago. However, EMS reported that he fell onto the right shoulder today. Unclear if he had head injury today. He is demented and unable to give history. Complains of R shoulder pain. Patient is not on blood thinners.      The history is provided by the patient and the EMS personnel.  Level V caveat- dementia   Past Medical History:  Diagnosis Date   Cataract    Depression    situational   GERD (gastroesophageal reflux disease)    Goals of care, counseling/discussion 03/20/2017   Hyperlipidemia    Hypothyroidism    Reactive depression 03/20/2017   Thyroid cancer [facility])    PMH of; on supressive therapy    Patient Active Problem List   Diagnosis Date Noted   Syncope 07/18/2019   Diabetes mellitus without complication ([place]) 52/77/8242   GERD (gastroesophageal reflux disease) 07/17/2019   Loose stools 08/05/2018   Ankle swelling, right 08/05/2018   Urinary incontinence 08/05/2018   Greater trochanteric bursitis, right 01/27/2018   Otalgia, right 12/21/2017   Bursitis of hip 11/17/2017   Bilateral low back pain without sciatica 11/17/2017   Thoracic aortic aneurysm without rupture ([place]) 11/09/2017   Muscular chest pain 11/09/2017   Recurrent falls 11/09/2017   Hypothyroidism 10/13/2017   B12 deficiency 10/13/2017   Change in stool 10/13/2017   Port catheter in place 06/02/2017   Degenerative arthritis of left knee 03/24/2017   Goals of care, counseling/discussion 03/20/2017   Encounter  for antineoplastic chemotherapy 03/20/2017   Malignant mesothelioma of pleura ([place]) 02/19/2017   History of lung biopsy 02/17/2017   Greater trochanteric bursitis of left hip 12/31/2016   Pleural effusion, left 10/27/2016   infrarenal abdominal aortic aneurysm without rupture ([place]) - [place] needed 2020 10/22/2016   Left lumbar radiculopathy 10/12/2016   Bursitis of left hip 09/30/2016   Left foot drop 09/30/2016   Mild cognitive impairment 07/03/2016   Depression 07/03/2016   Urinary frequency 09/26/2014   Macular degeneration 06/22/2011   Prediabetes 05/20/2009   NONSPECIFIC ABNORMAL ELECTROCARDIOGRAM 05/20/2009   THYROID CANCER, HX OF 05/20/2009   History of colonic polyps 05/20/2009   Hyperlipidemia 11/21/2007    Past Surgical History:  Procedure Laterality Date   CATARACT EXTRACTION, BILATERAL  2013 & 2014   Dr Celene Squibb   colon polyps  2002 & 2005   negative 2008; Dr Earlean Shawl   COLONOSCOPY W/ POLYPECTOMY     LUNG BIOPSY Left 02/15/2017   Procedure: LUNG BIOPSY;  Surgeon: Melrose Nakayama, MD;  Location: [place];  Service: Thoracic;  Laterality: Left;   PLEURAL EFFUSION DRAINAGE Left 02/15/2017   Procedure: DRAINAGE OF PLEURAL EFFUSION;  Surgeon: Melrose Nakayama, MD;  Location: [place];  Service: Thoracic;  Laterality: Left;   PORTACATH PLACEMENT N/A 03/25/2017   Procedure: INSERTION PORT-A-CATH, right internal jugular;  Surgeon: Melrose Nakayama, MD;  Location: [place];  Service: Thoracic;  Laterality: N/A;   THYROIDECTOMY  2001  S/P RAI   VIDEO ASSISTED THORACOSCOPY Left 02/15/2017   Procedure: VIDEO ASSISTED THORACOSCOPY;  Surgeon: Melrose Nakayama, MD;  Location: [place];  Service: Thoracic;  Laterality: Left;        Home Medications    Prior to Admission medications   Medication Sig Start Date End Date Taking? Authorizing Provider  aspirin 81 MG tablet Take 81 mg by mouth daily.    [provider]  bifidobacterium infantis (ALIGN)  capsule Take 1 capsule by mouth daily.     [provider]  blood glucose meter kit and supplies KIT Use to check blood sugars daily. Dx Code-E11.9 07/19/19   [name], MD  citalopram (CELEXA) 10 MG tablet Take 1 tablet (10 mg total) by mouth daily. 08/15/19   Tomi Likens, Adam R, DO  dexamethasone (DECADRON) 4 MG tablet 1 tablet p.o. twice daily the day before, day of and day after chemotherapy every 3 weeks. 05/29/19   Curt Bears, MD  donepezil (ARICEPT) 10 MG tablet TAKE 1 TABLET BY MOUTH AT BEDTIME 07/12/19   Pieter Partridge, DO  folic acid (FOLVITE) 1 MG tablet Take 1 tablet by mouth once daily 10/02/19   Curt Bears, MD  gabapentin (NEURONTIN) 300 MG capsule TAKE 1 CAPSULE BY MOUTH AT NIGHT 07/18/19   Burns, Claudina Lick, MD  glucose blood (COOL BLOOD GLUCOSE TEST STRIPS) test strip Use to check blood sugars daily. Dx code: E11.9 07/21/19   [name], MD  Lancets 28G MISC Use to check blood sugars daily. Dx E11.9 07/21/19   [name], MD  levETIRAcetam (KEPPRA) 500 MG tablet Take 1 tablet (500 mg total) by mouth 2 (two) times daily. 10/11/19   Milton Ferguson, MD  levothyroxine (SYNTHROID) 150 MCG tablet Take 1 tablet by mouth once daily 08/28/19   [name], MD  lidocaine-prilocaine (EMLA) cream Apply 1 application topically as needed. 05/25/19   Curt Bears, MD  meloxicam [facility]) 7.5 MG tablet Take 1 tablet by mouth once daily 06/20/19   Lyndal Pulley, DO  omeprazole (PRILOSEC) 20 MG capsule Take 1 capsule by mouth once daily 08/28/19   [name], MD  prochlorperazine (COMPAZINE) 10 MG tablet Take 1 tablet (10 mg total) by mouth every 6 (six) hours as needed for nausea or vomiting. Patient not taking: Reported on 10/11/2019 05/25/19   Curt Bears, MD  rosuvastatin (CRESTOR) 20 MG tablet Take 1 tablet by mouth once daily 10/02/19   [name], MD  folic acid (FOLVITE) 1 MG tablet Take 1 tablet (1 mg total) by mouth daily. 05/25/19   Curt Bears, MD     Family History Family History  Problem Relation Age of Onset   Alzheimer's disease Mother    Hypertension Mother    Heart disease Father        CAD and angioplasty in 4s   Cancer Father        Bladder   Other Brother        valvular heart disease   Diabetes Neg Hx    Stroke Neg Hx     Social History Social History   Tobacco Use   Smoking status: Former Smoker    Packs/day: 1.00    Years: 5.00    Pack years: 5.00    Types: Cigarettes    Quit date: 12/07/1958    Years since quitting: 60.8   Smokeless tobacco: Never Used   Tobacco comment: smoked 1956-1960, up 1 ppd  Substance Use  Topics   Alcohol use: Yes    Alcohol/week: 14.0 standard drinks    Types: 14 Glasses of wine per week   Drug use: No     Allergies   Ativan [lorazepam], Fentanyl, and Tramadol   Review of Systems Review of Systems  Musculoskeletal:       R shoulder pain   All other systems reviewed and are negative.    Physical Exam Updated Vital Signs BP (!) 142/88    Pulse 78    Temp 98.3 F (36.8 C) (Oral)    Resp 18    Ht '5\' 10"'  (1.778 m)    Wt 91.5 kg    SpO2 96%    BMI 28.94 kg/m   Physical Exam Vitals signs and nursing Pruitt reviewed.  Constitutional:      Comments: Uncomfortable, demented   HENT:     Head: Normocephalic.     Nose: Nose normal.     Mouth/Throat:     Mouth: Mucous membranes are moist.  Eyes:     Extraocular Movements: Extraocular movements intact.     Pupils: Pupils are equal, round, and reactive to light.  Neck:     Musculoskeletal: Normal range of motion and neck supple.  Cardiovascular:     Rate and Rhythm: Normal rate and regular rhythm.     Pulses: Normal pulses.     Heart sounds: Normal heart sounds.  Pulmonary:     Effort: Pulmonary effort is normal.     Breath sounds: Normal breath sounds.  Abdominal:     General: Abdomen is flat.     Palpations: Abdomen is soft.  Musculoskeletal:     Comments: Obvious deformity R proximal humerus and  R shoulder. No obvious shoulder dislocation. No spinal tenderness, nl ROM bilateral hips   Skin:    General: Skin is warm.     Capillary Refill: Capillary refill takes less than 2 seconds.  Neurological:     General: No focal deficit present.     Comments: Demented, moving all extremities   Psychiatric:        Mood and Affect: Mood normal.      ED Treatments / Results  Labs (all labs ordered are listed, but only abnormal results are displayed) Labs Reviewed  COMPREHENSIVE METABOLIC PANEL - Abnormal; Notable for the following components:      Result Value   Glucose, Bld 138 (*)    BUN 29 (*)    Calcium 8.5 (*)    Total Protein 6.3 (*)    AST 60 (*)    Total Bilirubin 1.7 (*)    All other components within normal limits  CBC WITH DIFFERENTIAL/PLATELET - Abnormal; Notable for the following components:   RBC 3.47 (*)    Hemoglobin 12.8 (*)    HCT 37.8 (*)    MCV 108.9 (*)    MCH 36.9 (*)    Platelets 120 (*)    Lymphs Abs 0.5 (*)    All other components within normal limits  URINALYSIS, ROUTINE W REFLEX MICROSCOPIC - Abnormal; Notable for the following components:   Hgb urine dipstick SMALL (*)    Ketones, ur 20 (*)    Protein, ur 100 (*)    All other components within normal limits    EKG None  Radiology Dg Chest 1 View  Result Date: 10/15/2019 CLINICAL DATA:  Status post fall. EXAM: CHEST  1 VIEW COMPARISON:  Chest radiograph 11/01/2017. FINDINGS: Port-A-Cath tip projects over the superior vena cava. Monitoring leads  overlie the patient. Stable cardiac and mediastinal contours. Low lung volumes. Left basilar atelectasis/scarring. No pleural effusion or pneumothorax. IMPRESSION: No acute cardiopulmonary process. Left basilar atelectasis/scarring. Electronically Signed   By: Lovey Newcomer M.D.   On: 10/15/2019 15:43   Dg Pelvis 1-2 Views  Result Date: 10/15/2019 CLINICAL DATA:  Fall, unwitnessed EXAM: PELVIS - 1-2 VIEW COMPARISON:  CT August 01, 2019 FINDINGS: No  convincing features of pelvic fracture or diastasis. Femoral heads are normally located. The femoral necks are largely obscured due to external rotation of the hips though no gross traumatic deformity is appreciated. Radiodensity adjacent the right superior acetabulum compatible with a degenerative os acetabuli. Coarse radiodensities over the sacrum and adjacent the coccyx likely reflect retained contrast media within colonic diverticuli. Several phleboliths noted in the pelvis. No high-grade obstructive bowel gas pattern. IMPRESSION: 1. No convincing features of pelvic fracture or diastasis. 2. No gross abnormality of the proximal femora however external rotation limits evaluation the femoral necks. Consider dedicated hip radiographs if there is clinical concern. 3. Rounded radiodensities over the sacrum and coccyx likely reflect retained contrast media within colonic diverticuli. Electronically Signed   By: Lovena Le M.D.   On: 10/15/2019 15:44   Dg Shoulder Right  Result Date: 10/15/2019 CLINICAL DATA:  Status post fall. EXAM: RIGHT SHOULDER - 2+ VIEW COMPARISON:  None. FINDINGS: There is a displaced comminuted fracture through the proximal aspect of the right humerus. No evidence for associated acute fractures. Visualized right hemithorax is unremarkable. Port-A-Cath projects over the anterior right chest wall. IMPRESSION: Displaced comminuted proximal right humerus fracture. Electronically Signed   By: Lovey Newcomer M.D.   On: 10/15/2019 15:39   Ct Head Wo Contrast  Result Date: 10/15/2019 CLINICAL DATA:  Unwitnessed fall in restroom, obvious right upper extremity deformity EXAM: CT HEAD WITHOUT CONTRAST CT CERVICAL SPINE WITHOUT CONTRAST TECHNIQUE: Multidetector CT imaging of the head and cervical spine was performed following the standard protocol without intravenous contrast. Multiplanar CT image reconstructions of the cervical spine were also generated. COMPARISON:  CT head 10/11/2019, MR head  09/18/2011 FINDINGS: CT HEAD FINDINGS Brain: No evidence of acute infarction, hemorrhage, hydrocephalus, extra-axial collection or mass lesion/mass effect. Symmetric prominence of the ventricles, cisterns and sulci compatible with parenchymal volume loss. Patchy areas of white matter hypoattenuation are most compatible with chronic microvascular angiopathy. Vascular: Atherosclerotic calcification of the carotid siphons. No hyperdense vessel. Skull: No calvarial fracture or suspicious osseous lesion. No scalp swelling or hematoma. Sinuses/Orbits: Paranasal sinuses and mastoid air cells are predominantly clear. Orbital structures are unremarkable aside from prior lens extractions. Other: Right humeral fracture seen on scout view. CT CERVICAL SPINE FINDINGS Alignment: Straightening of the normal cervical lordosis. Mild degenerative anterolisthesis of C3 on C4. Craniocervical atlantoaxial articulations are maintained with mild arthrosis. No traumatic listhesis. No abnormal asymmetric facet widening. Skull base and vertebrae: No acute fracture. No primary bone lesion or focal pathologic process. Soft tissues and spinal canal: No pre or paravertebral fluid or swelling. No visible canal hematoma. Disc levels: Multilevel degenerative changes are present in the imaged portions of the spine. Findings most pronounced at C4-5 with a posterior disc osteophyte complex effacing the ventral thecal sac resulting mild canal stenosis. Additional disc osteophyte complexes C5-6 and C6-7 efface the thecal sac without significant stenosis. Uncinate spurring and facet hypertrophic changes are present throughout the spine with moderate right neural foraminal narrowing C4-C7 in addition to multilevel mild foraminal narrowing elsewhere throughout the cervical spine. Upper chest: No acute abnormality  in the upper chest or imaged lung apices. Right IJ approach Port-A-Cath is noted. Other: Atherosclerotic calcification of the cervical carotids.  Prior thyroidectomy. IMPRESSION: 1. No acute intracranial abnormality. 2. Moderate parenchymal volume loss and chronic microvascular ischemic white matter disease, similar to prior. 3. No acute cervical spine fracture or traumatic listhesis. 4. Multilevel degenerative changes throughout the cervical spine as described above. 5. Right humeral fracture seen on scout view. 6. Prior thyroidectomy. 7. Intracranial and cervical carotid atherosclerosis. Electronically Signed   By: Lovena Le M.D.   On: 10/15/2019 16:15   Ct Cervical Spine Wo Contrast  Result Date: 10/15/2019 CLINICAL DATA:  Unwitnessed fall in restroom, obvious right upper extremity deformity EXAM: CT HEAD WITHOUT CONTRAST CT CERVICAL SPINE WITHOUT CONTRAST TECHNIQUE: Multidetector CT imaging of the head and cervical spine was performed following the standard protocol without intravenous contrast. Multiplanar CT image reconstructions of the cervical spine were also generated. COMPARISON:  CT head 10/11/2019, MR head 09/18/2011 FINDINGS: CT HEAD FINDINGS Brain: No evidence of acute infarction, hemorrhage, hydrocephalus, extra-axial collection or mass lesion/mass effect. Symmetric prominence of the ventricles, cisterns and sulci compatible with parenchymal volume loss. Patchy areas of white matter hypoattenuation are most compatible with chronic microvascular angiopathy. Vascular: Atherosclerotic calcification of the carotid siphons. No hyperdense vessel. Skull: No calvarial fracture or suspicious osseous lesion. No scalp swelling or hematoma. Sinuses/Orbits: Paranasal sinuses and mastoid air cells are predominantly clear. Orbital structures are unremarkable aside from prior lens extractions. Other: Right humeral fracture seen on scout view. CT CERVICAL SPINE FINDINGS Alignment: Straightening of the normal cervical lordosis. Mild degenerative anterolisthesis of C3 on C4. Craniocervical atlantoaxial articulations are maintained with mild arthrosis. No  traumatic listhesis. No abnormal asymmetric facet widening. Skull base and vertebrae: No acute fracture. No primary bone lesion or focal pathologic process. Soft tissues and spinal canal: No pre or paravertebral fluid or swelling. No visible canal hematoma. Disc levels: Multilevel degenerative changes are present in the imaged portions of the spine. Findings most pronounced at C4-5 with a posterior disc osteophyte complex effacing the ventral thecal sac resulting mild canal stenosis. Additional disc osteophyte complexes C5-6 and C6-7 efface the thecal sac without significant stenosis. Uncinate spurring and facet hypertrophic changes are present throughout the spine with moderate right neural foraminal narrowing C4-C7 in addition to multilevel mild foraminal narrowing elsewhere throughout the cervical spine. Upper chest: No acute abnormality in the upper chest or imaged lung apices. Right IJ approach Port-A-Cath is noted. Other: Atherosclerotic calcification of the cervical carotids. Prior thyroidectomy. IMPRESSION: 1. No acute intracranial abnormality. 2. Moderate parenchymal volume loss and chronic microvascular ischemic white matter disease, similar to prior. 3. No acute cervical spine fracture or traumatic listhesis. 4. Multilevel degenerative changes throughout the cervical spine as described above. 5. Right humeral fracture seen on scout view. 6. Prior thyroidectomy. 7. Intracranial and cervical carotid atherosclerosis. Electronically Signed   By: Lovena Le M.D.   On: 10/15/2019 16:15   Dg Humerus Right  Result Date: 10/15/2019 CLINICAL DATA:  Fall, shoulder deformity EXAM: RIGHT HUMERUS - 2+ VIEW COMPARISON:  Shoulder radiograph 10/15/2019 FINDINGS: Comminuted fracture of the proximal right humerus with extension through the surgical neck and into the greater and likely lesser tuberosities, humeral head remains normally seated within the glenoid. Acromioclavicular and coracoclavicular intervals are  maintained. There is extensive overlying soft tissue swelling. A right chest wall Port-A-Cath is noted. Included portions of the right chest wall are free of acute traumatic finding or other abnormality. IMPRESSION: Comminuted  fracture of the proximal right humerus with extension through the surgical neck and likely extension into both greater and lesser tuberosities. Electronically Signed   By: Lovena Le M.D.   On: 10/15/2019 15:47    Procedures Procedures (including critical care time)  Medications Ordered in ED Medications  morphine 4 MG/ML injection 4 mg (4 mg Intravenous Given 10/15/19 1548)  ondansetron (ZOFRAN) injection 4 mg (4 mg Intravenous Given 10/15/19 1548)     Initial Impression / Assessment and Plan / ED Course  I have reviewed the triage vital signs and the nursing notes.  Pertinent labs & imaging results that were available during my care of the patient were reviewed by me and considered in my medical decision making (see chart for details).       Dean Pruitt is a 85 y.o. male here with fall. Had a seizure a week ago and is on keppra. Has obvious R humerus deformity. Will get labs, UA, CT head/neck, xrays. Will give pain meds.   5:02 PM Labs unremarkable. CT head/neck unremarkable. Xray showed proximal R humerus fracture. Shoulder immobilizer placed.  Daughter is now at bedside and states that he actually was holding onto the towel rack and slipped and fell onto the right shoulder. Patient did not have a seizure today and has help at home. Will dc home with ortho follow up, oxycodone for pain.      Final Clinical Impressions(s) / ED Diagnoses   Final diagnoses:  None    ED Discharge Orders    None       Drenda Freeze, MD 10/15/19 1704    Drenda Freeze, MD 10/15/19 1705

## 2019-10-15 NOTE — ED Triage Notes (Signed)
EMS reports from home, unwitnessed fall in restroom, obvious deformity to right upper arm. Strong urine odor. Hx of Dementia, states at baseline.   BP 128/88 HR 100 RR 20 SP02 92 RA CBG 159  20ga L forearm

## 2019-10-15 NOTE — Telephone Encounter (Signed)
Based on the ED note, he has had a seizure.  I agree with continuing [place] as it should help prevent further seizures.  It should be taken 500mg  twice daily.  At the very least, he should continue it until we can discuss it at his upcoming visit.

## 2019-10-15 NOTE — Discharge Instructions (Signed)
Take motrin for pain   Take oxycodone for severe pain   See your doctor. Follow up with orthopedic doctor in a week   Return to ER if you have another fall, severe pain, seizure, vomiting

## 2019-10-16 DIAGNOSIS — S42201A Unspecified fracture of upper end of right humerus, initial encounter for closed fracture: Secondary | ICD-10-CM | POA: Insufficient documentation

## 2019-10-16 DIAGNOSIS — G40909 Epilepsy, unspecified, not intractable, without status epilepticus: Secondary | ICD-10-CM | POA: Insufficient documentation

## 2019-10-16 NOTE — Telephone Encounter (Signed)
Called Cecille Rubin no answer left provider message on voice mail

## 2019-10-16 NOTE — Progress Notes (Deleted)
Subjective:    Patient ID: Dean Pruitt, male    DOB: [birth year], 85 y.o.   MRN: [id number]  HPI The patient is here for follow up from the ED. his daughter is here with him who provides most of the history given his dementia.   ED 10/15/2019 for fall, arm injury.  He had been into the emergency room 1 week prior for a seizure.  He was placed on Keppra at that time.  He has been agitated since then.  The day he went to the emergency room he fell on his right shoulder.  His daughter later gave the history that he was hanging onto a towel rack, slipped and fell.  There was no seizure like activity.  In the ED he did have a right humerus deformity.  He received pain medications.  Labs were unremarkable.  CT of the head and neck was unremarkable.  X-ray of the shoulder showed a proximal right humerus fracture.  He was placed in a shoulder immobilizer.  He was discharged home on oxycodone for pain and advised to follow-up with orthopedics.  Right humerus fracture: He has an appointment with neurology this month.  Seizures: He is taking Keppra twice daily as prescribed.  Medications and allergies reviewed with patient and updated if appropriate.  Patient Active Problem List   Diagnosis Date Noted  . Syncope 07/18/2019  . Diabetes mellitus without complication ([place]) 72/53/6644  . GERD (gastroesophageal reflux disease) 07/17/2019  . Loose stools 08/05/2018  . Ankle swelling, right 08/05/2018  . Urinary incontinence 08/05/2018  . Greater trochanteric bursitis, right 01/27/2018  . Otalgia, right 12/21/2017  . Bursitis of hip 11/17/2017  . Bilateral low back pain without sciatica 11/17/2017  . Thoracic aortic aneurysm without rupture ([place]) 11/09/2017  . Muscular chest pain 11/09/2017  . Recurrent falls 11/09/2017  . Hypothyroidism 10/13/2017  . B12 deficiency 10/13/2017  . Change in stool 10/13/2017  . Port catheter in place 06/02/2017  . Degenerative arthritis of left knee  03/24/2017  . Goals of care, counseling/discussion 03/20/2017  . Encounter for antineoplastic chemotherapy 03/20/2017  . Malignant mesothelioma of pleura ([place]) 02/19/2017  . History of lung biopsy 02/17/2017  . Greater trochanteric bursitis of left hip 12/31/2016  . Pleural effusion, left 10/27/2016  . infrarenal abdominal aortic aneurysm without rupture ([place]) - [place] needed 2020 10/22/2016  . Left lumbar radiculopathy 10/12/2016  . Bursitis of left hip 09/30/2016  . Left foot drop 09/30/2016  . Mild cognitive impairment 07/03/2016  . Depression 07/03/2016  . Urinary frequency 09/26/2014  . Macular degeneration 06/22/2011  . Prediabetes 05/20/2009  . NONSPECIFIC ABNORMAL ELECTROCARDIOGRAM 05/20/2009  . THYROID CANCER, HX OF 05/20/2009  . History of colonic polyps 05/20/2009  . Hyperlipidemia 11/21/2007    Current Outpatient Medications on File Prior to Visit  Medication Sig Dispense Refill  . aspirin 81 MG tablet Take 81 mg by mouth daily.    . bifidobacterium infantis (ALIGN) capsule Take 1 capsule by mouth daily.     . blood glucose meter kit and supplies KIT Use to check blood sugars daily. Dx Code-E11.9 1 each 0  . citalopram (CELEXA) 10 MG tablet Take 1 tablet (10 mg total) by mouth daily. 30 tablet 5  . dexamethasone (DECADRON) 4 MG tablet 1 tablet p.o. twice daily the day before, day of and day after chemotherapy every 3 weeks. 40 tablet 1  . donepezil (ARICEPT) 10 MG tablet TAKE 1 TABLET BY MOUTH AT BEDTIME 90 tablet 0  .  folic acid (FOLVITE) 1 MG tablet Take 1 tablet by mouth once daily 30 tablet 0  . gabapentin (NEURONTIN) 300 MG capsule TAKE 1 CAPSULE BY MOUTH AT NIGHT 90 capsule 1  . glucose blood (COOL BLOOD GLUCOSE TEST STRIPS) test strip Use to check blood sugars daily. Dx code: E11.9 100 each 1  . Lancets 28G MISC Use to check blood sugars daily. Dx E11.9 100 each 1  . levETIRAcetam (KEPPRA) 500 MG tablet Take 1 tablet (500 mg total) by mouth 2 (two) times daily. 60  tablet 0  . levothyroxine (SYNTHROID) 150 MCG tablet Take 1 tablet by mouth once daily 90 tablet 1  . lidocaine-prilocaine (EMLA) cream Apply 1 application topically as needed. 30 g 0  . meloxicam (MOBIC) 7.5 MG tablet Take 1 tablet by mouth once daily 90 tablet 0  . omeprazole (PRILOSEC) 20 MG capsule Take 1 capsule by mouth once daily 90 capsule 1  . oxyCODONE (ROXICODONE) 5 MG immediate release tablet Take 1 tablet (5 mg total) by mouth every 6 (six) hours as needed for severe pain. 12 tablet 0  . prochlorperazine (COMPAZINE) 10 MG tablet Take 1 tablet (10 mg total) by mouth every 6 (six) hours as needed for nausea or vomiting. (Patient not taking: Reported on 10/11/2019) 30 tablet 0  . rosuvastatin (CRESTOR) 20 MG tablet Take 1 tablet by mouth once daily 90 tablet 1  . [DISCONTINUED] folic acid (FOLVITE) 1 MG tablet Take 1 tablet (1 mg total) by mouth daily. 30 tablet 2   No current facility-administered medications on file prior to visit.     Past Medical History:  Diagnosis Date  . Cataract   . Depression    situational  . GERD (gastroesophageal reflux disease)   . Goals of care, counseling/discussion 03/20/2017  . Hyperlipidemia   . Hypothyroidism   . Reactive depression 03/20/2017  . Thyroid cancer ([place])    PMH of; on supressive therapy    Past Surgical History:  Procedure Laterality Date  . CATARACT EXTRACTION, BILATERAL  2013 & 2014   Dr Celene Squibb  . colon polyps  2002 & 2005   negative 2008; Dr Earlean Shawl  . COLONOSCOPY W/ POLYPECTOMY    . LUNG BIOPSY Left 02/15/2017   Procedure: LUNG BIOPSY;  Surgeon: Melrose Nakayama, MD;  Location: [facility];  Service: Thoracic;  Laterality: Left;  . PLEURAL EFFUSION DRAINAGE Left 02/15/2017   Procedure: DRAINAGE OF PLEURAL EFFUSION;  Surgeon: Melrose Nakayama, MD;  Location: [facility];  Service: Thoracic;  Laterality: Left;  . PORTACATH PLACEMENT N/A 03/25/2017   Procedure: INSERTION PORT-A-CATH, right internal jugular;  Surgeon: Melrose Nakayama, MD;  Location: [facility];  Service: Thoracic;  Laterality: N/A;  . THYROIDECTOMY  2001   S/P RAI  . VIDEO ASSISTED THORACOSCOPY Left 02/15/2017   Procedure: VIDEO ASSISTED THORACOSCOPY;  Surgeon: Melrose Nakayama, MD;  Location: [facility];  Service: Thoracic;  Laterality: Left;    Social History   Socioeconomic History  . Marital status: Widowed    Spouse name: Not on file  . Number of children: 2  . Years of education: Not on file  . Highest education level: Not on file  Occupational History  . Occupation: Retired  Scientific laboratory technician  . Financial resource strain: Not on file  . Food insecurity    Worry: Not on file    Inability: Not on file  . Transportation needs    Medical: Not on file    Non-medical: Not on  file  Tobacco Use  . Smoking status: Former Smoker    Packs/day: 1.00    Years: 5.00    Pack years: 5.00    Types: Cigarettes    Quit date: 12/07/1958    Years since quitting: 60.8  . Smokeless tobacco: Never Used  . Tobacco comment: smoked 1956-1960, up 1 ppd  Substance and Sexual Activity  . Alcohol use: Yes    Alcohol/week: 14.0 standard drinks    Types: 14 Glasses of wine per week  . Drug use: No  . Sexual activity: Not on file  Lifestyle  . Physical activity    Days per week: Not on file    Minutes per session: Not on file  . Stress: Not on file  Relationships  . Social Herbalist on phone: Not on file    Gets together: Not on file    Attends religious service: Not on file    Active member of club or organization: Not on file    Attends meetings of clubs or organizations: Not on file    Relationship status: Not on file  Other Topics Concern  . Not on file  Social History Narrative   Exercise:  Yard work, walks on occasion                Family History  Problem Relation Age of Onset  . Alzheimer's disease Mother   . Hypertension Mother   . Heart disease Father        CAD and angioplasty in 14s  . Cancer Father        Bladder   . Other Brother        valvular heart disease  . Diabetes Neg Hx   . Stroke Neg Hx     Review of Systems     Objective:  There were no vitals filed for this visit. BP Readings from Last 3 Encounters:  10/15/19 (!) 142/88  10/11/19 (!) 142/96  10/11/19 (!) 139/97   Wt Readings from Last 3 Encounters:  10/15/19 201 lb 11.5 oz (91.5 kg)  10/11/19 201 lb 12.8 oz (91.5 kg)  09/21/19 201 lb 4.8 oz (91.3 kg)   There is no height or weight on file to calculate BMI.   Physical Exam    Constitutional: Appears well-developed and well-nourished. No distress.  HENT:  Head: Normocephalic and atraumatic.  Neck: Neck supple. No tracheal deviation present. No thyromegaly present.  No cervical lymphadenopathy Cardiovascular: Normal rate, regular rhythm and normal heart sounds.   No murmur heard. No carotid bruit .  No edema Pulmonary/Chest: Effort normal and breath sounds normal. No respiratory distress. No has no wheezes. No rales.  Skin: Skin is warm and dry. Not diaphoretic.  Psychiatric: Normal mood and affect. Behavior is normal.      Assessment & Plan:    See Problem List for Assessment and Plan of chronic medical problems.

## 2019-10-17 ENCOUNTER — Ambulatory Visit: Payer: PPO | Admitting: Internal Medicine

## 2019-10-17 ENCOUNTER — Telehealth: Payer: Self-pay | Admitting: Internal Medicine

## 2019-10-17 NOTE — Telephone Encounter (Signed)
Patient's daughter called stating that she is unable to transport the patient. They were scheduled for an ED follow up here today, but she was not able to get the patient in the car to bring him to his appointment. He was also referred to ortho due to a broken arm but she will not be able to take him to that appointment either. She is concerned and does not know what to do. Scheduling non-emergent transportation to come to the office is very expensive. The only thing that she thinks would work is to call EMS to take him back to the hospital and for someone there to look at his arm again.   Spoke to [employer], she agrees that patient would most likely need to go back to the hospital due to the fact that we are not able to do much here regarding the broken arm and without transportation.   Called patient's daughter to inform. I also mentioned that she could set up a virtual visit to discuss everything that is going on with Dr Quay Burow. She expressed understanding and said she would take him back to the hospital either this evening or in the morning. She said that he is comfortable at home right now.  She will call back to schedule a virtual when things have settled down.

## 2019-10-17 NOTE — Telephone Encounter (Signed)
F/u broken arm. Dtr called here for recommendation to getting Dean Pruitt's Right arm  evaluated. He broke his humerus on Sunday.I told her to take him to ED per Dr Quay Burow recommendation and to contact ortho with her decision .

## 2019-10-18 ENCOUNTER — Inpatient Hospital Stay: Payer: PPO

## 2019-10-18 ENCOUNTER — Emergency Department (HOSPITAL_COMMUNITY): Payer: PPO

## 2019-10-18 ENCOUNTER — Emergency Department (HOSPITAL_COMMUNITY)
Admission: EM | Admit: 2019-10-18 | Discharge: 2019-10-19 | Disposition: A | Discharge status: Home / Self Care | Payer: PPO | Attending: Emergency Medicine | Admitting: Emergency Medicine

## 2019-10-18 DIAGNOSIS — R531 Weakness: Secondary | ICD-10-CM | POA: Diagnosis not present

## 2019-10-18 DIAGNOSIS — Z87891 Personal history of nicotine dependence: Secondary | ICD-10-CM | POA: Diagnosis not present

## 2019-10-18 DIAGNOSIS — S42201A Unspecified fracture of upper end of right humerus, initial encounter for closed fracture: Secondary | ICD-10-CM | POA: Diagnosis not present

## 2019-10-18 DIAGNOSIS — F0390 Unspecified dementia without behavioral disturbance: Secondary | ICD-10-CM | POA: Insufficient documentation

## 2019-10-18 DIAGNOSIS — Z79899 Other long term (current) drug therapy: Secondary | ICD-10-CM | POA: Insufficient documentation

## 2019-10-18 DIAGNOSIS — S42291S Other displaced fracture of upper end of right humerus, sequela: Secondary | ICD-10-CM

## 2019-10-18 DIAGNOSIS — S42294D Other nondisplaced fracture of upper end of right humerus, subsequent encounter for fracture with routine healing: Secondary | ICD-10-CM | POA: Insufficient documentation

## 2019-10-18 DIAGNOSIS — R0902 Hypoxemia: Secondary | ICD-10-CM | POA: Diagnosis not present

## 2019-10-18 DIAGNOSIS — Z7982 Long term (current) use of aspirin: Secondary | ICD-10-CM | POA: Insufficient documentation

## 2019-10-18 DIAGNOSIS — W19XXXD Unspecified fall, subsequent encounter: Secondary | ICD-10-CM | POA: Diagnosis not present

## 2019-10-18 DIAGNOSIS — E119 Type 2 diabetes mellitus without complications: Secondary | ICD-10-CM | POA: Diagnosis not present

## 2019-10-18 DIAGNOSIS — R410 Disorientation, unspecified: Secondary | ICD-10-CM | POA: Diagnosis not present

## 2019-10-18 DIAGNOSIS — S42201S Unspecified fracture of upper end of right humerus, sequela: Secondary | ICD-10-CM | POA: Diagnosis not present

## 2019-10-18 DIAGNOSIS — E038 Other specified hypothyroidism: Secondary | ICD-10-CM | POA: Diagnosis not present

## 2019-10-18 DIAGNOSIS — W19XXXA Unspecified fall, initial encounter: Secondary | ICD-10-CM | POA: Diagnosis not present

## 2019-10-18 DIAGNOSIS — R0602 Shortness of breath: Secondary | ICD-10-CM | POA: Diagnosis not present

## 2019-10-18 DIAGNOSIS — J90 Pleural effusion, not elsewhere classified: Secondary | ICD-10-CM | POA: Diagnosis not present

## 2019-10-18 DIAGNOSIS — R5381 Other malaise: Secondary | ICD-10-CM | POA: Diagnosis not present

## 2019-10-18 DIAGNOSIS — R627 Adult failure to thrive: Secondary | ICD-10-CM | POA: Diagnosis present

## 2019-10-18 LAB — BASIC METABOLIC PANEL
Anion gap: 12 (ref 5–15)
BUN: 46 mg/dL — ABNORMAL HIGH (ref 8–23)
CO2: 23 mmol/L (ref 22–32)
Calcium: 9.3 mg/dL (ref 8.9–10.3)
Chloride: 101 mmol/L (ref 98–111)
Creatinine, Ser: 0.87 mg/dL (ref 0.61–1.24)
GFR calc Af Amer: >60 mL/min (ref >60)
GFR calc non Af Amer: >60 mL/min (ref >60)
Glucose, Bld: 117 mg/dL — ABNORMAL HIGH (ref 70–99)
Potassium: 4.2 mmol/L (ref 3.5–5.1)
Sodium: 136 mmol/L (ref 135–145)

## 2019-10-18 LAB — CBC WITH DIFFERENTIAL/PLATELET
Abs Immature Granulocytes: 0.01 10*3/uL (ref 0.00–0.07)
Basophils Absolute: 0 10*3/uL (ref 0.0–0.1)
Basophils Relative: 1 %
Eosinophils Absolute: 0 10*3/uL (ref 0.0–0.5)
Eosinophils Relative: 2 %
HCT: 40 % (ref 39.0–52.0)
Hemoglobin: 13.6 g/dL (ref 13.0–17.0)
Immature Granulocytes: 1 %
Lymphocytes Relative: 33 %
Lymphs Abs: 0.6 10*3/uL — ABNORMAL LOW (ref 0.7–4.0)
MCH: 36.7 pg — ABNORMAL HIGH (ref 26.0–34.0)
MCHC: 34 g/dL (ref 30.0–36.0)
MCV: 107.8 fL — ABNORMAL HIGH (ref 80.0–100.0)
Monocytes Absolute: 0.4 10*3/uL (ref 0.1–1.0)
Monocytes Relative: 18 %
Neutro Abs: 0.9 10*3/uL — ABNORMAL LOW (ref 1.7–7.7)
Neutrophils Relative %: 45 %
Platelets: 95 10*3/uL — ABNORMAL LOW (ref 150–400)
RBC: 3.71 MIL/uL — ABNORMAL LOW (ref 4.22–5.81)
RDW: 13.1 % (ref 11.5–15.5)
WBC: 2 10*3/uL — ABNORMAL LOW (ref 4.0–10.5)
nRBC: 0 % (ref 0.0–0.2)

## 2019-10-18 MED ORDER — SODIUM CHLORIDE (PF) 0.9 % IJ SOLN
INTRAMUSCULAR | Status: AC
  Filled 2019-10-18: qty 50

## 2019-10-18 MED ORDER — IOHEXOL 350 MG/ML SOLN
100.0000 mL | Freq: Once | INTRAVENOUS | Status: AC | PRN
  Administered 2019-10-18: 100 mL via INTRAVENOUS

## 2019-10-18 NOTE — Progress Notes (Addendum)
TOC CM contacted [facility] and they do not have light weight manual wheelchair. Contacted [employer] and they do have standard wheelchairs. Contacted [facility] rep, Jeneen Rinks and they do have wheelchairs but not leg rest. States he will verify tomorrow. Office is closed for today.  Pt will possibly need oxygen at dc. Will continue to follow for dc needs. [place], [place] ED TOC CM [phone]

## 2019-10-18 NOTE — ED Notes (Signed)
Dean Pruitt (daughter) would like a call/update at [phone]

## 2019-10-18 NOTE — ED Provider Notes (Signed)
[facility] DEPT Provider Note   CSN: [id number] Arrival date & time: 10/18/19  1141     History   Chief Complaint No chief complaint on file.   HPI Dean Pruitt is a 85 y.o. male presenting for evaluation of failure to thrive since a fall several days ago.   Level V caveat due to dementia.   History provided by daughter via phone. Per daughter, pt fell 2 days ago and broke his L shoulder. she has been unable to get him up to his dr's apt for the past 2 days, and he will not eat due to his arm injury. As such, EMS was called to bring pt to the hospital. Pt was supposed to follow up with Dr. Mardelle Matte today, she is hoping to get an ortho evaluation while pt is in the ED.  EMS found pt to be slightly hypoxic on RA, daughter states this has been present for the past few days.  Additional history obtained from chart review.  Patient unable to go to his primary care doctor yesterday per messaging system.  History of mesothelioma, followed up with oncology.  Additional history of seizures on Keppra, diabetes, hypertension, infrarenal aortic aneurysm, pleural effusion.  Visit from 2 days ago reviewed, patient had imaging including of his head, neck, chest, extremities.     HPI  Past Medical History:  Diagnosis Date  . Cataract   . Depression    situational  . GERD (gastroesophageal reflux disease)   . Goals of care, counseling/discussion 03/20/2017  . Hyperlipidemia   . Hypothyroidism   . Reactive depression 03/20/2017  . Thyroid cancer [facility])    PMH of; on supressive therapy    Patient Active Problem List   Diagnosis Date Noted  . Closed fracture of proximal end of right humerus 10/16/2019  . Seizure disorder ([place]) 10/16/2019  . Syncope 07/18/2019  . Diabetes mellitus without complication ([place]) 83/41/9622  . GERD (gastroesophageal reflux disease) 07/17/2019  . Loose stools 08/05/2018  . Ankle swelling, right 08/05/2018  . Urinary incontinence  08/05/2018  . Greater trochanteric bursitis, right 01/27/2018  . Otalgia, right 12/21/2017  . Bursitis of hip 11/17/2017  . Bilateral low back pain without sciatica 11/17/2017  . Thoracic aortic aneurysm without rupture ([place]) 11/09/2017  . Muscular chest pain 11/09/2017  . Recurrent falls 11/09/2017  . Hypothyroidism 10/13/2017  . B12 deficiency 10/13/2017  . Change in stool 10/13/2017  . Port catheter in place 06/02/2017  . Degenerative arthritis of left knee 03/24/2017  . Goals of care, counseling/discussion 03/20/2017  . Encounter for antineoplastic chemotherapy 03/20/2017  . Malignant mesothelioma of pleura ([place]) 02/19/2017  . History of lung biopsy 02/17/2017  . Greater trochanteric bursitis of left hip 12/31/2016  . Pleural effusion, left 10/27/2016  . infrarenal abdominal aortic aneurysm without rupture ([place]) - [place] needed 2020 10/22/2016  . Left lumbar radiculopathy 10/12/2016  . Bursitis of left hip 09/30/2016  . Left foot drop 09/30/2016  . Mild cognitive impairment 07/03/2016  . Depression 07/03/2016  . Urinary frequency 09/26/2014  . Macular degeneration 06/22/2011  . Prediabetes 05/20/2009  . NONSPECIFIC ABNORMAL ELECTROCARDIOGRAM 05/20/2009  . THYROID CANCER, HX OF 05/20/2009  . History of colonic polyps 05/20/2009  . Hyperlipidemia 11/21/2007    Past Surgical History:  Procedure Laterality Date  . CATARACT EXTRACTION, BILATERAL  2013 & 2014   Dr Celene Squibb  . colon polyps  2002 & 2005   negative 2008; Dr Earlean Shawl  . COLONOSCOPY W/ POLYPECTOMY    .  LUNG BIOPSY Left 02/15/2017   Procedure: LUNG BIOPSY;  Surgeon: Melrose Nakayama, MD;  Location: [facility];  Service: Thoracic;  Laterality: Left;  . PLEURAL EFFUSION DRAINAGE Left 02/15/2017   Procedure: DRAINAGE OF PLEURAL EFFUSION;  Surgeon: Melrose Nakayama, MD;  Location: [facility];  Service: Thoracic;  Laterality: Left;  . PORTACATH PLACEMENT N/A 03/25/2017   Procedure: INSERTION PORT-A-CATH, right internal jugular;   Surgeon: Melrose Nakayama, MD;  Location: [facility];  Service: Thoracic;  Laterality: N/A;  . THYROIDECTOMY  2001   S/P RAI  . VIDEO ASSISTED THORACOSCOPY Left 02/15/2017   Procedure: VIDEO ASSISTED THORACOSCOPY;  Surgeon: Melrose Nakayama, MD;  Location: [facility];  Service: Thoracic;  Laterality: Left;        Home Medications    Prior to Admission medications   Medication Sig Start Date End Date Taking? Authorizing Provider  aspirin 81 MG tablet Take 81 mg by mouth daily.    [provider]  bifidobacterium infantis (ALIGN) capsule Take 1 capsule by mouth daily.     [provider]  blood glucose meter kit and supplies KIT Use to check blood sugars daily. Dx Code-E11.9 07/19/19   [name], MD  citalopram (CELEXA) 10 MG tablet Take 1 tablet (10 mg total) by mouth daily. 08/15/19   Tomi Likens, Adam R, DO  dexamethasone (DECADRON) 4 MG tablet 1 tablet p.o. twice daily the day before, day of and day after chemotherapy every 3 weeks. 05/29/19   Curt Bears, MD  donepezil (ARICEPT) 10 MG tablet TAKE 1 TABLET BY MOUTH AT BEDTIME 07/12/19   Pieter Partridge, DO  folic acid (FOLVITE) 1 MG tablet Take 1 tablet by mouth once daily 10/02/19   Curt Bears, MD  gabapentin (NEURONTIN) 300 MG capsule TAKE 1 CAPSULE BY MOUTH AT NIGHT 07/18/19   Burns, Claudina Lick, MD  glucose blood (COOL BLOOD GLUCOSE TEST STRIPS) test strip Use to check blood sugars daily. Dx code: E11.9 07/21/19   [name], MD  Lancets 28G MISC Use to check blood sugars daily. Dx E11.9 07/21/19   [name], MD  levETIRAcetam (KEPPRA) 500 MG tablet Take 1 tablet (500 mg total) by mouth 2 (two) times daily. 10/11/19   Milton Ferguson, MD  levothyroxine (SYNTHROID) 150 MCG tablet Take 1 tablet by mouth once daily 08/28/19   [name], MD  lidocaine-prilocaine (EMLA) cream Apply 1 application topically as needed. 05/25/19   Curt Bears, MD  meloxicam [facility]) 7.5 MG tablet Take 1 tablet by mouth once daily  06/20/19   Lyndal Pulley, DO  omeprazole (PRILOSEC) 20 MG capsule Take 1 capsule by mouth once daily 08/28/19   [name], MD  oxyCODONE (ROXICODONE) 5 MG immediate release tablet Take 1 tablet (5 mg total) by mouth every 6 (six) hours as needed for severe pain. 10/15/19   Drenda Freeze, MD  rosuvastatin (CRESTOR) 20 MG tablet Take 1 tablet by mouth once daily 10/02/19   [name], MD  folic acid (FOLVITE) 1 MG tablet Take 1 tablet (1 mg total) by mouth daily. 05/25/19   Curt Bears, MD    Family History Family History  Problem Relation Age of Onset  . Alzheimer's disease Mother   . Hypertension Mother   . Heart disease Father        CAD and angioplasty in 91s  . Cancer Father        Bladder  . Other Brother  valvular heart disease  . Diabetes Neg Hx   . Stroke Neg Hx     Social History Social History   Tobacco Use  . Smoking status: Former Smoker    Packs/day: 1.00    Years: 5.00    Pack years: 5.00    Types: Cigarettes    Quit date: 12/07/1958    Years since quitting: 60.9  . Smokeless tobacco: Never Used  . Tobacco comment: smoked 1956-1960, up 1 ppd  Substance Use Topics  . Alcohol use: Yes    Alcohol/week: 14.0 standard drinks    Types: 14 Glasses of wine per week  . Drug use: No     Allergies   Ativan [lorazepam], Fentanyl, and Tramadol   Review of Systems Review of Systems  Unable to perform ROS: Dementia     Physical Exam Updated Vital Signs BP (!) 136/102   Pulse 76   Temp 97.8 F (36.6 C) (Oral)   Resp 16   Ht 6' (1.829 m)   SpO2 96%   BMI 27.36 kg/m   Physical Exam Vitals signs and nursing note reviewed.  Constitutional:      General: He is not in acute distress.    Appearance: He is well-developed.     Comments: Elderly male who appears nontoxic.   HENT:     Head: Normocephalic and atraumatic.  Eyes:     Extraocular Movements: Extraocular movements intact.     Conjunctiva/sclera: Conjunctivae normal.      Pupils: Pupils are equal, round, and reactive to light.  Neck:     Musculoskeletal: Normal range of motion and neck supple.  Cardiovascular:     Rate and Rhythm: Normal rate and regular rhythm.     Pulses: Normal pulses.  Pulmonary:     Effort: Pulmonary effort is normal. No respiratory distress.     Breath sounds: Normal breath sounds. No wheezing.     Comments: No signs of respiratory distress. Clear lung sounds.  Abdominal:     General: There is no distension.     Palpations: Abdomen is soft. There is no mass.     Tenderness: There is no abdominal tenderness. There is no guarding or rebound.  Musculoskeletal: Normal range of motion.     Right lower leg: No edema.     Left lower leg: No edema.     Comments: No leg pain or swelling.   Skin:    General: Skin is warm and dry.     Capillary Refill: Capillary refill takes less than 2 seconds.  Neurological:     Mental Status: He is alert and oriented to person, place, and time.      ED Treatments / Results  Labs (all labs ordered are listed, but only abnormal results are displayed) Labs Reviewed  CBC WITH DIFFERENTIAL/PLATELET - Abnormal; Notable for the following components:      Result Value   WBC 2.0 (*)    RBC 3.71 (*)    MCV 107.8 (*)    MCH 36.7 (*)    Platelets 95 (*)    Neutro Abs 0.9 (*)    Lymphs Abs 0.6 (*)    All other components within normal limits  BASIC METABOLIC PANEL - Abnormal; Notable for the following components:   Glucose, Bld 117 (*)    BUN 46 (*)    All other components within normal limits    EKG None  Radiology Dg Chest 2 View  Result Date: 10/18/2019 CLINICAL DATA:  85 year old male  with hypoxia. EXAM: CHEST - 2 VIEW COMPARISON:  Chest radiograph dated 10/15/2019. FINDINGS: Left lung base atelectasis/scarring. No focal consolidation, or pneumothorax. Trace right pleural effusion may be present. There is stable cardiomegaly. No acute osseous pathology. Right pectoral Port-A-Cath in similar  position. IMPRESSION: 1. Stable cardiomegaly. No focal consolidation. 2. Trace right pleural effusion. Electronically Signed   By: Anner Crete M.D.   On: 10/18/2019 14:36    Procedures Procedures (including critical care time)  Medications Ordered in ED Medications - No data to display   Initial Impression / Assessment and Plan / ED Course  I have reviewed the triage vital signs and the nursing notes.  Pertinent labs & imaging results that were available during my care of the patient were reviewed by me and considered in my medical decision making (see chart for details).        Patient presenting for evaluation of decreased movement and failure to thrive since injury several days ago.  Physical exam shows patient who appears nontoxic.  Limited history due to dementia.  Will discuss with social work regarding assisted at home, to see if patient needs placement.  X-ray obtained due to low oxygen.  Will obtain basic blood work.  Labs from patient's baseline, leukopenia at 2.0, likely due to mesothelioma.  Electrolytes stable.  Just would not recommend, shows a small R effusion.  Otherwise no pneumonia. Will order CTA to r/o PE.   discussed with Dr. Griffin Basil from ortho per daughter's request.  Recommends 1 week follow-up for repeat imaging to decide if surgery is necessary. Requests CT of shoulder.  Social work discussed case with daughter, will assist with home health if patient is going home.  I discussed with daughter, Cecille Rubin, who is aware of pending chest imaging and repeat assessment of vitals.  If CTA is negative and sats are reassuring, patient can be discharged with home health.  If CTA is abnormal, or patient remains hypoxic, he may need admission.   Pt signed out to Brandon Melnick, PA-C for f/u on CTA.    Final Clinical Impressions(s) / ED Diagnoses   Final diagnoses:  None    ED Discharge Orders    None       Franchot Heidelberg, PA-C 10/18/19 1646    Blanchie Dessert, MD 10/19/19 [phone]

## 2019-10-18 NOTE — ED Provider Notes (Signed)
..      Durable Medical Equipment  (From admission, onward)         Start     Ordered   10/18/19 1547  For home use only DME 3 n 1  Once     10/18/19 1554   10/18/19 1547  For home use only DME lightweight manual wheelchair with seat cushion  Once    Comments: Patient suffers from dementia, right arm fracture, lung cancer which impairs their ability to perform daily activities like standing, walking, bathing in the home.  A rolling walker will not resolve  issue with performing activities of daily living. A wheelchair will allow patient to safely perform daily activities. Patient is not able to propel themselves in the home using a standard weight wheelchair due to weakness. Patient can self propel in the lightweight wheelchair or family can push patient. Length of need lifetime. Accessories: elevating leg rests (ELRs), wheel locks, extensions and anti-tippers, back cushion   10/18/19 1554            Blanchie Dessert, MD 10/18/19 1558

## 2019-10-18 NOTE — ED Notes (Addendum)
[facility] called for transport.  

## 2019-10-18 NOTE — Evaluation (Signed)
Physical Therapy Evaluation Patient Details Name: Dean Pruitt MRN: [id number] DOB: 24-Feb-1934 Today's Date: 10/18/2019   History of Present Illness  85 year old: Pt comes to ED with DTR stating he has not gotten up out of the chair since he fratured his R arm 2 days ago ( 10/16/2019). Comminuted fracture of the proximal right humerus with extension through the surgical neck and likely extension into both greater and lesser tuberosities. Pt with history of  mesothelioma, followed up with oncology.  Additional history of seizures on Keppra, diabetes, hypertension, infrarenal aortic aneurysm, pleural effusion. PT's dtr staes he was ambulatory before fall 2 days ago, however reports his dementia has been getting worse.    Clinical Impression  Pt in ED hallway stretcher bed. See history above. Pt with great amounts of swelling, darkened red/blue on lateral aspect of R proximal shoulder area. Nurse present and aware. We adjusted the sling in sitting and pt with great amount of pain with very little movement. Pt very talkative during assessment and pleasant , but unintelligible conversations, however did follow one step commands with gestures and visual cues. Worked with sit to stands and attempted taking a few steps, however pt with great festinating gait, small steps, small base of support and required 2 person assist for safety at this time. REcommend ST-SNF, howevr CM aware and will discuss with dtr because she is wanting to take pt home. Will need full care and assist and equipment. Will follow while here in hospital. Thank you.     Follow Up Recommendations SNF(however pt's dtr wanting to take him home)    Equipment Recommendations  Hospital bed;Wheelchair (measurements PT);Wheelchair cushion (measurements PT)    Recommendations for Other Services       Precautions / Restrictions Precautions Precautions: Other (comment);Fall Precaution Comments: R UE NWB with R humeral fracture and sling  at all times Required Braces or Orthoses: Sling(R UE) Restrictions Weight Bearing Restrictions: Yes RUE Weight Bearing: Non weight bearing(assuming due to extensive fracture on R humerus and humeral head)      Mobility  Bed Mobility Overal bed mobility: Needs Assistance Bed Mobility: Supine to Sit;Sit to Supine     Supine to sit: +2 for physical assistance;Max assist Sit to supine: Max assist;+2 for physical assistance   General bed mobility comments: pt did help some, but very difficult with R UE fracture and pain and pt didn't understand he could not use it , but found out anytime he tried to move it even a little  Transfers Overall transfer level: Needs assistance Equipment used: 1 person hand held assist(hand held only on LUE) Transfers: Sit to/from Stand Sit to Stand: Mod assist;+2 physical assistance         General transfer comment: performed sit to stand 4 times for postioning, and pt would sit down before we wanted to progress him to take a few steps. +2 for help but mainly for safety due to unpredicatable with dementia and wanted to keep pt safe and secure  Ambulation/Gait Ambulation/Gait assistance: +2 physical assistance Gait Distance (Feet): 5 Feet Assistive device: 1 person hand held assist Gait Pattern/deviations: Decreased stride length;Festinating;Narrow base of support     General Gait Details: hand held on LUE and other assit on other side assisting around waist ( not RUE due to sling and fracture). poserior lean, shuffled , small fesinating gait, seemed to be weak and fearful, but had to hold him up with posterior lean  Stairs  Wheelchair Mobility    Modified Rankin (Stroke Patients Only)       Balance Overall balance assessment: Needs assistance Sitting-balance support: Single extremity supported;Feet supported Sitting balance-Leahy Scale: Fair   Postural control: Posterior lean Standing balance support: During functional  activity Standing balance-Leahy Scale: Poor                               Pertinent Vitals/Pain Pain Assessment: Faces Faces Pain Scale: Hurts whole lot Pain Location: grimaces and cries out for R shoulder with very little movement    Home Living Family/patient expects to be discharged to:: Private residence Living Arrangements: Children Available Help at Discharge: Family Type of Home: House         Home Equipment: None Additional Comments: unsuer due to pt unable to give history and PLOF, however from chart and Case manager they report pt lives with dtr and he was ambularoty prior to fall 2 days ago. Since then he has not gotten up out of the chair.    Prior Function Level of Independence: Needs assistance         Comments: unsure of PLOF except was was reported above.     Hand Dominance        Extremity/Trunk Assessment        Lower Extremity Assessment Lower Extremity Assessment: Generalized weakness(great weakness with general fuctional movements as bed mobilit, sit to stand and few ambulatory steps)       Communication   Communication: Other (comment)(pt very talkative however not inteliggble conversation. Does respond to single commnads and gestures .)  Cognition Arousal/Alertness: Awake/alert   Overall Cognitive Status: No family/caregiver present to determine baseline cognitive functioning                                 General Comments: per chart pt does have some dementia , however states that it has gotten worse over the past few weeks and espcially past 2 days. Pt pleasant and talkative , but not intellible conversation, follows one step commnads with visial cues and motions for functional tasks.      General Comments      Exercises     Assessment/Plan    PT Assessment Patient needs continued PT services  PT Problem List Decreased strength;Decreased activity tolerance;Decreased balance;Decreased mobility        PT Treatment Interventions Gait training;Functional mobility training;Therapeutic activities;Therapeutic exercise    PT Goals (Current goals can be found in the Care Plan section)  Acute Rehab PT Goals PT Goal Formulation: Patient unable to participate in goal setting Time For Goal Achievement: 11/01/19 Potential to Achieve Goals: Fair    Frequency Min 3X/week   Barriers to discharge        Co-evaluation               AM-PAC PT "6 Clicks" Mobility  Outcome Measure Help needed turning from your back to your side while in a flat bed without using bedrails?: A Lot Help needed moving from lying on your back to sitting on the side of a flat bed without using bedrails?: A Lot Help needed moving to and from a bed to a chair (including a wheelchair)?: A Lot Help needed standing up from a chair using your arms (e.g., wheelchair or bedside chair)?: A Lot Help needed to walk in hospital room?: A Lot Help needed climbing  3-5 steps with a railing? : Total 6 Click Score: 11    End of Session Equipment Utilized During Treatment: Gait belt Activity Tolerance: Patient limited by pain;Patient limited by fatigue Patient left: in bed(stretcher in ED in hallway) Nurse Communication: Mobility status;Precautions PT Visit Diagnosis: Unsteadiness on feet (R26.81);Other abnormalities of gait and mobility (R26.89);Muscle weakness (generalized) (M62.81);History of falling (Z91.81)    Time: 1700-1740 PT Time Calculation (min) (ACUTE ONLY): 40 min   Charges:   PT Evaluation $PT Eval Moderate Complexity: 1 Mod PT Treatments $Therapeutic Activity: 8-22 mins        Clide Dales, PT Acute Rehabilitation Services Pager: [phone] Office: [phone] 10/18/2019   Clide Dales 10/18/2019, 6:18 PM

## 2019-10-18 NOTE — TOC Initial Note (Signed)
Transition of Care [facility]) - Initial/Assessment Note    Patient Details  Name: Dean Pruitt MRN: [id number] Date of Birth: 01/17/1934  Transition of [facility]) CM/SW Contact:    Dean Hoard, LCSW Phone Number: 10/18/2019, 2:26 PM  Clinical Narrative:                 CSW spoke with patient's daughter Dean Pruitt [phone]) about patient. Dean Pruitt reports patient fell on Sunday and fractured his right arm and since then he has been sitting in a recliner not eating or drinking put and refusing to get out of the chair. She reports patient has a follow up appointment with ortho today, but she could not get patient to get up. She reports patient's dementia has seem to worsen since his fall. She was hoping patient could be seen by ortho while in the ED to determine if surgery is needed. CSW explained that an OP orthopedist may not come see patient in the ED. CSW did place a consult for PT to evaluate patient to see if he could benefit from PT/OT with SNF or HH. CSW also explained that per PA seeing patient they are waiting to get patient's lab work back to see if he may need to be admitted to the medical floor. Dean Pruitt reports overall she would like her father to come back home. CSW will continue to follow up.    Expected Discharge Plan: Skilled Nursing Facility Barriers to Discharge: Continued Medical Work up   Patient Goals and CMS Choice Patient states their goals for this hospitalization and ongoing recovery are:: Per daughter "to get his arm looked at and to get better."      Expected Discharge Plan and Services Expected Discharge Plan: [place] In-house Referral: Clinical Social Work     Living arrangements for the past 2 months: Single Family Home                                      Prior Living Arrangements/Services Living arrangements for the past 2 months: Single Family Home Lives with:: Adult Children Patient language and need for interpreter  reviewed:: Yes Do you feel safe going back to the place where you live?: Yes      Need for Family Participation in Patient Care: Yes (Comment) Care giver support system in place?: Yes (comment)   Criminal Activity/Legal Involvement Pertinent to Current Situation/Hospitalization: No - Comment as needed  Activities of Daily Living      Permission Sought/Granted Permission sought to share information with : Family Supports Permission granted to share information with : Yes, Verbal Permission Granted  Share Information with NAME: Dean Pruitt     Permission granted to share info w Relationship: daughter  Permission granted to share info w Contact Information: [phone]  Emotional Assessment Appearance:: (unable to assess) Attitude/Demeanor/Rapport: Unable to Assess Affect (typically observed): Unable to Assess Orientation: : Fluctuating Orientation (Suspected and/or reported Sundowners)      Admission diagnosis:  unable to eat due to right arm fracture Patient Active Problem List   Diagnosis Date Noted  . Closed fracture of proximal end of right humerus 10/16/2019  . Seizure disorder ([place]) 10/16/2019  . Syncope 07/18/2019  . Diabetes mellitus without complication ([place]) 22/97/9892  . GERD (gastroesophageal reflux disease) 07/17/2019  . Loose stools 08/05/2018  . Ankle swelling, right 08/05/2018  . Urinary incontinence 08/05/2018  . Greater trochanteric bursitis, right  01/27/2018  . Otalgia, right 12/21/2017  . Bursitis of hip 11/17/2017  . Bilateral low back pain without sciatica 11/17/2017  . Thoracic aortic aneurysm without rupture ([place]) 11/09/2017  . Muscular chest pain 11/09/2017  . Recurrent falls 11/09/2017  . Hypothyroidism 10/13/2017  . B12 deficiency 10/13/2017  . Change in stool 10/13/2017  . Port catheter in place 06/02/2017  . Degenerative arthritis of left knee 03/24/2017  . Goals of care, counseling/discussion 03/20/2017  . Encounter for antineoplastic  chemotherapy 03/20/2017  . Malignant mesothelioma of pleura ([place]) 02/19/2017  . History of lung biopsy 02/17/2017  . Greater trochanteric bursitis of left hip 12/31/2016  . Pleural effusion, left 10/27/2016  . infrarenal abdominal aortic aneurysm without rupture ([place]) - [place] needed 2020 10/22/2016  . Left lumbar radiculopathy 10/12/2016  . Bursitis of left hip 09/30/2016  . Left foot drop 09/30/2016  . Mild cognitive impairment 07/03/2016  . Depression 07/03/2016  . Urinary frequency 09/26/2014  . Macular degeneration 06/22/2011  . Prediabetes 05/20/2009  . NONSPECIFIC ABNORMAL ELECTROCARDIOGRAM 05/20/2009  . THYROID CANCER, HX OF 05/20/2009  . History of colonic polyps 05/20/2009  . Hyperlipidemia 11/21/2007   PCP:  [name], MD Pharmacy:   [facility], [place] [address] Phone: [phone] Fax: [phone]  [id number] [place], [address] [place] [address] Phone: [phone] Fax: [phone]     Social Determinants of Health (SDOH) Interventions    Readmission Risk Interventions No flowsheet data found.

## 2019-10-18 NOTE — Discharge Instructions (Signed)
Follow up with Orthopaedist as directed.  See the Orthopaedist for recheck.

## 2019-10-18 NOTE — ED Notes (Signed)
Pt eating and drinking

## 2019-10-18 NOTE — Progress Notes (Addendum)
SATURATION QUALIFICATIONS: (This note is used to comply with regulatory documentation for home oxygen)  Patient Saturations on Room Air at Rest =89-94%  Patient Saturations on Room Air while Ambulating and on exertion = 80%  Patient Saturations on 2 Liters of oxygen while Ambulating = 96%   Please briefly explain why patient needs home oxygen: Patient unable to stand for long period of time. His oxygen sat drops in low 80's while ambulating and exertion and will recover to 90-96% while resting.

## 2019-10-18 NOTE — ED Notes (Signed)
PATIENT PLACED ON OXYGEN THERAPY VIA NASAL CANNULA @ 2 LITERS O2 ,STATS CAME UP TO 95 %

## 2019-10-18 NOTE — ED Triage Notes (Signed)
Pt fell at home.  Pt fell and fractured his R humerous on Sunday.  Pt's daughter called EMS due to pt not getting out of recliner, and not eating.  Pt had follow-up appt with ortho today.  Pt unable to get there, pt has been unable to walk the past few days since the fall Sunday. Vitals BP 120/68 Pulse 75 RR 16 CBG 126 Temp 97.2 85% RA, 94% 2L O2

## 2019-10-18 NOTE — ED Notes (Signed)
Spoke with daughter([phone]) with regards to father's care plan-daughter requested patient see ortho on call because she could not get him to Ortho appointment today

## 2019-10-18 NOTE — ED Provider Notes (Signed)
Pt's Ct shows no acute disease.  02 sat 94-95 % of oxygen at rest.  Pt requesting to eat and drink.  PT felt pt would benefit from placement but Daughter had chose home care.  Social work consulted for home health   Sidney Ace 10/18/19 2015    Daleen Bo, MD 10/18/19 2350

## 2019-10-18 NOTE — ED Notes (Signed)
Pt soiled in urine upon arrival to ED.  Pt taken to room 10 to be cleaned and changed

## 2019-10-18 NOTE — ED Notes (Signed)
WITHOUT OXYGEN THERAPY PATIENT HA O2 STATS IN THE MID TO UPPER 80's

## 2019-10-18 NOTE — ED Notes (Signed)
Patient transported to CT 

## 2019-10-18 NOTE — TOC Progression Note (Addendum)
Transition of Care [facility]) - Progression Note    Patient Details  Name: Akiva Brassfield MRN: [id number] Date of Birth: 1934-03-11  Transition of [facility]) CM/SW Contact  Erenest Rasher, RN Phone Number: 10/18/2019, 4:05 PM  Clinical Narrative:    TOC CM spoke to dtr, Cecille Rubin. Offered choice for [facility]. Dtr agreeable to [facility] for [facility]. States she has RW and cane at home but will need wheelchair and bedside commode.  Will contact [place] for DME. Pt may need oxygen for home, will continue to monitor. Waiting PT recommendations.     Expected Discharge Plan: [place] Barriers to Discharge: Continued Medical Work up  Expected Discharge Plan and Services Expected Discharge Plan: [place] In-house Referral: Clinical Social Work     Living arrangements for the past 2 months: Single Family Home                                       Social Determinants of Health (SDOH) Interventions    Readmission Risk Interventions No flowsheet data found.

## 2019-10-19 ENCOUNTER — Telehealth (HOSPITAL_COMMUNITY): Payer: Self-pay | Admitting: Student

## 2019-10-19 ENCOUNTER — Telehealth: Payer: Self-pay | Admitting: Neurology

## 2019-10-19 ENCOUNTER — Telehealth: Payer: Self-pay | Admitting: *Deleted

## 2019-10-19 DIAGNOSIS — R456 Violent behavior: Secondary | ICD-10-CM | POA: Diagnosis not present

## 2019-10-19 DIAGNOSIS — Z7401 Bed confinement status: Secondary | ICD-10-CM | POA: Diagnosis not present

## 2019-10-19 DIAGNOSIS — R404 Transient alteration of awareness: Secondary | ICD-10-CM | POA: Diagnosis not present

## 2019-10-19 DIAGNOSIS — I10 Essential (primary) hypertension: Secondary | ICD-10-CM | POA: Diagnosis not present

## 2019-10-19 DIAGNOSIS — M2550 Pain in unspecified joint: Secondary | ICD-10-CM | POA: Diagnosis not present

## 2019-10-19 NOTE — ED Notes (Signed)
Called Margarita Grizzle (daughter) with update and information on discharge.

## 2019-10-19 NOTE — Telephone Encounter (Signed)
Pt evaluated by me yesterday in the ED. Needs HH orders and face to face. CSW also requesting O2 for home. Pt had been signed out pending CTA and reassessment of O2. CTA negative for acute findings. Pt's O2 improved, mid 90's on RA. Will place orders as requested by CSW.

## 2019-10-19 NOTE — Telephone Encounter (Signed)
TOC CM received call from [place] with HTA, Janene Madeira. States she will be following in the community. Gave updates on DME and HH. States she did speak to dtr. Contacted Dtr with updates and provided contact info for [employer] and [employer]. Explained Huey Romans will deliver DME this evening. (confirmed DME was dispatched)Takila Kronberg RN [place], [place] ED TOC CM [phone]

## 2019-10-19 NOTE — ED Notes (Signed)
Daughter called for an ETA, informed her it may be a little while due to EMS running behind.

## 2019-10-19 NOTE — Progress Notes (Signed)
10/19/2019 425 pm TOC CM contacted [employer] for DME oxygen, wheelchair, and 3n1 bedside commode. Will deliver to home today. Contacted [employer] rep, [place] for [facility], [facility] accepted for referral. EDP able to enter orders for DME, and HH. [place], [place] ED TOC CM [phone]

## 2019-10-19 NOTE — Progress Notes (Signed)
TOC CM contacted [employer] for DME oxygen, wheelchair, and 3n1 bedside commode. Will deliver to home today. Contacted [employer] rep, [place] for [facility], [facility] accepted for referral. EDP able to enter orders for DME, and HH. [place], [place] ED TOC CM [phone]

## 2019-10-19 NOTE — Telephone Encounter (Signed)
Daughter called needing to reschedule his NP appointment to a Virtual Visit. She said that he fell and broke his Arm last week. She also said that last Wednesday after his last Chemo treatment he had a Seizure. She said he does not get around well and now with the broke arm it's harder. Pleas Advise. Thank you

## 2019-10-20 ENCOUNTER — Telehealth: Payer: Self-pay | Admitting: Internal Medicine

## 2019-10-20 DIAGNOSIS — G3184 Mild cognitive impairment, so stated: Secondary | ICD-10-CM

## 2019-10-20 DIAGNOSIS — R0902 Hypoxemia: Secondary | ICD-10-CM | POA: Diagnosis not present

## 2019-10-20 DIAGNOSIS — C450 Mesothelioma of pleura: Secondary | ICD-10-CM

## 2019-10-20 NOTE — Telephone Encounter (Signed)
Will do. Thank you

## 2019-10-20 NOTE — Telephone Encounter (Signed)
Brandy, Please change to virtual. Thank you!

## 2019-10-20 NOTE — Telephone Encounter (Signed)
Dr. Tomi Likens,  Can this pts appt be switched to virtual?

## 2019-10-20 NOTE — Telephone Encounter (Signed)
Copied from [place] [phone]. Topic: General - Inquiry >> Oct 20, 2019  4:12 PM Virl Axe D wrote: Reason for CRM: Pt's daughter called to request order for pt to start receiving services with [facility]. Pt is also needing an order for a hospital bed from [place] [phone]). Requesting CB from [place]. CB#[phone]

## 2019-10-20 NOTE — Telephone Encounter (Signed)
Yes,that would be okay.

## 2019-10-21 DIAGNOSIS — G309 Alzheimer's disease, unspecified: Secondary | ICD-10-CM | POA: Diagnosis not present

## 2019-10-21 DIAGNOSIS — M1712 Unilateral primary osteoarthritis, left knee: Secondary | ICD-10-CM | POA: Diagnosis not present

## 2019-10-21 DIAGNOSIS — G40909 Epilepsy, unspecified, not intractable, without status epilepticus: Secondary | ICD-10-CM | POA: Diagnosis not present

## 2019-10-21 DIAGNOSIS — M5416 Radiculopathy, lumbar region: Secondary | ICD-10-CM | POA: Diagnosis not present

## 2019-10-21 DIAGNOSIS — Z7982 Long term (current) use of aspirin: Secondary | ICD-10-CM | POA: Diagnosis not present

## 2019-10-21 DIAGNOSIS — Z79891 Long term (current) use of opiate analgesic: Secondary | ICD-10-CM | POA: Diagnosis not present

## 2019-10-21 DIAGNOSIS — S42201D Unspecified fracture of upper end of right humerus, subsequent encounter for fracture with routine healing: Secondary | ICD-10-CM | POA: Diagnosis not present

## 2019-10-21 DIAGNOSIS — E785 Hyperlipidemia, unspecified: Secondary | ICD-10-CM | POA: Diagnosis not present

## 2019-10-21 DIAGNOSIS — Z86010 Personal history of colonic polyps: Secondary | ICD-10-CM | POA: Diagnosis not present

## 2019-10-21 DIAGNOSIS — R32 Unspecified urinary incontinence: Secondary | ICD-10-CM | POA: Diagnosis not present

## 2019-10-21 DIAGNOSIS — F329 Major depressive disorder, single episode, unspecified: Secondary | ICD-10-CM | POA: Diagnosis not present

## 2019-10-21 DIAGNOSIS — Z85850 Personal history of malignant neoplasm of thyroid: Secondary | ICD-10-CM | POA: Diagnosis not present

## 2019-10-21 DIAGNOSIS — Z87891 Personal history of nicotine dependence: Secondary | ICD-10-CM | POA: Diagnosis not present

## 2019-10-21 DIAGNOSIS — H3530 Unspecified macular degeneration: Secondary | ICD-10-CM | POA: Diagnosis not present

## 2019-10-21 DIAGNOSIS — I716 Thoracoabdominal aortic aneurysm, without rupture: Secondary | ICD-10-CM | POA: Diagnosis not present

## 2019-10-21 DIAGNOSIS — C450 Mesothelioma of pleura: Secondary | ICD-10-CM | POA: Diagnosis not present

## 2019-10-21 DIAGNOSIS — I10 Essential (primary) hypertension: Secondary | ICD-10-CM | POA: Diagnosis not present

## 2019-10-21 DIAGNOSIS — Z452 Encounter for adjustment and management of vascular access device: Secondary | ICD-10-CM | POA: Diagnosis not present

## 2019-10-21 DIAGNOSIS — E538 Deficiency of other specified B group vitamins: Secondary | ICD-10-CM | POA: Diagnosis not present

## 2019-10-21 DIAGNOSIS — E119 Type 2 diabetes mellitus without complications: Secondary | ICD-10-CM | POA: Diagnosis not present

## 2019-10-21 DIAGNOSIS — K219 Gastro-esophageal reflux disease without esophagitis: Secondary | ICD-10-CM | POA: Diagnosis not present

## 2019-10-21 DIAGNOSIS — S30810D Abrasion of lower back and pelvis, subsequent encounter: Secondary | ICD-10-CM | POA: Diagnosis not present

## 2019-10-21 DIAGNOSIS — E039 Hypothyroidism, unspecified: Secondary | ICD-10-CM | POA: Diagnosis not present

## 2019-10-21 DIAGNOSIS — F0280 Dementia in other diseases classified elsewhere without behavioral disturbance: Secondary | ICD-10-CM | POA: Diagnosis not present

## 2019-10-21 DIAGNOSIS — S40011D Contusion of right shoulder, subsequent encounter: Secondary | ICD-10-CM | POA: Diagnosis not present

## 2019-10-22 NOTE — Progress Notes (Signed)
Virtual Visit via Video Note The purpose of this virtual visit is to provide medical care while limiting exposure to the novel coronavirus.    Consent was obtained for video visit:  Yes.   Answered questions that patient had about telehealth interaction:  Yes.   I discussed the limitations, risks, security and privacy concerns of performing an evaluation and management service by telemedicine. I also discussed with the patient that there may be a patient responsible charge related to this service. The patient expressed understanding and agreed to proceed.  Pt location: Home Physician Location: office Name of referring provider:  [name], MD I connected with Dean Pruitt at patients initiation/request on 10/24/2019 at 12:50 PM EST by video enabled telemedicine application and verified that I am speaking with the correct person using two identifiers. Pt MRN:  [id number] Pt DOB:  [birth year] Video Participants:  [place];  His daughter   History of Present Illness:  Dean Pruitt is an 85 year old right-handed male with vascular dementia, hyperlipidemia, elevated blood pressure and history of thyroid cancer s/p thyroidectomy who follows up for recent new-onset seizure.  UPDATE: He is taking Aricept 36m at bedtime, ASA 88m gabapentin 30035mt bedtime; Celexa 69m55mily; Crestor.  Due to worsening dementia since restarting chemotherapy, MRI of brain with and without contrast was performed on 09/18/2019, which showed advanced atrophy and chronic small vessel ischemic changes but no acute abnormality.  On 09/19/2019, he was found missing from his home, along with his car.  He was later found knocking on the door of a stranger's apartment.  He was [facility] ED at [facility].  CT head showed no acute intracranial abnormality.  CBC, UA and CXR negative for infection.  Troponin and EKG showed no acute cardiac event.  No significant electrolyte  abnormalities except low magnesium, which was replaced.  He was discharged with his daughter in stable condition.  On 10/11/2019, he had a generalized tonic clonic seizure.  [facility]  [facility] head showed no acute intracranial abnormality.  He was discharged on Keppra 500mg65mce daily but he stopped due to causing agitation.  He returned to the ED after falling and fracturing his right arm.  He has oxycodone but doesn't like taking it.  He often does not make sense when he talks. He is now completely dependent.  He is only able to take medications if crushed in pudding.  A hospital bed is being delivered.  He is now on oxygen.  He also has a wheelchair and bedside commode.  Physical therapy and cognitive therapy was ordered but he is unable to participate.  Hospice has been ordered.    HISTORY: His wife started noticing problems with memory in 2016. He recognizes memory problems too, but is not as concerned as his wife. He started to become disoriented driving on familiar routes. From church, he would be unsure how to get to known restaurants. He had trouble driving to his daughter's house, as well. Once in a while, hew would misplace things, such as his checkbook. Over the span of a few months, he forgot to pay some pills three times. He has since set up auto-payments. This past year, he began to struggle with paying his taxes. He sets up his pillbox for the week every week, which works out well. He once saw a close friend from church whom he hasn't seen for several years.  He recognized him but couldn't remember his name. Sleeping is poor. He drinks 2 glasses of wine a day.   He underwent neuropsychological testing on 04/14/16. Results were overall within normal limits, however there was evidence of mild executive dysfunction, as demonstrated with abnormalities in mental flexibility and set shifting, verbal fluency and clock drawing. Although stress was  likely the primary cause of his memory problems, these findings also suggest an underlying non-amnestic mild cognitive impairment.  He underwent repeat neuropsychological testing on 03/01/18. He demonstrated interval decline in multiple domains of cognitive functioning. As he has difficulty managing finances and medications independently, he has met criteria for mild vascular dementia.   He was diagnosed with malignant mesothelioma in March 2018.  He has restarted chemo for mesothelioma in June 2020.  He seems to have increased confusion since starting chemotherapy.  He has also been dehydrated, which aggravates it.   MRI of brain from 02/26/16 revealed age-related generalized cerebral volume loss and mild nonspecific white matter changes. MRI of brain with and without contrast from 07/19/17 Showed generalized atrophy and moderate chronic small vessel ischemic changes.  He is a Programmer, systems. His mother had Alzheimer's disease.  Past Medical History: Past Medical History:  Diagnosis Date  . Cataract   . Depression    situational  . GERD (gastroesophageal reflux disease)   . Goals of care, counseling/discussion 03/20/2017  . Hyperlipidemia   . Hypothyroidism   . Reactive depression 03/20/2017  . Thyroid cancer [facility])    PMH of; on supressive therapy    Medications: Outpatient Encounter Medications as of 10/24/2019  Medication Sig  . aspirin 81 MG tablet Take 81 mg by mouth daily.  . bifidobacterium infantis (ALIGN) capsule Take 1 capsule by mouth daily.   . blood glucose meter kit and supplies KIT Use to check blood sugars daily. Dx Code-E11.9  . citalopram (CELEXA) 10 MG tablet Take 1 tablet (10 mg total) by mouth daily.  Marland Kitchen dexamethasone (DECADRON) 4 MG tablet 1 tablet p.o. twice daily the day before, day of and day after chemotherapy every 3 weeks.  . folic acid (FOLVITE) 1 MG tablet Take 1 tablet by mouth once daily  . gabapentin (NEURONTIN) 300 MG capsule TAKE 1 CAPSULE BY  MOUTH AT NIGHT  . glucose blood (COOL BLOOD GLUCOSE TEST STRIPS) test strip Use to check blood sugars daily. Dx code: E11.9  . Lancets 28G MISC Use to check blood sugars daily. Dx E11.9  . levETIRAcetam (KEPPRA) 500 MG tablet Take 1 tablet (500 mg total) by mouth 2 (two) times daily.  Marland Kitchen levothyroxine (SYNTHROID) 150 MCG tablet Take 1 tablet by mouth once daily  . lidocaine-prilocaine (EMLA) cream Apply 1 application topically as needed.  . meloxicam (MOBIC) 7.5 MG tablet Take 1 tablet by mouth once daily  . omeprazole (PRILOSEC) 20 MG capsule Take 1 capsule by mouth once daily  . rosuvastatin (CRESTOR) 20 MG tablet Take 1 tablet by mouth once daily  . [DISCONTINUED] donepezil (ARICEPT) 10 MG tablet TAKE 1 TABLET BY MOUTH AT BEDTIME  . divalproex (DEPAKOTE) 250 MG DR tablet Take 1 tablet (250 mg total) by mouth 2 (two) times daily.  Marland Kitchen oxyCODONE (ROXICODONE) 5 MG immediate release tablet Take 1 tablet (5 mg total) by mouth every 6 (six) hours as needed for severe pain. (Patient not taking: Reported on 10/24/2019)  . [DISCONTINUED] folic acid (FOLVITE) 1 MG tablet Take 1 tablet (1 mg total) by mouth daily.   No facility-administered encounter medications on  file as of 10/24/2019.     Allergies: Allergies  Allergen Reactions  . Ativan [Lorazepam] Other (See Comments)    Mental status change-"went crazy in the head"  . Fentanyl Other (See Comments)    hallucinations  . Tramadol Other (See Comments)    Altered mental status "crazy in the head'    Family History: Family History  Problem Relation Age of Onset  . Alzheimer's disease Mother   . Hypertension Mother   . Heart disease Father        CAD and angioplasty in 86s  . Cancer Father        Bladder  . Other Brother        valvular heart disease  . Healthy Child   . Diabetes Neg Hx   . Stroke Neg Hx     Social History: Social History   Socioeconomic History  . Marital status: Widowed    Spouse name: Not on file  . Number  of children: 2  . Years of education: Not on file  . Highest education level: Some college, no degree  Occupational History  . Occupation: Retired  Scientific laboratory technician  . Financial resource strain: Not on file  . Food insecurity    Worry: Not on file    Inability: Not on file  . Transportation needs    Medical: Not on file    Non-medical: Not on file  Tobacco Use  . Smoking status: Former Smoker    Packs/day: 1.00    Years: 5.00    Pack years: 5.00    Types: Cigarettes    Quit date: 12/07/1958    Years since quitting: 60.9  . Smokeless tobacco: Never Used  . Tobacco comment: smoked 1956-1960, up 1 ppd  Substance and Sexual Activity  . Alcohol use: Not Currently    Alcohol/week: 14.0 standard drinks    Types: 14 Glasses of wine per week  . Drug use: No  . Sexual activity: Not on file  Lifestyle  . Physical activity    Days per week: Not on file    Minutes per session: Not on file  . Stress: Not on file  Relationships  . Social Herbalist on phone: Not on file    Gets together: Not on file    Attends religious service: Not on file    Active member of club or organization: Not on file    Attends meetings of clubs or organizations: Not on file    Relationship status: Not on file  . Intimate partner violence    Fear of current or ex partner: Not on file    Emotionally abused: Not on file    Physically abused: Not on file    Forced sexual activity: Not on file  Other Topics Concern  . Not on file  Social History Narrative   Exercise:  Yard work, walks on occasion   Pt is widow he has 2 children   He is right handed   Drinks coffee daily, NO Tea NO soda                      Observations/Objective:   Height 6' (1.829 m), weight 200 lb (90.7 kg). No acute distress.  Drowsy.  Assessment and Plan:   1.  New-onset seizure.  Even though this was an isolated seizure, I would remain on antiepileptic therapy given the physiologic changes in his brain, which may  continue to be a trigger for  future seizures. 2.  Vascular dementia, progression complicated by chemotherapy  PLAN: 1.  Start Depakote 21m twice daily.  Check CBC and CMP a week prior to follow up. 2.  Discontinue Aricept as it may lower seizure threshold. 3.  Continue citalopram 4.  Follow up on 12/11/2018 as already scheduled.  Switch to virtual visit for convenience.  At th at time, consider starting memantine.  Time spent with patient and daughter:  25 minutes  AMetta Clines DO  CC: SBilley Gosling MD

## 2019-10-23 ENCOUNTER — Telehealth: Payer: Self-pay

## 2019-10-23 ENCOUNTER — Telehealth: Payer: Self-pay | Admitting: *Deleted

## 2019-10-23 DIAGNOSIS — L8940 Pressure ulcer of contiguous site of back, buttock and hip, unspecified stage: Secondary | ICD-10-CM

## 2019-10-23 NOTE — Telephone Encounter (Signed)
Called hospice and faxed over hospital bed orders to [facility].

## 2019-10-23 NOTE — Telephone Encounter (Signed)
LVM letting daughter know. 

## 2019-10-23 NOTE — Telephone Encounter (Signed)
Ordered both

## 2019-10-23 NOTE — Telephone Encounter (Signed)
celexa is for depression/anxiety - his neurologist ordered that.  He may or may not need that.   Not sure if he is still taking the meloxicam - that was given to him by Dr Tamala Julian for pain.  He can stop that if he does not need it.   The gabapentin is for nerve pain - he may or may not still need that.     Ideally he should continue the rest.

## 2019-10-23 NOTE — Telephone Encounter (Signed)
Daughter called to say patient is not going to be able to come for labs on Wednesday. "He is not strong enough, can barely walk, have obtained a WC, BSC and oxygen through [facility]"  Dr Quay Burow wrote an order for Hospice today.  Dr Julien Nordmann states to cancel appts at this time. Daughter notified. Appts cancelled.

## 2019-10-23 NOTE — Telephone Encounter (Signed)
Daughter wants a referral for hospice for pt.

## 2019-10-23 NOTE — Telephone Encounter (Signed)
Order printed and faxed. Daughter wants you to look over med list and let her know what is the most important meds for pt to take. She is having a hard time giving him medications.

## 2019-10-23 NOTE — Telephone Encounter (Signed)
Copied from [place] [id number]. Topic: General - Other >> Oct 23, 2019  4:38 PM Rainey Pines A wrote: Patient is having hospital bed delivered tomorrow however if order is placed patient can have the bed mattress with the extra cushion to assist patient with bed sore . Patients daughter is asking for return call from [place] at  [phone]

## 2019-10-24 ENCOUNTER — Other Ambulatory Visit: Payer: Self-pay

## 2019-10-24 ENCOUNTER — Telehealth (INDEPENDENT_AMBULATORY_CARE_PROVIDER_SITE_OTHER): Payer: PPO | Admitting: Neurology

## 2019-10-24 ENCOUNTER — Encounter: Payer: Self-pay | Admitting: Neurology

## 2019-10-24 VITALS — Ht 72.0 in | Wt 200.0 lb

## 2019-10-24 DIAGNOSIS — R569 Unspecified convulsions: Secondary | ICD-10-CM

## 2019-10-24 DIAGNOSIS — F0150 Vascular dementia without behavioral disturbance: Secondary | ICD-10-CM

## 2019-10-24 MED ORDER — DIVALPROEX SODIUM 250 MG PO DR TAB: 250.0000 mg | DELAYED_RELEASE_TABLET | Freq: Two times a day (BID) | ORAL | 3 refills | Status: AC

## 2019-10-24 NOTE — Patient Instructions (Signed)
1.  Stop donepezil 2.  Start Depakote 250mg  twice daily.   3.  Check CBC and CMP a week prior to follow up 4.  Follow up on 12/11/2018 as scheduled.

## 2019-10-24 NOTE — Telephone Encounter (Signed)
Pts daughter aware of response below.

## 2019-10-25 ENCOUNTER — Inpatient Hospital Stay: Payer: PPO

## 2019-10-27 ENCOUNTER — Ambulatory Visit (HOSPITAL_COMMUNITY): Payer: PPO

## 2019-10-27 ENCOUNTER — Telehealth: Payer: Self-pay

## 2019-10-27 NOTE — Telephone Encounter (Signed)
LVM for [name] Comment to call back in regards.

## 2019-10-27 NOTE — Telephone Encounter (Signed)
Copied from [place] [id number]. Topic: General - Other >> Oct 25, 2019  3:30 PM Yvette Rack wrote: Reason for CRM: Mendel Ryder with [facility] called to request to speak with Dr. Quay Burow' nurse regarding pt being given IV fluids in the home. Cb# [phone]

## 2019-10-31 ENCOUNTER — Telehealth: Payer: Self-pay

## 2019-10-31 NOTE — Telephone Encounter (Signed)
Received dc from [facility].  DC is for cremation and a patient of Doctor Burns.  DC will be taken to [facility].  On 10/31/2019 Received dc back from Doctor Burns. I called the funeral home to let them know the dc is ready for pickup and also faxed a copy to the funeral home per their request.

## 2019-11-01 ENCOUNTER — Ambulatory Visit: Payer: PPO

## 2019-11-01 ENCOUNTER — Other Ambulatory Visit: Payer: PPO

## 2019-11-01 ENCOUNTER — Ambulatory Visit: Payer: PPO | Admitting: Internal Medicine

## 2019-11-07 DEATH — deceased

## 2019-11-08 ENCOUNTER — Other Ambulatory Visit: Payer: PPO

## 2019-11-15 ENCOUNTER — Other Ambulatory Visit: Payer: PPO

## 2019-11-23 ENCOUNTER — Ambulatory Visit: Payer: PPO | Admitting: Internal Medicine

## 2019-11-23 ENCOUNTER — Ambulatory Visit: Payer: PPO

## 2019-11-23 ENCOUNTER — Other Ambulatory Visit: Payer: PPO

## 2019-12-06 ENCOUNTER — Telehealth: Payer: Self-pay

## 2019-12-06 NOTE — Telephone Encounter (Signed)
Dtr left message that pt died last month.

## 2019-12-06 NOTE — Telephone Encounter (Signed)
Copied from [place] [id number]. Topic: Quick Communication - See Telephone Encounter >> Dec 06, 2019  3:49 PM Loma [place] wrote: CRM for notification. See Telephone encounter for: 12/06/19. Pt daughter wanted to make sure to let Dr Quay Burow be aware of her dad's passing on 11/22

## 2019-12-12 ENCOUNTER — Telehealth: Payer: PPO | Admitting: Neurology

## 2020-03-18 ENCOUNTER — Ambulatory Visit: Payer: PPO | Admitting: Internal Medicine

## 2023-02-17 NOTE — Telephone Encounter (Signed)
done
